# Patient Record
Sex: Female | Born: 1950 | Race: White | Hispanic: No | Marital: Married | Smoking: Never smoker
Health system: Southern US, Community
[De-identification: ages and names within clinical notes are randomized; demographics above are authoritative.]

## PROBLEM LIST (undated history)

## (undated) DIAGNOSIS — T4145XA Adverse effect of unspecified anesthetic, initial encounter: Secondary | ICD-10-CM

## (undated) DIAGNOSIS — Z5111 Encounter for antineoplastic chemotherapy: Secondary | ICD-10-CM

## (undated) DIAGNOSIS — T8859XA Other complications of anesthesia, initial encounter: Secondary | ICD-10-CM

## (undated) DIAGNOSIS — J3089 Other allergic rhinitis: Secondary | ICD-10-CM

## (undated) DIAGNOSIS — N83209 Unspecified ovarian cyst, unspecified side: Secondary | ICD-10-CM

## (undated) DIAGNOSIS — B370 Candidal stomatitis: Secondary | ICD-10-CM

## (undated) DIAGNOSIS — F419 Anxiety disorder, unspecified: Secondary | ICD-10-CM

## (undated) DIAGNOSIS — Z9889 Other specified postprocedural states: Secondary | ICD-10-CM

## (undated) DIAGNOSIS — I7300 Raynaud's syndrome without gangrene: Secondary | ICD-10-CM

## (undated) DIAGNOSIS — M1990 Unspecified osteoarthritis, unspecified site: Secondary | ICD-10-CM

## (undated) DIAGNOSIS — R05 Cough: Principal | ICD-10-CM

## (undated) DIAGNOSIS — Z8489 Family history of other specified conditions: Secondary | ICD-10-CM

## (undated) DIAGNOSIS — R112 Nausea with vomiting, unspecified: Secondary | ICD-10-CM

## (undated) DIAGNOSIS — C801 Malignant (primary) neoplasm, unspecified: Secondary | ICD-10-CM

## (undated) HISTORY — DX: Candidal stomatitis: B37.0

## (undated) HISTORY — DX: Cough: R05

## (undated) HISTORY — DX: Encounter for antineoplastic chemotherapy: Z51.11

## (undated) HISTORY — PX: BREAST EXCISIONAL BIOPSY: SUR124

## (undated) HISTORY — PX: OTHER SURGICAL HISTORY: SHX169

---

## 2007-10-05 ENCOUNTER — Ambulatory Visit (HOSPITAL_COMMUNITY): Admission: RE | Admit: 2007-10-05 | Discharge: 2007-10-05 | Payer: Self-pay | Admitting: Family Medicine

## 2008-03-17 ENCOUNTER — Other Ambulatory Visit: Admission: RE | Admit: 2008-03-17 | Discharge: 2008-03-17 | Payer: Self-pay | Admitting: Family Medicine

## 2008-10-06 ENCOUNTER — Ambulatory Visit (HOSPITAL_COMMUNITY): Admission: RE | Admit: 2008-10-06 | Discharge: 2008-10-06 | Payer: Self-pay | Admitting: Family Medicine

## 2009-07-25 ENCOUNTER — Encounter: Admission: RE | Admit: 2009-07-25 | Discharge: 2009-07-25 | Payer: Self-pay | Admitting: Family Medicine

## 2009-10-21 ENCOUNTER — Ambulatory Visit (HOSPITAL_COMMUNITY): Admission: RE | Admit: 2009-10-21 | Discharge: 2009-10-21 | Payer: Self-pay | Admitting: Family Medicine

## 2009-12-29 ENCOUNTER — Encounter: Admission: RE | Admit: 2009-12-29 | Discharge: 2010-03-11 | Payer: Self-pay | Admitting: Family Medicine

## 2010-03-29 ENCOUNTER — Other Ambulatory Visit: Admission: RE | Admit: 2010-03-29 | Discharge: 2010-03-29 | Payer: Self-pay | Admitting: Family Medicine

## 2010-07-04 ENCOUNTER — Encounter: Payer: Self-pay | Admitting: Geriatric Medicine

## 2010-09-23 ENCOUNTER — Other Ambulatory Visit (HOSPITAL_COMMUNITY): Payer: Self-pay | Admitting: Family Medicine

## 2010-09-23 DIAGNOSIS — Z1231 Encounter for screening mammogram for malignant neoplasm of breast: Secondary | ICD-10-CM

## 2010-11-03 ENCOUNTER — Ambulatory Visit (HOSPITAL_COMMUNITY): Payer: BC Managed Care – PPO

## 2010-11-11 ENCOUNTER — Ambulatory Visit (HOSPITAL_COMMUNITY)
Admission: RE | Admit: 2010-11-11 | Discharge: 2010-11-11 | Disposition: A | Discharge status: Home / Self Care | Payer: BC Managed Care – PPO | Source: Ambulatory Visit | Attending: Family Medicine | Admitting: Family Medicine

## 2010-11-11 DIAGNOSIS — Z1231 Encounter for screening mammogram for malignant neoplasm of breast: Secondary | ICD-10-CM

## 2010-11-17 ENCOUNTER — Ambulatory Visit: Payer: BC Managed Care – PPO | Attending: Family Medicine | Admitting: Physical Therapy

## 2010-11-17 DIAGNOSIS — M25519 Pain in unspecified shoulder: Secondary | ICD-10-CM | POA: Insufficient documentation

## 2010-11-17 DIAGNOSIS — M6281 Muscle weakness (generalized): Secondary | ICD-10-CM | POA: Insufficient documentation

## 2010-11-17 DIAGNOSIS — IMO0001 Reserved for inherently not codable concepts without codable children: Secondary | ICD-10-CM | POA: Insufficient documentation

## 2010-11-17 DIAGNOSIS — M25619 Stiffness of unspecified shoulder, not elsewhere classified: Secondary | ICD-10-CM | POA: Insufficient documentation

## 2010-11-22 ENCOUNTER — Ambulatory Visit: Payer: BC Managed Care – PPO | Admitting: Physical Therapy

## 2010-11-24 ENCOUNTER — Ambulatory Visit: Payer: BC Managed Care – PPO | Admitting: Physical Therapy

## 2010-12-03 ENCOUNTER — Ambulatory Visit: Payer: BC Managed Care – PPO | Admitting: Physical Therapy

## 2010-12-07 ENCOUNTER — Ambulatory Visit: Payer: BC Managed Care – PPO | Admitting: Physical Therapy

## 2010-12-10 ENCOUNTER — Encounter: Payer: BC Managed Care – PPO | Admitting: Physical Therapy

## 2010-12-30 ENCOUNTER — Other Ambulatory Visit: Payer: Self-pay | Admitting: Orthopedic Surgery

## 2010-12-30 DIAGNOSIS — M25511 Pain in right shoulder: Secondary | ICD-10-CM

## 2011-01-01 ENCOUNTER — Ambulatory Visit
Admission: RE | Admit: 2011-01-01 | Discharge: 2011-01-01 | Disposition: A | Discharge status: Home / Self Care | Payer: BC Managed Care – PPO | Source: Ambulatory Visit | Attending: Orthopedic Surgery | Admitting: Orthopedic Surgery

## 2011-01-01 DIAGNOSIS — M25511 Pain in right shoulder: Secondary | ICD-10-CM

## 2011-10-10 ENCOUNTER — Other Ambulatory Visit (HOSPITAL_COMMUNITY): Payer: Self-pay | Admitting: Family Medicine

## 2011-10-10 DIAGNOSIS — Z1231 Encounter for screening mammogram for malignant neoplasm of breast: Secondary | ICD-10-CM

## 2011-11-14 ENCOUNTER — Ambulatory Visit (HOSPITAL_COMMUNITY)
Admission: RE | Admit: 2011-11-14 | Discharge: 2011-11-14 | Disposition: A | Discharge status: Home / Self Care | Payer: BC Managed Care – PPO | Source: Ambulatory Visit | Attending: Family Medicine | Admitting: Family Medicine

## 2011-11-14 DIAGNOSIS — Z1231 Encounter for screening mammogram for malignant neoplasm of breast: Secondary | ICD-10-CM | POA: Insufficient documentation

## 2011-12-27 ENCOUNTER — Other Ambulatory Visit: Payer: Self-pay | Admitting: Dermatology

## 2012-03-21 ENCOUNTER — Other Ambulatory Visit: Payer: Self-pay | Admitting: Dermatology

## 2012-08-24 ENCOUNTER — Other Ambulatory Visit: Payer: Self-pay | Admitting: Gastroenterology

## 2012-08-27 ENCOUNTER — Encounter (HOSPITAL_COMMUNITY): Payer: Self-pay | Admitting: *Deleted

## 2012-08-28 ENCOUNTER — Encounter (HOSPITAL_COMMUNITY): Payer: Self-pay | Admitting: Pharmacy Technician

## 2012-09-03 ENCOUNTER — Ambulatory Visit (HOSPITAL_COMMUNITY)
Admission: RE | Admit: 2012-09-03 | Payer: BC Managed Care – PPO | Source: Ambulatory Visit | Admitting: Gastroenterology

## 2012-09-03 HISTORY — DX: Unspecified osteoarthritis, unspecified site: M19.90

## 2012-09-03 SURGERY — COLONOSCOPY WITH PROPOFOL
Anesthesia: Monitor Anesthesia Care

## 2012-09-11 ENCOUNTER — Other Ambulatory Visit: Payer: Self-pay | Admitting: Gastroenterology

## 2012-09-12 ENCOUNTER — Encounter (HOSPITAL_COMMUNITY): Payer: Self-pay | Admitting: *Deleted

## 2012-09-19 ENCOUNTER — Encounter (HOSPITAL_COMMUNITY): Payer: Self-pay | Admitting: Pharmacy Technician

## 2012-09-27 ENCOUNTER — Ambulatory Visit (HOSPITAL_COMMUNITY): Payer: BC Managed Care – PPO | Admitting: Anesthesiology

## 2012-09-27 ENCOUNTER — Encounter (HOSPITAL_COMMUNITY)
Admission: RE | Disposition: A | Discharge status: Home / Self Care | Payer: Self-pay | Source: Ambulatory Visit | Attending: Gastroenterology

## 2012-09-27 ENCOUNTER — Ambulatory Visit (HOSPITAL_COMMUNITY)
Admission: RE | Admit: 2012-09-27 | Discharge: 2012-09-27 | Disposition: A | Discharge status: Home / Self Care | Payer: BC Managed Care – PPO | Source: Ambulatory Visit | Attending: Gastroenterology | Admitting: Gastroenterology

## 2012-09-27 ENCOUNTER — Encounter (HOSPITAL_COMMUNITY): Payer: Self-pay

## 2012-09-27 ENCOUNTER — Encounter (HOSPITAL_COMMUNITY): Payer: Self-pay | Admitting: Anesthesiology

## 2012-09-27 DIAGNOSIS — Z881 Allergy status to other antibiotic agents status: Secondary | ICD-10-CM | POA: Insufficient documentation

## 2012-09-27 DIAGNOSIS — L719 Rosacea, unspecified: Secondary | ICD-10-CM | POA: Insufficient documentation

## 2012-09-27 DIAGNOSIS — Z1211 Encounter for screening for malignant neoplasm of colon: Secondary | ICD-10-CM | POA: Insufficient documentation

## 2012-09-27 DIAGNOSIS — Z880 Allergy status to penicillin: Secondary | ICD-10-CM | POA: Insufficient documentation

## 2012-09-27 DIAGNOSIS — Z884 Allergy status to anesthetic agent status: Secondary | ICD-10-CM | POA: Insufficient documentation

## 2012-09-27 DIAGNOSIS — M899 Disorder of bone, unspecified: Secondary | ICD-10-CM | POA: Insufficient documentation

## 2012-09-27 DIAGNOSIS — J309 Allergic rhinitis, unspecified: Secondary | ICD-10-CM | POA: Insufficient documentation

## 2012-09-27 DIAGNOSIS — M949 Disorder of cartilage, unspecified: Secondary | ICD-10-CM | POA: Insufficient documentation

## 2012-09-27 DIAGNOSIS — Z888 Allergy status to other drugs, medicaments and biological substances status: Secondary | ICD-10-CM | POA: Insufficient documentation

## 2012-09-27 HISTORY — DX: Other complications of anesthesia, initial encounter: T88.59XA

## 2012-09-27 HISTORY — DX: Adverse effect of unspecified anesthetic, initial encounter: T41.45XA

## 2012-09-27 HISTORY — PX: COLONOSCOPY WITH PROPOFOL: SHX5780

## 2012-09-27 SURGERY — COLONOSCOPY WITH PROPOFOL
Anesthesia: Monitor Anesthesia Care

## 2012-09-27 MED ORDER — ONDANSETRON HCL 4 MG/2ML IJ SOLN
INTRAMUSCULAR | Status: DC | PRN
  Administered 2012-09-27: 4 mg via INTRAVENOUS

## 2012-09-27 MED ORDER — SODIUM CHLORIDE 0.9 % IV SOLN: INTRAVENOUS | Status: DC

## 2012-09-27 MED ORDER — LACTATED RINGERS IV SOLN
INTRAVENOUS | Status: DC
  Administered 2012-09-27: 1000 mL via INTRAVENOUS

## 2012-09-27 MED ORDER — FENTANYL CITRATE 0.05 MG/ML IJ SOLN
INTRAMUSCULAR | Status: DC | PRN
  Administered 2012-09-27 (×2): 50 ug via INTRAVENOUS

## 2012-09-27 MED ORDER — PROMETHAZINE HCL 25 MG/ML IJ SOLN: 6.2500 mg | INTRAMUSCULAR | Status: DC | PRN

## 2012-09-27 MED ORDER — MIDAZOLAM HCL 5 MG/5ML IJ SOLN
INTRAMUSCULAR | Status: DC | PRN
  Administered 2012-09-27 (×2): 1 mg via INTRAVENOUS

## 2012-09-27 MED ORDER — METOCLOPRAMIDE HCL 5 MG/ML IJ SOLN
INTRAMUSCULAR | Status: DC | PRN
  Administered 2012-09-27: 5 mg via INTRAVENOUS

## 2012-09-27 MED ORDER — PROPOFOL 10 MG/ML IV EMUL
INTRAVENOUS | Status: DC | PRN
  Administered 2012-09-27: 100 ug/kg/min via INTRAVENOUS

## 2012-09-27 SURGICAL SUPPLY — 21 items

## 2012-09-27 NOTE — Anesthesia Postprocedure Evaluation (Signed)
Anesthesia Post Note  Patient: [name] West  Procedure(s) Performed: Procedure(s) (LRB): COLONOSCOPY WITH PROPOFOL (N/A)  Anesthesia type: MAC  Patient location: PACU  Post pain: Pain level controlled  Post assessment: Post-op Vital signs reviewed  Last Vitals:  Filed Vitals:   09/27/12 1106  BP: 94/55  Temp: 36.4 C  Resp: 12    Post vital signs: Reviewed  Level of consciousness: sedated  Complications: No apparent anesthesia complications

## 2012-09-27 NOTE — Transfer of Care (Signed)
Immediate Anesthesia Transfer of Care Note  Patient: [name] West  Procedure(s) Performed: Procedure(s) (LRB): COLONOSCOPY WITH PROPOFOL (N/A)  Patient Location: PACU  Anesthesia Type: MAC  Level of Consciousness: sedated, patient cooperative and responds to stimulaton  Airway & Oxygen Therapy: Patient Spontanous Breathing and Patient connected to face mask oxgen  Post-op Assessment: Report given to PACU RN and Post -op Vital signs reviewed and stable  Post vital signs: Reviewed and stable  Complications: No apparent anesthesia complications

## 2012-09-27 NOTE — Anesthesia Preprocedure Evaluation (Addendum)
Anesthesia Evaluation  Patient identified by MRN, date of birth, ID band Patient awake    Reviewed: Allergy & Precautions, H&P , NPO status , Patient's Chart, lab work & pertinent test results  Airway Mallampati: II TM Distance: >3 FB Neck ROM: Full    Dental  (+) Teeth Intact and Dental Advisory Given   Pulmonary neg pulmonary ROS,  breath sounds clear to auscultation  Pulmonary exam normal       Cardiovascular negative cardio ROS  Rhythm:Regular Rate:Normal     Neuro/Psych negative neurological ROS  negative psych ROS   GI/Hepatic negative GI ROS, Neg liver ROS,   Endo/Other  negative endocrine ROS  Renal/GU negative Renal ROS  negative genitourinary   Musculoskeletal  (+) Arthritis -,   Abdominal   Peds  Hematology negative hematology ROS (+)   Anesthesia Other Findings   Reproductive/Obstetrics                           Anesthesia Physical Anesthesia Plan  ASA: I  Anesthesia Plan: MAC   Post-op Pain Management:    Induction: Intravenous  Airway Management Planned: Simple Face Mask  Additional Equipment:   Intra-op Plan:   Post-operative Plan:   Informed Consent: I have reviewed the patients History and Physical, chart, labs and discussed the procedure including the risks, benefits and alternatives for the proposed anesthesia with the patient or authorized representative who has indicated his/her understanding and acceptance.   Dental advisory given  Plan Discussed with: CRNA  Anesthesia Plan Comments:         Anesthesia Quick Evaluation

## 2012-09-27 NOTE — H&P (Signed)
Procedure: Screening colonoscopy.  History: The patient is a 62 year old female born January 15, 1951. The patient is scheduled to undergo a screening colonoscopy with polypectomy to colon cancer using propofol sedation.   Medication allergies: Penicillin causes anaphylaxis. Sulfa drugs cause a rash. Cephalexin causes anaphylaxis. Lidocaine causes rash. Sudafed causes palpitations.  Chronic medications: Boniva. Nasonex. Vitamin D. Allegra. Citalopram.  Past medical and surgical history: Allergic rhinitis. Osteopenia. Rosacea. Endometrial ablation. Right shoulder surgery.  Habits: The patient has never smoked cigarettes. He consumes alcohol in moderation.  Exam: The patient is alert and lying comfortably on the endoscopy stretcher. Sclera are nonicteric. Mouth and throat appear normal. Lungs are clear to auscultation. Cardiac exam reveals a regular rhythm.  Plan: Proceed with screening colonoscopy using propofol sedation.

## 2012-09-27 NOTE — Op Note (Signed)
Procedure: Screening colonoscopy.  Endoscopist: Johnson  Premedication: Propofol administered by anesthesia  Procedure: The patient was placed in the left lateral decubitus position. Anal inspection and digital rectal exam were normal. The Pentax pediatric colonoscope was introduced into the rectum and advanced to the cecum. A normal-appearing ileocecal valve and appendiceal orifice were identified. Colonic preparation for the exam today was fair. Advancement of the colonoscope around the length of of the colon was extremely difficult due to significant loop formation.  Rectum. Normal. I was unable to retroflex the scope in the rectum to examine the distal rectum.  Sigmoid colon and descending colon. Normal.  Splenic flexure. Normal.  Transverse colon. Normal.  Hepatic flexure. Normal.  Ascending colon. Normal.  Cecum and ileocecal valve. Normal.  Assessment: Normal screening proctocolonoscopy to the cecum in a suboptimally prepped colon. The patient was unable to consume all of the Suprep.  Recommendations: Schedule repeat screening colonoscopy in 5 years.

## 2012-09-28 ENCOUNTER — Encounter (HOSPITAL_COMMUNITY): Payer: Self-pay | Admitting: Gastroenterology

## 2012-10-08 ENCOUNTER — Other Ambulatory Visit: Payer: Self-pay

## 2012-10-08 DIAGNOSIS — Z1231 Encounter for screening mammogram for malignant neoplasm of breast: Secondary | ICD-10-CM

## 2012-11-14 ENCOUNTER — Ambulatory Visit
Admission: RE | Admit: 2012-11-14 | Discharge: 2012-11-14 | Disposition: A | Discharge status: Home / Self Care | Payer: BC Managed Care – PPO | Source: Ambulatory Visit

## 2012-11-14 DIAGNOSIS — Z1231 Encounter for screening mammogram for malignant neoplasm of breast: Secondary | ICD-10-CM

## 2013-01-01 ENCOUNTER — Other Ambulatory Visit: Payer: Self-pay | Admitting: Dermatology

## 2013-09-11 ENCOUNTER — Ambulatory Visit: Payer: BC Managed Care – PPO | Attending: Family Medicine | Admitting: Physical Therapy

## 2013-09-11 DIAGNOSIS — M719 Bursopathy, unspecified: Secondary | ICD-10-CM | POA: Insufficient documentation

## 2013-09-11 DIAGNOSIS — IMO0001 Reserved for inherently not codable concepts without codable children: Secondary | ICD-10-CM | POA: Insufficient documentation

## 2013-09-11 DIAGNOSIS — R5381 Other malaise: Secondary | ICD-10-CM | POA: Insufficient documentation

## 2013-09-11 DIAGNOSIS — M25519 Pain in unspecified shoulder: Secondary | ICD-10-CM | POA: Insufficient documentation

## 2013-09-11 DIAGNOSIS — M67919 Unspecified disorder of synovium and tendon, unspecified shoulder: Secondary | ICD-10-CM | POA: Insufficient documentation

## 2013-09-20 ENCOUNTER — Encounter: Payer: BC Managed Care – PPO | Admitting: Physical Therapy

## 2013-09-20 ENCOUNTER — Ambulatory Visit: Payer: BC Managed Care – PPO | Admitting: Physical Therapy

## 2013-09-24 ENCOUNTER — Ambulatory Visit: Payer: BC Managed Care – PPO | Admitting: Physical Therapy

## 2013-09-25 ENCOUNTER — Encounter: Payer: BC Managed Care – PPO | Admitting: Physical Therapy

## 2013-09-27 ENCOUNTER — Encounter: Payer: BC Managed Care – PPO | Admitting: Physical Therapy

## 2013-10-02 ENCOUNTER — Ambulatory Visit: Payer: BC Managed Care – PPO | Admitting: Physical Therapy

## 2013-10-02 ENCOUNTER — Encounter: Payer: BC Managed Care – PPO | Admitting: Physical Therapy

## 2013-10-04 ENCOUNTER — Ambulatory Visit: Payer: BC Managed Care – PPO | Admitting: Physical Therapy

## 2013-10-04 ENCOUNTER — Encounter: Payer: BC Managed Care – PPO | Admitting: Physical Therapy

## 2013-10-08 ENCOUNTER — Ambulatory Visit: Payer: BC Managed Care – PPO | Admitting: Physical Therapy

## 2013-10-09 ENCOUNTER — Encounter: Payer: BC Managed Care – PPO | Admitting: Physical Therapy

## 2013-10-11 ENCOUNTER — Encounter: Payer: BC Managed Care – PPO | Admitting: Physical Therapy

## 2013-10-11 ENCOUNTER — Ambulatory Visit: Payer: BC Managed Care – PPO | Admitting: Physical Therapy

## 2013-10-14 ENCOUNTER — Ambulatory Visit: Payer: BC Managed Care – PPO | Admitting: Physical Therapy

## 2013-10-16 ENCOUNTER — Encounter: Payer: BC Managed Care – PPO | Admitting: Physical Therapy

## 2013-10-18 ENCOUNTER — Encounter: Payer: BC Managed Care – PPO | Admitting: Physical Therapy

## 2013-10-23 ENCOUNTER — Ambulatory Visit: Payer: BC Managed Care – PPO | Admitting: Physical Therapy

## 2013-10-24 ENCOUNTER — Other Ambulatory Visit: Payer: Self-pay

## 2013-10-24 DIAGNOSIS — Z1231 Encounter for screening mammogram for malignant neoplasm of breast: Secondary | ICD-10-CM

## 2013-11-19 ENCOUNTER — Ambulatory Visit: Payer: BC Managed Care – PPO

## 2013-11-27 ENCOUNTER — Ambulatory Visit: Payer: BC Managed Care – PPO

## 2013-12-04 ENCOUNTER — Ambulatory Visit
Admission: RE | Admit: 2013-12-04 | Discharge: 2013-12-04 | Disposition: A | Discharge status: Home / Self Care | Payer: BC Managed Care – PPO | Source: Ambulatory Visit

## 2013-12-04 DIAGNOSIS — Z1231 Encounter for screening mammogram for malignant neoplasm of breast: Secondary | ICD-10-CM

## 2014-06-13 DIAGNOSIS — C801 Malignant (primary) neoplasm, unspecified: Secondary | ICD-10-CM

## 2014-06-13 HISTORY — DX: Malignant (primary) neoplasm, unspecified: C80.1

## 2014-11-07 ENCOUNTER — Other Ambulatory Visit: Payer: Self-pay

## 2014-11-07 DIAGNOSIS — Z1231 Encounter for screening mammogram for malignant neoplasm of breast: Secondary | ICD-10-CM

## 2014-12-08 ENCOUNTER — Ambulatory Visit
Admission: RE | Admit: 2014-12-08 | Discharge: 2014-12-08 | Disposition: A | Discharge status: Home / Self Care | Payer: BC Managed Care – PPO | Source: Ambulatory Visit

## 2014-12-08 DIAGNOSIS — Z1231 Encounter for screening mammogram for malignant neoplasm of breast: Secondary | ICD-10-CM

## 2015-04-28 ENCOUNTER — Other Ambulatory Visit: Payer: Self-pay | Admitting: Family Medicine

## 2015-04-28 ENCOUNTER — Other Ambulatory Visit (HOSPITAL_COMMUNITY)
Admission: RE | Admit: 2015-04-28 | Discharge: 2015-04-28 | Disposition: A | Discharge status: Home / Self Care | Payer: BC Managed Care – PPO | Source: Ambulatory Visit | Attending: Family Medicine | Admitting: Family Medicine

## 2015-04-28 DIAGNOSIS — Z124 Encounter for screening for malignant neoplasm of cervix: Secondary | ICD-10-CM | POA: Diagnosis not present

## 2015-04-28 DIAGNOSIS — R590 Localized enlarged lymph nodes: Secondary | ICD-10-CM

## 2015-04-29 LAB — CYTOLOGY - PAP

## 2015-05-05 ENCOUNTER — Ambulatory Visit
Admission: RE | Admit: 2015-05-05 | Discharge: 2015-05-05 | Disposition: A | Discharge status: Home / Self Care | Payer: BC Managed Care – PPO | Source: Ambulatory Visit | Attending: Family Medicine | Admitting: Family Medicine

## 2015-05-05 ENCOUNTER — Other Ambulatory Visit: Payer: Self-pay | Admitting: Family Medicine

## 2015-05-05 DIAGNOSIS — R590 Localized enlarged lymph nodes: Secondary | ICD-10-CM

## 2015-06-21 ENCOUNTER — Emergency Department (HOSPITAL_BASED_OUTPATIENT_CLINIC_OR_DEPARTMENT_OTHER)
Admission: EM | Admit: 2015-06-21 | Discharge: 2015-06-21 | Disposition: A | Discharge status: Home / Self Care | Payer: BC Managed Care – PPO | Attending: Emergency Medicine | Admitting: Emergency Medicine

## 2015-06-21 ENCOUNTER — Emergency Department (HOSPITAL_BASED_OUTPATIENT_CLINIC_OR_DEPARTMENT_OTHER): Payer: BC Managed Care – PPO

## 2015-06-21 ENCOUNTER — Encounter (HOSPITAL_BASED_OUTPATIENT_CLINIC_OR_DEPARTMENT_OTHER): Payer: Self-pay | Admitting: *Deleted

## 2015-06-21 DIAGNOSIS — R0789 Other chest pain: Secondary | ICD-10-CM | POA: Insufficient documentation

## 2015-06-21 DIAGNOSIS — R079 Chest pain, unspecified: Secondary | ICD-10-CM | POA: Diagnosis present

## 2015-06-21 DIAGNOSIS — R52 Pain, unspecified: Secondary | ICD-10-CM

## 2015-06-21 DIAGNOSIS — Z880 Allergy status to penicillin: Secondary | ICD-10-CM | POA: Insufficient documentation

## 2015-06-21 DIAGNOSIS — Z8742 Personal history of other diseases of the female genital tract: Secondary | ICD-10-CM | POA: Diagnosis not present

## 2015-06-21 DIAGNOSIS — R918 Other nonspecific abnormal finding of lung field: Secondary | ICD-10-CM

## 2015-06-21 DIAGNOSIS — Z8739 Personal history of other diseases of the musculoskeletal system and connective tissue: Secondary | ICD-10-CM | POA: Insufficient documentation

## 2015-06-21 DIAGNOSIS — Z79899 Other long term (current) drug therapy: Secondary | ICD-10-CM | POA: Diagnosis not present

## 2015-06-21 HISTORY — DX: Unspecified ovarian cyst, unspecified side: N83.209

## 2015-06-21 LAB — CBC WITH DIFFERENTIAL/PLATELET
Basophils Absolute: 0 10*3/uL (ref 0.0–0.1)
Basophils Relative: 0 %
Eosinophils Absolute: 0 10*3/uL (ref 0.0–0.7)
Eosinophils Relative: 0 %
HCT: 42.1 % (ref 36.0–46.0)
Hemoglobin: 14 g/dL (ref 12.0–15.0)
Lymphocytes Relative: 23 %
Lymphs Abs: 2.2 10*3/uL (ref 0.7–4.0)
MCH: 30.4 pg (ref 26.0–34.0)
MCHC: 33.3 g/dL (ref 30.0–36.0)
MCV: 91.3 fL (ref 78.0–100.0)
Monocytes Absolute: 0.7 10*3/uL (ref 0.1–1.0)
Monocytes Relative: 7 %
Neutro Abs: 6.6 10*3/uL (ref 1.7–7.7)
Neutrophils Relative %: 70 %
Platelets: 248 10*3/uL (ref 150–400)
RBC: 4.61 MIL/uL (ref 3.87–5.11)
RDW: 12.8 % (ref 11.5–15.5)
WBC: 9.6 10*3/uL (ref 4.0–10.5)

## 2015-06-21 LAB — BASIC METABOLIC PANEL
Anion gap: 7 (ref 5–15)
BUN: 18 mg/dL (ref 6–20)
CO2: 29 mmol/L (ref 22–32)
Calcium: 9 mg/dL (ref 8.9–10.3)
Chloride: 103 mmol/L (ref 101–111)
Creatinine, Ser: 0.74 mg/dL (ref 0.44–1.00)
GFR calc Af Amer: >60 mL/min (ref >60)
GFR calc non Af Amer: >60 mL/min (ref >60)
Glucose, Bld: 101 mg/dL — ABNORMAL HIGH (ref 65–99)
Potassium: 4.2 mmol/L (ref 3.5–5.1)
Sodium: 139 mmol/L (ref 135–145)

## 2015-06-21 LAB — TROPONIN I
Troponin I: <0.03 ng/mL (ref <0.031)
Troponin I: <0.03 ng/mL (ref <0.031)

## 2015-06-21 MED ORDER — DIPHENHYDRAMINE HCL 50 MG/ML IJ SOLN
50.0000 mg | Freq: Once | INTRAMUSCULAR | Status: AC
  Administered 2015-06-21: 50 mg via INTRAVENOUS
  Filled 2015-06-21: qty 1

## 2015-06-21 MED ORDER — METHYLPREDNISOLONE SODIUM SUCC 125 MG IJ SOLR
125.0000 mg | Freq: Once | INTRAMUSCULAR | Status: AC
  Administered 2015-06-21: 125 mg via INTRAVENOUS
  Filled 2015-06-21: qty 2

## 2015-06-21 MED ORDER — IOHEXOL 300 MG/ML  SOLN
75.0000 mL | Freq: Once | INTRAMUSCULAR | Status: AC | PRN
  Administered 2015-06-21: 75 mL via INTRAVENOUS

## 2015-06-21 MED ORDER — AZITHROMYCIN 250 MG PO TABS: ORAL_TABLET | ORAL | Status: DC

## 2015-06-21 NOTE — Discharge Instructions (Signed)
Please follow with your primary care doctor in the next 2 days for a check-up. They must obtain records for further management.  ° °Do not hesitate to return to the Emergency Department for any new, worsening or concerning symptoms.  ° °

## 2015-06-21 NOTE — ED Notes (Signed)
Pt states 2/10 pain scale, and says blood pressure is normally 100's/60's at PCP

## 2015-06-21 NOTE — ED Notes (Signed)
Pt ambulated to restroom with no assistance. ?

## 2015-06-21 NOTE — ED Notes (Signed)
Pt has hx of bursitis in left shoulder and has pain with that. Reports pain in left side of chest x 1 week that is different- [place]

## 2015-06-21 NOTE — ED Provider Notes (Signed)
CSN: [id number]     Arrival date & time 06/21/15  1247 History   First MD Initiated Contact with Patient 06/21/15 1306     Chief Complaint  Patient presents with  . Chest Pain     (Consider location/radiation/quality/duration/timing/severity/associated sxs/prior Treatment) HPI   Blood pressure 129/81, pulse 78, temperature 98.4 F (36.9 C), temperature source Oral, resp. rate 18, height '4\' 11"'$  (1.499 m), weight 40.824 kg, SpO2 100 %.  [name] West is a 65 y.o. female complaining of aching 2 out of 10 diffuse anterior and midaxillary left-sided chest pain onset 1 week ago exacerbated by movement and occasionally by palpation, she took a baby aspirin 2 days ago with some relief. Patient denies fever, chills, cough, shortness of breath, dyspnea on exertion, increasing peripheral edema, exogenous estrogen, calf pain or leg swelling, treatment for cancer/chemotherapy, calf pain, leg swelling. She had a extended flight from [place] back to the [place] but this was 2 months ago. Denies history of DVT/PE, tobacco use, diabetes, hypertension, hyperlipidemia, palpitations, syncope. Pain has been constant since onset with occasional increases in severity.   Past Medical History  Diagnosis Date  . Arthritis     shoulders  . Complication of anesthesia     nausea  . Ovarian cyst    Past Surgical History  Procedure Laterality Date  . Right rotator cuff    . Cesarean section  1984  . Fibroid removed    . Colonoscopy with propofol N/A 09/27/2012    Procedure: COLONOSCOPY WITH PROPOFOL;  Surgeon: Garlan Fair, MD;  Location: [facility];  Service: Endoscopy;  Laterality: N/A;   No family history on file. Social History  Substance Use Topics  . Smoking status: Never Smoker   . Smokeless tobacco: Never Used  . Alcohol Use: Yes     Comment: wine 1 glass per night   OB History    No data available     Review of Systems  10 systems reviewed and found to be negative, except as noted in the  HPI.   Allergies  Avelox; Cephalosporins; Penicillins; Omnipaque; Doxycycline; Adhesive; Lidoderm; Pseudoephedrine; and Sulfa antibiotics  Home Medications   Prior to Admission medications   Medication Sig Start Date End Date Taking? Authorizing Provider  Azelaic Acid (FINACEA) 15 % cream Apply 1 application topically 2 (two) times daily. After skin is thoroughly washed and patted dry, gently but thoroughly massage a thin film of azelaic acid cream into the affected area twice daily, in the morning and evening.   Yes Historical Provider, MD  Brimonidine Tartrate (MIRVASO) 0.33 % GEL Apply 1 application topically daily.   Yes Historical Provider, MD  cholecalciferol (VITAMIN D) 1000 UNITS tablet Take 1,000 Units by mouth daily.   Yes Historical Provider, MD  citalopram (CELEXA) 10 MG tablet Take 10 mg by mouth every morning.   Yes Historical Provider, MD  fexofenadine (ALLEGRA) 180 MG tablet Take 180 mg by mouth daily.   Yes Historical Provider, MD  LORazepam (ATIVAN) 0.5 MG tablet Take 0.5 mg by mouth 2 (two) times daily.   Yes Historical Provider, MD  meloxicam (MOBIC) 15 MG tablet Take 15 mg by mouth daily.   Yes Historical Provider, MD  Misc Natural Products (TART CHERRY ADVANCED PO) Take by mouth.   Yes Historical Provider, MD  Multiple Vitamin (MULTIVITAMIN WITH MINERALS) TABS Take 1 tablet by mouth daily.   Yes Historical Provider, MD  zolpidem (AMBIEN) 5 MG tablet Take 5 mg by mouth at bedtime as needed  for sleep.   Yes Historical Provider, MD  azithromycin (ZITHROMAX Z-PAK) 250 MG tablet 2 po day one, then 1 daily x 4 days 06/21/15   Elmyra Ricks Pisciotta, PA-C  ibandronate (BONIVA) 150 MG tablet Take 150 mg by mouth every 30 (thirty) days. Take in the morning with a full glass of water, on an empty stomach, and do not take anything else by mouth or lie down for the next 30 min.    Historical Provider, MD  phenylephrine (SUDAFED PE) 10 MG TABS Take 10 mg by mouth every 4 (four) hours as needed  (for congestion).    Historical Provider, MD   BP 128/84 mmHg  Pulse 88  Temp(Src) 98.4 F (36.9 C) (Oral)  Resp 16  Ht '4\' 11"'$  (1.499 m)  Wt 40.824 kg  BMI 18.17 kg/m2  SpO2 98% Physical Exam  Constitutional: She is oriented to person, place, and time. She appears well-developed and well-nourished. No distress.  HENT:  Head: Normocephalic.  Mouth/Throat: Oropharynx is clear and moist.  Eyes: Conjunctivae and EOM are normal.  Neck: Normal range of motion. No JVD present. No tracheal deviation present.  Cardiovascular: Normal rate, regular rhythm and intact distal pulses.   Radial pulse equal bilaterally  Pulmonary/Chest: Effort normal and breath sounds normal. No stridor. No respiratory distress. She has no wheezes. She has no rales. She exhibits no tenderness.  Abdominal: Soft. She exhibits no distension and no mass. There is no tenderness. There is no rebound and no guarding.  Musculoskeletal: Normal range of motion. She exhibits no edema or tenderness.  No calf asymmetry, superficial collaterals, palpable cords, edema, Homans sign negative bilaterally.    Neurological: She is alert and oriented to person, place, and time.  Skin: Skin is warm. She is not diaphoretic.  Psychiatric: She has a normal mood and affect.  Nursing note and vitals reviewed.   ED Course  Procedures (including critical care time) Labs Review Labs Reviewed  BASIC METABOLIC PANEL - Abnormal; Notable for the following:    Glucose, Bld 101 (*)    All other components within normal limits  CBC WITH DIFFERENTIAL/PLATELET  TROPONIN I  TROPONIN I    Imaging Review Dg Chest 2 View  06/21/2015  CLINICAL DATA:  Left-sided chest pain for 1 week EXAM: CHEST  2 VIEW COMPARISON:  None. FINDINGS: The heart size and vascular pattern are normal. There is no consolidation or effusion. Rounded 19 mm opacity inferior to the articulation of the right first rib and sternum, possibly related to bone hypertrophy. Nodule or  mass not excluded. Mild right and moderate left shoulder arthropathy. IMPRESSION: Consider CT thorax to evaluate possible right upper lobe mass. Electronically Signed   By: Skipper Cliche M.D.   On: 06/21/2015 14:45   Ct Chest W Contrast  06/21/2015  CLINICAL DATA:  Left-sided chest pain.  Abnormal chest radiography. EXAM: CT CHEST WITH CONTRAST TECHNIQUE: Multidetector CT imaging of the chest was performed during intravenous contrast administration. CONTRAST:  24m OMNIPAQUE IOHEXOL 300 MG/ML  SOLN COMPARISON:  Chest radiograph 06/21/2015 FINDINGS: Mediastinum/Lymph Nodes: No masses, pathologically enlarged lymph nodes, or other significant abnormality. Lungs/Pleura: There is a 3.7 x 4.5 x 4.0 cm sub solid pulmonary mass versus area of airspace consolidation in the right upper lobe. Spiculations are seen extending to the anterior and lateral pleural surfaces. Prominent air bronchograms are seen within this finding. Retraction of the interlobar fissure is also seen. The lobar and segmental bronchi are patent. The major vascular structures are normal  in caliber. There is a 6 mm circumscribed soft tissue nodule in the right apex, which may represent an intraparenchymal lymph node. The left lung is clear. Upper abdomen: No acute findings. Musculoskeletal: No chest wall mass or suspicious bone lesions identified. Osteoarthritic changes of bilateral glenohumeral joints with subchondral sclerosis and cyst formation are seen. IMPRESSION: Right upper lobe mixed solid and sub solid mass, causing interlobar fissure retraction and spiculations extending to the pleural surfaces. Differential diagnosis includes primary pulmonary malignancy, with adjacent airspace consolidation versus less likely focal pneumonia. If patient does not demonstrate clinical signs of pneumonia, tissue diagnosis may be considered. 6 mm smoothly marginated soft tissue nodule in the right apex, which may represent an intraparenchymal lymph node.  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 Electronically Signed   By: Fidela Salisbury M.D.   On: 06/21/2015 16:57   I have personally reviewed and evaluated these images and lab results as part of my medical decision-making.   EKG Interpretation None      MDM   Final diagnoses:  Pain  Lung mass  Atypical chest pain    Filed Vitals:   06/21/15 1250 06/21/15 1526 06/21/15 1728 06/21/15 1729  BP: 129/81 135/76 128/84 128/84  Pulse: 78 84 90 88  Temp: 98.4 F (36.9 C)     TempSrc: Oral     Resp: '18 18  16  '$ Height: '4\' 11"'$  (1.499 m)     Weight: 40.824 kg     SpO2: 100% 100% 98% 98%    Medications  methylPREDNISolone sodium succinate (SOLU-MEDROL) 125 mg/2 mL injection 125 mg (125 mg Intravenous Given 06/21/15 1515)  diphenhydrAMINE (BENADRYL) injection 50 mg (50 mg Intravenous Given 06/21/15 1515)  iohexol (OMNIPAQUE) 300 MG/ML solution 75 mL (75 mLs Intravenous Contrast Given 06/21/15 1619)    Denajah Farias is 65 y.o. female presenting with left sided chest pain constant over the course of a week. States is exacerbated with left shoulder movement. There was no trauma. Lung sounds clear to auscultation, patient saturating well on room air, there is no tachypnea or tachycardia. Chest is nontender to palpation. EKG with no ischemic changes. Very low risk factors for PE, history of present illness atypical for PE, I doubt PE.   Chest x-ray with a 19 mm opacity on the right first rib and sternum. This is not in the area of this patient's pain however, would like to further evaluated with CT. Patient had hive-like reaction to contrast in the past. Patient premedicated with Solu-Medrol and Benadryl.  Delta troponin is negative.  Chest CT shows a 4 cm sub-solid pulmonary mass in the right upper lobe. Suspicion for pulmonary malignancy versus, less likely, pneumonia. Discussed findings with patient. Hematology oncology consult from Dr. Lindi Adie appreciated: States he  will call the patient tomorrow to expedite PET, and biopsy this week.   Evaluation does not show pathology that would require ongoing emergent intervention or inpatient treatment. Pt is hemodynamically stable and mentating appropriately. Discussed findings and plan with patient/guardian, who agrees with care plan. All questions answered. Return precautions discussed and outpatient follow up given.   New Prescriptions   AZITHROMYCIN (ZITHROMAX Z-PAK) 250 MG TABLET    2 po day one, then 1 daily x 4 days         Monico Blitz, PA-C 06/21/15 [place], DO 06/21/15 2331

## 2015-06-22 ENCOUNTER — Telehealth: Payer: Self-pay | Admitting: Emergency Medicine

## 2015-06-22 ENCOUNTER — Other Ambulatory Visit: Payer: Self-pay

## 2015-06-22 DIAGNOSIS — R918 Other nonspecific abnormal finding of lung field: Secondary | ICD-10-CM

## 2015-06-22 NOTE — Telephone Encounter (Signed)
I was contacted by Dr Lindi Adie with Oncology regarding patient's abnormal CT chest done in the ED on 06/21/15. There is a semi-solid RUL opacity, etiology unclear, but concerning for malignancy. Also with a hypodense area (? of lymphadenopathy) in subcarinal region. Review of the ED notes indicates that she has not had any infectious sx or findings to support CAP. A PET has been ordered for 06/25/15. I would like to see her ASAP after the PET to determine best modality for biopsy. Will call radiology and get the Ct scan converted to SuperD cuts.

## 2015-06-22 NOTE — Telephone Encounter (Signed)
Pt now calling her doc said she could call us home # [phone]   [phone]

## 2015-06-22 NOTE — Telephone Encounter (Signed)
lmtcb x1 for [facility].

## 2015-06-23 NOTE — Telephone Encounter (Signed)
Spoke with [facility]'s CT department. They are going to make the Super D disc. RB will have to go and pick it up, he is aware of this. Pt has been scheduled for her consult on 06/30/15 at 9:45am. Nothing further was needed at this time.

## 2015-06-24 ENCOUNTER — Telehealth: Payer: Self-pay | Admitting: *Deleted

## 2015-06-24 ENCOUNTER — Encounter: Payer: Self-pay | Admitting: *Deleted

## 2015-06-24 DIAGNOSIS — C3491 Malignant neoplasm of unspecified part of right bronchus or lung: Secondary | ICD-10-CM | POA: Insufficient documentation

## 2015-06-24 DIAGNOSIS — R918 Other nonspecific abnormal finding of lung field: Secondary | ICD-10-CM

## 2015-06-24 NOTE — Telephone Encounter (Signed)
Call received from "Sarah with [facility] asking Dr. Lindi Adie to sign order for tomorrow's 0830 scan.  We've sent this to his in-basket and it still doesn't have the pin or check that it's been reviewed."      Voicemail with this request left on 07-724 however at this time patient is scheduled as new patient for Dr. Julien Nordmann.  Lung Navigator notified via voicemail.

## 2015-06-24 NOTE — Telephone Encounter (Signed)
Oncology Nurse Navigator Documentation  Oncology Nurse Navigator Flowsheets 06/24/2015  Navigator Location [facility]  Navigator Encounter Type Introductory phone call/I received referral.  I spoke with Dr. Julien Nordmann and he said can see patient Friday after her PET scan.  I called and left her a vm message regarding appt time and place, 07/03/15 arrive at 8:30.  I also left my name and phone number to call if needed.   Treatment Phase Pre-Tx/Tx Discussion  Barriers/Navigation Needs Coordination of Care  Interventions Coordination of Care  Coordination of Care Appts  Acuity Level 1  Time Spent with Patient 15

## 2015-06-24 NOTE — Telephone Encounter (Signed)
I called [facility] to get clarification on needs.  I spoke with out patient scan facility as well as in patient and they were unaware of issues.  I called [facility] scheduling to follow up with them.  I instructed them to call Dr. Agustina Caroli office.  I also notified his office.

## 2015-06-25 ENCOUNTER — Ambulatory Visit
Admission: RE | Admit: 2015-06-25 | Discharge: 2015-06-25 | Disposition: A | Discharge status: Home / Self Care | Payer: BC Managed Care – PPO | Source: Ambulatory Visit | Attending: Hematology and Oncology | Admitting: Hematology and Oncology

## 2015-06-25 DIAGNOSIS — N83202 Unspecified ovarian cyst, left side: Secondary | ICD-10-CM | POA: Diagnosis not present

## 2015-06-25 DIAGNOSIS — R918 Other nonspecific abnormal finding of lung field: Secondary | ICD-10-CM | POA: Diagnosis not present

## 2015-06-25 LAB — GLUCOSE, CAPILLARY: Glucose-Capillary: 87 mg/dL (ref 65–99)

## 2015-06-25 MED ORDER — FLUDEOXYGLUCOSE F - 18 (FDG) INJECTION
12.4600 | Freq: Once | INTRAVENOUS | Status: AC | PRN
  Administered 2015-06-25: 12.46 via INTRAVENOUS

## 2015-06-25 NOTE — Assessment & Plan Note (Signed)
PET-CT 06/24/14: RUL mass without hilar and mediastinal lymphadenopathy CT chest 06/21/15: RUL mass 3.7 x 4.5 x 4 cm with spiculations, 6 mm intraparenchymal LN at Rt apex.  Radiology Review: I reviewed the scans and provided copies of the reports.  Recommendation: 1. Bronchoscopy and biopsy 2. Follow up after biopsy

## 2015-06-26 ENCOUNTER — Encounter: Payer: Self-pay | Admitting: Hematology and Oncology

## 2015-06-26 ENCOUNTER — Ambulatory Visit (HOSPITAL_BASED_OUTPATIENT_CLINIC_OR_DEPARTMENT_OTHER): Payer: BC Managed Care – PPO | Admitting: Hematology and Oncology

## 2015-06-26 VITALS — BP 120/79 | HR 77 | Temp 97.8°F | Resp 17 | Ht 59.0 in | Wt 91.8 lb

## 2015-06-26 DIAGNOSIS — R918 Other nonspecific abnormal finding of lung field: Secondary | ICD-10-CM | POA: Diagnosis not present

## 2015-06-26 NOTE — Progress Notes (Signed)
Unable to get in to room prior to MD visit - no assessment performed.  Medications and allergies reconciled form pt med list provided by MD.

## 2015-06-26 NOTE — Progress Notes (Signed)
Central CONSULT NOTE  Patient Care Team: [name], MD as PCP - General (Family Medicine)  CHIEF COMPLAINTS/PURPOSE OF CONSULTATION:  Newly diagnosed lung mass  HISTORY OF PRESENTING ILLNESS:  [name] West 65 y.o. female is here because of recent diagnosis of right upper lobe lung mass. Patient went to the emergency room complaining of left-sided chest discomfort and chest tightness and underwent a CT of the chest. Incidentally there was a right upper lobe lung lesion with honeycombed appearance that was noted. The emergency room called me regarding her and I arranged an outpatient evaluation with a PET CT scan and an outpatient pulmonary consultation. She is here today to discuss the results of the PET CT scan and to discuss the overall treatment plan. Patient is not a smoker and does not have any exposure to secondhand smoke. She works at [employer] and apparently travels significantly. In November she was in [place] and prior to that in other countries as well. She has not been feeling ill or sick. She denies any cough or expectoration or hemoptysis. She denies any fevers or chills.  I reviewed her records extensively and collaborated the history with the patient.  MEDICAL HISTORY:  Past Medical History  Diagnosis Date  . Arthritis     shoulders  . Complication of anesthesia     nausea  . Ovarian cyst     SURGICAL HISTORY: Past Surgical History  Procedure Laterality Date  . Right rotator cuff    . Cesarean section  1984  . Fibroid removed    . Colonoscopy with propofol N/A 09/27/2012    Procedure: COLONOSCOPY WITH PROPOFOL;  Surgeon: Garlan Fair, MD;  Location: [facility];  Service: Endoscopy;  Laterality: N/A;    SOCIAL HISTORY: Social History   Social History  . Marital Status: Married    Spouse Name: N/A  . Number of Children: N/A  . Years of Education: N/A   Occupational History  . Not on file.   Social History Main Topics  . Smoking status:  Never Smoker   . Smokeless tobacco: Never Used  . Alcohol Use: Yes     Comment: wine 1 glass per night  . Drug Use: No  . Sexual Activity: Not on file   Other Topics Concern  . Not on file   Social History Narrative    FAMILY HISTORY: No family history on file.  ALLERGIES:  is allergic to avelox; cephalosporins; penicillins; omnipaque; doxycycline; adhesive; lidoderm; pseudoephedrine; and sulfa antibiotics.  MEDICATIONS:  Current Outpatient Prescriptions  Medication Sig Dispense Refill  . Azelaic Acid (FINACEA) 15 % cream Apply 1 application topically 2 (two) times daily. After skin is thoroughly washed and patted dry, gently but thoroughly massage a thin film of azelaic acid cream into the affected area twice daily, in the morning and evening.    Marland Kitchen azithromycin (ZITHROMAX Z-PAK) 250 MG tablet 2 po day one, then 1 daily x 4 days 5 tablet 0  . Brimonidine Tartrate (MIRVASO) 0.33 % GEL Apply 1 application topically daily.    . cholecalciferol (VITAMIN D) 1000 UNITS tablet Take 1,000 Units by mouth daily.    . citalopram (CELEXA) 10 MG tablet Take 10 mg by mouth every morning.    . fexofenadine (ALLEGRA) 180 MG tablet Take 180 mg by mouth daily.    Marland Kitchen LORazepam (ATIVAN) 0.5 MG tablet Take 0.5 mg by mouth 2 (two) times daily.    . meloxicam (MOBIC) 15 MG tablet Take 15 mg by  mouth daily.    . Misc Natural Products (TART CHERRY ADVANCED PO) Take by mouth.    . Multiple Vitamin (MULTIVITAMIN WITH MINERALS) TABS Take 1 tablet by mouth daily.    Marland Kitchen zolpidem (AMBIEN) 5 MG tablet Take 5 mg by mouth at bedtime as needed for sleep.    Marland Kitchen ibandronate (BONIVA) 150 MG tablet Take 150 mg by mouth every 30 (thirty) days. Reported on 06/26/2015    . phenylephrine (SUDAFED PE) 10 MG TABS Take 10 mg by mouth every 4 (four) hours as needed (for congestion). Reported on 06/26/2015     No current facility-administered medications for this visit.    REVIEW OF SYSTEMS:   Constitutional: Denies fevers,  chills or abnormal night sweats Eyes: Denies blurriness of vision, double vision or watery eyes Ears, nose, mouth, throat, and face: Denies mucositis or sore throat Respiratory: Denies cough, dyspnea or wheezes Cardiovascular: Denies palpitation, chest discomfort or lower extremity swelling Gastrointestinal:  Denies nausea, heartburn or change in bowel habits Skin: Denies abnormal skin rashes Lymphatics: Denies new lymphadenopathy or easy bruising Neurological:Denies numbness, tingling or new weaknesses Behavioral/Psych: anxiety and stress related to this lung findings  All other systems were reviewed with the patient and are negative.  PHYSICAL EXAMINATION: ECOG PERFORMANCE STATUS: 1 - Symptomatic but completely ambulatory  Filed Vitals:   06/26/15 1211  BP: 120/79  Pulse: 77  Temp: 97.8 F (36.6 C)  Resp: 17   Filed Weights   06/26/15 1211  Weight: 91 lb 12.8 oz (41.64 kg)    GENERAL:alert, no distress and comfortable SKIN: skin color, texture, turgor are normal, no rashes or significant lesions EYES: normal, conjunctiva are pink and non-injected, sclera clear OROPHARYNX:no exudate, no erythema and lips, buccal mucosa, and tongue normal  NECK: supple, thyroid normal size, non-tender, without nodularity LYMPH:  no palpable lymphadenopathy in the cervical, axillary or inguinal LUNGS: clear to auscultation and percussion with normal breathing effort HEART: regular rate & rhythm and no murmurs and no lower extremity edema ABDOMEN:abdomen soft, non-tender and normal bowel sounds Musculoskeletal:no cyanosis of digits and no clubbing  PSYCH: alert & oriented x 3 with fluent speech NEURO: no focal motor/sensory deficits  LABORATORY DATA:  I have reviewed the data as listed Lab Results  Component Value Date   WBC 9.6 06/21/2015   HGB 14.0 06/21/2015   HCT 42.1 06/21/2015   MCV 91.3 06/21/2015   PLT 248 06/21/2015   Lab Results  Component Value Date   NA 139 06/21/2015    K 4.2 06/21/2015   CL 103 06/21/2015   CO2 29 06/21/2015    RADIOGRAPHIC STUDIES: I have personally reviewed the radiological reports and agreed with the findings in the report.  ASSESSMENT AND PLAN:  Lung mass PET-CT 06/24/14: RUL mass without hilar and mediastinal lymphadenopathy CT chest 06/21/15: RUL mass 3.7 x 4.5 x 4 cm with spiculations, 6 mm intraparenchymal LN at Rt apex.  Radiology Review: I reviewed the scans and provided copies of the reports.  Recommendation: 1. Bronchoscopy and biopsy 2. Follow up after biopsy  I discussed with her that the differential diagnosis is between a infection/inflammation/cancer. If it is lung malignancy, we will refer her for cardiothoracic surgery for further evaluation regarding removal of the mass. If it is infection, Dr. Lamonte Sakai will be able to figure out a game plan for treatment of the infection.  Return to clinic based upon the pathology results. All questions were answered. The patient knows to call the clinic with any  problems, questions or concerns.    Rulon Eisenmenger, MD 06/26/2015

## 2015-06-30 ENCOUNTER — Encounter: Payer: Self-pay | Admitting: Emergency Medicine

## 2015-06-30 ENCOUNTER — Ambulatory Visit (INDEPENDENT_AMBULATORY_CARE_PROVIDER_SITE_OTHER): Payer: BC Managed Care – PPO | Admitting: Emergency Medicine

## 2015-06-30 ENCOUNTER — Other Ambulatory Visit (INDEPENDENT_AMBULATORY_CARE_PROVIDER_SITE_OTHER): Payer: BC Managed Care – PPO

## 2015-06-30 VITALS — BP 104/78 | HR 72 | Ht 59.5 in | Wt 94.0 lb

## 2015-06-30 DIAGNOSIS — R918 Other nonspecific abnormal finding of lung field: Secondary | ICD-10-CM | POA: Diagnosis not present

## 2015-06-30 LAB — CBC WITH DIFFERENTIAL/PLATELET
Basophils Absolute: 0 10*3/uL (ref 0.0–0.1)
Basophils Relative: 0.5 % (ref 0.0–3.0)
Eosinophils Absolute: 0.1 10*3/uL (ref 0.0–0.7)
Eosinophils Relative: 0.9 % (ref 0.0–5.0)
HCT: 42.5 % (ref 36.0–46.0)
Hemoglobin: 14.5 g/dL (ref 12.0–15.0)
Lymphocytes Relative: 20.6 % (ref 12.0–46.0)
Lymphs Abs: 1.8 10*3/uL (ref 0.7–4.0)
MCHC: 34.1 g/dL (ref 30.0–36.0)
MCV: 89.1 fl (ref 78.0–100.0)
Monocytes Absolute: 0.8 10*3/uL (ref 0.1–1.0)
Monocytes Relative: 9 % (ref 3.0–12.0)
Neutro Abs: 6.1 10*3/uL (ref 1.4–7.7)
Neutrophils Relative %: 69 % (ref 43.0–77.0)
Platelets: 282 10*3/uL (ref 150.0–400.0)
RBC: 4.77 Mil/uL (ref 3.87–5.11)
RDW: 12.9 % (ref 11.5–15.5)
WBC: 8.9 10*3/uL (ref 4.0–10.5)

## 2015-06-30 NOTE — Assessment & Plan Note (Signed)
Right upper lobe mass noted on CT scan of the chest and with very mild uptake on subsequent PET scan. There is no distant disease and she may be a candidate for surgical resection if this is an adenocarcinoma. Etiology is unclear but I do suspect a slow growing primary lung cancer. She is certainly also at risk for possible atypical infection or for an autoimmune process. I do not believe this is a community acquired pneumonia based on the absence of symptoms. A high-resolution CT scan is available at [facility]. i will set up asap.

## 2015-06-30 NOTE — Progress Notes (Signed)
Subjective:    Patient ID: [name] West, female    DOB: 1950-10-26, 65 y.o.   MRN: [id number]  HPI 65 year old woman, never smoker, with a history of arthritis, ovarian cyst. She was seen in the emergency department for left-sided chest discomfort and underwent a CT scan of the chest 06/21/15 that unfortunately showed a right upper lobe mass with the largest dimension 4.5 cm and associated spiculations. I have reviewed the images personally. This prompted a PET scan performed on 06/25/15. This showed very low level uptake in the right upper lobe mass without any evidence of distant disease. In particular there was no hypermetabolism in the mediastinal lymph nodes. She does have a large ovarian cyst noted on scan.    In retrospect she has had rhinorrhea. No fever, chills, cough or infectious prodrome. Has felt well.     Review of Systems  Constitutional: Negative for fever and unexpected weight change.  HENT: Negative for congestion, dental problem, ear pain, nosebleeds, postnasal drip, rhinorrhea, sinus pressure, sneezing, sore throat and trouble swallowing.   Eyes: Negative for redness and itching.  Respiratory: Negative for cough, chest tightness, shortness of breath and wheezing.   Cardiovascular: Negative for palpitations and leg swelling.  Gastrointestinal: Negative for nausea and vomiting.  Genitourinary: Negative for dysuria.  Musculoskeletal: Negative for joint swelling.  Skin: Negative for rash.  Neurological: Negative for headaches.  Hematological: Does not bruise/bleed easily.  Psychiatric/Behavioral: Negative for dysphoric mood. The patient is not nervous/anxious.    DD you have ordered quinolone Past Medical History  Diagnosis Date  . Arthritis     shoulders  . Complication of anesthesia     nausea  . Ovarian cyst      No family history on file.   Social History   Social History  . Marital Status: Married    Spouse Name: N/A  . Number of Children: N/A  . Years of  Education: N/A   Occupational History  . Not on file.   Social History Main Topics  . Smoking status: Never Smoker   . Smokeless tobacco: Never Used  . Alcohol Use: Yes     Comment: wine 1 glass per night  . Drug Use: No  . Sexual Activity: Not on file   Other Topics Concern  . Not on file   Social History Narrative     Allergies  Allergen Reactions  . Avelox [Moxifloxacin Hcl In Nacl] Anaphylaxis  . Cephalosporins Anaphylaxis  . Penicillins Anaphylaxis  . Omnipaque [Iohexol] Hives  . Doxycycline Other (See Comments)    Frequently urination   . Adhesive [Tape] Rash  . Lidoderm [Lidocaine] Rash  . Pseudoephedrine Palpitations  . Sulfa Antibiotics Rash     Outpatient Prescriptions Prior to Visit  Medication Sig Dispense Refill  . Azelaic Acid (FINACEA) 15 % cream Apply 1 application topically 2 (two) times daily. After skin is thoroughly washed and patted dry, gently but thoroughly massage a thin film of azelaic acid cream into the affected area twice daily, in the morning and evening.    . Brimonidine Tartrate (MIRVASO) 0.33 % GEL Apply 1 application topically daily.    . cholecalciferol (VITAMIN D) 1000 UNITS tablet Take 1,000 Units by mouth daily.    . citalopram (CELEXA) 10 MG tablet Take 10 mg by mouth every morning.    . fexofenadine (ALLEGRA) 180 MG tablet Take 180 mg by mouth daily.    . Misc Natural Products (TART CHERRY ADVANCED PO) Take by mouth.    Marland Kitchen  Multiple Vitamin (MULTIVITAMIN WITH MINERALS) TABS Take 1 tablet by mouth daily.    Marland Kitchen zolpidem (AMBIEN) 5 MG tablet Take 5 mg by mouth at bedtime as needed for sleep.    Marland Kitchen azithromycin (ZITHROMAX Z-PAK) 250 MG tablet 2 po day one, then 1 daily x 4 days 5 tablet 0  . ibandronate (BONIVA) 150 MG tablet Take 150 mg by mouth every 30 (thirty) days. Reported on 06/26/2015    . LORazepam (ATIVAN) 0.5 MG tablet Take 0.5 mg by mouth 2 (two) times daily.    . meloxicam (MOBIC) 15 MG tablet Take 15 mg by mouth daily.    .  phenylephrine (SUDAFED PE) 10 MG TABS Take 10 mg by mouth every 4 (four) hours as needed (for congestion). Reported on 06/26/2015     No facility-administered medications prior to visit.    Travels frequently, no known TB exposure.  She is a Primary school teacher Fungal exposures based [place]  Gardens frequently Lived in [place], [place], [place], [place], [place], [place], [place].        Objective:   Physical Exam Filed Vitals:   06/30/15 0953  BP: 104/78  Pulse: 72  Height: 4' 11.5" (1.511 m)  Weight: 94 lb (42.638 kg)  SpO2: 98%   Gen: Pleasant, well-nourished, in no distress,  normal affect  ENT: No lesions,  mouth clear,  oropharynx clear, no postnasal drip  Neck: No JVD, no TMG, no carotid bruits  Lungs: No use of accessory muscles, clear without rales or rhonchi   Cardiovascular: RRR, heart sounds normal, no murmur or gallops, no peripheral edema  Abdomen: soft and NT, no HSM,  BS normal  Musculoskeletal: No deformities, no cyanosis or clubbing  Neuro: alert, non focal  Skin: Warm, no lesions or rashes      Assessment & Plan:  Lung mass Right upper lobe mass noted on CT scan of the chest and with very mild uptake on subsequent PET scan. There is no distant disease and she may be a candidate for surgical resection if this is an adenocarcinoma. Etiology is unclear but I do suspect a slow growing primary lung cancer. She is certainly also at risk for possible atypical infection or for an autoimmune process. I do not believe this is a community acquired pneumonia based on the absence of symptoms. A high-resolution CT scan is available at [facility]. i will set up asap.

## 2015-06-30 NOTE — Patient Instructions (Signed)
We will arrange for navigational bronchoscopy to sample your right upper lobe lesion. This will be done under general anesthesia at [facility] We will perform TB blood work today for screening Follow with Dr Lamonte Sakai next available after your procedure to review the results.

## 2015-07-02 ENCOUNTER — Telehealth: Payer: Self-pay | Admitting: Emergency Medicine

## 2015-07-02 ENCOUNTER — Other Ambulatory Visit: Payer: Self-pay | Admitting: *Deleted

## 2015-07-02 ENCOUNTER — Encounter: Payer: Self-pay | Admitting: Emergency Medicine

## 2015-07-02 LAB — QUANTIFERON TB GOLD ASSAY (BLOOD)
Interferon Gamma Release Assay: NEGATIVE
Mitogen value: >10 IU/mL
Quantiferon Nil Value: 0.02 IU/mL
Quantiferon Tb Ag Minus Nil Value: 0.01 IU/mL
TB Ag value: 0.03 IU/mL

## 2015-07-02 NOTE — Telephone Encounter (Signed)
These appropriate questions need to be directed to the physician who is doing the proposed procedure- Dr Lamonte Sakai??

## 2015-07-02 NOTE — Telephone Encounter (Signed)
Spoke with pt. She has sent a patient email through [place]. Has several questions about her ENB that is coming up. These questions have been sent to CY since RB is unavailable. She would like for [place] to contact her through [place]. Nothing further was needed. Will close message.

## 2015-07-02 NOTE — Telephone Encounter (Signed)
[phone]  pt calling back

## 2015-07-02 NOTE — Telephone Encounter (Signed)
CY can you please answer these questions? Thanks.

## 2015-07-03 ENCOUNTER — Ambulatory Visit: Payer: BC Managed Care – PPO | Admitting: Internal Medicine

## 2015-07-03 ENCOUNTER — Other Ambulatory Visit: Payer: BC Managed Care – PPO

## 2015-07-06 ENCOUNTER — Encounter: Payer: Self-pay | Admitting: Internal Medicine

## 2015-07-06 ENCOUNTER — Encounter: Payer: Self-pay | Admitting: Hematology and Oncology

## 2015-07-06 NOTE — Progress Notes (Signed)
Insurance form left in box for dr. Lindi Adie. Noted sharepoint.

## 2015-07-06 NOTE — Progress Notes (Signed)
form placed in my box and patient wants call when ready

## 2015-07-09 ENCOUNTER — Encounter: Payer: Self-pay | Admitting: Hematology and Oncology

## 2015-07-09 ENCOUNTER — Encounter (HOSPITAL_COMMUNITY): Payer: Self-pay

## 2015-07-09 ENCOUNTER — Telehealth: Payer: Self-pay | Admitting: *Deleted

## 2015-07-09 ENCOUNTER — Encounter (HOSPITAL_COMMUNITY)
Admission: RE | Admit: 2015-07-09 | Discharge: 2015-07-09 | Disposition: A | Discharge status: Home / Self Care | Payer: BC Managed Care – PPO | Source: Ambulatory Visit | Attending: Emergency Medicine | Admitting: Emergency Medicine

## 2015-07-09 DIAGNOSIS — Z01812 Encounter for preprocedural laboratory examination: Secondary | ICD-10-CM | POA: Insufficient documentation

## 2015-07-09 DIAGNOSIS — N83202 Unspecified ovarian cyst, left side: Secondary | ICD-10-CM | POA: Diagnosis not present

## 2015-07-09 HISTORY — DX: Other specified postprocedural states: Z98.890

## 2015-07-09 HISTORY — DX: Nausea with vomiting, unspecified: R11.2

## 2015-07-09 HISTORY — DX: Other allergic rhinitis: J30.89

## 2015-07-09 HISTORY — DX: Family history of other specified conditions: Z84.89

## 2015-07-09 LAB — CBC
HCT: 42.3 % (ref 36.0–46.0)
Hemoglobin: 14.5 g/dL (ref 12.0–15.0)
MCH: 31.3 pg (ref 26.0–34.0)
MCHC: 34.3 g/dL (ref 30.0–36.0)
MCV: 91.4 fL (ref 78.0–100.0)
Platelets: 236 10*3/uL (ref 150–400)
RBC: 4.63 MIL/uL (ref 3.87–5.11)
RDW: 13.1 % (ref 11.5–15.5)
WBC: 8.2 10*3/uL (ref 4.0–10.5)

## 2015-07-09 LAB — PROTIME-INR
INR: 0.97 (ref 0.00–1.49)
Prothrombin Time: 13.1 seconds (ref 11.6–15.2)

## 2015-07-09 LAB — APTT: aPTT: 33 seconds (ref 24–37)

## 2015-07-09 LAB — COMPREHENSIVE METABOLIC PANEL
ALT: 18 U/L (ref 14–54)
AST: 24 U/L (ref 15–41)
Albumin: 4.2 g/dL (ref 3.5–5.0)
Alkaline Phosphatase: 54 U/L (ref 38–126)
Anion gap: 11 (ref 5–15)
BUN: 18 mg/dL (ref 6–20)
CO2: 28 mmol/L (ref 22–32)
Calcium: 9.5 mg/dL (ref 8.9–10.3)
Chloride: 101 mmol/L (ref 101–111)
Creatinine, Ser: 0.89 mg/dL (ref 0.44–1.00)
GFR calc Af Amer: >60 mL/min (ref >60)
GFR calc non Af Amer: >60 mL/min (ref >60)
Glucose, Bld: 91 mg/dL (ref 65–99)
Potassium: 4.1 mmol/L (ref 3.5–5.1)
Sodium: 140 mmol/L (ref 135–145)
Total Bilirubin: 0.6 mg/dL (ref 0.3–1.2)
Total Protein: 6.5 g/dL (ref 6.5–8.1)

## 2015-07-09 NOTE — Pre-Procedure Instructions (Signed)
Hevin Jeffcoat  07/09/2015     Your procedure is scheduled on : Wednesday July 15, 2015 at 8:30 AM.  Report to [facility] Admitting at 6:30 AM.  Call this number if you have problems the morning of surgery: [phone]    Remember:  Do not eat food or drink liquids after midnight.  Take these medicines the morning of surgery with A SIP OF WATER : Citalopram (Celexa), Fexofenadine (Allegra)   Stop taking any vitamins, herbal medications, Ibuprofen, Advil, Motrin, Aleve, etc   Do not wear jewelry, make-up or nail polish.  Do not wear lotions, powders, or perfumes.    Do not shave 48 hours prior to surgery.   Do not bring valuables to the hospital.  [facility] is not responsible for any belongings or valuables.  Contacts, dentures or bridgework may not be worn into surgery.  Leave your suitcase in the car.  After surgery it may be brought to your room.  For patients admitted to the hospital, discharge time will be determined by your treatment team.  Patients discharged the day of surgery will not be allowed to drive home.   Name and phone number of your driver:    Special instructions:  Shower using CHG soap the night before and the morning of your surgery  Please read over the following fact sheets that you were given. Pain Booklet, Coughing and Deep Breathing and Surgical Site Infection Prevention

## 2015-07-09 NOTE — Progress Notes (Signed)
I called to let patient know form ready and she said she came and got it already. No note---she did say I can go ahead and fax it  [phone]-[employer]

## 2015-07-09 NOTE — Telephone Encounter (Signed)
Received this message through MyChart:   In thinking about the navigational bronchoscopy, I have a few questions:    ~~ what are the risks?    ~~ what should I expect after the procedure? Will there be any pain from the biopsies? I expect a sore throat and to feel groggy.    ~~how soon will I have results?        I currently have a follow up on 2/3 at 4:15 is that enough time to have the results as the procedure is 2/1?    Thanks

## 2015-07-10 NOTE — Telephone Encounter (Signed)
Call the patient and discussed the risks benefits, pros cons of navigational bronchoscopy. These include but are not limited to pneumothorax, bleeding, bronchitis, pneumonia. She understands the risks and we also discussed the potential side effects and the aftermath of the procedure and she agrees to proceed and we will plan for this on 07/15/15

## 2015-07-13 ENCOUNTER — Telehealth: Payer: Self-pay | Admitting: Emergency Medicine

## 2015-07-13 ENCOUNTER — Ambulatory Visit (INDEPENDENT_AMBULATORY_CARE_PROVIDER_SITE_OTHER)
Admission: RE | Admit: 2015-07-13 | Discharge: 2015-07-13 | Disposition: A | Discharge status: Home / Self Care | Payer: BC Managed Care – PPO | Source: Ambulatory Visit | Attending: Emergency Medicine | Admitting: Emergency Medicine

## 2015-07-13 DIAGNOSIS — R918 Other nonspecific abnormal finding of lung field: Secondary | ICD-10-CM | POA: Diagnosis not present

## 2015-07-13 NOTE — Telephone Encounter (Signed)
Order had to be placed for Super D Chest CT. Pt has bronch scheduled for tomorrow, RB will need this scan prior to doing bronch.

## 2015-07-13 NOTE — Telephone Encounter (Signed)
Spoke with pt. I have clarified with her when her bronch is and why she is having to repeat the CT. All of her questions have been answered. Nothing further was needed.

## 2015-07-14 NOTE — Anesthesia Preprocedure Evaluation (Addendum)
Anesthesia Evaluation  Patient identified by MRN, date of birth, ID band Patient awake    Reviewed: Allergy & Precautions, NPO status , Patient's Chart, lab work & pertinent test results  History of Anesthesia Complications (+) PONV and history of anesthetic complications  Airway Mallampati: II  TM Distance: >3 FB Neck ROM: Full    Dental  (+) Teeth Intact, Dental Advisory Given   Pulmonary    Pulmonary exam normal        Cardiovascular negative cardio ROS Normal cardiovascular exam     Neuro/Psych negative neurological ROS  negative psych ROS   GI/Hepatic negative GI ROS, Neg liver ROS,   Endo/Other  negative endocrine ROS  Renal/GU negative Renal ROS     Musculoskeletal   Abdominal   Peds  Hematology   Anesthesia Other Findings   Reproductive/Obstetrics                            Anesthesia Physical Anesthesia Plan  ASA: II  Anesthesia Plan: General   Post-op Pain Management:    Induction: Intravenous  Airway Management Planned: Oral ETT  Additional Equipment:   Intra-op Plan:   Post-operative Plan: Extubation in OR  Informed Consent: I have reviewed the patients History and Physical, chart, labs and discussed the procedure including the risks, benefits and alternatives for the proposed anesthesia with the patient or authorized representative who has indicated his/her understanding and acceptance.   Dental advisory given  Plan Discussed with: CRNA and Anesthesiologist  Anesthesia Plan Comments:        Anesthesia Quick Evaluation

## 2015-07-15 ENCOUNTER — Encounter (HOSPITAL_COMMUNITY)
Admission: RE | Disposition: A | Discharge status: Home / Self Care | Payer: Self-pay | Source: Ambulatory Visit | Attending: Emergency Medicine

## 2015-07-15 ENCOUNTER — Ambulatory Visit (HOSPITAL_COMMUNITY): Payer: BC Managed Care – PPO

## 2015-07-15 ENCOUNTER — Encounter (HOSPITAL_COMMUNITY): Payer: Self-pay | Admitting: *Deleted

## 2015-07-15 ENCOUNTER — Ambulatory Visit (HOSPITAL_COMMUNITY): Payer: BC Managed Care – PPO | Admitting: Anesthesiology

## 2015-07-15 ENCOUNTER — Ambulatory Visit (HOSPITAL_COMMUNITY)
Admission: RE | Admit: 2015-07-15 | Discharge: 2015-07-15 | Disposition: A | Discharge status: Home / Self Care | Payer: BC Managed Care – PPO | Source: Ambulatory Visit | Attending: Emergency Medicine | Admitting: Emergency Medicine

## 2015-07-15 DIAGNOSIS — C3491 Malignant neoplasm of unspecified part of right bronchus or lung: Secondary | ICD-10-CM | POA: Diagnosis present

## 2015-07-15 DIAGNOSIS — M1990 Unspecified osteoarthritis, unspecified site: Secondary | ICD-10-CM | POA: Insufficient documentation

## 2015-07-15 DIAGNOSIS — Z880 Allergy status to penicillin: Secondary | ICD-10-CM | POA: Diagnosis not present

## 2015-07-15 DIAGNOSIS — C3411 Malignant neoplasm of upper lobe, right bronchus or lung: Secondary | ICD-10-CM | POA: Insufficient documentation

## 2015-07-15 DIAGNOSIS — Z9889 Other specified postprocedural states: Secondary | ICD-10-CM

## 2015-07-15 DIAGNOSIS — Z419 Encounter for procedure for purposes other than remedying health state, unspecified: Secondary | ICD-10-CM

## 2015-07-15 DIAGNOSIS — R918 Other nonspecific abnormal finding of lung field: Secondary | ICD-10-CM | POA: Diagnosis not present

## 2015-07-15 HISTORY — PX: VIDEO BRONCHOSCOPY WITH ENDOBRONCHIAL NAVIGATION: SHX6175

## 2015-07-15 SURGERY — VIDEO BRONCHOSCOPY WITH ENDOBRONCHIAL NAVIGATION
Anesthesia: General | Site: Chest

## 2015-07-15 MED ORDER — ROCURONIUM BROMIDE 50 MG/5ML IV SOLN
INTRAVENOUS | Status: AC
  Filled 2015-07-15: qty 1

## 2015-07-15 MED ORDER — EPHEDRINE SULFATE 50 MG/ML IJ SOLN
INTRAMUSCULAR | Status: AC
  Filled 2015-07-15: qty 1

## 2015-07-15 MED ORDER — PROPOFOL 10 MG/ML IV BOLUS
INTRAVENOUS | Status: AC
  Filled 2015-07-15: qty 20

## 2015-07-15 MED ORDER — SUCCINYLCHOLINE CHLORIDE 20 MG/ML IJ SOLN
INTRAMUSCULAR | Status: AC
  Filled 2015-07-15: qty 1

## 2015-07-15 MED ORDER — EPHEDRINE SULFATE 50 MG/ML IJ SOLN
INTRAMUSCULAR | Status: DC | PRN
  Administered 2015-07-15 (×2): 5 mg via INTRAVENOUS

## 2015-07-15 MED ORDER — LIDOCAINE HCL (CARDIAC) 20 MG/ML IV SOLN
INTRAVENOUS | Status: AC
  Filled 2015-07-15: qty 5

## 2015-07-15 MED ORDER — 0.9 % SODIUM CHLORIDE (POUR BTL) OPTIME
TOPICAL | Status: DC | PRN
  Administered 2015-07-15: 1000 mL

## 2015-07-15 MED ORDER — GLYCOPYRROLATE 0.2 MG/ML IJ SOLN
INTRAMUSCULAR | Status: AC
  Filled 2015-07-15: qty 1

## 2015-07-15 MED ORDER — ARTIFICIAL TEARS OP OINT
TOPICAL_OINTMENT | OPHTHALMIC | Status: AC
  Filled 2015-07-15: qty 3.5

## 2015-07-15 MED ORDER — ARTIFICIAL TEARS OP OINT
TOPICAL_OINTMENT | OPHTHALMIC | Status: DC | PRN
  Administered 2015-07-15: 1 via OPHTHALMIC

## 2015-07-15 MED ORDER — MIDAZOLAM HCL 2 MG/2ML IJ SOLN
INTRAMUSCULAR | Status: AC
  Filled 2015-07-15: qty 2

## 2015-07-15 MED ORDER — HYDROMORPHONE HCL 1 MG/ML IJ SOLN: 0.2500 mg | INTRAMUSCULAR | Status: DC | PRN

## 2015-07-15 MED ORDER — LACTATED RINGERS IV SOLN
INTRAVENOUS | Status: DC | PRN
  Administered 2015-07-15 (×2): via INTRAVENOUS

## 2015-07-15 MED ORDER — FENTANYL CITRATE (PF) 100 MCG/2ML IJ SOLN
INTRAMUSCULAR | Status: DC | PRN
  Administered 2015-07-15: 100 ug via INTRAVENOUS
  Administered 2015-07-15: 25 ug via INTRAVENOUS

## 2015-07-15 MED ORDER — SCOPOLAMINE 1 MG/3DAYS TD PT72
1.0000 | MEDICATED_PATCH | TRANSDERMAL | Status: DC
  Administered 2015-07-15: 1 via TRANSDERMAL
  Administered 2015-07-15: 1.5 mg via TRANSDERMAL
  Filled 2015-07-15: qty 1

## 2015-07-15 MED ORDER — FENTANYL CITRATE (PF) 250 MCG/5ML IJ SOLN
INTRAMUSCULAR | Status: AC
  Filled 2015-07-15: qty 5

## 2015-07-15 MED ORDER — MIDAZOLAM HCL 5 MG/5ML IJ SOLN
INTRAMUSCULAR | Status: DC | PRN
  Administered 2015-07-15 (×2): 1 mg via INTRAVENOUS

## 2015-07-15 MED ORDER — DEXAMETHASONE SODIUM PHOSPHATE 4 MG/ML IJ SOLN
INTRAMUSCULAR | Status: AC
  Filled 2015-07-15: qty 1

## 2015-07-15 MED ORDER — SUGAMMADEX SODIUM 200 MG/2ML IV SOLN
INTRAVENOUS | Status: DC | PRN
  Administered 2015-07-15: 86.2 mg via INTRAVENOUS

## 2015-07-15 MED ORDER — ROCURONIUM BROMIDE 100 MG/10ML IV SOLN
INTRAVENOUS | Status: DC | PRN
  Administered 2015-07-15: 50 mg via INTRAVENOUS

## 2015-07-15 MED ORDER — DIPHENHYDRAMINE HCL 50 MG/ML IJ SOLN
INTRAMUSCULAR | Status: AC
  Filled 2015-07-15: qty 1

## 2015-07-15 MED ORDER — DIPHENHYDRAMINE HCL 50 MG/ML IJ SOLN
INTRAMUSCULAR | Status: DC | PRN
  Administered 2015-07-15: 12.5 mg via INTRAVENOUS

## 2015-07-15 MED ORDER — PROPOFOL 10 MG/ML IV BOLUS
INTRAVENOUS | Status: DC | PRN
  Administered 2015-07-15: 180 mg via INTRAVENOUS
  Administered 2015-07-15: 30 mg via INTRAVENOUS

## 2015-07-15 MED ORDER — ONDANSETRON HCL 4 MG/2ML IJ SOLN
INTRAMUSCULAR | Status: DC | PRN
  Administered 2015-07-15: 4 mg via INTRAVENOUS

## 2015-07-15 MED ORDER — PHENYLEPHRINE 40 MCG/ML (10ML) SYRINGE FOR IV PUSH (FOR BLOOD PRESSURE SUPPORT)
PREFILLED_SYRINGE | INTRAVENOUS | Status: AC
  Filled 2015-07-15: qty 10

## 2015-07-15 MED ORDER — SUGAMMADEX SODIUM 200 MG/2ML IV SOLN
INTRAVENOUS | Status: AC
  Filled 2015-07-15: qty 2

## 2015-07-15 MED ORDER — PROMETHAZINE HCL 25 MG/ML IJ SOLN: 6.2500 mg | INTRAMUSCULAR | Status: DC | PRN

## 2015-07-15 MED ORDER — ONDANSETRON HCL 4 MG/2ML IJ SOLN
INTRAMUSCULAR | Status: AC
  Filled 2015-07-15: qty 2

## 2015-07-15 MED ORDER — SODIUM CHLORIDE 0.9 % IJ SOLN
INTRAMUSCULAR | Status: AC
  Filled 2015-07-15: qty 10

## 2015-07-15 MED ORDER — DEXAMETHASONE SODIUM PHOSPHATE 4 MG/ML IJ SOLN
INTRAMUSCULAR | Status: DC | PRN
  Administered 2015-07-15: 4 mg via INTRAVENOUS

## 2015-07-15 SURGICAL SUPPLY — 39 items
ADAPTER BRONCH F/PENTAX (ADAPTER) ×2 IMPLANT
BRUSH CYTOL CELLEBRITY 1.5X140 (MISCELLANEOUS) IMPLANT
BRUSH SUPERTRAX BIOPSY (INSTRUMENTS) IMPLANT
BRUSH SUPERTRAX NDL-TIP CYTO (INSTRUMENTS) ×2 IMPLANT
CANISTER SUCTION 2500CC (MISCELLANEOUS) ×2 IMPLANT
CHANNEL WORK EXTEND EDGE 180 (KITS) IMPLANT
CHANNEL WORK EXTEND EDGE 45 (KITS) IMPLANT
CHANNEL WORK EXTEND EDGE 90 (KITS) IMPLANT
CONT SPEC 4OZ CLIKSEAL STRL BL (MISCELLANEOUS) ×4 IMPLANT
COVER TABLE BACK 60X90 (DRAPES) ×2 IMPLANT
FILTER STRAW FLUID ASPIR (MISCELLANEOUS) IMPLANT
FORCEPS BIOP SUPERTRX PREMAR (INSTRUMENTS) ×2 IMPLANT
GAUZE SPONGE 4X4 12PLY STRL (GAUZE/BANDAGES/DRESSINGS) ×2 IMPLANT
GLOVE BIO SURGEON STRL SZ7.5 (GLOVE) ×4 IMPLANT
GLOVE BIOGEL PI IND STRL 6.5 (GLOVE) ×1 IMPLANT
GLOVE BIOGEL PI INDICATOR 6.5 (GLOVE) ×1
GLOVE SS BIOGEL STRL SZ 6.5 (GLOVE) ×1 IMPLANT
GLOVE SUPERSENSE BIOGEL SZ 6.5 (GLOVE) ×1
KIT CLEAN ENDO COMPLIANCE (KITS) ×2 IMPLANT
KIT LOCATABLE GUIDE (CANNULA) IMPLANT
KIT MARKER FIDUCIAL DELIVERY (KITS) IMPLANT
KIT PROCEDURE EDGE 180 (KITS) IMPLANT
KIT PROCEDURE EDGE 45 (KITS) IMPLANT
KIT PROCEDURE EDGE 90 (KITS) IMPLANT
KIT PROCEDURE FIRM TIP 45 (KITS) ×2 IMPLANT
KIT ROOM TURNOVER OR (KITS) ×2 IMPLANT
MARKER SKIN DUAL TIP RULER LAB (MISCELLANEOUS) ×2 IMPLANT
NEEDLE SUPERTRX PREMARK BIOPSY (NEEDLE) ×2 IMPLANT
NS IRRIG 1000ML POUR BTL (IV SOLUTION) ×2 IMPLANT
OIL SILICONE PENTAX (PARTS (SERVICE/REPAIRS)) ×2 IMPLANT
PAD ARMBOARD 7.5X6 YLW CONV (MISCELLANEOUS) ×4 IMPLANT
PATCHES PATIENT (LABEL) ×6 IMPLANT
SYR 20CC LL (SYRINGE) ×2 IMPLANT
SYR 20ML ECCENTRIC (SYRINGE) ×2 IMPLANT
SYR 50ML SLIP (SYRINGE) ×2 IMPLANT
SYSTEM GENCUT CORE BIOPSY (NEEDLE) ×2 IMPLANT
TOWEL OR 17X24 6PK STRL BLUE (TOWEL DISPOSABLE) ×2 IMPLANT
TRAP SPECIMEN MUCOUS 40CC (MISCELLANEOUS) ×2 IMPLANT
TUBE CONNECTING 20X1/4 (TUBING) ×2 IMPLANT

## 2015-07-15 NOTE — Transfer of Care (Signed)
Immediate Anesthesia Transfer of Care Note  Patient: [name] West  Procedure(s) Performed: Procedure(s): VIDEO BRONCHOSCOPY WITH ENDOBRONCHIAL NAVIGATION with Biopsy (N/A)  Patient Location: PACU  Anesthesia Type:General  Level of Consciousness: awake, alert  and oriented  Airway & Oxygen Therapy: Patient Spontanous Breathing  Post-op Assessment: Report given to RN and Post -op Vital signs reviewed and stable  Post vital signs: Reviewed and stable  Last Vitals:  Filed Vitals:   07/15/15 0652 07/15/15 1005  BP: 113/60 121/71  Pulse: 80 98  Temp: 36.4 C   Resp: 16 32    Complications: No apparent anesthesia complications

## 2015-07-15 NOTE — Interval H&P Note (Signed)
PCCM Interval H&P  Patient is 65 year old woman who presents today for further evaluation of an isolated right upper lobe somewhat spiculated mass. She underwent repeat CT scan of the chest on 1/30 that showed no interval change in her right upper lobe lesion. She presents today for navigational bronchoscopy, biopsies, cultures. She reports no new issues. She may be having an increase in her chronic rhinorrhea and associated cough. No fevers chills chest pain etc.   Filed Vitals:   07/15/15 0652  BP: 113/60  Pulse: 80  Temp: 97.5 F (36.4 C)  TempSrc: Oral  Resp: 16  Height: 4' 11.5" (1.511 m)  Weight: 43.092 kg (95 lb)  SpO2: 100%   Gen: Pleasant, well-nourished, in no distress,  normal affect  ENT: No lesions,  mouth clear,  oropharynx clear, no postnasal drip  Neck: No JVD, no TMG, no carotid bruits  Lungs: No use of accessory muscles, no dullness to percussion, clear without rales or rhonchi  Cardiovascular: RRR, heart sounds normal, no murmur or gallops, no peripheral edema  Abdomen: soft and NT, no HSM,  BS normal  Musculoskeletal: No deformities, no cyanosis or clubbing  Neuro: alert, non focal  Skin: Warm, no lesions or rashes   Recent Labs Lab 07/09/15 1415  HGB 14.5  HCT 42.3  WBC 8.2  PLT 236    Recent Labs Lab 07/09/15 1415  NA 140  K 4.1  CL 101  CO2 28  GLUCOSE 91  BUN 18  CREATININE 0.89  CALCIUM 9.5    Recent Labs Lab 07/09/15 1415  INR 0.97      CT chest 07/13/15 --  COMPARISON: PET 06/25/2015 and CT chest 06/21/2015.  FINDINGS: Mediastinum/Lymph Nodes: No pathologically enlarged mediastinal or axillary lymph nodes. Hilar regions are difficult to definitively evaluate without IV contrast. Heart size normal. No pericardial effusion.  Lungs/Pleura: Collapse/ consolidation involves nearly the entire right upper lobe, has a masslike configuration and is stable in size, measuring 3.8 x 4.5 cm. Additional scattered pulmonary  nodules measure up to 5 mm, as before. No pleural fluid. Airway is otherwise unremarkable.  Upper abdomen: Visualized portions of the liver and right adrenal gland are unremarkable. Possible left adrenal thickening. Visualized portions of the kidneys, spleen, pancreas and stomach are grossly unremarkable.  Musculoskeletal: No worrisome lytic or sclerotic lesions.  IMPRESSION: 1. Collapse/consolidation involves nearly the entire right upper lobe and is unchanged in the short interval from 06/21/2015. Given borderline hypermetabolism on 06/25/2015, a post infectious/post inflammatory etiology is considered. However, malignancy cannot be excluded. 2. Additional scattered pulmonary nodules measure 5 mm or less in size. Continued attention on followup exams is warranted   Assessment/plan Right upper lobe poorly formed lesion worrisome for possible adenocarcinoma versus other inflammatory or infectious process. She presents today for no additional bronchoscopy and sampling. Case explained to both her and to her husband. All questions answered. No barriers identified. We will proceed this a.m. Under general anesthesia.  Baltazar Apo, MD, PhD 07/15/2015, 8:12 AM [facility] [phone] or if no answer [phone]

## 2015-07-15 NOTE — Discharge Instructions (Signed)
Flexible Bronchoscopy, Care After These instructions give you information on caring for yourself after your procedure. Your doctor may also give you more specific instructions. Call your doctor if you have any problems or questions after your procedure. HOME CARE  Do not eat or drink anything for 2 hours after your procedure. If you try to eat or drink before the medicine wears off, food or drink could go into your lungs. You could also burn yourself.  After 2 hours have passed and when you can cough and gag normally, you may eat soft food and drink liquids slowly.  The day after the test, you may eat your normal diet.  You may do your normal activities.  Keep all doctor visits. GET HELP RIGHT AWAY IF:  You get more and more short of breath.  You get light-headed.  You feel like you are going to pass out (faint).  You have chest pain.  You have new problems that worry you.  You cough up more than a little blood.  You cough up more blood than before. MAKE SURE YOU:  Understand these instructions.  Will watch your condition.  Will get help right away if you are not doing well or get worse.   This information is not intended to replace advice given to you by your health care provider. Make sure you discuss any questions you have with your health care provider.   Document Released: 03/27/2009 Document Revised: 06/04/2013 Document Reviewed: 02/01/2013 Elsevier Interactive Patient Education [employer].  Please call our office for any questions or concerns. [phone].

## 2015-07-15 NOTE — H&P (View-Only) (Signed)
Subjective:    Patient ID: [name] West, female    DOB: 21-Apr-1951, 65 y.o.   MRN: [id number]  HPI 65 year old woman, never smoker, with a history of arthritis, ovarian cyst. She was seen in the emergency department for left-sided chest discomfort and underwent a CT scan of the chest 06/21/15 that unfortunately showed a right upper lobe mass with the largest dimension 4.5 cm and associated spiculations. I have reviewed the images personally. This prompted a PET scan performed on 06/25/15. This showed very low level uptake in the right upper lobe mass without any evidence of distant disease. In particular there was no hypermetabolism in the mediastinal lymph nodes. She does have a large ovarian cyst noted on scan.    In retrospect she has had rhinorrhea. No fever, chills, cough or infectious prodrome. Has felt well.     Review of Systems  Constitutional: Negative for fever and unexpected weight change.  HENT: Negative for congestion, dental problem, ear pain, nosebleeds, postnasal drip, rhinorrhea, sinus pressure, sneezing, sore throat and trouble swallowing.   Eyes: Negative for redness and itching.  Respiratory: Negative for cough, chest tightness, shortness of breath and wheezing.   Cardiovascular: Negative for palpitations and leg swelling.  Gastrointestinal: Negative for nausea and vomiting.  Genitourinary: Negative for dysuria.  Musculoskeletal: Negative for joint swelling.  Skin: Negative for rash.  Neurological: Negative for headaches.  Hematological: Does not bruise/bleed easily.  Psychiatric/Behavioral: Negative for dysphoric mood. The patient is not nervous/anxious.    DD you have ordered quinolone Past Medical History  Diagnosis Date  . Arthritis     shoulders  . Complication of anesthesia     nausea  . Ovarian cyst      No family history on file.   Social History   Social History  . Marital Status: Married    Spouse Name: N/A  . Number of Children: N/A  . Years of  Education: N/A   Occupational History  . Not on file.   Social History Main Topics  . Smoking status: Never Smoker   . Smokeless tobacco: Never Used  . Alcohol Use: Yes     Comment: wine 1 glass per night  . Drug Use: No  . Sexual Activity: Not on file   Other Topics Concern  . Not on file   Social History Narrative     Allergies  Allergen Reactions  . Avelox [Moxifloxacin Hcl In Nacl] Anaphylaxis  . Cephalosporins Anaphylaxis  . Penicillins Anaphylaxis  . Omnipaque [Iohexol] Hives  . Doxycycline Other (See Comments)    Frequently urination   . Adhesive [Tape] Rash  . Lidoderm [Lidocaine] Rash  . Pseudoephedrine Palpitations  . Sulfa Antibiotics Rash     Outpatient Prescriptions Prior to Visit  Medication Sig Dispense Refill  . Azelaic Acid (FINACEA) 15 % cream Apply 1 application topically 2 (two) times daily. After skin is thoroughly washed and patted dry, gently but thoroughly massage a thin film of azelaic acid cream into the affected area twice daily, in the morning and evening.    . Brimonidine Tartrate (MIRVASO) 0.33 % GEL Apply 1 application topically daily.    . cholecalciferol (VITAMIN D) 1000 UNITS tablet Take 1,000 Units by mouth daily.    . citalopram (CELEXA) 10 MG tablet Take 10 mg by mouth every morning.    . fexofenadine (ALLEGRA) 180 MG tablet Take 180 mg by mouth daily.    . Misc Natural Products (TART CHERRY ADVANCED PO) Take by mouth.    Marland Kitchen  Multiple Vitamin (MULTIVITAMIN WITH MINERALS) TABS Take 1 tablet by mouth daily.    Marland Kitchen zolpidem (AMBIEN) 5 MG tablet Take 5 mg by mouth at bedtime as needed for sleep.    Marland Kitchen azithromycin (ZITHROMAX Z-PAK) 250 MG tablet 2 po day one, then 1 daily x 4 days 5 tablet 0  . ibandronate (BONIVA) 150 MG tablet Take 150 mg by mouth every 30 (thirty) days. Reported on 06/26/2015    . LORazepam (ATIVAN) 0.5 MG tablet Take 0.5 mg by mouth 2 (two) times daily.    . meloxicam (MOBIC) 15 MG tablet Take 15 mg by mouth daily.    .  phenylephrine (SUDAFED PE) 10 MG TABS Take 10 mg by mouth every 4 (four) hours as needed (for congestion). Reported on 06/26/2015     No facility-administered medications prior to visit.    Travels frequently, no known TB exposure.  She is a Primary school teacher Fungal exposures based [place]  Gardens frequently Lived in [place], [place], [place], [place], [place], [place], [place].        Objective:   Physical Exam Filed Vitals:   06/30/15 0953  BP: 104/78  Pulse: 72  Height: 4' 11.5" (1.511 m)  Weight: 94 lb (42.638 kg)  SpO2: 98%   Gen: Pleasant, well-nourished, in no distress,  normal affect  ENT: No lesions,  mouth clear,  oropharynx clear, no postnasal drip  Neck: No JVD, no TMG, no carotid bruits  Lungs: No use of accessory muscles, clear without rales or rhonchi   Cardiovascular: RRR, heart sounds normal, no murmur or gallops, no peripheral edema  Abdomen: soft and NT, no HSM,  BS normal  Musculoskeletal: No deformities, no cyanosis or clubbing  Neuro: alert, non focal  Skin: Warm, no lesions or rashes      Assessment & Plan:  Lung mass Right upper lobe mass noted on CT scan of the chest and with very mild uptake on subsequent PET scan. There is no distant disease and she may be a candidate for surgical resection if this is an adenocarcinoma. Etiology is unclear but I do suspect a slow growing primary lung cancer. She is certainly also at risk for possible atypical infection or for an autoimmune process. I do not believe this is a community acquired pneumonia based on the absence of symptoms. A high-resolution CT scan is available at [facility] i will set up asap.

## 2015-07-15 NOTE — Progress Notes (Signed)
Dr  byrum at bedside states cxr ok pt can go home

## 2015-07-15 NOTE — Op Note (Signed)
Video Bronchoscopy with Electromagnetic Navigation Procedure Note  Date of Operation: 07/15/2015  Pre-op Diagnosis: right upper lobe mass  Post-op Diagnosis: same  Surgeon: Baltazar Apo  Assistants: none  Anesthesia: General endotracheal anesthesia  Operation: Flexible video fiberoptic bronchoscopy with electromagnetic navigation and biopsies.  Estimated Blood Loss: Minimal  Complications: none apparent  Indications and History: [name] West is a 65 y.o. female with no history of tobacco use who was found to have an irregular 3.8 x 4.5 cm right upper lobe masslike opacity of unclear etiology. Recommendation was made to achieve tissue diagnosis via navigational bronchoscopy.  The risks, benefits, complications, treatment options and expected outcomes were discussed with the patient.  The possibilities of pneumothorax, pneumonia, reaction to medication, pulmonary aspiration, perforation of a viscus, bleeding, failure to diagnose a condition and creating a complication requiring transfusion or operation were discussed with the patient who freely signed the consent.    Description of Procedure: The patient was seen in the Preoperative Area, was examined and was deemed appropriate to proceed.  The patient was taken to [facility], identified as [name] West and the procedure verified as Flexible Video Fiberoptic Bronchoscopy.  A Time Out was held and the above information confirmed.   Prior to the date of the procedure a high-resolution CT scan of the chest was performed. Utilizing [place] a virtual tracheobronchial tree was generated to allow the creation of distinct navigation pathways to the patient's parenchymal abnormality. After being taken to the operating room general anesthesia was initiated and the patient  was orally intubated. The video fiberoptic bronchoscope was introduced via the endotracheal tube and a general inspection was performed which showed normal left-sided  airways. 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 The proximal intermedius and right middle lobe and right lower lobe airways were all normal. There were no endobronchial lesions or abnormal secretions noted throughout the entire exam. The extendable working channel and locator guide were introduced into the bronchoscope. The distinct navigation pathways prepared prior to this procedure were then utilized to navigate to within 0.8-1.5cm of patient's right upper lobe mass identified on CT scan. The extendable working channel was secured into place and the locator guide was withdrawn. Under fluoroscopic guidance transbronchial needle brushings, transbronchial Wang needle biopsies, and transbronchial forceps biopsies were performed to be sent for cytology and pathology. Gen-cut biopsies were performed in the central area of the right upper lobe opacity. A bronchioalveolar lavage was performed in the right upper lobe and sent for cytology and microbiology (bacterial, fungal, AFB smears and cultures). At the end of the procedure a general airway inspection was performed and there was no evidence of active bleeding. The bronchoscope was removed.  The patient tolerated the procedure well. There was no significant blood loss and there were no obvious complications. A post-procedural chest x-ray is pending.  Samples: 1. Transbronchial needle brushings from right upper lobe mass 2. Transbronchial Wang needle biopsies from right upper lobe mass 3. Transbronchial forceps biopsies from right upper lobe mass 4. Bronchoalveolar lavage from right upper lobe 5. Gen-cut biopsies from right upper lobe mass  Plans:  The patient will be discharged from the PACU to home when recovered from anesthesia and after chest x-ray is reviewed. We will review the cytology, pathology and microbiology results with the patient when they become available. Outpatient followup will  be with Dr Lamonte Sakai.   Baltazar Apo, MD, PhD 07/15/2015, 9:48 AM [facility]  [phone] or if no answer [phone]

## 2015-07-15 NOTE — Anesthesia Procedure Notes (Signed)
Procedure Name: Intubation Date/Time: 07/15/2015 8:31 AM Performed by: Suzy Bouchard Pre-anesthesia Checklist: Patient identified, Emergency Drugs available, Suction available, Timeout performed and Patient being monitored Patient Re-evaluated:Patient Re-evaluated prior to inductionOxygen Delivery Method: Circle system utilized Preoxygenation: Pre-oxygenation with 100% oxygen Intubation Type: IV induction Ventilation: Mask ventilation without difficulty Laryngoscope Size: Miller and 2 Grade View: Grade I Tube type: Oral Laser Tube: Cuffed inflated with minimal occlusive pressure - saline Tube size: 8.0 mm Airway Equipment and Method: Stylet Placement Confirmation: ETT inserted through vocal cords under direct vision,  positive ETCO2 and breath sounds checked- equal and bilateral Secured at: 21 cm Tube secured with: Tape Dental Injury: Teeth and Oropharynx as per pre-operative assessment

## 2015-07-16 ENCOUNTER — Encounter (HOSPITAL_COMMUNITY): Payer: Self-pay | Admitting: Emergency Medicine

## 2015-07-16 LAB — AFB STAIN: Acid Fast Smear: NONE SEEN

## 2015-07-16 LAB — FUNGAL STAIN: Fungal Smear: NONE SEEN

## 2015-07-16 NOTE — Anesthesia Postprocedure Evaluation (Signed)
Anesthesia Post Note  Patient: [name] West  Procedure(s) Performed: Procedure(s) (LRB): VIDEO BRONCHOSCOPY WITH ENDOBRONCHIAL NAVIGATION with Biopsy (N/A)  Patient location during evaluation: PACU Anesthesia Type: General Level of consciousness: sedated Pain management: pain level controlled Vital Signs Assessment: post-procedure vital signs reviewed and stable Respiratory status: spontaneous breathing and respiratory function stable Cardiovascular status: stable Anesthetic complications: no    Last Vitals:  Filed Vitals:   07/15/15 1100 07/15/15 1106  BP: 102/64 105/59  Pulse: 90 90  Temp:  36.9 C  Resp: 23 20    Last Pain:  Filed Vitals:   07/16/15 1410  PainSc: 0-No pain                 SINGER,JAMES DANIEL

## 2015-07-17 ENCOUNTER — Ambulatory Visit: Payer: BC Managed Care – PPO | Admitting: Emergency Medicine

## 2015-07-17 ENCOUNTER — Telehealth: Payer: Self-pay | Admitting: Emergency Medicine

## 2015-07-17 DIAGNOSIS — C3491 Malignant neoplasm of unspecified part of right bronchus or lung: Secondary | ICD-10-CM

## 2015-07-17 NOTE — Telephone Encounter (Signed)
Spoke with pt, requesting results from [place] on 07/15/15.    RB please advise on results.  Thanks!

## 2015-07-17 NOTE — Telephone Encounter (Signed)
Discussed biopsy results with the patient. These showed non-small cell lung cancer. I believe she is a potential surgical candidate based on absence of distant disease on PET scan. We will refer her to see thoracic surgery and will obtain pulmonary function testing in preparation for that visit. she understands and agrees with the plan.   I have made the referral to thoracic surgery. Please order full pulmonary function testing to be done as soon as possible. Thank you

## 2015-07-20 NOTE — Telephone Encounter (Signed)
Pt scheduled for PFT 07/21/15 @ 11a Aware to arrive about 15 mins early.  Nothing further needed.

## 2015-07-21 ENCOUNTER — Ambulatory Visit (INDEPENDENT_AMBULATORY_CARE_PROVIDER_SITE_OTHER): Payer: BC Managed Care – PPO | Admitting: Emergency Medicine

## 2015-07-21 ENCOUNTER — Encounter: Payer: Self-pay | Admitting: Thoracic Surgery (Cardiothoracic Vascular Surgery)

## 2015-07-21 ENCOUNTER — Institutional Professional Consult (permissible substitution) (INDEPENDENT_AMBULATORY_CARE_PROVIDER_SITE_OTHER): Payer: BC Managed Care – PPO | Admitting: Thoracic Surgery (Cardiothoracic Vascular Surgery)

## 2015-07-21 ENCOUNTER — Encounter: Payer: Self-pay | Admitting: *Deleted

## 2015-07-21 VITALS — BP 116/79 | HR 85 | Resp 16 | Ht 59.5 in | Wt 93.0 lb

## 2015-07-21 DIAGNOSIS — C3411 Malignant neoplasm of upper lobe, right bronchus or lung: Secondary | ICD-10-CM | POA: Diagnosis not present

## 2015-07-21 DIAGNOSIS — C3491 Malignant neoplasm of unspecified part of right bronchus or lung: Secondary | ICD-10-CM

## 2015-07-21 LAB — PULMONARY FUNCTION TEST
DL/VA % pred: 104 %
DL/VA: 4.35 ml/min/mmHg/L
DLCO unc % pred: 87 %
DLCO unc: 15.85 ml/min/mmHg
FEF 25-75 Post: 1.97 L/sec
FEF 25-75 Pre: 2.02 L/sec
FEF2575-%Change-Post: -2 %
FEF2575-%Pred-Post: 107 %
FEF2575-%Pred-Pre: 109 %
FEV1-%Change-Post: 0 %
FEV1-%Pred-Post: 107 %
FEV1-%Pred-Pre: 106 %
FEV1-Post: 2.12 L
FEV1-Pre: 2.11 L
FEV1FVC-%Change-Post: 2 %
FEV1FVC-%Pred-Pre: 102 %
FEV6-%Change-Post: -1 %
FEV6-%Pred-Post: 104 %
FEV6-%Pred-Pre: 106 %
FEV6-Post: 2.6 L
FEV6-Pre: 2.65 L
FEV6FVC-%Change-Post: 0 %
FEV6FVC-%Pred-Post: 104 %
FEV6FVC-%Pred-Pre: 104 %
FVC-%Change-Post: -2 %
FVC-%Pred-Post: 100 %
FVC-%Pred-Pre: 103 %
FVC-Post: 2.61 L
FVC-Pre: 2.68 L
Post FEV1/FVC ratio: 81 %
Post FEV6/FVC ratio: 100 %
Pre FEV1/FVC ratio: 79 %
Pre FEV6/FVC Ratio: 100 %
RV % pred: 107 %
RV: 2.02 L
TLC % pred: 107 %
TLC: 4.7 L

## 2015-07-21 NOTE — Progress Notes (Signed)
PFT done today. 

## 2015-07-21 NOTE — Progress Notes (Signed)
PCP is MCNEILL,WENDY, MD Referring Provider is Collene Gobble, MD  Chief Complaint  Patient presents with  . Lung Cancer    PATH 07/15/15.Marland KitchenPET 06/25/15, SUPER-D CHEST CT 07/13/15, PFT 07/21/15    HPI: 65 year old West sent for consultation regarding a right upper lobe adenocarcinoma.  [name] West who is a lifelong nonsmoker. In January she had an episode of chest pain. This was left-sided chest pain. Her EKG was normal and troponins were negative. A chest x-ray showed a shadow in the right upper lobe. A CT of the chest was done and showed a 4.5 cm area of consolidation in the right upper lobe.  She saw Dr. Lamonte Sakai. A PET CT showed the right upper lobe mass was hypermetabolic with an SUV of 8.29. He did a navigational bronchoscopy on 07/15/2015. Biopsies were positive for non-small cell carcinoma, favor adenocarcinoma.  As noted she is a lifelong nonsmoker. She denies shortness of breath, wheezing and weight loss. She did cough up small amounts of blood after her bronchoscopy but otherwise has not had a cough or hemoptysis. She had the episode of left-sided chest pain in January but has not had any further episodes of that since then. Her exercise tolerance excellent. She walks 30-40 minutes a day. She has not had any unusual headaches or visual changes. She does have arthritis but does not have any new or unusual bone or joint pain. Zubrod Score: At the time of surgery this patient's most appropriate activity status/level should be described as: '[x]'$     0    Normal activity, no symptoms '[]'$     1    Restricted in physical strenuous activity but ambulatory, able to do out light work '[]'$     2    Ambulatory and capable of self care, unable to do work activities, up and about >50 % of waking hours                              '[]'$     3    Only limited self care, in bed greater than 50% of waking hours '[]'$     4    Completely disabled, no self care, confined to bed or chair '[]'$     5     Moribund    Past Medical History  Diagnosis Date  . Arthritis     shoulders  . Ovarian cyst   . Complication of anesthesia     nausea  . PONV (postoperative nausea and vomiting)   . Family history of adverse reaction to anesthesia     Patients grandmother died while having hand surgery; pt unsure of cause  . Environmental and seasonal allergies     Past Surgical History  Procedure Laterality Date  . Right rotator cuff Right   . Cesarean section  1984  . Fibroid removed    . Colonoscopy with propofol N/A 09/27/2012    Procedure: COLONOSCOPY WITH PROPOFOL;  Surgeon: Garlan Fair, MD;  Location: [facility];  Service: Endoscopy;  Laterality: N/A;  . Video bronchoscopy with endobronchial navigation N/A 07/15/2015    Procedure: VIDEO BRONCHOSCOPY WITH ENDOBRONCHIAL NAVIGATION with Biopsy;  Surgeon: Collene Gobble, MD;  Location: [facility] OR;  Service: Thoracic;  Laterality: N/A;    Family History  Father heart disease late in life Mother alive with hypertension No family history of cancer  Social History Social History  Substance Use Topics  . Smoking status: Never  Smoker   . Smokeless tobacco: Never Used  . Alcohol Use: Yes     Comment: wine 1 glass per night  She works as a Community education officer for the [employer]  . Azelaic Acid (FINACEA) 15 % cream Apply 1 application topically 2 (two) times daily. After skin is thoroughly washed and patted dry, gently but thoroughly massage a thin film of azelaic acid cream into the affected area twice daily, in the morning and evening.    . Brimonidine Tartrate (MIRVASO) 0.33 % GEL Apply 1 application topically daily.    . cholecalciferol (VITAMIN D) 1000 UNITS tablet Take 1,000 Units by mouth daily.    . citalopram (CELEXA) 10 MG tablet Take 10 mg by mouth every morning.    . fexofenadine (ALLEGRA) 180 MG tablet Take 180 mg by mouth daily.    . Misc Natural Products (TART CHERRY ADVANCED  PO) Take 1 tablet by mouth daily.     . Multiple Vitamin (MULTIVITAMIN WITH MINERALS) TABS Take 1 tablet by mouth daily.    Marland Kitchen zolpidem (AMBIEN) 5 MG tablet Take 5 mg by mouth at bedtime as needed for sleep.     No current facility-administered medications for this visit.    Allergies  Allergen Reactions  . Avelox [Moxifloxacin Hcl In Nacl] Anaphylaxis  . Cephalosporins Anaphylaxis  . Penicillins Anaphylaxis  . Omnipaque [Iohexol] Hives  . Doxycycline Other (See Comments)    Frequently urination   . Adhesive [Tape] Rash  . Lidoderm [Lidocaine] Rash  . Pseudoephedrine Palpitations  . Sulfa Antibiotics Rash    Review of Systems  Constitutional: Negative for fever, chills, activity change, fatigue and unexpected weight change.  HENT: Negative for congestion, trouble swallowing and voice change.   Eyes: Positive for visual disturbance (floaters. No blurred or double vision).  Respiratory: Negative for cough, shortness of breath and wheezing.   Cardiovascular: Positive for chest pain (see HPI).  Gastrointestinal: Negative for abdominal pain, blood in stool and abdominal distention.  Endocrine: Negative for polydipsia, polyphagia and polyuria.  Genitourinary: Negative for dysuria, hematuria and difficulty urinating.  Musculoskeletal: Positive for joint swelling and arthralgias.  Allergic/Immunologic:       Multiple drug allergies  Neurological: Negative for dizziness, syncope and headaches.  Hematological: Negative for adenopathy. Does not bruise/bleed easily.  All other systems reviewed and are negative.   BP 116/79 mmHg  Pulse 85  Resp 16  Ht 4' 11.5" (1.511 m)  Wt 93 lb (42.185 kg)  BMI 18.48 kg/m2  SpO2 98% Physical Exam  Constitutional: She is oriented to person, place, and time. She appears well-developed and well-nourished. No distress.  HENT:  Head: Normocephalic and atraumatic.  Eyes: Conjunctivae and EOM are normal. No scleral icterus.  Neck: Normal range of  motion. Neck supple. No thyromegaly present.  Cardiovascular: Normal rate, regular rhythm, normal heart sounds and intact distal pulses.  Exam reveals no gallop and no friction rub.   No murmur heard. Pulmonary/Chest: Effort normal and breath sounds normal. No respiratory distress. She has no wheezes. She has no rales.  Abdominal: Soft. Bowel sounds are normal. She exhibits no distension. There is no tenderness.  Musculoskeletal: Normal range of motion. She exhibits no edema or tenderness.  Lymphadenopathy:    She has no cervical adenopathy.  Neurological: She is alert and oriented to person, place, and time. No cranial nerve deficit. She exhibits normal muscle tone. Coordination normal.  Skin: Skin is warm and dry.  Psychiatric:  She has a normal mood and affect.  Vitals reviewed.    Diagnostic Tests: CT CHEST WITHOUT CONTRAST  TECHNIQUE: Multidetector CT imaging of the chest was performed using thin slice collimation for electromagnetic bronchoscopy planning purposes, without intravenous contrast.  COMPARISON: PET 06/25/2015 and CT chest 06/21/2015.  FINDINGS: Mediastinum/Lymph Nodes: No pathologically enlarged mediastinal or axillary lymph nodes. Hilar regions are difficult to definitively evaluate without IV contrast. Heart size normal. No pericardial effusion.  Lungs/Pleura: Collapse/ consolidation involves nearly the entire right upper lobe, has a masslike configuration and is stable in size, measuring 3.8 x 4.5 cm. Additional scattered pulmonary nodules measure up to 5 mm, as before. No pleural fluid. Airway is otherwise unremarkable.  Upper abdomen: Visualized portions of the liver and right adrenal gland are unremarkable. Possible left adrenal thickening. Visualized portions of the kidneys, spleen, pancreas and stomach are grossly unremarkable.  Musculoskeletal: No worrisome lytic or sclerotic lesions.  IMPRESSION: 1. Collapse/consolidation involves nearly  the entire right upper lobe and is unchanged in the short interval from 06/21/2015. Given borderline hypermetabolism on 06/25/2015, a post infectious/post inflammatory etiology is considered. However, malignancy cannot be excluded. 2. Additional scattered pulmonary nodules measure 5 mm or less in size. Continued attention on followup exams is warranted.   Electronically Signed  By: Lorin Picket M.D.  On: 07/13/2015 16:19 NUCLEAR MEDICINE PET SKULL BASE TO THIGH  TECHNIQUE: 12.46 mCi F-18 FDG was injected intravenously. Full-ring PET imaging was performed from the skull base to thigh after the radiotracer. CT data was obtained and used for attenuation correction and anatomic localization.  FASTING BLOOD GLUCOSE: Value: 87 mg/dl  COMPARISON: None.  FINDINGS: NECK  No hypermetabolic lymph nodes in the neck.  CHEST  There is a mass within the right upper lobe measuring 4.5 cm. This has an SUV max equal to 2.99. No additional hypermetabolic pulmonary nodules or masses identified. No hypermetabolic mediastinal or hilar lymph nodes identified. There is no supraclavicular or axillary adenopathy.  ABDOMEN/PELVIS  No abnormal hypermetabolic activity within the liver, pancreas, adrenal glands, or spleen. Fluid attenuating structure within the posterior pelvis measures 4.7 cm, image 207 of series 4. Likely ovarian cyst. No hypermetabolic lymph nodes in the abdomen or pelvis.  SKELETON  No focal hypermetabolic activity to suggest skeletal metastasis.  IMPRESSION: 1. There is malignant range FDG uptake associated with the masslike density within the right upper lobe. Cannot rule out pulmonary malignancy such as pulmonary adenocarcinoma. Correlation with tissue sampling recommended. 2. No evidence for mediastinal or hilar lymph node metastasis and no distant metastatic disease. 3. 4.7 cm left ovarian cyst. This is almost certainly benign, but follow up  ultrasound is recommended in 1 year according to the Society of Radiologists in [place] Statement (D Clovis Riley et al. Management of Asymptomatic Ovarian and Other Adnexal Cysts Imaged at [place]: Society of Radiologists in [place] Statement 2010. Radiology 256 (Sept 2010): 734-193.).   Electronically Signed  By: Kerby Moors M.D.  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 This most likely is adenocarcinoma with lepidic spread based on the pathology and radiology findings. This would also be consistent with her being a lifelong nonsmoker.  She has no known exposure to radon or other carcinogens.  Clinically she has stage IB disease with the mass being larger than 3 cm. There is no mediastinal or hilar adenopathy by CT or PET CT. Surgery is the primary mode of treatment, but she may also need adjuvant chemotherapy based on the final pathology findings.  Her pulmonary function is normal. She should be able to tolerate a lobectomy without significant impairment.  I recommended her that we proceed with right video-assisted thoracoscopy and right upper lobectomy. I described the operation to her in detail, including the need for general anesthesia, the incisions to be used, the expected hospital stay, chest tubes postoperatively, and the overall recovery. She understands that complete surgical resection gives her a good chance of a cure but does not guarantee a cure. I reviewed the indications, risks, benefits, and alternatives. She understands the risks include, but are not limited to death, MI, DVT, PE, bleeding, possible need for  transfusion, infection, prolonged air leak, cardiac arrhythmias, chronic pain, as well as the possibility of other unforeseeable complications.  She accepts these risks and agrees to proceed.  Plan: Right VATS, right upper lobectomy on Thursday 07/30/2015  Melrose Nakayama, MD [facility] [phone]

## 2015-07-22 ENCOUNTER — Other Ambulatory Visit: Payer: Self-pay | Admitting: *Deleted

## 2015-07-22 DIAGNOSIS — C801 Malignant (primary) neoplasm, unspecified: Secondary | ICD-10-CM

## 2015-07-23 ENCOUNTER — Ambulatory Visit (INDEPENDENT_AMBULATORY_CARE_PROVIDER_SITE_OTHER): Payer: BC Managed Care – PPO | Admitting: Emergency Medicine

## 2015-07-23 ENCOUNTER — Encounter: Payer: Self-pay | Admitting: Emergency Medicine

## 2015-07-23 VITALS — BP 106/72 | HR 74 | Wt 94.0 lb

## 2015-07-23 DIAGNOSIS — C3491 Malignant neoplasm of unspecified part of right bronchus or lung: Secondary | ICD-10-CM

## 2015-07-23 NOTE — Assessment & Plan Note (Signed)
Also also lung cancer most consistent with adenocarcinoma. Without any evidence of metastatic disease. Appears to be stage IB. She will undergo right upper lobectomy with Dr. Roxan Hockey next week. Pulmonary function testing was normal. No barriers to proceeding from my standpoint.

## 2015-07-23 NOTE — Progress Notes (Signed)
Subjective:    Patient ID: [name] West, female    DOB: 03-23-1951, 65 y.o.   MRN: [id number]  HPI 65 year old woman, never smoker, with a history of arthritis, ovarian cyst. She was seen in the emergency department for left-sided chest discomfort and underwent a CT scan of the chest 06/21/15 that unfortunately showed a right upper lobe mass with the largest dimension 4.5 cm and associated spiculations. I have reviewed the images personally. This prompted a PET scan performed on 06/25/15. This showed very low level uptake in the right upper lobe mass without any evidence of distant disease. In particular there was no hypermetabolism in the mediastinal lymph nodes. She does have a large ovarian cyst noted on scan.    In retrospect she has had rhinorrhea. No fever, chills, cough or infectious prodrome. Has felt well.    ROV 07/23/15 -- follow-up visit for right upper lobe mass on CT scan of the chest. We have performed bronchoscopy with biopsies that reveal non-small cell lung cancer most consistent with an adenocarcinoma. The lesion does have a lipidic pattern on CT. She has seen thoracic surgery to discuss primary resection and absence of any evidence for distant disease. She underwent pulmonary function testing on 07/21/15 that I have personally reviewed. These show normal spirometry, normal lung volumes, normal DLCO. I do not see any barriers for her to proceed with thoracic surgery, right upper lobe lobectomy.    Review of Systems  Constitutional: Negative for fever and unexpected weight change.  HENT: Negative for congestion, dental problem, ear pain, nosebleeds, postnasal drip, rhinorrhea, sinus pressure, sneezing, sore throat and trouble swallowing.   Eyes: Negative for redness and itching.  Respiratory: Negative for cough, chest tightness, shortness of breath and wheezing.   Cardiovascular: Negative for palpitations and leg swelling.  Gastrointestinal: Negative for nausea and vomiting.    Genitourinary: Negative for dysuria.  Musculoskeletal: Negative for joint swelling.  Skin: Negative for rash.  Neurological: Negative for headaches.  Hematological: Does not bruise/bleed easily.  Psychiatric/Behavioral: Negative for dysphoric mood. The patient is not nervous/anxious.    DD you have ordered quinolone Past Medical History  Diagnosis Date  . Arthritis     shoulders  . Ovarian cyst   . Complication of anesthesia     nausea  . PONV (postoperative nausea and vomiting)   . Family history of adverse reaction to anesthesia     Patients grandmother died while having hand surgery; pt unsure of cause  . Environmental and seasonal allergies      No family history on file.   Social History   Social History  . Marital Status: Married    Spouse Name: N/A  . Number of Children: N/A  . Years of Education: N/A   Occupational History  . Not on file.   Social History Main Topics  . Smoking status: Never Smoker   . Smokeless tobacco: Never Used  . Alcohol Use: Yes     Comment: wine 1 glass per night  . Drug Use: No  . Sexual Activity: Not on file   Other Topics Concern  . Not on file   Social History Narrative     Allergies  Allergen Reactions  . Avelox [Moxifloxacin Hcl In Nacl] Anaphylaxis  . Cephalosporins Anaphylaxis  . Penicillins Anaphylaxis  . Omnipaque [Iohexol] Hives  . Doxycycline Other (See Comments)    Frequently urination   . Adhesive [Tape] Rash  . Lidoderm [Lidocaine] Rash  . Pseudoephedrine Palpitations  . Sulfa Antibiotics  Rash     Outpatient Prescriptions Prior to Visit  Medication Sig Dispense Refill  . Azelaic Acid (FINACEA) 15 % cream Apply 1 application topically 2 (two) times daily. After skin is thoroughly washed and patted dry, gently but thoroughly massage a thin film of azelaic acid cream into the affected area twice daily, in the morning and evening.    . Brimonidine Tartrate (MIRVASO) 0.33 % GEL Apply 1 application topically  daily.    . cholecalciferol (VITAMIN D) 1000 UNITS tablet Take 1,000 Units by mouth daily.    . citalopram (CELEXA) 10 MG tablet Take 10 mg by mouth every morning.    . fexofenadine (ALLEGRA) 180 MG tablet Take 180 mg by mouth daily.    . Misc Natural Products (TART CHERRY ADVANCED PO) Take 1 tablet by mouth daily.     . Multiple Vitamin (MULTIVITAMIN WITH MINERALS) TABS Take 1 tablet by mouth daily.    Marland Kitchen zolpidem (AMBIEN) 5 MG tablet Take 5 mg by mouth at bedtime as needed for sleep.     No facility-administered medications prior to visit.    Travels frequently, no known TB exposure.  She is a Primary school teacher Fungal exposures based [place]  [place] frequently Lived in [place], [place], [place], [place], [place], [place], [place].        Objective:   Physical Exam Filed Vitals:   07/23/15 1332  BP: 106/72  Pulse: 74  Weight: 94 lb (42.638 kg)  SpO2: 99%   Gen: Pleasant, well-nourished, in no distress,  normal affect  ENT: No lesions,  mouth clear,  oropharynx clear, no postnasal drip  Neck: No JVD, no TMG, no carotid bruits  Lungs: No use of accessory muscles, clear without rales or rhonchi   Cardiovascular: RRR, heart sounds normal, no murmur or gallops, no peripheral edema  Abdomen: soft and NT, no HSM,  BS normal  Musculoskeletal: No deformities, no cyanosis or clubbing  Neuro: alert, non focal  Skin: Warm, no lesions or rashes      Assessment & Plan:  Non-small cell lung cancer (HCC) Also also lung cancer most consistent with adenocarcinoma. Without any evidence of metastatic disease. Appears to be stage IB. She will undergo right upper lobectomy with Dr. Roxan Hockey next week. Pulmonary function testing was normal. No barriers to proceeding from my standpoint.

## 2015-07-23 NOTE — Patient Instructions (Addendum)
Please continue to follow with Dr Roxan Hockey.  Planning for lung surgery next Thursday.  Your pulmonary function testing is normal.  Follow with Dr Lamonte Sakai as needed

## 2015-07-28 ENCOUNTER — Telehealth: Payer: Self-pay | Admitting: Emergency Medicine

## 2015-07-28 ENCOUNTER — Encounter (HOSPITAL_COMMUNITY)
Admission: RE | Admit: 2015-07-28 | Discharge: 2015-07-28 | Disposition: A | Discharge status: Home / Self Care | Payer: BC Managed Care – PPO | Source: Ambulatory Visit | Attending: Thoracic Surgery (Cardiothoracic Vascular Surgery) | Admitting: Thoracic Surgery (Cardiothoracic Vascular Surgery)

## 2015-07-28 ENCOUNTER — Ambulatory Visit (HOSPITAL_COMMUNITY)
Admission: RE | Admit: 2015-07-28 | Discharge: 2015-07-28 | Disposition: A | Discharge status: Home / Self Care | Payer: BC Managed Care – PPO | Source: Ambulatory Visit | Attending: Thoracic Surgery (Cardiothoracic Vascular Surgery) | Admitting: Thoracic Surgery (Cardiothoracic Vascular Surgery)

## 2015-07-28 ENCOUNTER — Encounter: Payer: Self-pay | Admitting: Emergency Medicine

## 2015-07-28 ENCOUNTER — Encounter (HOSPITAL_COMMUNITY): Payer: Self-pay

## 2015-07-28 VITALS — BP 130/75 | HR 76 | Temp 97.3°F | Resp 16 | Ht 59.5 in | Wt 95.1 lb

## 2015-07-28 DIAGNOSIS — E8770 Fluid overload, unspecified: Secondary | ICD-10-CM | POA: Diagnosis not present

## 2015-07-28 DIAGNOSIS — Z01810 Encounter for preprocedural cardiovascular examination: Secondary | ICD-10-CM

## 2015-07-28 DIAGNOSIS — J439 Emphysema, unspecified: Secondary | ICD-10-CM

## 2015-07-28 DIAGNOSIS — Z01818 Encounter for other preprocedural examination: Secondary | ICD-10-CM | POA: Insufficient documentation

## 2015-07-28 DIAGNOSIS — M1990 Unspecified osteoarthritis, unspecified site: Secondary | ICD-10-CM | POA: Diagnosis present

## 2015-07-28 DIAGNOSIS — R918 Other nonspecific abnormal finding of lung field: Secondary | ICD-10-CM | POA: Insufficient documentation

## 2015-07-28 DIAGNOSIS — Z01812 Encounter for preprocedural laboratory examination: Secondary | ICD-10-CM | POA: Insufficient documentation

## 2015-07-28 DIAGNOSIS — T40605A Adverse effect of unspecified narcotics, initial encounter: Secondary | ICD-10-CM | POA: Diagnosis not present

## 2015-07-28 DIAGNOSIS — C771 Secondary and unspecified malignant neoplasm of intrathoracic lymph nodes: Secondary | ICD-10-CM | POA: Diagnosis present

## 2015-07-28 DIAGNOSIS — C801 Malignant (primary) neoplasm, unspecified: Secondary | ICD-10-CM

## 2015-07-28 DIAGNOSIS — K5900 Constipation, unspecified: Secondary | ICD-10-CM | POA: Diagnosis not present

## 2015-07-28 DIAGNOSIS — K5903 Drug induced constipation: Secondary | ICD-10-CM | POA: Diagnosis not present

## 2015-07-28 DIAGNOSIS — R05 Cough: Secondary | ICD-10-CM | POA: Diagnosis not present

## 2015-07-28 DIAGNOSIS — C3411 Malignant neoplasm of upper lobe, right bronchus or lung: Principal | ICD-10-CM | POA: Diagnosis present

## 2015-07-28 DIAGNOSIS — Z8249 Family history of ischemic heart disease and other diseases of the circulatory system: Secondary | ICD-10-CM

## 2015-07-28 DIAGNOSIS — J9382 Other air leak: Secondary | ICD-10-CM | POA: Diagnosis not present

## 2015-07-28 DIAGNOSIS — Z681 Body mass index (BMI) 19 or less, adult: Secondary | ICD-10-CM

## 2015-07-28 HISTORY — DX: Anxiety disorder, unspecified: F41.9

## 2015-07-28 LAB — COMPREHENSIVE METABOLIC PANEL
ALT: 19 U/L (ref 14–54)
AST: 23 U/L (ref 15–41)
Albumin: 3.7 g/dL (ref 3.5–5.0)
Alkaline Phosphatase: 55 U/L (ref 38–126)
Anion gap: 11 (ref 5–15)
BUN: 16 mg/dL (ref 6–20)
CO2: 22 mmol/L (ref 22–32)
Calcium: 9.4 mg/dL (ref 8.9–10.3)
Chloride: 107 mmol/L (ref 101–111)
Creatinine, Ser: 0.77 mg/dL (ref 0.44–1.00)
GFR calc Af Amer: >60 mL/min (ref >60)
GFR calc non Af Amer: >60 mL/min (ref >60)
Glucose, Bld: 90 mg/dL (ref 65–99)
Potassium: 4.2 mmol/L (ref 3.5–5.1)
Sodium: 140 mmol/L (ref 135–145)
Total Bilirubin: 0.5 mg/dL (ref 0.3–1.2)
Total Protein: 6.2 g/dL — ABNORMAL LOW (ref 6.5–8.1)

## 2015-07-28 LAB — URINALYSIS, ROUTINE W REFLEX MICROSCOPIC
Bilirubin Urine: NEGATIVE
Glucose, UA: NEGATIVE mg/dL
Hgb urine dipstick: NEGATIVE
Ketones, ur: NEGATIVE mg/dL
Leukocytes, UA: NEGATIVE
Nitrite: NEGATIVE
Protein, ur: NEGATIVE mg/dL
Specific Gravity, Urine: 1.005 (ref 1.005–1.030)
pH: 7 (ref 5.0–8.0)

## 2015-07-28 LAB — SURGICAL PCR SCREEN
MRSA, PCR: NEGATIVE
Staphylococcus aureus: NEGATIVE

## 2015-07-28 LAB — BLOOD GAS, ARTERIAL
Acid-Base Excess: 0.3 mmol/L (ref 0.0–2.0)
Bicarbonate: 24.1 mEq/L — ABNORMAL HIGH (ref 20.0–24.0)
Drawn by: 206361
FIO2: 0.21
O2 Saturation: 98.2 %
Patient temperature: 98.6
TCO2: 25.2 mmol/L (ref 0–100)
pCO2 arterial: 36.2 mmHg (ref 35.0–45.0)
pH, Arterial: 7.437 (ref 7.350–7.450)
pO2, Arterial: 110 mmHg — ABNORMAL HIGH (ref 80.0–100.0)

## 2015-07-28 LAB — CBC
HCT: 42.7 % (ref 36.0–46.0)
Hemoglobin: 14.2 g/dL (ref 12.0–15.0)
MCH: 30.1 pg (ref 26.0–34.0)
MCHC: 33.3 g/dL (ref 30.0–36.0)
MCV: 90.5 fL (ref 78.0–100.0)
Platelets: 226 10*3/uL (ref 150–400)
RBC: 4.72 MIL/uL (ref 3.87–5.11)
RDW: 12.9 % (ref 11.5–15.5)
WBC: 10.8 10*3/uL — ABNORMAL HIGH (ref 4.0–10.5)

## 2015-07-28 LAB — ABO/RH: ABO/RH(D): O NEG

## 2015-07-28 LAB — PROTIME-INR
INR: 1.01 (ref 0.00–1.49)
Prothrombin Time: 13.5 seconds (ref 11.6–15.2)

## 2015-07-28 LAB — TYPE AND SCREEN
ABO/RH(D): O NEG
Antibody Screen: NEGATIVE

## 2015-07-28 LAB — APTT: aPTT: 35 seconds (ref 24–37)

## 2015-07-28 NOTE — Telephone Encounter (Signed)
Pt contacted our office stating that her Bronchoscopy was denied by insurance.  Will send to [facility] to contact [place] in AM 07/29/15 to figure out why coverage was denied and next steps.  Please advise. Thanks.

## 2015-07-28 NOTE — Telephone Encounter (Signed)
Will convert to triage message to send to [facility] to contact [place] in AM 07/29/15 to figure out why coverage was denied and next steps.  See Telephone note 07/28/15

## 2015-07-28 NOTE — Pre-Procedure Instructions (Signed)
[name] West  07/28/2015      [facility] [address] [name], [name] [address] Phone: [phone] Fax: [phone]    Your procedure is scheduled on  Thursday  07/30/15  Report to [facility] Admitting at 530 A.M.  Call this number if you have problems the morning of surgery:  [phone]   Remember:  Do not eat food or drink liquids after midnight.  Take these medicines the morning of surgery with A SIP OF WATER  CITALOPRAM (CELEXA), ALLEGRA      (STOP ASPIRIN, IBUPROFEN/ ADVIL/ MOTRIN, GOODY POWDERS/ BC'S, VITAMINS, HERBAL MEDICINES, TART CHERRY, MULTIVITAMIN)   Do not wear jewelry, make-up or nail polish.  Do not wear lotions, powders, or perfumes.  You may wear deodorant.  Do not shave 48 hours prior to surgery.  Men may shave face and neck.  Do not bring valuables to the hospital.  [facility] is not responsible for any belongings or valuables.  Contacts, dentures or bridgework may not be worn into surgery.  Leave your suitcase in the car.  After surgery it may be brought to your room.  For patients admitted to the hospital, discharge time will be determined by your treatment team.  Patients discharged the day of surgery will not be allowed to drive home.   Name and phone number of your driver:   Special instructions:  [name] - Preparing for Surgery  Before surgery, you can play an important role.  Because skin is not sterile, your skin needs to be as free of germs as possible.  You can reduce the number of germs on you skin by washing with CHG (chlorahexidine gluconate) soap before surgery.  CHG is an antiseptic cleaner which kills germs and bonds with the skin to continue killing germs even after washing.  Please DO NOT use if you have an allergy to CHG or antibacterial soaps.  If your skin becomes reddened/irritated stop using the CHG and inform your nurse when you arrive at Short  Stay.  Do not shave (including legs and underarms) for at least 48 hours prior to the first CHG shower.  You may shave your face.  Please follow these instructions carefully:   1.  Shower with CHG Soap the night before surgery and the                                morning of Surgery.  2.  If you choose to wash your hair, wash your hair first as usual with your       normal shampoo.  3.  After you shampoo, rinse your hair and body thoroughly to remove the                      Shampoo.  4.  Use CHG as you would any other liquid soap.  You can apply chg directly       to the skin and wash gently with scrungie or a clean washcloth.  5.  Apply the CHG Soap to your body ONLY FROM THE NECK DOWN.        Do not use on open wounds or open sores.  Avoid contact with your eyes,       ears, mouth and genitals (private parts).  Wash genitals (private parts)       with your normal soap.  6.  Wash thoroughly, paying special attention to the area where your surgery        will be performed.  7.  Thoroughly rinse your body with warm water from the neck down.  8.  DO NOT shower/wash with your normal soap after using and rinsing off       the CHG Soap.  9.  Pat yourself dry with a clean towel.            10.  Wear clean pajamas.            11.  Place clean sheets on your bed the night of your first shower and do not        sleep with pets.  Day of Surgery  Do not apply any lotions/deoderants the morning of surgery.  Please wear clean clothes to the hospital/surgery center.    Please read over the following fact sheets that you were given. Pain Booklet, Coughing and Deep Breathing, Blood Transfusion Information, MRSA Information and Surgical Site Infection Prevention

## 2015-07-29 MED ORDER — VANCOMYCIN HCL IN DEXTROSE 1-5 GM/200ML-% IV SOLN
1000.0000 mg | INTRAVENOUS | Status: AC
  Administered 2015-07-30: 1000 mg via INTRAVENOUS
  Filled 2015-07-29: qty 200

## 2015-07-30 ENCOUNTER — Inpatient Hospital Stay (HOSPITAL_COMMUNITY): Payer: BC Managed Care – PPO

## 2015-07-30 ENCOUNTER — Encounter (HOSPITAL_COMMUNITY)
Admission: RE | Disposition: A | Discharge status: Home / Self Care | Payer: Self-pay | Source: Ambulatory Visit | Attending: Thoracic Surgery (Cardiothoracic Vascular Surgery)

## 2015-07-30 ENCOUNTER — Inpatient Hospital Stay (HOSPITAL_COMMUNITY): Payer: BC Managed Care – PPO | Admitting: Certified Registered Nurse Anesthetist

## 2015-07-30 ENCOUNTER — Encounter (HOSPITAL_COMMUNITY): Payer: Self-pay | Admitting: Surgery

## 2015-07-30 ENCOUNTER — Inpatient Hospital Stay (HOSPITAL_COMMUNITY)
Admission: RE | Admit: 2015-07-30 | Discharge: 2015-08-04 | DRG: 164 | Disposition: A | Discharge status: Home / Self Care | Payer: BC Managed Care – PPO | Source: Ambulatory Visit | Attending: Thoracic Surgery (Cardiothoracic Vascular Surgery) | Admitting: Thoracic Surgery (Cardiothoracic Vascular Surgery)

## 2015-07-30 DIAGNOSIS — Z681 Body mass index (BMI) 19 or less, adult: Secondary | ICD-10-CM | POA: Diagnosis not present

## 2015-07-30 DIAGNOSIS — Z8249 Family history of ischemic heart disease and other diseases of the circulatory system: Secondary | ICD-10-CM | POA: Diagnosis not present

## 2015-07-30 DIAGNOSIS — K5900 Constipation, unspecified: Secondary | ICD-10-CM | POA: Diagnosis not present

## 2015-07-30 DIAGNOSIS — C3411 Malignant neoplasm of upper lobe, right bronchus or lung: Secondary | ICD-10-CM | POA: Diagnosis present

## 2015-07-30 DIAGNOSIS — M1990 Unspecified osteoarthritis, unspecified site: Secondary | ICD-10-CM | POA: Diagnosis present

## 2015-07-30 DIAGNOSIS — C801 Malignant (primary) neoplasm, unspecified: Secondary | ICD-10-CM

## 2015-07-30 DIAGNOSIS — R05 Cough: Secondary | ICD-10-CM | POA: Diagnosis not present

## 2015-07-30 DIAGNOSIS — Z01818 Encounter for other preprocedural examination: Secondary | ICD-10-CM

## 2015-07-30 DIAGNOSIS — T40605A Adverse effect of unspecified narcotics, initial encounter: Secondary | ICD-10-CM | POA: Diagnosis not present

## 2015-07-30 DIAGNOSIS — K5903 Drug induced constipation: Secondary | ICD-10-CM | POA: Diagnosis not present

## 2015-07-30 DIAGNOSIS — E8770 Fluid overload, unspecified: Secondary | ICD-10-CM | POA: Diagnosis not present

## 2015-07-30 DIAGNOSIS — Z902 Acquired absence of lung [part of]: Secondary | ICD-10-CM

## 2015-07-30 DIAGNOSIS — C771 Secondary and unspecified malignant neoplasm of intrathoracic lymph nodes: Secondary | ICD-10-CM | POA: Diagnosis present

## 2015-07-30 DIAGNOSIS — Z09 Encounter for follow-up examination after completed treatment for conditions other than malignant neoplasm: Secondary | ICD-10-CM

## 2015-07-30 DIAGNOSIS — Z4682 Encounter for fitting and adjustment of non-vascular catheter: Secondary | ICD-10-CM

## 2015-07-30 DIAGNOSIS — J9382 Other air leak: Secondary | ICD-10-CM | POA: Diagnosis not present

## 2015-07-30 HISTORY — PX: VIDEO ASSISTED THORACOSCOPY (VATS)/ LOBECTOMY: SHX6169

## 2015-07-30 SURGERY — VIDEO ASSISTED THORACOSCOPY (VATS)/ LOBECTOMY
Anesthesia: General | Site: Chest | Laterality: Right

## 2015-07-30 MED ORDER — FENTANYL 40 MCG/ML IV SOLN
INTRAVENOUS | Status: DC
  Administered 2015-07-30: 150 ug via INTRAVENOUS
  Administered 2015-07-30 (×2): 30 ug via INTRAVENOUS
  Administered 2015-07-31: 40 ug via INTRAVENOUS
  Administered 2015-07-31: 45 ug via INTRAVENOUS
  Administered 2015-07-31: 180 ug via INTRAVENOUS
  Administered 2015-07-31: 50 ug via INTRAVENOUS
  Administered 2015-07-31: 40 ug via INTRAVENOUS
  Administered 2015-07-31: 60 ug via INTRAVENOUS
  Administered 2015-08-01: 25 ug via INTRAVENOUS
  Administered 2015-08-01: 50 ug via INTRAVENOUS
  Administered 2015-08-01: 105 ug via INTRAVENOUS
  Administered 2015-08-02: 60 ug via INTRAVENOUS
  Administered 2015-08-02: 30 ug via INTRAVENOUS
  Administered 2015-08-02: 10 ug via INTRAVENOUS
  Filled 2015-07-30: qty 25

## 2015-07-30 MED ORDER — GLYCOPYRROLATE 0.2 MG/ML IJ SOLN
INTRAMUSCULAR | Status: AC
  Filled 2015-07-30: qty 1

## 2015-07-30 MED ORDER — METOCLOPRAMIDE HCL 5 MG/ML IJ SOLN
10.0000 mg | Freq: Four times a day (QID) | INTRAMUSCULAR | Status: AC
  Administered 2015-07-30 – 2015-07-31 (×3): 10 mg via INTRAVENOUS
  Filled 2015-07-30 (×3): qty 2

## 2015-07-30 MED ORDER — GLYCOPYRROLATE 0.2 MG/ML IJ SOLN
INTRAMUSCULAR | Status: DC | PRN
  Administered 2015-07-30: 0.2 mg via INTRAVENOUS

## 2015-07-30 MED ORDER — DEXAMETHASONE SODIUM PHOSPHATE 10 MG/ML IJ SOLN
INTRAMUSCULAR | Status: DC | PRN
  Administered 2015-07-30: 4 mg via INTRAVENOUS

## 2015-07-30 MED ORDER — FENTANYL CITRATE (PF) 250 MCG/5ML IJ SOLN
INTRAMUSCULAR | Status: AC
  Filled 2015-07-30: qty 5

## 2015-07-30 MED ORDER — MIDAZOLAM HCL 2 MG/2ML IJ SOLN
INTRAMUSCULAR | Status: AC
  Filled 2015-07-30: qty 2

## 2015-07-30 MED ORDER — SUGAMMADEX SODIUM 200 MG/2ML IV SOLN
INTRAVENOUS | Status: DC | PRN
  Administered 2015-07-30: 100 mg via INTRAVENOUS

## 2015-07-30 MED ORDER — ONDANSETRON HCL 4 MG/2ML IJ SOLN: 4.0000 mg | Freq: Four times a day (QID) | INTRAMUSCULAR | Status: DC | PRN

## 2015-07-30 MED ORDER — FENTANYL 40 MCG/ML IV SOLN
INTRAVENOUS | Status: AC
  Administered 2015-07-30: 30 ug via INTRAVENOUS
  Filled 2015-07-30: qty 25

## 2015-07-30 MED ORDER — ONDANSETRON HCL 4 MG/2ML IJ SOLN
INTRAMUSCULAR | Status: DC | PRN
  Administered 2015-07-30: 4 mg via INTRAVENOUS

## 2015-07-30 MED ORDER — OXYCODONE HCL 5 MG/5ML PO SOLN: 5.0000 mg | Freq: Once | ORAL | Status: AC | PRN

## 2015-07-30 MED ORDER — HYDROMORPHONE HCL 1 MG/ML IJ SOLN
INTRAMUSCULAR | Status: AC
  Filled 2015-07-30: qty 1

## 2015-07-30 MED ORDER — ACETAMINOPHEN 160 MG/5ML PO SOLN
325.0000 mg | ORAL | Status: DC | PRN
  Filled 2015-07-30: qty 20.3

## 2015-07-30 MED ORDER — TRAMADOL HCL 50 MG PO TABS
50.0000 mg | ORAL_TABLET | Freq: Four times a day (QID) | ORAL | Status: DC | PRN
  Administered 2015-07-31: 50 mg via ORAL
  Administered 2015-08-02 – 2015-08-03 (×3): 100 mg via ORAL
  Administered 2015-08-03: 50 mg via ORAL
  Administered 2015-08-03 – 2015-08-04 (×2): 100 mg via ORAL
  Filled 2015-07-30: qty 2
  Filled 2015-07-30: qty 1
  Filled 2015-07-30 (×5): qty 2

## 2015-07-30 MED ORDER — POTASSIUM CHLORIDE 10 MEQ/50ML IV SOLN: 10.0000 meq | Freq: Every day | INTRAVENOUS | Status: DC | PRN

## 2015-07-30 MED ORDER — NALOXONE HCL 0.4 MG/ML IJ SOLN: 0.4000 mg | INTRAMUSCULAR | Status: DC | PRN

## 2015-07-30 MED ORDER — CITALOPRAM HYDROBROMIDE 10 MG PO TABS
10.0000 mg | ORAL_TABLET | Freq: Every morning | ORAL | Status: DC
  Administered 2015-07-31 – 2015-08-03 (×4): 10 mg via ORAL
  Filled 2015-07-30 (×4): qty 1

## 2015-07-30 MED ORDER — OXYCODONE HCL 5 MG PO TABS
ORAL_TABLET | ORAL | Status: AC
  Administered 2015-07-30: 5 mg via ORAL
  Filled 2015-07-30: qty 1

## 2015-07-30 MED ORDER — HYDROMORPHONE HCL 1 MG/ML IJ SOLN
INTRAMUSCULAR | Status: AC
  Administered 2015-07-30: 0.5 mg via INTRAVENOUS
  Filled 2015-07-30: qty 1

## 2015-07-30 MED ORDER — SODIUM CHLORIDE 0.9% FLUSH: 9.0000 mL | INTRAVENOUS | Status: DC | PRN

## 2015-07-30 MED ORDER — ONDANSETRON HCL 4 MG/2ML IJ SOLN
INTRAMUSCULAR | Status: AC
  Filled 2015-07-30: qty 2

## 2015-07-30 MED ORDER — PROPOFOL 10 MG/ML IV BOLUS
INTRAVENOUS | Status: AC
  Filled 2015-07-30: qty 20

## 2015-07-30 MED ORDER — ACETAMINOPHEN 500 MG PO TABS
1000.0000 mg | ORAL_TABLET | Freq: Four times a day (QID) | ORAL | Status: DC
  Administered 2015-07-30 – 2015-08-04 (×14): 1000 mg via ORAL
  Filled 2015-07-30 (×14): qty 2

## 2015-07-30 MED ORDER — SCOPOLAMINE 1 MG/3DAYS TD PT72
MEDICATED_PATCH | TRANSDERMAL | Status: AC
  Filled 2015-07-30: qty 1

## 2015-07-30 MED ORDER — SENNOSIDES-DOCUSATE SODIUM 8.6-50 MG PO TABS
1.0000 | ORAL_TABLET | Freq: Every day | ORAL | Status: DC
  Administered 2015-07-30 – 2015-08-03 (×5): 1 via ORAL
  Filled 2015-07-30 (×5): qty 1

## 2015-07-30 MED ORDER — SUCCINYLCHOLINE CHLORIDE 20 MG/ML IJ SOLN
INTRAMUSCULAR | Status: AC
  Filled 2015-07-30: qty 1

## 2015-07-30 MED ORDER — ACETAMINOPHEN 325 MG PO TABS: 325.0000 mg | ORAL_TABLET | ORAL | Status: DC | PRN

## 2015-07-30 MED ORDER — ROCURONIUM BROMIDE 50 MG/5ML IV SOLN
INTRAVENOUS | Status: AC
  Filled 2015-07-30: qty 1

## 2015-07-30 MED ORDER — POTASSIUM CHLORIDE IN NACL 20-0.9 MEQ/L-% IV SOLN
INTRAVENOUS | Status: DC
  Administered 2015-07-30 – 2015-08-01 (×3): via INTRAVENOUS
  Filled 2015-07-30 (×3): qty 1000

## 2015-07-30 MED ORDER — HYDROMORPHONE HCL 1 MG/ML IJ SOLN
INTRAMUSCULAR | Status: DC | PRN
  Administered 2015-07-30 (×2): 0.5 mg via INTRAVENOUS

## 2015-07-30 MED ORDER — DIPHENHYDRAMINE HCL 50 MG/ML IJ SOLN: 12.5000 mg | Freq: Four times a day (QID) | INTRAMUSCULAR | Status: DC | PRN

## 2015-07-30 MED ORDER — DIPHENHYDRAMINE HCL 12.5 MG/5ML PO ELIX: 12.5000 mg | ORAL_SOLUTION | Freq: Four times a day (QID) | ORAL | Status: DC | PRN

## 2015-07-30 MED ORDER — FENTANYL CITRATE (PF) 250 MCG/5ML IJ SOLN
INTRAMUSCULAR | Status: AC
Start: 2015-07-30 — End: 2015-07-30
  Filled 2015-07-30: qty 5

## 2015-07-30 MED ORDER — VITAMIN D 1000 UNITS PO TABS
1000.0000 [IU] | ORAL_TABLET | Freq: Every day | ORAL | Status: DC
  Administered 2015-07-31 – 2015-08-03 (×4): 1000 [IU] via ORAL
  Filled 2015-07-30 (×4): qty 1

## 2015-07-30 MED ORDER — OXYCODONE HCL 5 MG PO TABS
5.0000 mg | ORAL_TABLET | ORAL | Status: DC | PRN
  Administered 2015-07-30 – 2015-08-02 (×8): 10 mg via ORAL
  Administered 2015-08-03: 5 mg via ORAL
  Filled 2015-07-30 (×8): qty 2
  Filled 2015-07-30: qty 1

## 2015-07-30 MED ORDER — SUGAMMADEX SODIUM 200 MG/2ML IV SOLN
INTRAVENOUS | Status: AC
  Filled 2015-07-30: qty 2

## 2015-07-30 MED ORDER — LIDOCAINE HCL (CARDIAC) 20 MG/ML IV SOLN
INTRAVENOUS | Status: AC
  Filled 2015-07-30: qty 5

## 2015-07-30 MED ORDER — ZOLPIDEM TARTRATE 5 MG PO TABS
5.0000 mg | ORAL_TABLET | Freq: Every evening | ORAL | Status: DC | PRN
  Administered 2015-07-31 – 2015-08-03 (×4): 5 mg via ORAL
  Filled 2015-07-30 (×4): qty 1

## 2015-07-30 MED ORDER — 0.9 % SODIUM CHLORIDE (POUR BTL) OPTIME
TOPICAL | Status: DC | PRN
  Administered 2015-07-30 (×2): 1000 mL

## 2015-07-30 MED ORDER — LACTATED RINGERS IV SOLN
INTRAVENOUS | Status: DC | PRN
  Administered 2015-07-30 (×3): via INTRAVENOUS

## 2015-07-30 MED ORDER — PHENYLEPHRINE HCL 10 MG/ML IJ SOLN
10.0000 mg | INTRAVENOUS | Status: DC | PRN
  Administered 2015-07-30: 25 ug/min via INTRAVENOUS

## 2015-07-30 MED ORDER — BISACODYL 5 MG PO TBEC
10.0000 mg | DELAYED_RELEASE_TABLET | Freq: Every day | ORAL | Status: DC
  Administered 2015-07-31 – 2015-08-03 (×4): 10 mg via ORAL
  Filled 2015-07-30 (×4): qty 2

## 2015-07-30 MED ORDER — BRIMONIDINE TARTRATE 0.33 % EX GEL
1.0000 "application " | Freq: Every day | CUTANEOUS | Status: DC
  Filled 2015-07-30: qty 1

## 2015-07-30 MED ORDER — LORATADINE 10 MG PO TABS
10.0000 mg | ORAL_TABLET | Freq: Every day | ORAL | Status: DC
  Administered 2015-07-31 – 2015-08-03 (×5): 10 mg via ORAL
  Filled 2015-07-30 (×4): qty 1

## 2015-07-30 MED ORDER — FENTANYL CITRATE (PF) 100 MCG/2ML IJ SOLN
INTRAMUSCULAR | Status: DC | PRN
  Administered 2015-07-30: 50 ug via INTRAVENOUS
  Administered 2015-07-30: 150 ug via INTRAVENOUS
  Administered 2015-07-30: 25 ug via INTRAVENOUS
  Administered 2015-07-30: 50 ug via INTRAVENOUS
  Administered 2015-07-30 (×2): 25 ug via INTRAVENOUS
  Administered 2015-07-30 (×2): 50 ug via INTRAVENOUS
  Administered 2015-07-30 (×2): 25 ug via INTRAVENOUS

## 2015-07-30 MED ORDER — SCOPOLAMINE 1 MG/3DAYS TD PT72
MEDICATED_PATCH | TRANSDERMAL | Status: DC | PRN
  Administered 2015-07-30: 1 via TRANSDERMAL

## 2015-07-30 MED ORDER — DIPHENHYDRAMINE HCL 50 MG/ML IJ SOLN
INTRAMUSCULAR | Status: DC | PRN
  Administered 2015-07-30: 12.5 mg via INTRAVENOUS

## 2015-07-30 MED ORDER — PROPOFOL 10 MG/ML IV BOLUS
INTRAVENOUS | Status: DC | PRN
  Administered 2015-07-30: 170 mg via INTRAVENOUS

## 2015-07-30 MED ORDER — ONDANSETRON HCL 4 MG/2ML IJ SOLN
4.0000 mg | Freq: Four times a day (QID) | INTRAMUSCULAR | Status: DC | PRN
  Administered 2015-07-31 (×2): 4 mg via INTRAVENOUS
  Filled 2015-07-30 (×2): qty 2

## 2015-07-30 MED ORDER — VANCOMYCIN HCL IN DEXTROSE 1-5 GM/200ML-% IV SOLN
1000.0000 mg | Freq: Two times a day (BID) | INTRAVENOUS | Status: AC
  Administered 2015-07-30: 1000 mg via INTRAVENOUS
  Filled 2015-07-30: qty 200

## 2015-07-30 MED ORDER — DIPHENHYDRAMINE HCL 50 MG/ML IJ SOLN
INTRAMUSCULAR | Status: AC
  Filled 2015-07-30: qty 1

## 2015-07-30 MED ORDER — OXYCODONE HCL 5 MG PO TABS
5.0000 mg | ORAL_TABLET | Freq: Once | ORAL | Status: AC | PRN
  Administered 2015-07-30: 5 mg via ORAL

## 2015-07-30 MED ORDER — ACETAMINOPHEN 160 MG/5ML PO SOLN: 1000.0000 mg | Freq: Four times a day (QID) | ORAL | Status: DC

## 2015-07-30 MED ORDER — ROCURONIUM BROMIDE 100 MG/10ML IV SOLN
INTRAVENOUS | Status: DC | PRN
  Administered 2015-07-30: 30 mg via INTRAVENOUS
  Administered 2015-07-30: 50 mg via INTRAVENOUS
  Administered 2015-07-30: 20 mg via INTRAVENOUS
  Administered 2015-07-30: 30 mg via INTRAVENOUS

## 2015-07-30 MED ORDER — MIDAZOLAM HCL 5 MG/5ML IJ SOLN
INTRAMUSCULAR | Status: DC | PRN
Start: 2015-07-30 — End: 2015-07-30
  Administered 2015-07-30: 2 mg via INTRAVENOUS

## 2015-07-30 MED ORDER — LIDOCAINE HCL (CARDIAC) 20 MG/ML IV SOLN
INTRAVENOUS | Status: DC | PRN
  Administered 2015-07-30: 60 mg via INTRAVENOUS

## 2015-07-30 MED ORDER — ADULT MULTIVITAMIN W/MINERALS CH
1.0000 | ORAL_TABLET | Freq: Every day | ORAL | Status: DC
  Administered 2015-07-31 – 2015-08-03 (×4): 1 via ORAL
  Filled 2015-07-30 (×4): qty 1

## 2015-07-30 MED ORDER — DEXAMETHASONE SODIUM PHOSPHATE 4 MG/ML IJ SOLN
INTRAMUSCULAR | Status: AC
  Filled 2015-07-30: qty 1

## 2015-07-30 MED ORDER — HYDROMORPHONE HCL 1 MG/ML IJ SOLN
0.2500 mg | INTRAMUSCULAR | Status: DC | PRN
  Administered 2015-07-30 (×4): 0.5 mg via INTRAVENOUS

## 2015-07-30 MED ORDER — LEVALBUTEROL HCL 0.63 MG/3ML IN NEBU
0.6300 mg | INHALATION_SOLUTION | Freq: Four times a day (QID) | RESPIRATORY_TRACT | Status: DC
  Administered 2015-07-31: 0.63 mg via RESPIRATORY_TRACT
  Filled 2015-07-30 (×3): qty 3

## 2015-07-30 MED ORDER — ACETAMINOPHEN 10 MG/ML IV SOLN
INTRAVENOUS | Status: DC | PRN
  Administered 2015-07-30: 1000 mg via INTRAVENOUS

## 2015-07-30 SURGICAL SUPPLY — 92 items
APPLIER CLIP ROT 10 11.4 M/L (STAPLE) ×2
CANISTER SUCTION 2500CC (MISCELLANEOUS) ×2 IMPLANT
CATH KIT ON Q 5IN SLV (PAIN MANAGEMENT) IMPLANT
CATH THORACIC 28FR (CATHETERS) ×2 IMPLANT
CATH THORACIC 28FR RT ANG (CATHETERS) IMPLANT
CATH THORACIC 36FR (CATHETERS) IMPLANT
CATH THORACIC 36FR RT ANG (CATHETERS) IMPLANT
CLIP APPLIE ROT 10 11.4 M/L (STAPLE) ×1 IMPLANT
CLIP TI MEDIUM 6 (CLIP) ×2 IMPLANT
CLIP TI WIDE RED SMALL 6 (CLIP) ×4 IMPLANT
CONN ST 1/4X3/8  BEN (MISCELLANEOUS) ×1
CONN ST 1/4X3/8 BEN (MISCELLANEOUS) ×1 IMPLANT
CONN Y 3/8X3/8X3/8  BEN (MISCELLANEOUS)
CONN Y 3/8X3/8X3/8 BEN (MISCELLANEOUS) IMPLANT
CONT SPEC 4OZ CLIKSEAL STRL BL (MISCELLANEOUS) ×28 IMPLANT
COVER SURGICAL LIGHT HANDLE (MISCELLANEOUS) IMPLANT
CUTTER ECHEON FLEX ENDO 45 340 (ENDOMECHANICALS) ×2 IMPLANT
DERMABOND ADVANCED (GAUZE/BANDAGES/DRESSINGS) ×1
DERMABOND ADVANCED .7 DNX12 (GAUZE/BANDAGES/DRESSINGS) ×1 IMPLANT
DRAIN CHANNEL 28F RND 3/8 FF (WOUND CARE) ×4 IMPLANT
DRAPE LAPAROSCOPIC ABDOMINAL (DRAPES) ×2 IMPLANT
DRAPE WARM FLUID 44X44 (DRAPE) ×2 IMPLANT
ELECT BLADE 6.5 EXT (BLADE) ×2 IMPLANT
ELECT REM PT RETURN 9FT ADLT (ELECTROSURGICAL) ×2
ELECTRODE REM PT RTRN 9FT ADLT (ELECTROSURGICAL) ×1 IMPLANT
GAUZE SPONGE 4X4 12PLY STRL (GAUZE/BANDAGES/DRESSINGS) IMPLANT
GLOVE BIO SURGEON STRL SZ 6.5 (GLOVE) ×2 IMPLANT
GLOVE BIOGEL PI IND STRL 6.5 (GLOVE) ×4 IMPLANT
GLOVE BIOGEL PI INDICATOR 6.5 (GLOVE) ×4
GLOVE ECLIPSE 6.5 STRL STRAW (GLOVE) ×2 IMPLANT
GLOVE SURG SIGNA 7.5 PF LTX (GLOVE) ×4 IMPLANT
GOWN STRL REUS W/ TWL LRG LVL3 (GOWN DISPOSABLE) ×3 IMPLANT
GOWN STRL REUS W/ TWL XL LVL3 (GOWN DISPOSABLE) ×1 IMPLANT
GOWN STRL REUS W/TWL LRG LVL3 (GOWN DISPOSABLE) ×3
GOWN STRL REUS W/TWL XL LVL3 (GOWN DISPOSABLE) ×1
HEMOSTAT SURGICEL 2X14 (HEMOSTASIS) IMPLANT
KIT BASIN OR (CUSTOM PROCEDURE TRAY) ×2 IMPLANT
KIT ROOM TURNOVER OR (KITS) ×2 IMPLANT
KIT SUCTION CATH 14FR (SUCTIONS) IMPLANT
NS IRRIG 1000ML POUR BTL (IV SOLUTION) ×6 IMPLANT
PACK CHEST (CUSTOM PROCEDURE TRAY) ×2 IMPLANT
PAD ARMBOARD 7.5X6 YLW CONV (MISCELLANEOUS) ×4 IMPLANT
POUCH ENDO CATCH II 15MM (MISCELLANEOUS) ×2 IMPLANT
POUCH SPECIMEN RETRIEVAL 10MM (ENDOMECHANICALS) IMPLANT
RELOAD 45 THICK GREEN (ENDOMECHANICALS) ×2 IMPLANT
RELOAD GOLD ECHELON 45 (STAPLE) ×24 IMPLANT
RELOAD GREEN ECHELON 45 (STAPLE) ×6 IMPLANT
SCISSORS ENDO CVD 5DCS (MISCELLANEOUS) IMPLANT
SEALANT PROGEL (MISCELLANEOUS) IMPLANT
SEALANT SURG COSEAL 4ML (VASCULAR PRODUCTS) IMPLANT
SEALANT SURG COSEAL 8ML (VASCULAR PRODUCTS) IMPLANT
SOLUTION ANTI FOG 6CC (MISCELLANEOUS) ×2 IMPLANT
SPECIMEN JAR MEDIUM (MISCELLANEOUS) IMPLANT
SPONGE GAUZE 4X4 12PLY STER LF (GAUZE/BANDAGES/DRESSINGS) ×2 IMPLANT
SPONGE INTESTINAL PEANUT (DISPOSABLE) ×4 IMPLANT
SPONGE TONSIL 1 RF SGL (DISPOSABLE) ×2 IMPLANT
STAPLE RELOAD 2.5MM WHITE (STAPLE) ×14 IMPLANT
STAPLER ENDO NO KNIFE (STAPLE) ×2 IMPLANT
STAPLER VASCULAR ECHELON 35 (CUTTER) ×2 IMPLANT
SUT PDS AB 3-0 SH 27 (SUTURE) ×10 IMPLANT
SUT PROLENE 4 0 RB 1 (SUTURE) ×1
SUT PROLENE 4-0 RB1 .5 CRCL 36 (SUTURE) ×1 IMPLANT
SUT SILK  1 MH (SUTURE) ×4
SUT SILK 1 MH (SUTURE) ×4 IMPLANT
SUT SILK 1 TIES 10X30 (SUTURE) ×2 IMPLANT
SUT SILK 2 0 SH (SUTURE) IMPLANT
SUT SILK 2 0SH CR/8 30 (SUTURE) IMPLANT
SUT SILK 3 0 SH 30 (SUTURE) IMPLANT
SUT SILK 3 0SH CR/8 30 (SUTURE) IMPLANT
SUT VIC AB 1 CTX 27 (SUTURE) ×2 IMPLANT
SUT VIC AB 1 CTX 36 (SUTURE) ×3
SUT VIC AB 1 CTX36XBRD ANBCTR (SUTURE) ×3 IMPLANT
SUT VIC AB 2-0 CTX 36 (SUTURE) ×4 IMPLANT
SUT VIC AB 2-0 UR6 27 (SUTURE) IMPLANT
SUT VIC AB 3-0 MH 27 (SUTURE) IMPLANT
SUT VIC AB 3-0 SH 27 (SUTURE) ×3
SUT VIC AB 3-0 SH 27X BRD (SUTURE) ×3 IMPLANT
SUT VIC AB 3-0 X1 27 (SUTURE) ×2 IMPLANT
SUT VICRYL 2 TP 1 (SUTURE) IMPLANT
SWAB COLLECTION DEVICE MRSA (MISCELLANEOUS) IMPLANT
SYSTEM SAHARA CHEST DRAIN ATS (WOUND CARE) ×2 IMPLANT
TAPE CLOTH 4X10 WHT NS (GAUZE/BANDAGES/DRESSINGS) ×2 IMPLANT
TAPE PAPER 3X10 WHT MICROPORE (GAUZE/BANDAGES/DRESSINGS) ×2 IMPLANT
TIP APPLICATOR SPRAY EXTEND 16 (VASCULAR PRODUCTS) IMPLANT
TOWEL OR 17X24 6PK STRL BLUE (TOWEL DISPOSABLE) ×2 IMPLANT
TOWEL OR 17X26 10 PK STRL BLUE (TOWEL DISPOSABLE) ×2 IMPLANT
TRAP SPECIMEN MUCOUS 40CC (MISCELLANEOUS) IMPLANT
TRAY FOLEY CATH 16FRSI W/METER (SET/KITS/TRAYS/PACK) ×2 IMPLANT
TROCAR XCEL BLADELESS 5X75MML (TROCAR) ×2 IMPLANT
TUBE ANAEROBIC SPECIMEN COL (MISCELLANEOUS) IMPLANT
TUNNELER SHEATH ON-Q 11GX8 DSP (PAIN MANAGEMENT) IMPLANT
WATER STERILE IRR 1000ML POUR (IV SOLUTION) ×2 IMPLANT

## 2015-07-30 NOTE — Anesthesia Procedure Notes (Addendum)
Procedure Name: Intubation Date/Time: 07/30/2015 8:48 AM Performed by: [place] Pink Pre-anesthesia Checklist: Patient identified, Emergency Drugs available, Suction available and Patient being monitored Patient Re-evaluated:Patient Re-evaluated prior to inductionOxygen Delivery Method: Circle system utilized Preoxygenation: Pre-oxygenation with 100% oxygen Intubation Type: IV induction Ventilation: Oral airway inserted - appropriate to patient size and Mask ventilation without difficulty Laryngoscope Size: Mac Grade View: Grade II Endobronchial tube: EBT position confirmed by auscultation, Double lumen EBT, Left and EBT position confirmed by fiberoptic bronchoscope and 35 Fr Number of attempts: 1 Airway Equipment and Method: Stylet Placement Confirmation: positive ETCO2,  ETT inserted through vocal cords under direct vision and breath sounds checked- equal and bilateral Secured at: 35 (at teeth) cm Tube secured with: Tape Dental Injury: Teeth and Oropharynx as per pre-operative assessment    Central Venous Catheter Insertion Performed by: anesthesiologist Patient location: Pre-op. Preanesthetic checklist: patient identified, IV checked, site marked, risks and benefits discussed, surgical consent, monitors and equipment checked, pre-op evaluation, timeout performed and anesthesia consent Position: Trendelenburg Landmarks identified and Seldinger technique used Catheter size: 8 Fr Central line was placed.Double lumen Procedure performed without using ultrasound guided technique. Attempts: 1 Following insertion, dressing applied, line sutured and Biopatch. Post procedure assessment: blood return through all ports, no air and free fluid flow. Patient tolerated the procedure well with no immediate complications.

## 2015-07-30 NOTE — Anesthesia Preprocedure Evaluation (Addendum)
Anesthesia Evaluation  Patient identified by MRN, date of birth, ID band Patient awake    Reviewed: Allergy & Precautions, NPO status , Patient's Chart, lab work & pertinent test results  History of Anesthesia Complications (+) PONV and history of anesthetic complications  Airway Mallampati: II  TM Distance: >3 FB Neck ROM: Full    Dental  (+) Teeth Intact   Pulmonary  Right lobe cancer   breath sounds clear to auscultation       Cardiovascular negative cardio ROS   Rhythm:Regular     Neuro/Psych PSYCHIATRIC DISORDERS Anxiety negative neurological ROS     GI/Hepatic negative GI ROS, Neg liver ROS,   Endo/Other  negative endocrine ROS  Renal/GU negative Renal ROS     Musculoskeletal  (+) Arthritis ,   Abdominal   Peds  Hematology negative hematology ROS (+)   Anesthesia Other Findings   Reproductive/Obstetrics                            Anesthesia Physical Anesthesia Plan  ASA: II  Anesthesia Plan: General   Post-op Pain Management:    Induction: Intravenous  Airway Management Planned: Double Lumen EBT  Additional Equipment: Arterial line and CVP  Intra-op Plan:   Post-operative Plan: Extubation in OR  Informed Consent: I have reviewed the patients History and Physical, chart, labs and discussed the procedure including the risks, benefits and alternatives for the proposed anesthesia with the patient or authorized representative who has indicated his/her understanding and acceptance.   Dental advisory given  Plan Discussed with: CRNA and Surgeon  Anesthesia Plan Comments:        Anesthesia Quick Evaluation

## 2015-07-30 NOTE — Op Note (Signed)
[name] West, LANGHANS                 ACCOUNT NO.:  [id number]  MEDICAL RECORD NO.:  [id number]  LOCATION:  3S16C                        FACILITY:  [facility]  PHYSICIAN:  Revonda Standard. Roxan Hockey, M.D.DATE OF BIRTH:  Aug 07, 1950  DATE OF PROCEDURE:  07/30/2015 DATE OF DISCHARGE:                              OPERATIVE REPORT   PREOPERATIVE DIAGNOSIS:  Right upper lobe adenocarcinoma, clinical stage Ib.  POSTOPERATIVE DIAGNOSIS:  Right upper lobe adenocarcinoma, clinical stage IIIa.  PROCEDURE:   Right video-assisted thoracoscopy Thoracoscopic right upper lobectomy with bronchoplastic closure and en bloc resection of a wedge from the superior segment of the right lower lobe Mediastinal lymph node dissection.  SURGEON:  Revonda Standard. Roxan Hockey, MD  ASSISTANT:  Ellwood Handler, PA.  ANESTHESIA:  General.  FINDINGS:  5 cm mass posterior lateral aspect, right upper lobe. Obliteration of the fissure between the right upper lobe and superior segment necessitating en bloc wedge of the lower lobe. Multiple enlarged lymph nodes.  Initial bronchial margin showed atypical cells, as well as an adjacent lymph node with metastatic adenocarcinoma. Final bronchial margin had no tumor seen.  CLINICAL NOTE:  [name] West is a 65 year old woman who is a lifelong nonsmoker.  She recently had an episode of chest pain. As part of her workup, a chest x-ray was done which showed an abnormality in the right upper lobe.  A CT of the chest showed a 4.5 cm area of consolidation. PET-CT showed this was hypermetabolic with an SUV of 0.10.  Dr. Lamonte Sakai did navigational bronchoscopy and biopsies were positive for non-small cell carcinoma.  She was advised to undergo surgical resection.  The indications, risks, benefits, and alternatives were discussed in detail with patient.  She understood and accepted the risks and agreed to proceed.  OPERATIVE NOTE:  [name] was brought to the preoperative holding area on July 30, 2015.  Anesthesia placed a central line and an arterial blood pressure monitoring line.  She was taken to the operating room, anesthetized, and intubated with a double-lumen endotracheal tube. A Foley catheter was placed.  Sequential compressive devices were placed for DVT prophylaxis.  Intravenous antibiotics were administered.  She was placed in a left lateral decubitus position and the right chest was prepped and draped in the usual sterile fashion.  Single lung ventilation of the left lung was initiated and was tolerated well throughout the procedure.  After performing a time-out, an incision was made in the 7th interspace in the midaxillary line.  The chest was entered bluntly using a 5 mm port and thoracoscope was advanced in the chest.  There was good isolation of the right lung.  There was no effusion and no abnormality of the parietal pleura.  A small working incision was made in the 4th interspace anterolaterally.  No rib spreading was performed during the procedure.  The mass was immediately evident in the posterolateral aspect of the right upper lobe.  There was obliteration of the fissure between the superior segment and the right upper lobe mass.  The inferior ligament was divided with electrocautery.  The pleural reflection was divided at the hilum anteriorly and posteriorly.  The fissures were incomplete.  The middle  lobe branches of the right superior pulmonary vein were identified and preserved.  The upper lobe branches of the superior pulmonary vein were dissected out and encircled and divided. All lymph nodes that were encountered during the dissection were removed and sent for permanent pathology.  The anterior upper lobe arterial branch now was visible, it was dissected out, encircled and divided with the vascular stapler as well.  Dissection then was carried into the fissure at the confluence of the major and minor fissures.  The pulmonary artery to the lower  lobe was identified and one of the middle lobe branches could be identified from that side.  Multiple firings of the endoscopic stapler using gold cartridges were required to complete the minor fissure.  The posterior ascending branch to the upper lobe was relatively small.  It was encircled and divided with a stapler. The dissection was carried towards the bronchus.  The final upper lobe branch was identified, encircled, and divided with a stapler as well. It was clear that a portion of the superior segment would have to be taken with the right upper lobe to ensure clear margin, again multiple staple cartridges were required to complete the parenchymal division. There were nodes adjacent to the right upper lobe bronchus that were removed with the specimen.  A stapler was placed across the bronchus.  A test inflation showed good aeration of the lower and middle lobes.  The stapler was fired transecting the bronchus.  The right upper lobe was placed into an endoscopic retrieval bag and removed through the incision and sent for frozen section of the bronchial margin.  The subcarinal space was explored and nodes were removed.  The right paratracheal space was explored and level 4R nodes were removed as well.  The frozen section of the bronchial margin showed a lymph node at the margin that had adenocarcinoma.  There also were atypical cells in the bronchial epithelium.  The previous staple line was inspected and there was no way to perform an additional stapling.  The staple line was excised and sent for frozen section.  A bronchoplastic closure was performed with interrupted 3-0 PDS sutures.  After performing the closure, the chest was filled with saline and a test inflation to 30 cm of water showed no leakage from the bronchial closure.  The azygos vein was tacked to the posterior pleura to cover the bronchial closure.  The middle lobe was stapled to the lower lobe to prevent torsion.  The  frozen section on the 2nd bronchial margin returned with no tumor seen.  A 28-French Blake drain was placed through the original port incision and directed posteriorly and a 28-French chest tube was placed through the second port incision and directed anteriorly.  Both were secured at the skin with #1 silk sutures.  The lower and middle lobes were reinflated.  The wound was closed in standard fashion.  Chest tubes were placed to suction.  The patient was extubated in the operating room and taken to the postanesthetic care unit in good condition.     Revonda Standard Roxan Hockey, M.D.     SCH/MEDQ  D:  07/30/2015  T:  07/30/2015  Job:  [id number]

## 2015-07-30 NOTE — H&P (View-Only) (Signed)
PCP is MCNEILL,WENDY, MD Referring Provider is Collene Gobble, MD  Chief Complaint  Patient presents with  . Lung Cancer    PATH 07/15/15.Marland KitchenPET 06/25/15, SUPER-D CHEST CT 07/13/15, PFT 07/21/15    HPI: 65 year old woman sent for consultation regarding a right upper lobe adenocarcinoma.  [name] West is a 65 year old woman who is a lifelong nonsmoker. In January [name] West had an episode of chest pain. This was left-sided chest pain. Her EKG was normal and troponins were negative. A chest x-ray showed a shadow in the right upper lobe. A CT of the chest was done and showed a 4.5 cm area of consolidation in the right upper lobe.  [name] West saw Dr. Lamonte Sakai. A PET CT showed the right upper lobe mass was hypermetabolic with an SUV of 6.96. He did a navigational bronchoscopy on 07/15/2015. Biopsies were positive for non-small cell carcinoma, favor adenocarcinoma.  As noted [name] West is a lifelong nonsmoker. [name] West denies shortness of breath, wheezing and weight loss. [name] West did cough up small amounts of blood after her bronchoscopy but otherwise has not had a cough or hemoptysis. [name] West had the episode of left-sided chest pain in January but has not had any further episodes of that since then. Her exercise tolerance excellent. [name] West walks 30-40 minutes a day. [name] West has not had any unusual headaches or visual changes. [name] West does have arthritis but does not have any new or unusual bone or joint pain. Zubrod Score: At the time of surgery this patient's most appropriate activity status/level should be described as: '[x]'$     0    Normal activity, no symptoms '[]'$     1    Restricted in physical strenuous activity but ambulatory, able to do out light work '[]'$     2    Ambulatory and capable of self care, unable to do work activities, up and about >50 % of waking hours                              '[]'$     3    Only limited self care, in bed greater than 50% of waking hours '[]'$     4    Completely disabled, no self care, confined to bed or chair '[]'$     5     Moribund    Past Medical History  Diagnosis Date  . Arthritis     shoulders  . Ovarian cyst   . Complication of anesthesia     nausea  . PONV (postoperative nausea and vomiting)   . Family history of adverse reaction to anesthesia     Patients grandmother died while having hand surgery; pt unsure of cause  . Environmental and seasonal allergies     Past Surgical History  Procedure Laterality Date  . Right rotator cuff Right   . Cesarean section  1984  . Fibroid removed    . Colonoscopy with propofol N/A 09/27/2012    Procedure: COLONOSCOPY WITH PROPOFOL;  Surgeon: Garlan Fair, MD;  Location: [facility];  Service: Endoscopy;  Laterality: N/A;  . Video bronchoscopy with endobronchial navigation N/A 07/15/2015    Procedure: VIDEO BRONCHOSCOPY WITH ENDOBRONCHIAL NAVIGATION with Biopsy;  Surgeon: Collene Gobble, MD;  Location: [facility];  Service: Thoracic;  Laterality: N/A;    Family History  Father heart disease late in life Mother alive with hypertension No family history of cancer  Social History Social History  Substance Use Topics  . Smoking status: Never  Smoker   . Smokeless tobacco: Never Used  . 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 Azelaic Acid (FINACEA) 15 % cream Apply 1 application topically 2 (two) times daily. After skin is thoroughly washed and patted dry, gently but thoroughly massage a thin film of azelaic acid cream into the affected area twice daily, in the morning and evening.    . Brimonidine Tartrate (MIRVASO) 0.33 % GEL Apply 1 application topically daily.    . cholecalciferol (VITAMIN D) 1000 UNITS tablet Take 1,000 Units by mouth daily.    . citalopram (CELEXA) 10 MG tablet Take 10 mg by mouth every morning.    . fexofenadine (ALLEGRA) 180 MG tablet Take 180 mg by mouth daily.    . Misc Natural Products (TART CHERRY ADVANCED  PO) Take 1 tablet by mouth daily.     . Multiple Vitamin (MULTIVITAMIN WITH MINERALS) TABS Take 1 tablet by mouth daily.    Marland Kitchen zolpidem (AMBIEN) 5 MG tablet Take 5 mg by mouth at bedtime as needed for sleep.     No current facility-administered medications for this visit.    Allergies  Allergen Reactions  . Avelox [Moxifloxacin Hcl In Nacl] Anaphylaxis  . Cephalosporins Anaphylaxis  . Penicillins Anaphylaxis  . Omnipaque [Iohexol] Hives  . Doxycycline Other (See Comments)    Frequently urination   . Adhesive [Tape] Rash  . Lidoderm [Lidocaine] Rash  . Pseudoephedrine Palpitations  . Sulfa Antibiotics Rash    Review of Systems  Constitutional: Negative for fever, chills, activity change, fatigue and unexpected weight change.  HENT: Negative for congestion, trouble swallowing and voice change.   Eyes: Positive for visual disturbance (floaters. No blurred or double vision).  Respiratory: Negative for cough, shortness of breath and wheezing.   Cardiovascular: Positive for chest pain (see HPI).  Gastrointestinal: Negative for abdominal pain, blood in stool and abdominal distention.  Endocrine: Negative for polydipsia, polyphagia and polyuria.  Genitourinary: Negative for dysuria, hematuria and difficulty urinating.  Musculoskeletal: Positive for joint swelling and arthralgias.  Allergic/Immunologic:       Multiple drug allergies  Neurological: Negative for dizziness, syncope and headaches.  Hematological: Negative for adenopathy. Does not bruise/bleed easily.  All other systems reviewed and are negative.   BP 116/79 mmHg  Pulse 85  Resp 16  Ht 4' 11.5" (1.511 m)  Wt 93 lb (42.185 kg)  BMI 18.48 kg/m2  SpO2 98% Physical Exam  Constitutional: [name] West is oriented to person, place, and time. [name] West appears well-developed and well-nourished. No distress.  HENT:  Head: Normocephalic and atraumatic.  Eyes: Conjunctivae and EOM are normal. No scleral icterus.  Neck: Normal range of  motion. Neck supple. No thyromegaly present.  Cardiovascular: Normal rate, regular rhythm, normal heart sounds and intact distal pulses.  Exam reveals no gallop and no friction rub.   No murmur heard. Pulmonary/Chest: Effort normal and breath sounds normal. No respiratory distress. [name] West has no wheezes. [name] West has no rales.  Abdominal: Soft. Bowel sounds are normal. [name] West exhibits no distension. There is no tenderness.  Musculoskeletal: Normal range of motion. [name] West exhibits no edema or tenderness.  Lymphadenopathy:    [name] West has no cervical adenopathy.  Neurological: [name] West is alert and oriented to person, place, and time. No cranial nerve deficit. [name] West exhibits normal muscle tone. Coordination normal.  Skin: Skin is warm and dry.  Psychiatric:  [name] West has a normal mood and affect.  Vitals reviewed.    Diagnostic Tests: CT CHEST WITHOUT CONTRAST  TECHNIQUE: Multidetector CT imaging of the chest was performed using thin slice collimation for electromagnetic bronchoscopy planning purposes, without intravenous contrast.  COMPARISON: PET 06/25/2015 and CT chest 06/21/2015.  FINDINGS: Mediastinum/Lymph Nodes: No pathologically enlarged mediastinal or axillary lymph nodes. Hilar regions are difficult to definitively evaluate without IV contrast. Heart size normal. No pericardial effusion.  Lungs/Pleura: Collapse/ consolidation involves nearly the entire right upper lobe, has a masslike configuration and is stable in size, measuring 3.8 x 4.5 cm. Additional scattered pulmonary nodules measure up to 5 mm, as before. No pleural fluid. Airway is otherwise unremarkable.  Upper abdomen: Visualized portions of the liver and right adrenal gland are unremarkable. Possible left adrenal thickening. Visualized portions of the kidneys, spleen, pancreas and stomach are grossly unremarkable.  Musculoskeletal: No worrisome lytic or sclerotic lesions.  IMPRESSION: 1. Collapse/consolidation involves nearly  the entire right upper lobe and is unchanged in the short interval from 06/21/2015. Given borderline hypermetabolism on 06/25/2015, a post infectious/post inflammatory etiology is considered. However, malignancy cannot be excluded. 2. Additional scattered pulmonary nodules measure 5 mm or less in size. Continued attention on followup exams is warranted.   Electronically Signed  By: Lorin Picket M.D.  On: 07/13/2015 16:19 NUCLEAR MEDICINE PET SKULL BASE TO THIGH  TECHNIQUE: 12.46 mCi F-18 FDG was injected intravenously. Full-ring PET imaging was performed from the skull base to thigh after the radiotracer. CT data was obtained and used for attenuation correction and anatomic localization.  FASTING BLOOD GLUCOSE: Value: 87 mg/dl  COMPARISON: None.  FINDINGS: NECK  No hypermetabolic lymph nodes in the neck.  CHEST  There is a mass within the right upper lobe measuring 4.5 cm. This has an SUV max equal to 2.99. No additional hypermetabolic pulmonary nodules or masses identified. No hypermetabolic mediastinal or hilar lymph nodes identified. There is no supraclavicular or axillary adenopathy.  ABDOMEN/PELVIS  No abnormal hypermetabolic activity within the liver, pancreas, adrenal glands, or spleen. Fluid attenuating structure within the posterior pelvis measures 4.7 cm, image 207 of series 4. Likely ovarian cyst. No hypermetabolic lymph nodes in the abdomen or pelvis.  SKELETON  No focal hypermetabolic activity to suggest skeletal metastasis.  IMPRESSION: 1. There is malignant range FDG uptake associated with the masslike density within the right upper lobe. Cannot rule out pulmonary malignancy such as pulmonary adenocarcinoma. Correlation with tissue sampling recommended. 2. No evidence for mediastinal or hilar lymph node metastasis and no distant metastatic disease. 3. 4.7 cm left ovarian cyst. This is almost certainly benign, but follow up  ultrasound is recommended in 1 year according to the Society of Radiologists in [place] Statement (D Clovis Riley et al. Management of Asymptomatic Ovarian and Other Adnexal Cysts Imaged at [place]: Society of Radiologists in [place] Statement 2010. Radiology 256 (Sept 2010): 259-563.).   Electronically Signed  By: Kerby Moors M.D.  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 This most likely is adenocarcinoma with lepidic spread based on the pathology and radiology findings. This would also be consistent with her being a lifelong nonsmoker.  [name] West has no known exposure to radon or other carcinogens.  Clinically [name] West has stage IB disease with the mass being larger than 3 cm. There is no mediastinal or hilar adenopathy by CT or PET CT. Surgery is the primary mode of treatment, but [name] West may also need adjuvant chemotherapy based on the final pathology findings.  Her pulmonary function is normal. [name] West should be able to tolerate a lobectomy without significant impairment.  I recommended her that we proceed with right video-assisted thoracoscopy and right upper lobectomy. I described the operation to her in detail, including the need for general anesthesia, the incisions to be used, the expected hospital stay, chest tubes postoperatively, and the overall recovery. [name] West understands that complete surgical resection gives her a good chance of a cure but does not guarantee a cure. I reviewed the indications, risks, benefits, and alternatives. [name] West understands the risks include, but are not limited to death, MI, DVT, PE, bleeding, possible need for  transfusion, infection, prolonged air leak, cardiac arrhythmias, chronic pain, as well as the possibility of other unforeseeable complications.  [name] West accepts these risks and agrees to proceed.  Plan: Right VATS, right upper lobectomy on Thursday 07/30/2015  Melrose Nakayama, MD [facility] [phone]

## 2015-07-30 NOTE — OR Nursing (Signed)
  CXR results received from Radiology.  Results communicated to Dr. Roxan Hockey and Dr. Ermalene Postin.  Order received to discontinue central line due to malposition.

## 2015-07-30 NOTE — OR Nursing (Signed)
CVC discontinued per order and per policy.  Manual pressure held on the occlusive dressing for 10 minutes until hemastasis achieved.  Pt tolerated well.

## 2015-07-30 NOTE — Interval H&P Note (Signed)
History and Physical Interval Note:  07/30/2015 8:02 AM  [name] West  has presented today for surgery, with the diagnosis of RIGHT UPPER LOBE ADENOCARCINOMA  The various methods of treatment have been discussed with the patient and family. After consideration of risks, benefits and other options for treatment, the patient has consented to  Procedure(s): VIDEO ASSISTED THORACOSCOPY (VATS)/RIGHT UPPER LOBECTOMY (Right) as a surgical intervention .  The patient's history has been reviewed, patient examined, no change in status, stable for surgery.  I have reviewed the patient's chart and labs.  Questions were answered to the patient's satisfaction.     Melrose Nakayama

## 2015-07-30 NOTE — Brief Op Note (Addendum)
07/30/2015  1:52 PM  PATIENT:  [name] West  65 y.o. female  PRE-OPERATIVE DIAGNOSIS:  RIGHT UPPER LOBE ADENOCARCINOMA- Clinical Stage IB  POST-OPERATIVE DIAGNOSIS:  RIGHT UPPER LOBE ADENOCARCINOMA- Clinical stage IIIA  PROCEDURE:  Procedure(s):  RIGHT VIDEO ASSISTED THORACOSCOPY  -Right Upper Lobectomy with Bronchoplasty -Mediastinal Lymph Node Dissection  SURGEON:  Surgeon(s) and Role:    * Melrose Nakayama, MD - Primary  PHYSICIAN ASSISTANT: Ellwood Handler PA-C  ANESTHESIA:   general  EBL:  Total I/O In: 1000 [I.V.:1000] Out: 580 [Urine:480; Blood:100]  BLOOD ADMINISTERED:none  DRAINS: 28 Straight, 28 Blake Drain Right Chest   LOCAL MEDICATIONS USED:  NONE  SPECIMEN:  Source of Specimen:  Lymph Node, Right upper Lobe  DISPOSITION OF SPECIMEN:  PATHOLOGY  COUNTS:  YES  PLAN OF CARE: Admit to inpatient   PATIENT DISPOSITION:  PACU - hemodynamically stable.   Delay start of Pharmacological VTE agent (>24hrs) due to surgical blood loss or risk of bleeding: yes  Initial bronchial margin showed lymph node with metastatic adenocarcinoma and atypical cells at the margin itself. Second bronchial showed no tumor

## 2015-07-30 NOTE — Transfer of Care (Signed)
Immediate Anesthesia Transfer of Care Note  Patient: [name] West  Procedure(s) Performed: Procedure(s): RIGHT VIDEO ASSISTED THORACOSCOPY (VATS)/RIGHT UPPER LOBECTOMY (Right)  Patient Location: PACU  Anesthesia Type:General  Level of Consciousness: awake and alert   Airway & Oxygen Therapy: Patient Spontanous Breathing and Patient connected to nasal cannula oxygen  Post-op Assessment: Report given to RN and Post -op Vital signs reviewed and stable  Post vital signs: Reviewed and stable  Last Vitals:  Filed Vitals:   07/30/15 0611  BP: 115/57  Pulse: 75  Temp: 37 C  Resp: 16    Complications: No apparent anesthesia complications

## 2015-07-31 ENCOUNTER — Inpatient Hospital Stay (HOSPITAL_COMMUNITY): Payer: BC Managed Care – PPO

## 2015-07-31 LAB — CBC
HCT: 35 % — ABNORMAL LOW (ref 36.0–46.0)
Hemoglobin: 11.5 g/dL — ABNORMAL LOW (ref 12.0–15.0)
MCH: 29.9 pg (ref 26.0–34.0)
MCHC: 32.9 g/dL (ref 30.0–36.0)
MCV: 91.1 fL (ref 78.0–100.0)
Platelets: 195 10*3/uL (ref 150–400)
RBC: 3.84 MIL/uL — ABNORMAL LOW (ref 3.87–5.11)
RDW: 13.1 % (ref 11.5–15.5)
WBC: 13.8 10*3/uL — ABNORMAL HIGH (ref 4.0–10.5)

## 2015-07-31 LAB — BASIC METABOLIC PANEL
Anion gap: 8 (ref 5–15)
BUN: 10 mg/dL (ref 6–20)
CO2: 26 mmol/L (ref 22–32)
Calcium: 8.3 mg/dL — ABNORMAL LOW (ref 8.9–10.3)
Chloride: 104 mmol/L (ref 101–111)
Creatinine, Ser: 0.71 mg/dL (ref 0.44–1.00)
GFR calc Af Amer: >60 mL/min (ref >60)
GFR calc non Af Amer: >60 mL/min (ref >60)
Glucose, Bld: 140 mg/dL — ABNORMAL HIGH (ref 65–99)
Potassium: 3.9 mmol/L (ref 3.5–5.1)
Sodium: 138 mmol/L (ref 135–145)

## 2015-07-31 LAB — BLOOD GAS, ARTERIAL
Acid-Base Excess: 0.5 mmol/L (ref 0.0–2.0)
Bicarbonate: 24.7 mEq/L — ABNORMAL HIGH (ref 20.0–24.0)
Drawn by: 42624
FIO2: 0.21
O2 Saturation: 92.8 %
Patient temperature: 98.6
TCO2: 26 mmol/L (ref 0–100)
pCO2 arterial: 41 mmHg (ref 35.0–45.0)
pH, Arterial: 7.397 (ref 7.350–7.450)
pO2, Arterial: 63.1 mmHg — ABNORMAL LOW (ref 80.0–100.0)

## 2015-07-31 MED ORDER — ENOXAPARIN SODIUM 40 MG/0.4ML ~~LOC~~ SOLN: 40.0000 mg | Freq: Every day | SUBCUTANEOUS | Status: DC

## 2015-07-31 MED ORDER — LEVALBUTEROL HCL 0.63 MG/3ML IN NEBU: 0.6300 mg | INHALATION_SOLUTION | Freq: Three times a day (TID) | RESPIRATORY_TRACT | Status: DC | PRN

## 2015-07-31 MED ORDER — ENOXAPARIN SODIUM 30 MG/0.3ML ~~LOC~~ SOLN
30.0000 mg | Freq: Every day | SUBCUTANEOUS | Status: DC
  Administered 2015-07-31 – 2015-08-03 (×4): 30 mg via SUBCUTANEOUS
  Filled 2015-07-31 (×4): qty 0.3

## 2015-07-31 MED ORDER — LUNG SURGERY BOOK
Freq: Once | Status: AC
  Administered 2015-07-31: 19:00:00
  Filled 2015-07-31: qty 1

## 2015-07-31 NOTE — Progress Notes (Signed)
      [address]       [place] [address]             [phone]      Pathology reviewed. 8/13 nodes + for metastatic adenocarcinoma  T2,N2, stage IIIA  Discussed with Mrs. Sforza and her family.  She will need adjuvant chemo and radiation  She will also need MR brain as an outpatient  Will send tissue for molecular testing  Remo Lipps C. Roxan Hockey, MD [facility] [phone]

## 2015-07-31 NOTE — Progress Notes (Signed)
1 Day Post-Op Procedure(s) (LRB): RIGHT VIDEO ASSISTED THORACOSCOPY (VATS)/RIGHT UPPER LOBECTOMY (Right) Subjective: Some incisional pain, but better than yesterday  Objective: Vital signs in last 24 hours: Temp:  [97.7 F (36.5 C)-98.5 F (36.9 C)] 98.5 F (36.9 C) (02/17 0300) Pulse Rate:  [86-103] 97 (02/17 0300) Cardiac Rhythm:  [-] Normal sinus rhythm (02/17 0700) Resp:  [13-28] 15 (02/17 0300) BP: (89-118)/(53-77) 103/61 mmHg (02/17 0300) SpO2:  [94 %-100 %] 94 % (02/17 0300) Arterial Line BP: (84-124)/(25-96) 106/96 mmHg (02/17 0300) Weight:  [104 lb 0.9 oz (47.2 kg)] 104 lb 0.9 oz (47.2 kg) (02/16 1825)  Hemodynamic parameters for last 24 hours:    Intake/Output from previous day: 02/16 0701 - 02/17 0700 In: 2762.5 [P.O.:240; I.V.:2322.5; IV Piggyback:200] Out: 9528 [Urine:1130; Blood:250; Chest Tube:189] Intake/Output this shift:    General appearance: alert, cooperative and no distress Neurologic: intact Heart: regular rate and rhythm Lungs: diminished breath sounds left base, otherwise clear Abdomen: normal findings: soft, non-tender small air leak, serous drainage  Lab Results:  Recent Labs  07/28/15 1223 07/31/15 0555  WBC 10.8* 13.8*  HGB 14.2 11.5*  HCT 42.7 35.0*  PLT 226 195   BMET:  Recent Labs  07/28/15 1223 07/31/15 0555  NA 140 138  K 4.2 3.9  CL 107 104  CO2 22 26  GLUCOSE 90 140*  BUN 16 10  CREATININE 0.77 0.71  CALCIUM 9.4 8.3*    PT/INR:  Recent Labs  07/28/15 1223  LABPROT 13.5  INR 1.01   ABG    Component Value Date/Time   PHART 7.397 07/31/2015 0500   HCO3 24.7* 07/31/2015 0500   TCO2 26.0 07/31/2015 0500   O2SAT 92.8 07/31/2015 0500   CBG (last 3)  No results for input(s): GLUCAP in the last 72 hours.  Assessment/Plan: S/P Procedure(s) (LRB): RIGHT VIDEO ASSISTED THORACOSCOPY (VATS)/RIGHT UPPER LOBECTOMY (Right) POD # 1  Doing well  CV- stable, dc a line  RESP- s/p lobectomy. Has a small air leak,  will try CT to water seal, can probably get posterior CT out tomorrow if drainage remains low  RENAL- creatinine and lytes ok  ENDO- CBG mildly elevated this AM  DVT prophylaxis- SCD + enoxaparin   LOS: 1 day    Melrose Nakayama 07/31/2015

## 2015-07-31 NOTE — Procedures (Signed)
Called because A-line was not working.  Wave form not displayed on monitor.  RT disconnected the A-line at the (arterial) port and attempted to flush and draw back.  In doing this resistance was met.  RT repositioned arm and flushed/drew back once again and was unsuccessful.  RN notified that the A-line is no longer working.

## 2015-07-31 NOTE — Telephone Encounter (Signed)
Kathlee Nations in our billing dept is checking on this for me Joellen Jersey No precert was required Joellen Jersey

## 2015-07-31 NOTE — Care Management Note (Signed)
Case Management Note  Patient Details  Name: Chancie Lampert MRN: [id number] Date of Birth: Feb 03, 1951  Subjective/Objective:      Date: 07/31/15 Spoke with patient at the bedside along with spouse.  Introduced self as Tourist information centre manager and explained role in discharge planning and how to be reached.  Verified patient lives in town, with spouse, pta indep. Expressed potential need for no  DME.  Verified patient anticipates to go home with family, at time of discharge and will have full-time supervision by at this time to best of their knowledge.  Patient denied needing help with their medication.  Patient drives to MD appointments.  Verified patient has PCP Theadore Nan.   Plan: CM will continue to follow for discharge planning and [facility] resources.               Action/Plan:   Expected Discharge Date:                  Expected Discharge Plan:  Home/Self Care  In-House Referral:     Discharge planning Services  CM Consult  Post Acute Care Choice:    Choice offered to:     DME Arranged:    DME Agency:     HH Arranged:    HH Agency:     Status of Service:  In process, will continue to follow  Medicare Important Message Given:    Date Medicare IM Given:    Medicare IM give by:    Date Additional Medicare IM Given:    Additional Medicare Important Message give by:     If discussed at [place] of Stay Meetings, dates discussed:    Additional Comments:  Zenon Mayo, RN 07/31/2015, 4:31 PM

## 2015-07-31 NOTE — Anesthesia Postprocedure Evaluation (Signed)
Anesthesia Post Note  Patient: [name] West  Procedure(s) Performed: Procedure(s) (LRB): RIGHT VIDEO ASSISTED THORACOSCOPY (VATS)/RIGHT UPPER LOBECTOMY (Right)  Patient location during evaluation: PACU Anesthesia Type: General Level of consciousness: awake Pain management: pain level controlled Vital Signs Assessment: post-procedure vital signs reviewed and stable Respiratory status: spontaneous breathing Cardiovascular status: stable Postop Assessment: no signs of nausea or vomiting Anesthetic complications: no    Last Vitals:  Filed Vitals:   07/31/15 0300 07/31/15 0800  BP: 103/61 108/62  Pulse: 97 90  Temp: 36.9 C 36.7 C  Resp: 15 16    Last Pain:  Filed Vitals:   07/31/15 0849  PainSc: 3                  CHRISTOPHER MOSER

## 2015-08-01 ENCOUNTER — Inpatient Hospital Stay (HOSPITAL_COMMUNITY): Payer: BC Managed Care – PPO

## 2015-08-01 LAB — CBC
HCT: 31.8 % — ABNORMAL LOW (ref 36.0–46.0)
Hemoglobin: 10.1 g/dL — ABNORMAL LOW (ref 12.0–15.0)
MCH: 29.6 pg (ref 26.0–34.0)
MCHC: 31.8 g/dL (ref 30.0–36.0)
MCV: 93.3 fL (ref 78.0–100.0)
Platelets: 172 10*3/uL (ref 150–400)
RBC: 3.41 MIL/uL — ABNORMAL LOW (ref 3.87–5.11)
RDW: 13.1 % (ref 11.5–15.5)
WBC: 11 10*3/uL — ABNORMAL HIGH (ref 4.0–10.5)

## 2015-08-01 LAB — COMPREHENSIVE METABOLIC PANEL
ALT: 14 U/L (ref 14–54)
AST: 18 U/L (ref 15–41)
Albumin: 2.5 g/dL — ABNORMAL LOW (ref 3.5–5.0)
Alkaline Phosphatase: 33 U/L — ABNORMAL LOW (ref 38–126)
Anion gap: 6 (ref 5–15)
BUN: 10 mg/dL (ref 6–20)
CO2: 28 mmol/L (ref 22–32)
Calcium: 8.3 mg/dL — ABNORMAL LOW (ref 8.9–10.3)
Chloride: 105 mmol/L (ref 101–111)
Creatinine, Ser: 0.68 mg/dL (ref 0.44–1.00)
GFR calc Af Amer: >60 mL/min (ref >60)
GFR calc non Af Amer: >60 mL/min (ref >60)
Glucose, Bld: 121 mg/dL — ABNORMAL HIGH (ref 65–99)
Potassium: 4.2 mmol/L (ref 3.5–5.1)
Sodium: 139 mmol/L (ref 135–145)
Total Bilirubin: 0.4 mg/dL (ref 0.3–1.2)
Total Protein: 4.7 g/dL — ABNORMAL LOW (ref 6.5–8.1)

## 2015-08-01 NOTE — Progress Notes (Addendum)
      [address]       [place] [address]             [phone]      2 Days Post-Op Procedure(s) (LRB): RIGHT VIDEO ASSISTED THORACOSCOPY (VATS)/RIGHT UPPER LOBECTOMY (Right)   Subjective:  Doing okay.  Continues to have increased pain at night.  + ambulation  Objective: Vital signs in last 24 hours: Temp:  [98.3 F (36.8 C)-98.9 F (37.2 C)] 98.5 F (36.9 C) (02/18 0816) Pulse Rate:  [88-108] 88 (02/18 0816) Cardiac Rhythm:  [-] Normal sinus rhythm (02/18 0816) Resp:  [19-25] 20 (02/18 0816) BP: (91-118)/(56-77) 110/71 mmHg (02/18 0816) SpO2:  [87 %-95 %] 93 % (02/18 0816)  Intake/Output from previous day: 02/17 0701 - 02/18 0700 In: 2681.7 [P.O.:1200; I.V.:1481.7] Out: 2685 [Urine:2375; Chest Tube:310] Intake/Output this shift: Total I/O In: 353.3 [P.O.:240; I.V.:113.3] Out: 540 [Urine:500; Chest Tube:40]  General appearance: alert, cooperative and no distress Heart: regular rate and rhythm Lungs: diminished breath sounds bibasilar Abdomen: soft, non-tender; bowel sounds normal; no masses,  no organomegaly Wound: clean and dry  Lab Results:  Recent Labs  07/31/15 0555 08/01/15 0759  WBC 13.8* 11.0*  HGB 11.5* 10.1*  HCT 35.0* 31.8*  PLT 195 172   BMET:  Recent Labs  07/31/15 0555 08/01/15 0759  NA 138 139  K 3.9 4.2  CL 104 105  CO2 26 28  GLUCOSE 140* 121*  BUN 10 10  CREATININE 0.71 0.68  CALCIUM 8.3* 8.3*    PT/INR: No results for input(s): LABPROT, INR in the last 72 hours. ABG    Component Value Date/Time   PHART 7.397 07/31/2015 0500   HCO3 24.7* 07/31/2015 0500   TCO2 26.0 07/31/2015 0500   O2SAT 92.8 07/31/2015 0500   CBG (last 3)  No results for input(s): GLUCAP in the last 72 hours.  Assessment/Plan: S/P Procedure(s) (LRB): RIGHT VIDEO ASSISTED THORACOSCOPY (VATS)/RIGHT UPPER LOBECTOMY (Right)  1. Chest tube- 80 cc out since midnight, small air leak with cough on water seal- will d/c posterior chest  tube today 2. CV- remains hemodynamically stable 3. Renal- creatinine, lytes remain okay, some mild hypervolemia 4. Decrease IV Fluids to KVO 5. Dispo- patient stable, d/c posterior chest tube today, leave remaining chest to water seal, repeat CXR in AM   LOS: 2 days    BARRETT, ERIN 08/01/2015  patient examined and medical record reviewed,agree with above note. Tharon Aquas Trigt III 08/02/2015

## 2015-08-02 ENCOUNTER — Inpatient Hospital Stay (HOSPITAL_COMMUNITY): Payer: BC Managed Care – PPO

## 2015-08-02 MED ORDER — FENTANYL 40 MCG/ML IV SOLN
INTRAVENOUS | Status: DC
  Administered 2015-08-02: 60 ug via INTRAVENOUS
  Administered 2015-08-02 (×2): 30 ug via INTRAVENOUS
  Administered 2015-08-03: 10 ug via INTRAVENOUS
  Administered 2015-08-03: 50 ug via INTRAVENOUS

## 2015-08-02 MED ORDER — NALOXONE HCL 0.4 MG/ML IJ SOLN: 0.4000 mg | INTRAMUSCULAR | Status: DC | PRN

## 2015-08-02 MED ORDER — GUAIFENESIN ER 600 MG PO TB12
1200.0000 mg | ORAL_TABLET | Freq: Two times a day (BID) | ORAL | Status: DC
  Administered 2015-08-02 – 2015-08-03 (×4): 1200 mg via ORAL
  Filled 2015-08-02 (×4): qty 2

## 2015-08-02 MED ORDER — SODIUM CHLORIDE 0.9% FLUSH: 9.0000 mL | INTRAVENOUS | Status: DC | PRN

## 2015-08-02 MED ORDER — DIPHENHYDRAMINE HCL 12.5 MG/5ML PO ELIX: 12.5000 mg | ORAL_SOLUTION | Freq: Four times a day (QID) | ORAL | Status: DC | PRN

## 2015-08-02 MED ORDER — ONDANSETRON HCL 4 MG/2ML IJ SOLN: 4.0000 mg | Freq: Four times a day (QID) | INTRAMUSCULAR | Status: DC | PRN

## 2015-08-02 MED ORDER — DIPHENHYDRAMINE HCL 50 MG/ML IJ SOLN: 12.5000 mg | Freq: Four times a day (QID) | INTRAMUSCULAR | Status: DC | PRN

## 2015-08-02 MED ORDER — KETOROLAC TROMETHAMINE 15 MG/ML IJ SOLN
15.0000 mg | Freq: Four times a day (QID) | INTRAMUSCULAR | Status: DC | PRN
  Administered 2015-08-02: 15 mg via INTRAVENOUS
  Filled 2015-08-02 (×2): qty 1

## 2015-08-02 MED ORDER — MENTHOL 3 MG MT LOZG
1.0000 | LOZENGE | OROMUCOSAL | Status: DC | PRN
  Administered 2015-08-02: 3 mg via ORAL
  Filled 2015-08-02: qty 9

## 2015-08-02 NOTE — Discharge Instructions (Signed)
Lung Resection, Care After Refer to this sheet in the next few weeks. These instructions provide you with information on caring for yourself after your procedure. Your health care provider may also give you more specific instructions. Your treatment has been planned according to current medical practices, but problems sometimes occur. Call your health care provider if you have any problems or questions after your procedure. WHAT TO EXPECT AFTER THE PROCEDURE After your procedure, it is typical to have the following:   You may feel pain in your chest and throat.  Patients may sometimes shiver or feel nauseous during recovery. HOME CARE INSTRUCTIONS  You may resume a normal diet and activities as directed by your health care provider.  Do not use any tobacco products, including cigarettes, chewing tobacco, or electronic cigarettes. If you need help quitting, ask your health care provider.  There are many different ways to close and cover an incision, including stitches, skin glue, and adhesive strips. Follow your health care provider's instructions on:  Incision care.  Bandage (dressing) changes and removal.  Incision closure removal.  Take medicines only as directed by your health care provider.  Keep all follow-up visits as directed by your health care provider. This is important.  Try to breathe deeply and cough as directed. Holding a pillow firmly over your ribs may help with discomfort.  If you were given an incentive spirometer in the hospital, continue to use it as directed by your health care provider.  Walk as directed by your health care provider.  You may take a shower and gently wash the area of your incision with water and soap as directed by your health care provider. Do not use anything else to clean your incision except as directed by your health care provider. Do not take baths, swim, or use a hot tub until your health care provider approves. SEEK MEDICAL CARE  IF:  You notice redness, swelling, or increasing pain at the incision site.  You are bleeding at the incision site.  You see pus coming from the incision site.  You notice a bad smell coming from the incision site or bandage.  Your incision breaks open.  You cough up blood or pus, or you develop a cough that produces bad-smelling sputum.  You have pain or swelling in your legs.  You have increasing pain that is not controlled with medicine.  You have trouble managing any of the tubes that have been left in place after surgery.  You have fever or chills. SEEK IMMEDIATE MEDICAL CARE IF:   You have chest pain or an irregular or rapid heartbeat.  You have dizzy episodes or faint.  You have shortness of breath or difficulty breathing.  You have persistent nausea or vomiting.  You have a rash.   This information is not intended to replace advice given to you by your health care provider. Make sure you discuss any questions you have with your health care provider.   Document Released: 12/17/2004 Document Revised: 06/20/2014 Document Reviewed: 07/19/2013 Elsevier Interactive Patient Education [employer].

## 2015-08-02 NOTE — Discharge Summary (Signed)
Physician Discharge Summary  Patient ID: [name] West MRN: [id number] DOB/AGE: [birth year] 65 y.o.  Admit date: 07/30/2015 Discharge date: 08/04/2015  Admission Diagnoses: Adenocarcinoma- clinical stage IB Patient Active Problem List   Diagnosis Date Noted  . Non-small cell lung cancer ([place]) 06/24/2015   Discharge Diagnoses:  Adenocarcinoma- Clinical stage IIIA Patient Active Problem List   Diagnosis Date Noted  . S/P lobectomy of lung 07/30/2015  . Non-small cell lung cancer ([place]) 06/24/2015   Discharged Condition: good  History of Present Illness:  [name] West is a 65 yo lady who is a life long non-smoker.  In January she had an episode of left sided chest pain.  Workup showed a normal EKG and negative troponins.  CXR was also obtained which showed a shadow in the right upper lobe.  Further workup with CT scan was completed and showed a 4.5 cm area of consolidation in the right upper lobe.  She was referred to Dr. Lamonte Sakai who ordered a PET CT scan which showed the right upper lobe mass to be hypermetabolic.  She underwent EBUS on 07/15/2015 which confirmed the presence of non-small cell carcinoma.  Due to this she was subsequently referred to [facility] for consultation for surgical resection.  She was evaluated by Dr. Roxan Hockey on 07/21/2015 at which time the patient denied symptoms of cough, shortness of breath, hemoptysis, or weight loss.  She was physically active and had not shown any decrease in her exercise tolerance.  After review of her scan it was felt she should undergo Right Upper Lobectomy.  The risks and benefits of the procedure were explained to the patient and she was agreeable to proceed.  Hospital Course:   [name] West presented to [facility] on 07/30/2015.  She was taken to the operating room and underwent Right VATS with Right Upper Lobectomy and Lymph Node Dissection.  She tolerated the procedure without difficulty, was extubated and taken to the PACU in stable condition.   The patient progressed uneventfully after surgery.  On POD #1 her chest tubes were placed to water seal.  CXR showed a small apical space and expected post operative changes.  Her chest tube output remained low and her posterior chest tube was removed on POD #2.  Again the patient's follow up CXR showed stable appearance of an apical space.  Her remaining chest tube was free from air leak and was removed on POD #3.  Follow up CXR showed stable appearance of apical space.  She had increased pain at night during her stay.  She was treated with Toradol which provided relief.  She was able to ambulate independently.  She was weaned off oxygen without difficulty.  Her pain was controlled with oral medications once her PCA was discontinued.  She is tolerating a diet.  Final pathology is pasted below.  She is felt medically stable for discharge home today.   Significant Diagnostic Studies: nuclear medicine: PET  . There is malignant range FDG uptake associated with the masslike density within the right upper lobe. Cannot rule out pulmonary malignancy such as pulmonary adenocarcinoma. Correlation with tissue sampling recommended. 2. No evidence for mediastinal or hilar lymph node metastasis and no distant metastatic disease. 3. 4.7 cm left ovarian cyst. This is almost certainly benign, but follow up ultrasound is recommended in 1 year according to the Society of Radiologists in [place] Statement [name] West et al. Management of Asymptomatic Ovarian and Other Adnexal Cysts Imaged at [place]: Society of Radiologists in Ultrasound Consensus  Conference Statement 2010. Radiology 256 (Sept 2010): 563-875.).  PATHOLOGY:  Diagnosis 1. Lung, resection (segmental or lobe), right upper lobe WELL DIFFERENTIATED ADENOCARCINOMA OF THE LUNG (4.0 CM) THE TUMOR INVADES VISCERAL PLEURAL LYMPHOVASCULAR INVASION IS IDENTIFIED VASCULAR, BRONCHUS AND PARENCHYMA MARGINS OF RESECTION ARE NEGATIVE FOR  TUMOR METASTATIC ADENOCARCINOMA IN TWO OF TWO LYMPH NODES (2/2) 2. Lymph node, biopsy, 7 METASTATIC ADENOCARCINOMA IN ONE OF ONE LYMPH NODE (1/1) 3. Lymph node, biopsy, 10R METASTATIC ADENOCARCINOMA IN ONE OF ONE LYMPH NODE (1/1) 4. Lymph node, biopsy, 12R ONE BENIGN LYMPH NODE (0/1) 5. Lymph node, biopsy, 10R #2 ONE BENIGN LYMPH NODE (0/1) 6. Lymph node, biopsy, 12R #2 METASTATIC ADENOCARCINOMA IN ONE OF ONE LYMPH NODE (1/1) 7. Lymph node, biopsy, 12R #3 ONE BENIGN LYMPH NODE (0/1) 8. Lymph node, biopsy, 12R #4 METASTATIC ADENOCARCINOMA IN ONE OF ONE LYMPH NODE (1/1) 9. Bronchus, biopsy, right upper lobe BRONCHUS MARGIN, NEGATIVE FOR ADENOCARCINOMA 10. Lymph node, biopsy, 7 #2 ONE BENIGN LYMPH NODE (0/1) 11. Lymph node, biopsy, 7 #3 METASTATIC ADENOCARCINOMA IN ONE OF ONE LYMPH NODE (1/1) 12. Lymph node, biopsy, 4R METASTATIC ADENOCARCINOMA IN ONE OF ONE LYMPH NODE (1/1) 13. Lymph node, biopsy, 12R #5 ONE BENIGN LYMPH NODE (0/1)  Treatments: surgery:   Right video-assisted thoracoscopy, thoracoscopic right upper lobectomy, with bronchoplastic closure and en bloc resection of a wedge from the superior segment of the right lower lobe, mediastinal lymph node dissection.  Disposition: 01-Home or Self Care   Discharge medications:     Medication List    TAKE these medications        acetaminophen 500 MG tablet  Commonly known as:  TYLENOL  Take 2 tablets (1,000 mg total) by mouth every 6 (six) hours as needed.     cholecalciferol 1000 units tablet  Commonly known as:  VITAMIN D  Take 1,000 Units by mouth daily.     citalopram 10 MG tablet  Commonly known as:  CELEXA  Take 10 mg by mouth every morning.     fexofenadine 180 MG tablet  Commonly known as:  ALLEGRA  Take 180 mg by mouth daily.     FINACEA 15 % cream  Generic drug:  Azelaic Acid  Apply 1 application topically 2 (two) times daily. After skin is thoroughly washed and patted dry, gently but thoroughly  massage a thin film of azelaic acid cream into the affected area twice daily, in the morning and evening.     MIRVASO 0.33 % Gel  Generic drug:  Brimonidine Tartrate  Apply 1 application topically daily.     multivitamin with minerals Tabs tablet  Take 1 tablet by mouth daily.     polyethylene glycol powder powder  Commonly known as:  MIRALAX  Take 255 g by mouth once.     TART CHERRY ADVANCED PO  Take 1 tablet by mouth daily.     traMADol 50 MG tablet  Commonly known as:  ULTRAM  Take 1-2 tablets (50-100 mg total) by mouth every 6 (six) hours as needed (mild pain).     zolpidem 5 MG tablet  Commonly known as:  AMBIEN  Take 5 mg by mouth at bedtime as needed for sleep.       Follow-up Information    Follow up with Melrose Nakayama, MD On 08/18/2015.   Specialty:  Cardiothoracic Surgery   Why:  Appointment is at 12:30   Contact information:   [address] [place] [address] [phone]  Follow up with [facility] On 08/18/2015.   Why:  please get CXR 30 min prior to your appointment with Dr. Roxan Hockey, located on 1st floor of our office building   Contact information:   [place]       Follow up with [facility] nurse On 08/10/2015.   Why:  with nurse for suture removal at 10:30      Signed: Ellwood Handler 08/04/2015, 8:23 AM

## 2015-08-02 NOTE — Progress Notes (Addendum)
      [address]       [place] [address]             [phone]      3 Days Post-Op Procedure(s) (LRB): RIGHT VIDEO ASSISTED THORACOSCOPY (VATS)/RIGHT UPPER LOBECTOMY (Right)   Subjective:  Continues to have pain that is worse at night.  She has been unable to rest fully due to this.  She is also having difficulty expectorating sputum.    Objective: Vital signs in last 24 hours: Temp:  [97.5 F (36.4 C)-99.2 F (37.3 C)] 98.3 F (36.8 C) (02/19 0759) Pulse Rate:  [86-100] 91 (02/19 0800) Cardiac Rhythm:  [-] Normal sinus rhythm (02/19 0800) Resp:  [16-22] 16 (02/19 0800) BP: (99-131)/(62-78) 131/78 mmHg (02/19 0800) SpO2:  [91 %-100 %] 97 % (02/19 0800)  Intake/Output from previous day: 02/18 0701 - 02/19 0700 In: 1272.7 [P.O.:840; I.V.:432.7] Out: 830 [Urine:700; Chest Tube:130] Intake/Output this shift: Total I/O In: 50 [I.V.:50] Out: 140 [Chest Tube:140]  General appearance: alert, cooperative and no distress Heart: regular rate and rhythm Lungs: clear to auscultation bilaterally Abdomen: soft, non-tender; bowel sounds normal; no masses,  no organomegaly Wound: clean and dry  Lab Results:  Recent Labs  07/31/15 0555 08/01/15 0759  WBC 13.8* 11.0*  HGB 11.5* 10.1*  HCT 35.0* 31.8*  PLT 195 172   BMET:  Recent Labs  07/31/15 0555 08/01/15 0759  NA 138 139  K 3.9 4.2  CL 104 105  CO2 26 28  GLUCOSE 140* 121*  BUN 10 10  CREATININE 0.71 0.68  CALCIUM 8.3* 8.3*    PT/INR: No results for input(s): LABPROT, INR in the last 72 hours. ABG    Component Value Date/Time   PHART 7.397 07/31/2015 0500   HCO3 24.7* 07/31/2015 0500   TCO2 26.0 07/31/2015 0500   O2SAT 92.8 07/31/2015 0500   CBG (last 3)  No results for input(s): GLUCAP in the last 72 hours.  Assessment/Plan: S/P Procedure(s) (LRB): RIGHT VIDEO ASSISTED THORACOSCOPY (VATS)/RIGHT UPPER LOBECTOMY (Right)  1. Chest tube- no air leak present,  Stable apical space  present- likely d/c final chest tube today 2. Pulm- wean oxygen as tolerated, Mucinex for cough, encouraged use of IS 3. Pain control- pain at night, unable to rest despite PCA, pain tablets, will add Toradol prn 4. Dispo- patient is stable, CXR with stable apical space, chest tube without air leak... .likely d/c chest tube today   LOS: 3 days    Ellwood Handler 08/02/2015  Air leak present with cough- leave to water seal another day patient examined and medical record reviewed,agree with above note. Tharon Aquas Trigt III 08/02/2015

## 2015-08-03 ENCOUNTER — Encounter (HOSPITAL_COMMUNITY): Payer: Self-pay | Admitting: Thoracic Surgery (Cardiothoracic Vascular Surgery)

## 2015-08-03 ENCOUNTER — Inpatient Hospital Stay (HOSPITAL_COMMUNITY): Payer: BC Managed Care – PPO

## 2015-08-03 MED ORDER — LACTULOSE 10 GM/15ML PO SOLN
20.0000 g | Freq: Every day | ORAL | Status: DC | PRN
  Administered 2015-08-04: 20 g via ORAL
  Filled 2015-08-03: qty 30

## 2015-08-03 NOTE — Progress Notes (Signed)
Last remaining chest tube removed per MD order. Pt tolerated procedure well and repeat chest x-ray post tube removal completed. Will continue to monitor>

## 2015-08-03 NOTE — Progress Notes (Addendum)
      [name]       [place],[name] West             [phone]      4 Days Post-Op Procedure(s) (LRB): RIGHT VIDEO ASSISTED THORACOSCOPY (VATS)/RIGHT UPPER LOBECTOMY (Right)   Subjective:  [name] West continues to complain of difficulty expectorating sputum, despite Mucinex.  Her pain was somewhat improved last night.  She is ambulating.  Has no yet moved her bowels  Objective: Vital signs in last 24 hours: Temp:  [97.5 F (36.4 C)-98.4 F (36.9 C)] 97.5 F (36.4 C) (02/20 0727) Pulse Rate:  [91-102] 102 (02/20 0240) Cardiac Rhythm:  [-] Normal sinus rhythm (02/20 0240) Resp:  [12-23] 21 (02/20 0240) BP: (118-139)/(73-90) 139/87 mmHg (02/20 0240) SpO2:  [90 %-100 %] 96 % (02/20 0240)  Intake/Output from previous day: 02/19 0701 - 02/20 0700 In: 380 [P.O.:120; I.V.:260] Out: 1160 [Urine:900; Chest Tube:260]  General appearance: alert, cooperative and no distress Heart: regular rate and rhythm Lungs: clear to auscultation bilaterally Abdomen: soft, non-tender; bowel sounds normal; no masses,  no organomegaly Wound: clean andd ry  Lab Results:  Recent Labs  08/01/15 0759  WBC 11.0*  HGB 10.1*  HCT 31.8*  PLT 172   BMET:  Recent Labs  08/01/15 0759  NA 139  K 4.2  CL 105  CO2 28  GLUCOSE 121*  BUN 10  CREATININE 0.68  CALCIUM 8.3*    PT/INR: No results for input(s): LABPROT, INR in the last 72 hours. ABG    Component Value Date/Time   PHART 7.397 07/31/2015 0500   HCO3 24.7* 07/31/2015 0500   TCO2 26.0 07/31/2015 0500   O2SAT 92.8 07/31/2015 0500   CBG (last 3)  No results for input(s): GLUCAP in the last 72 hours.  Assessment/Plan: S/P Procedure(s) (LRB): RIGHT VIDEO ASSISTED THORACOSCOPY (VATS)/RIGHT UPPER LOBECTOMY (Right)  1. Chest tube- no air leak present, will d/c chest tube today 2. Pulm- no acute issues, CXR remains stable with apical space, some atelectasis present 3. GI- constipation, lactulose prn 4. CV-  Hemodynamically stable, some mild tachycardia 5. Dispo- patient stable, no air leak appreciated this morning, will d/c chest tube today   LOS: 4 days    West, [name] 08/03/2015  Patient seen and examined, agree with above Dc chest tube Possible home in AM  [place] C. Roxan Hockey, MD [facility] [phone]

## 2015-08-04 ENCOUNTER — Inpatient Hospital Stay (HOSPITAL_COMMUNITY): Payer: BC Managed Care – PPO

## 2015-08-04 MED ORDER — ACETAMINOPHEN 500 MG PO TABS: 1000.0000 mg | ORAL_TABLET | Freq: Four times a day (QID) | ORAL | Status: DC | PRN

## 2015-08-04 MED ORDER — TRAMADOL HCL 50 MG PO TABS: 50.0000 mg | ORAL_TABLET | Freq: Four times a day (QID) | ORAL | Status: DC | PRN

## 2015-08-04 MED ORDER — POLYETHYLENE GLYCOL 3350 17 GM/SCOOP PO POWD: 1.0000 | Freq: Once | ORAL | Status: DC

## 2015-08-04 NOTE — Progress Notes (Signed)
Pt discharged home per MD order, all discharge instructions reviewed and all questions answered.  

## 2015-08-04 NOTE — Progress Notes (Signed)
      [address]       [place] [address]             [phone]      5 Days Post-Op Procedure(s) (LRB): RIGHT VIDEO ASSISTED THORACOSCOPY (VATS)/RIGHT UPPER LOBECTOMY (Right)   Subjective:  Ms. Varin is doing better.  She continues to cough with inability to expectorate sputum.  + Ambulation, Small BM  Objective: Vital signs in last 24 hours: Temp:  [97.6 F (36.4 C)-98.7 F (37.1 C)] 98 F (36.7 C) (02/21 0736) Pulse Rate:  [89-115] 92 (02/21 0736) Cardiac Rhythm:  [-] Normal sinus rhythm (02/21 0736) Resp:  [14-23] 21 (02/21 0736) BP: (115-144)/(73-89) 126/81 mmHg (02/21 0736) SpO2:  [94 %-98 %] 97 % (02/21 0736)  Intake/Output from previous day: 02/20 0701 - 02/21 0700 In: 280 [P.O.:120; I.V.:160] Out: -   General appearance: alert, cooperative and no distress Heart: regular rate and rhythm Lungs: clear to auscultation bilaterally Abdomen: soft, non-tender; bowel sounds normal; no masses,  no organomegaly Wound: clean and dry  Lab Results: No results for input(s): WBC, HGB, HCT, PLT in the last 72 hours. BMET: No results for input(s): NA, K, CL, CO2, GLUCOSE, BUN, CREATININE, CALCIUM in the last 72 hours.  PT/INR: No results for input(s): LABPROT, INR in the last 72 hours. ABG    Component Value Date/Time   PHART 7.397 07/31/2015 0500   HCO3 24.7* 07/31/2015 0500   TCO2 26.0 07/31/2015 0500   O2SAT 92.8 07/31/2015 0500   CBG (last 3)  No results for input(s): GLUCAP in the last 72 hours.  Assessment/Plan: S/P Procedure(s) (LRB): RIGHT VIDEO ASSISTED THORACOSCOPY (VATS)/RIGHT UPPER LOBECTOMY (Right)  1. Pulm- CXR remains stable, not on oxygen, continue IS at discharge 2. GI- constipation, small BM after lactulose, encouraged patient to try Miralax daily while using narcotics  3. CV- remains hemodynamically stable 4. Dispo- patient stable, will d/c home today   LOS: 5 days    Ahmed Prima, Junie Panning 08/04/2015

## 2015-08-06 NOTE — Telephone Encounter (Signed)
Called patient, explained that [employer] investig[employer] and will not pay it.  Let her know these charges will be written off, but if by some chance she gets a bill for them to give me a call.

## 2015-08-06 NOTE — Telephone Encounter (Signed)
[name] West has this been taken care of?  Please advise.

## 2015-08-10 ENCOUNTER — Encounter (HOSPITAL_BASED_OUTPATIENT_CLINIC_OR_DEPARTMENT_OTHER): Admission: RE | Payer: Self-pay | Source: Ambulatory Visit

## 2015-08-10 ENCOUNTER — Ambulatory Visit (HOSPITAL_BASED_OUTPATIENT_CLINIC_OR_DEPARTMENT_OTHER)
Admission: RE | Admit: 2015-08-10 | Payer: BC Managed Care – PPO | Source: Ambulatory Visit | Admitting: Obstetrics and Gynecology

## 2015-08-10 ENCOUNTER — Ambulatory Visit (INDEPENDENT_AMBULATORY_CARE_PROVIDER_SITE_OTHER): Payer: Self-pay | Admitting: *Deleted

## 2015-08-10 DIAGNOSIS — Z902 Acquired absence of lung [part of]: Secondary | ICD-10-CM

## 2015-08-10 DIAGNOSIS — Z4802 Encounter for removal of sutures: Secondary | ICD-10-CM

## 2015-08-10 DIAGNOSIS — C3411 Malignant neoplasm of upper lobe, right bronchus or lung: Secondary | ICD-10-CM

## 2015-08-10 SURGERY — LAPAROTOMY
Anesthesia: Spinal | Laterality: Left

## 2015-08-11 LAB — FUNGUS CULTURE W SMEAR: Fungal Smear: NONE SEEN

## 2015-08-13 ENCOUNTER — Ambulatory Visit (HOSPITAL_COMMUNITY)
Admission: RE | Admit: 2015-08-13 | Discharge: 2015-08-13 | Disposition: A | Discharge status: Home / Self Care | Payer: BC Managed Care – PPO | Source: Ambulatory Visit | Attending: Thoracic Surgery (Cardiothoracic Vascular Surgery) | Admitting: Thoracic Surgery (Cardiothoracic Vascular Surgery)

## 2015-08-13 ENCOUNTER — Ambulatory Visit (HOSPITAL_COMMUNITY): Payer: BC Managed Care – PPO

## 2015-08-13 ENCOUNTER — Other Ambulatory Visit: Payer: Self-pay | Admitting: *Deleted

## 2015-08-13 DIAGNOSIS — C3411 Malignant neoplasm of upper lobe, right bronchus or lung: Secondary | ICD-10-CM | POA: Insufficient documentation

## 2015-08-13 DIAGNOSIS — G319 Degenerative disease of nervous system, unspecified: Secondary | ICD-10-CM | POA: Insufficient documentation

## 2015-08-13 MED ORDER — GADOBENATE DIMEGLUMINE 529 MG/ML IV SOLN
10.0000 mL | Freq: Once | INTRAVENOUS | Status: AC | PRN
  Administered 2015-08-13: 9 mL via INTRAVENOUS

## 2015-08-13 NOTE — Progress Notes (Signed)
[name] West returns s/p RULobectomy. Her small thoracotomy incision and chest tube site are very well healed.  One suture was easily removed. She will return next week with a chest xray.  Appetite and bowels are good. Minimal pain med usage.

## 2015-08-14 ENCOUNTER — Encounter (HOSPITAL_COMMUNITY): Payer: Self-pay | Admitting: Emergency Medicine

## 2015-08-14 ENCOUNTER — Emergency Department (HOSPITAL_COMMUNITY): Payer: BC Managed Care – PPO

## 2015-08-14 ENCOUNTER — Emergency Department (HOSPITAL_COMMUNITY)
Admission: EM | Admit: 2015-08-14 | Discharge: 2015-08-14 | Disposition: A | Discharge status: Home / Self Care | Payer: BC Managed Care – PPO | Attending: Emergency Medicine | Admitting: Emergency Medicine

## 2015-08-14 DIAGNOSIS — R079 Chest pain, unspecified: Secondary | ICD-10-CM | POA: Insufficient documentation

## 2015-08-14 LAB — CBC
HCT: 36.8 % (ref 36.0–46.0)
Hemoglobin: 11.9 g/dL — ABNORMAL LOW (ref 12.0–15.0)
MCH: 29.3 pg (ref 26.0–34.0)
MCHC: 32.3 g/dL (ref 30.0–36.0)
MCV: 90.6 fL (ref 78.0–100.0)
Platelets: 438 10*3/uL — ABNORMAL HIGH (ref 150–400)
RBC: 4.06 MIL/uL (ref 3.87–5.11)
RDW: 13 % (ref 11.5–15.5)
WBC: 16.8 10*3/uL — ABNORMAL HIGH (ref 4.0–10.5)

## 2015-08-14 LAB — BASIC METABOLIC PANEL
Anion gap: 14 (ref 5–15)
BUN: 34 mg/dL — ABNORMAL HIGH (ref 6–20)
CO2: 26 mmol/L (ref 22–32)
Calcium: 9.5 mg/dL (ref 8.9–10.3)
Chloride: 97 mmol/L — ABNORMAL LOW (ref 101–111)
Creatinine, Ser: 0.75 mg/dL (ref 0.44–1.00)
GFR calc Af Amer: >60 mL/min (ref >60)
GFR calc non Af Amer: >60 mL/min (ref >60)
Glucose, Bld: 126 mg/dL — ABNORMAL HIGH (ref 65–99)
Potassium: 4 mmol/L (ref 3.5–5.1)
Sodium: 137 mmol/L (ref 135–145)

## 2015-08-14 LAB — I-STAT TROPONIN, ED: Troponin i, poc: 0 ng/mL (ref 0.00–0.08)

## 2015-08-14 NOTE — ED Notes (Signed)
Updated pt on wait.  Pt wanting to leave.  Extensive conversation with patient to encourage her to stay.

## 2015-08-14 NOTE — ED Notes (Addendum)
C/o tightness in center of chest and throat since 8:30pm.  Denies nausea, vomiting, or any other associated symptoms.  Pt believes symptoms may be related to Macrobid that she took at 4:30pm.

## 2015-08-14 NOTE — ED Notes (Addendum)
Patient came up to tech in triage stating that she felt better and did not wish to wait any longer. Advised patient to stay due to complaint of chest pains, however patient states she would return if anything worsened.

## 2015-08-14 NOTE — ED Notes (Signed)
Pt wanting to leave.  Pt is in small waiting room with only her 2 family members due to recent surgery.  PT states she is not suppose to get upset and that the wait is making her anxious.  Advised of triage process and informed pt that once she gets to a room the MD will have her results.  Encouraged her to stay.

## 2015-08-17 ENCOUNTER — Other Ambulatory Visit: Payer: Self-pay | Admitting: Thoracic Surgery (Cardiothoracic Vascular Surgery)

## 2015-08-17 DIAGNOSIS — C3490 Malignant neoplasm of unspecified part of unspecified bronchus or lung: Secondary | ICD-10-CM

## 2015-08-18 ENCOUNTER — Ambulatory Visit (INDEPENDENT_AMBULATORY_CARE_PROVIDER_SITE_OTHER): Payer: Self-pay | Admitting: Thoracic Surgery (Cardiothoracic Vascular Surgery)

## 2015-08-18 ENCOUNTER — Encounter: Payer: Self-pay | Admitting: Thoracic Surgery (Cardiothoracic Vascular Surgery)

## 2015-08-18 VITALS — BP 133/64 | HR 95 | Resp 16 | Ht 59.0 in | Wt 89.0 lb

## 2015-08-18 DIAGNOSIS — C3411 Malignant neoplasm of upper lobe, right bronchus or lung: Secondary | ICD-10-CM

## 2015-08-18 DIAGNOSIS — Z902 Acquired absence of lung [part of]: Secondary | ICD-10-CM

## 2015-08-18 NOTE — Progress Notes (Signed)
[address]       [place] [address]             [phone]       HPI: Mrs. Weiskopf returns for a scheduled postoperative follow-up visit.  She is a 65 year old woman who originally presented with left-sided chest pain. As part of her workup she had a chest x-ray which showed a right upper lobe mass. 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 I did a thoracoscopic right upper lobectomy and node dissection on 07/30/2015. Surprisingly she had multiple positive lymph nodes (8 out of 13). Her final stage was IIIA (T2a, N2).  She did well postoperatively and was discharged on postoperative day #5.  In the interim since discharge she has been diagnosed with a urinary tract infection. She was started on Macrobid, but had an adverse reaction and ended up in the emergency room last Friday night with chest pain. She ruled out for myocardial infarction. She currently is on trimethoprim. She has not had any more issues since her antibiotic was changed. She also has been complaining of pain in her mouth. She saw her dentist yesterday and was told she had an ulcer likely from intubation.  She denies any incisional pain and is not taking any narcotics. She is taking ibuprofen for her oral pain. She denies shortness of breath or swelling in her legs.  Past Medical History  Diagnosis Date  . Arthritis     shoulders  . Ovarian cyst   . Family history of adverse reaction to anesthesia     Patients grandmother died while having hand surgery; pt unsure of cause  . Environmental and seasonal allergies   . Complication of anesthesia     nausea  . PONV (postoperative nausea and vomiting)   . Anxiety       Current Outpatient Prescriptions  Medication Sig Dispense Refill  . acetaminophen (TYLENOL) 500 MG tablet Take 2 tablets (1,000 mg total) by mouth every 6 (six) hours as needed. 30 tablet 0  . Azelaic Acid (FINACEA) 15 % cream Apply 1 application topically  2 (two) times daily. After skin is thoroughly washed and patted dry, gently but thoroughly massage a thin film of azelaic acid cream into the affected area twice daily, in the morning and evening.    . Brimonidine Tartrate (MIRVASO) 0.33 % GEL Apply 1 application topically daily.    . cholecalciferol (VITAMIN D) 1000 UNITS tablet Take 1,000 Units by mouth daily.    . citalopram (CELEXA) 10 MG tablet Take 10 mg by mouth every morning.    . fexofenadine (ALLEGRA) 180 MG tablet Take 180 mg by mouth daily.    . Misc Natural Products (TART CHERRY ADVANCED PO) Take 1 tablet by mouth daily.     . Multiple Vitamin (MULTIVITAMIN WITH MINERALS) TABS Take 1 tablet by mouth daily.    . polyethylene glycol powder (MIRALAX) powder Take 255 g by mouth once. 255 g 0  . traMADol (ULTRAM) 50 MG tablet Take 1-2 tablets (50-100 mg total) by mouth every 6 (six) hours as needed (mild pain). 40 tablet 0  . zolpidem (AMBIEN) 5 MG tablet Take 5 mg by mouth at bedtime as needed for sleep.     No current facility-administered medications for this visit.    Physical Exam BP 133/64 mmHg  Pulse 95  Resp 16  Ht '4\' 11"'$  (1.499 m)  Wt 89 lb (40.37 kg)  BMI 17.97 kg/m2  SpO79 67% 65 year old woman in no acute distress Alert and oriented 3 with no focal deficits Cardiac regular rate and rhythm normal S1 and S2 Diminished breath sounds right base, otherwise clear Incisions well healed  Diagnostic Tests: CHEST 2 VIEW  COMPARISON: Radiograph dated 08/04/2015  FINDINGS: Two views of the chest demonstrate annual increased density at the right lung base which appears improved compared to the prior study. Surgical suture material noted in the right upper lobe as well as surgical clips over the upper mediastinum. The left lung is clear. There is no pleural effusion or pneumothorax. The cardiac silhouette is within normal limits. No acute osseous pathology.  IMPRESSION: Postsurgical changes of partial right upper  lobe resection. Residual density in the right lung base appears improved compared to prior study. Follow-up recommended.  No pneumothorax.   Electronically Signed  By: Anner Crete M.D.  On: 08/14/2015 22:17 MRI HEAD WITHOUT AND WITH CONTRAST  TECHNIQUE: Multiplanar, multiecho pulse sequences of the brain and surrounding structures were obtained without and with intravenous contrast.  CONTRAST: 82m MULTIHANCE GADOBENATE DIMEGLUMINE 529 MG/ML IV SOLN  COMPARISON: PET scan 06/25/2015. MR brain 07/25/2009  FINDINGS: No evidence for acute infarction, hemorrhage, mass lesion, hydrocephalus, or extra-axial fluid.  Mild generalized atrophy, with focal atrophic change in the LEFT posterior frontal parasagittal cortex. This does not have the appearance of an old infarct, and can be perhaps seen to be developing in retrospect on previous MR from 07/25/2009 (which was otherwise normal). No subcortical or cortical gliosis in this region, no evidence for prior infarction, and no associated white matter disease.  Normal midline structures. Flow voids are maintained.  Post infusion, no abnormal enhancement of the brain or meninges.  Extracranial soft tissues unremarkable.  IMPRESSION: Slight asymmetric LEFT posterior frontal atrophic change without signs of acute or chronic cerebral infarction.  No abnormal enhancement to suggest metastatic disease to the brain or meninges. No osseous lesions are identified.   Electronically Signed  By: JStaci RighterM.D.  Impression: Mrs. 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   She is doing amazingly well postoperatively. She is not taking any pain medication for her incisions. She does have a mouth ulcer which is bothering her. She also developed a UTI likely  secondary to the Foley catheter.  She has stage IIIA disease and will need adjuvant chemotherapy and radiation. I am going to arrange for her to go to our multidisciplinary thoracic oncology clinic next week. Her tumor was sent for molecular testing. Hopefully those results will be back next week when she sees Dr. MJulien Nordmann [facility]  She may begin driving. Appropriate precautions were discussed. Her other activities are unrestricted. She may return to work on a part-time basis on Monday, March 20.  [place]  I will see her back in 2 months with PA and lateral chest x-ray to check on her progress.  SMelrose Nakayama MD [facility] ([phone]

## 2015-08-19 ENCOUNTER — Telehealth: Payer: Self-pay | Admitting: *Deleted

## 2015-08-19 DIAGNOSIS — C3491 Malignant neoplasm of unspecified part of right bronchus or lung: Secondary | ICD-10-CM

## 2015-08-19 NOTE — Telephone Encounter (Signed)
Oncology Nurse Navigator Documentation  Oncology Nurse Navigator Flowsheets 08/19/2015  Navigator Location [facility]  Navigator Encounter Type Telephone/I received a vm message from [name] West.  She was told to call to be set up with an appt.  I looked at [name] West note.  Patient needs to be evaluated for neoadjuvant chemo.  I called and left her a vm message with an appt on 08/27/15 arrive at 12:30.  I also left my name and phone number to call if needed.   Telephone Outgoing Call  Treatment Phase Treatment  Barriers/Navigation Needs Coordination of Care  Interventions Coordination of Care  Coordination of Care Appts  Acuity Level 1  Time Spent with Patient 15

## 2015-08-21 ENCOUNTER — Other Ambulatory Visit: Payer: BC Managed Care – PPO

## 2015-08-23 DIAGNOSIS — C781 Secondary malignant neoplasm of mediastinum: Secondary | ICD-10-CM | POA: Insufficient documentation

## 2015-08-23 DIAGNOSIS — C3411 Malignant neoplasm of upper lobe, right bronchus or lung: Secondary | ICD-10-CM | POA: Insufficient documentation

## 2015-08-24 ENCOUNTER — Encounter (HOSPITAL_COMMUNITY): Payer: Self-pay

## 2015-08-25 ENCOUNTER — Other Ambulatory Visit: Payer: BC Managed Care – PPO

## 2015-08-26 ENCOUNTER — Telehealth: Payer: Self-pay | Admitting: *Deleted

## 2015-08-26 NOTE — Telephone Encounter (Signed)
Called and confirmed 08/27/15 clinic appt w/ pt.

## 2015-08-27 ENCOUNTER — Telehealth: Payer: Self-pay | Admitting: *Deleted

## 2015-08-27 ENCOUNTER — Other Ambulatory Visit (HOSPITAL_BASED_OUTPATIENT_CLINIC_OR_DEPARTMENT_OTHER): Payer: BC Managed Care – PPO

## 2015-08-27 ENCOUNTER — Ambulatory Visit
Admission: RE | Admit: 2015-08-27 | Discharge: 2015-08-27 | Disposition: A | Discharge status: Home / Self Care | Payer: BC Managed Care – PPO | Source: Ambulatory Visit | Attending: Radiation Oncology | Admitting: Radiation Oncology

## 2015-08-27 ENCOUNTER — Ambulatory Visit (HOSPITAL_BASED_OUTPATIENT_CLINIC_OR_DEPARTMENT_OTHER): Payer: BC Managed Care – PPO | Admitting: Internal Medicine

## 2015-08-27 ENCOUNTER — Encounter: Payer: Self-pay | Admitting: *Deleted

## 2015-08-27 ENCOUNTER — Ambulatory Visit: Payer: BC Managed Care – PPO | Attending: Internal Medicine | Admitting: Physical Therapy

## 2015-08-27 ENCOUNTER — Telehealth: Payer: Self-pay | Admitting: Internal Medicine

## 2015-08-27 ENCOUNTER — Encounter: Payer: Self-pay | Admitting: Internal Medicine

## 2015-08-27 VITALS — BP 130/68 | HR 87 | Temp 97.5°F | Resp 18 | Ht 59.0 in | Wt 96.3 lb

## 2015-08-27 DIAGNOSIS — C3411 Malignant neoplasm of upper lobe, right bronchus or lung: Secondary | ICD-10-CM

## 2015-08-27 DIAGNOSIS — C3491 Malignant neoplasm of unspecified part of right bronchus or lung: Secondary | ICD-10-CM

## 2015-08-27 DIAGNOSIS — K1379 Other lesions of oral mucosa: Secondary | ICD-10-CM | POA: Diagnosis not present

## 2015-08-27 DIAGNOSIS — R05 Cough: Secondary | ICD-10-CM

## 2015-08-27 DIAGNOSIS — R293 Abnormal posture: Secondary | ICD-10-CM | POA: Insufficient documentation

## 2015-08-27 DIAGNOSIS — R5381 Other malaise: Secondary | ICD-10-CM | POA: Insufficient documentation

## 2015-08-27 DIAGNOSIS — C3490 Malignant neoplasm of unspecified part of unspecified bronchus or lung: Secondary | ICD-10-CM

## 2015-08-27 LAB — CBC WITH DIFFERENTIAL/PLATELET
BASO%: 0.8 % (ref 0.0–2.0)
Basophils Absolute: 0.1 10*3/uL (ref 0.0–0.1)
EOS%: 2.4 % (ref 0.0–7.0)
Eosinophils Absolute: 0.2 10*3/uL (ref 0.0–0.5)
HCT: 39.8 % (ref 34.8–46.6)
HGB: 13 g/dL (ref 11.6–15.9)
LYMPH%: 18.2 % (ref 14.0–49.7)
MCH: 29.2 pg (ref 25.1–34.0)
MCHC: 32.5 g/dL (ref 31.5–36.0)
MCV: 89.6 fL (ref 79.5–101.0)
MONO#: 0.8 10*3/uL (ref 0.1–0.9)
MONO%: 8.7 % (ref 0.0–14.0)
NEUT#: 6.8 10*3/uL — ABNORMAL HIGH (ref 1.5–6.5)
NEUT%: 69.9 % (ref 38.4–76.8)
Platelets: 289 10*3/uL (ref 145–400)
RBC: 4.45 10*6/uL (ref 3.70–5.45)
RDW: 13.4 % (ref 11.2–14.5)
WBC: 9.7 10*3/uL (ref 3.9–10.3)
lymph#: 1.8 10*3/uL (ref 0.9–3.3)

## 2015-08-27 LAB — COMPREHENSIVE METABOLIC PANEL
ALT: 26 U/L (ref 0–55)
AST: 19 U/L (ref 5–34)
Albumin: 3.8 g/dL (ref 3.5–5.0)
Alkaline Phosphatase: 83 U/L (ref 40–150)
Anion Gap: 8 mEq/L (ref 3–11)
BUN: 22.4 mg/dL (ref 7.0–26.0)
CO2: 30 mEq/L — ABNORMAL HIGH (ref 22–29)
Calcium: 9.1 mg/dL (ref 8.4–10.4)
Chloride: 101 mEq/L (ref 98–109)
Creatinine: 0.8 mg/dL (ref 0.6–1.1)
EGFR: 72 mL/min/{1.73_m2} — ABNORMAL LOW (ref >90)
Glucose: 128 mg/dl (ref 70–140)
Potassium: 4.7 mEq/L (ref 3.5–5.1)
Sodium: 138 mEq/L (ref 136–145)
Total Bilirubin: <0.3 mg/dL (ref 0.20–1.20)
Total Protein: 6.8 g/dL (ref 6.4–8.3)

## 2015-08-27 LAB — AFB CULTURE WITH SMEAR (NOT AT ARMC): Acid Fast Smear: NONE SEEN

## 2015-08-27 MED ORDER — DEXAMETHASONE 4 MG PO TABS: ORAL_TABLET | ORAL | Status: DC

## 2015-08-27 MED ORDER — PROCHLORPERAZINE MALEATE 10 MG PO TABS: 10.0000 mg | ORAL_TABLET | Freq: Four times a day (QID) | ORAL | Status: DC | PRN

## 2015-08-27 MED ORDER — FOLIC ACID 1 MG PO TABS: 1.0000 mg | ORAL_TABLET | Freq: Every day | ORAL | Status: DC

## 2015-08-27 NOTE — Telephone Encounter (Signed)
appt sheduled-gave pt copy of av

## 2015-08-27 NOTE — Telephone Encounter (Signed)
Per staff message and POF I have scheduled appts. Advised scheduler of appts. JMW  

## 2015-08-27 NOTE — Progress Notes (Signed)
[facility] Clinical Social Work  Clinical Social Work met with patient/family and Futures trader at [facility] appointment to offer support and assess for psychosocial needs.  Patient was accompanied by her spouse, Christy Sartorius.  The patient shared feeling somewhat anxious regarding chemotherapy and radiation treatment.  The patient enjoys walking, gardening, cooking, and playing with her grandson.  The patient scored a 1 on Distress Thermometer, feels she is managing her diagnosis adequately.  Clinical Social Work briefly discussed Clinical Social Work role and [employer] support programs/services.  Clinical Social Work encouraged patient to call with any additional questions or concerns.   Polo Riley, MSW, LCSW, OSW-C Clinical Social Worker [facility] [phone]

## 2015-08-27 NOTE — Progress Notes (Signed)
Radiation Oncology         [phone] ________________________________  Multidisciplinary Thoracic Oncology Clinic [facility]) Initial Outpatient Consultation  Name: [name] West MRN: [id number]  Date: 08/27/2015  DOB: September 12, 1950  QJ:JHERDEY,CXKGY, MD  Melrose Nakayama, *   REFERRING PHYSICIAN: Melrose Nakayama, *  DIAGNOSIS: The encounter diagnosis was Non-small cell lung cancer, right ([place]).  65 y.o. 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 Non-small cell lung cancer, right (HCC) 162.9 C34.91     HISTORY OF PRESENT ILLNESS::[name] West is a 65 y.o. female who presented to the ED on 06/21/15 for a 1 week history of left sided chest pain and discomfort. Chest X-ray showed a 1.9 cm opacity in the right upper lobe. CT of the chest revealed a 3.7 x 4.5 x 4.0 cm pulmonary mass in the right upper lobe with spiculations. There was also a 6 mm nodule in the right apex, which may represent an intraparenchymal lymph node. PET scan on 06/25/15 revealed a mass in the right upper lobe measuring 4.5 cm with SUV max of 2.99. No other masses or lymphadenopathy was noted, but a 4.7 cm left ovarian cyst was observed. The patient saw Dr. Lindi Adie on 06/26/15 who recommended a bronchoscopy and biopsy. CT Super D of the chest on 07/13/15 revealed collapse/consolidation of the right upper lobe, a 3.8 x 4.5 cm mass, and scattered pulmonary nodules measuring up to 5 mm. Bronchoscopy and biopsy of the right upper lobe on 07/15/15 revealed non-small cell carcinoma. The patient underwent a right upper lobectomy and lymph node biopsy on 07/30/15. This revealed a 4.0 cm well differentiated adenocarcinoma of the lung with visceral pleural and lymphovascular invasion. The vascular, bronchus, and parenchyma margins of the resection were negative. Thirteen lymph nodes were biopsied with 5 positive N1 nodes and 3 positive N2 nodes. Molecular pathology revealed an EGFR  exon 19 deletion. MRI of the brain on 08/13/15 showed no metastatic disease.  The patient was referred today for presentation in the multidisciplinary conference.  Radiology studies and pathology slides were presented there for review and discussion of treatment options.  A consensus was discussed regarding potential next steps.  PREVIOUS RADIATION THERAPY: No  PAST MEDICAL HISTORY:  has a past medical history of Arthritis; Ovarian cyst; Family history of adverse reaction to anesthesia; Environmental and seasonal allergies; Complication of anesthesia; PONV (postoperative nausea and vomiting); and Anxiety.    PAST SURGICAL HISTORY: Past Surgical History  Procedure Laterality Date  . Right rotator cuff Right   . Cesarean section  1984  . Fibroid removed    . Colonoscopy with propofol N/A 09/27/2012    Procedure: COLONOSCOPY WITH PROPOFOL;  Surgeon: Garlan Fair, MD;  Location: [facility];  Service: Endoscopy;  Laterality: N/A;  . Video bronchoscopy with endobronchial navigation N/A 07/15/2015    Procedure: VIDEO BRONCHOSCOPY WITH ENDOBRONCHIAL NAVIGATION with Biopsy;  Surgeon: Collene Gobble, MD;  Location: [place];  Service: Thoracic;  Laterality: N/A;  . Video assisted thoracoscopy (vats)/ lobectomy Right 07/30/2015    Procedure: RIGHT VIDEO ASSISTED THORACOSCOPY (VATS)/RIGHT UPPER LOBECTOMY;  Surgeon: Melrose Nakayama, MD;  Location: [place];  Service: Thoracic;  Laterality: Right;    FAMILY HISTORY: family history is not on file.  SOCIAL HISTORY:  reports that she has never smoked. She has never used smokeless tobacco. She reports that she drinks alcohol. She reports that she does not use illicit drugs.  ALLERGIES: Avelox; Cephalosporins; Clindamycin/lincomycin; Penicillins; Macrobid; Omnipaque; Adhesive; Doxycycline; Lidoderm; Pseudoephedrine; and Sulfa antibiotics  MEDICATIONS:  Current Outpatient Prescriptions  Medication Sig Dispense Refill  . acetaminophen (TYLENOL) 500 MG  tablet Take 2 tablets (1,000 mg total) by mouth every 6 (six) hours as needed. 30 tablet 0  . Azelaic Acid (FINACEA) 15 % cream Apply 1 application topically 2 (two) times daily. After skin is thoroughly washed and patted dry, gently but thoroughly massage a thin film of azelaic acid cream into the affected area twice daily, in the morning and evening.    . Brimonidine Tartrate (MIRVASO) 0.33 % GEL Apply 1 application topically daily.    . chlorhexidine (PERIDEX) 0.12 % solution Rinse with 1/2 ounce by mouth for 30 seconds then spit out. use t...  (REFER TO PRESCRIPTION NOTES).  0  . cholecalciferol (VITAMIN D) 1000 UNITS tablet Take 1,000 Units by mouth daily.    . citalopram (CELEXA) 10 MG tablet Take 10 mg by mouth every morning.    Marland Kitchen dexamethasone (DECADRON) 4 MG tablet 4 mg by mouth twice a day the day before, day of and day after the chemotherapy every 3 weeks 40 tablet 1  . fexofenadine (ALLEGRA) 180 MG tablet Take 180 mg by mouth daily.    . folic acid (FOLVITE) 1 MG tablet Take 1 tablet (1 mg total) by mouth daily. 30 tablet 4  . LORazepam (ATIVAN) 0.5 MG tablet Take 0.5 mg by mouth as needed. Prior to [facility]    . Multiple Vitamin (MULTIVITAMIN WITH MINERALS) TABS Take 1 tablet by mouth daily.    . polyethylene glycol powder (MIRALAX) powder Take 255 g by mouth once. (Patient not taking: Reported on 08/27/2015) 255 g 0  . prochlorperazine (COMPAZINE) 10 MG tablet Take 1 tablet (10 mg total) by mouth every 6 (six) hours as needed for nausea or vomiting. 30 tablet 0  . zolpidem (AMBIEN) 5 MG tablet Take 5 mg by mouth at bedtime as needed for sleep. Reported on 08/27/2015     No current facility-administered medications for this encounter.    REVIEW OF SYSTEMS:  A 15 point review of systems is documented in the electronic medical record. This was obtained by the nursing staff. However, I reviewed this with the patient to discuss relevant findings and make appropriate changes.  Pertinent items are  noted in HPI.   The patient reports she is nervous and has night sweats. She has no other complaints at this time.   PHYSICAL EXAM:  vitals were not taken for this visit.  Vitals with BMI 08/27/2015  Height _0   Weight 96 lbs 5 oz  BMI 35.4  Systolic 656  Diastolic 68  Pulse 87  Respirations 18  Per Med-Onc CLE:XNTZG, healthy, no distress, well nourished and well developed SKIN: skin color, texture, turgor are normal, no rashes or significant lesions HEAD: Normocephalic, No masses, lesions, tenderness or abnormalities EYES: normal, PERRLA, Conjunctiva are pink and non-injected EARS: External ears normal, Canals clear OROPHARYNX:no exudate, no erythema and lips, buccal mucosa, and tongue normal  NECK: supple, no adenopathy, no JVD LYMPH: no palpable lymphadenopathy, no hepatosplenomegaly BREAST:not examined LUNGS: clear to auscultation , and palpation HEART: regular rate & rhythm, no murmurs and no gallops ABDOMEN:abdomen soft, non-tender, normal bowel sounds and no masses or organomegaly BACK: Back symmetric, no curvature., No CVA tenderness EXTREMITIES:no joint deformities, effusion, or inflammation, no edema, no skin discoloration  NEURO: alert & oriented x 3 with fluent speech, no focal motor/sensory deficits  KPS = 90  100 - Normal; no complaints; no evidence of disease. 90   - Able to carry on normal activity; minor signs or symptoms of disease. 80   - Normal activity with effort; some signs or symptoms of disease. 42   - Cares for self; unable to carry on normal activity or to do active work. 60   - Requires occasional assistance, but is able to care for most of his personal needs. 50   - Requires considerable assistance and frequent medical care. 3   - Disabled; requires special care and assistance. 85   - Severely disabled; hospital admission is indicated although death not imminent. 57   - Very sick; hospital admission necessary; active supportive treatment  necessary. 10   - Moribund; fatal processes progressing rapidly. 0     - Dead  Karnofsky DA, Abelmann [place], Craver LS and [place] [facility] [phone]) The use of the nitrogen mustards in the palliative treatment of carcinoma: with particular reference to bronchogenic carcinoma Cancer 1 634-56  LABORATORY DATA:  Lab Results  Component Value Date   WBC 9.7 08/27/2015   HGB 13.0 08/27/2015   HCT 39.8 08/27/2015   MCV 89.6 08/27/2015   PLT 289 08/27/2015   Lab Results  Component Value Date   NA 138 08/27/2015   K 4.7 08/27/2015   CL 97* 08/14/2015   CO2 30* 08/27/2015   Lab Results  Component Value Date   ALT 26 08/27/2015   AST 19 08/27/2015   ALKPHOS 83 08/27/2015   BILITOT <0.30 08/27/2015   RADIOGRAPHY: Dg Chest 2 View  08/14/2015  CLINICAL DATA:  65 year old female with chest pain EXAM: CHEST  2 VIEW COMPARISON:  Radiograph dated 08/04/2015 FINDINGS: Two views of the chest demonstrate annual increased density at the right lung base which appears improved compared to the prior study. Surgical suture material noted in the right upper lobe as well as surgical clips over the upper mediastinum. The left lung is clear. There is no pleural effusion or pneumothorax. The cardiac silhouette is within normal limits. No acute osseous pathology. IMPRESSION: Postsurgical changes of partial right upper lobe resection. Residual density in the right lung base appears improved compared to prior study. Follow-up recommended. No pneumothorax. Electronically Signed   By: Anner Crete M.D.   On: 08/14/2015 22:17   Dg Chest 2 View  08/04/2015  CLINICAL DATA:  Chest tube removal yesterday with persistent chest soreness. EXAM: CHEST  2 VIEW COMPARISON:  Portable chest x-ray dated August 03, 2015 immediately following chest tube removal. FINDINGS: The small right-sided pneumothorax persists. It likely amounts to 10-15% of the lung volume. There is persistent right paratracheal soft tissue density. Surgical clips  remain in the right suprahilar region. The right hemidiaphragm is less well demonstrated today. On the left the lung is well-expanded. The interstitial markings remain increased. The heart is normal in size. The pulmonary vascularity is mildly prominent centrally but stable. The bony thorax exhibits no acute abnormality. There are degenerative changes of the shoulders. IMPRESSION: Stable appearance of the approximately 10-15% right sided pneumothorax. No large right pleural effusion. Persistently increased interstitial markings in the left lung which may reflect interstitial edema or less likely pneumonia. Electronically Signed   By: David  Martinique M.D.   On: 08/04/2015 07:47   Mr Jeri Cos AT Contrast  08/13/2015  CLINICAL DATA:  Lung cancer.  Staging. EXAM: MRI HEAD WITHOUT AND WITH CONTRAST TECHNIQUE: Multiplanar, multiecho pulse sequences of the brain and surrounding structures  were obtained without and with intravenous contrast. CONTRAST:  39m MULTIHANCE GADOBENATE DIMEGLUMINE 529 MG/ML IV SOLN COMPARISON:  PET scan 06/25/2015.  MR brain 07/25/2009 FINDINGS: No evidence for acute infarction, hemorrhage, mass lesion, hydrocephalus, or extra-axial fluid. Mild generalized atrophy, with focal atrophic change in the LEFT posterior frontal parasagittal cortex. This does not have the appearance of an old infarct, and can be perhaps seen to be developing in retrospect on previous MR from 07/25/2009 (which was otherwise normal). No subcortical or cortical gliosis in this region, no evidence for prior infarction, and no associated white matter disease. Normal midline structures.  Flow voids are maintained. Post infusion, no abnormal enhancement of the brain or meninges. Extracranial soft tissues unremarkable. IMPRESSION: Slight asymmetric LEFT posterior frontal atrophic change without signs of acute or chronic cerebral infarction. No abnormal enhancement to suggest metastatic disease to the brain or meninges. No osseous  lesions are identified. Electronically Signed   By: JStaci RighterM.D.   On: 08/13/2015 14:59   Dg Chest 1v Repeat Same Day  08/03/2015  CLINICAL DATA:  Status post right chest tube removal, persistent pain EXAM: CHEST - 1 VIEW SAME DAY COMPARISON:  08/03/2015 FINDINGS: Stable small pneumothorax is noted on the right following chest tube removal. Small amount of fluid in the apex is noted as well stable from the previous exam. Postsurgical changes in the right hilum are noted. The left lung remains clear. No acute bony abnormality is seen. IMPRESSION: Status post chest tube removal. No significant increase in right pneumothorax is noted. No new focal abnormality is seen. Electronically Signed   By: MInez CatalinaM.D.   On: 08/03/2015 13:47   Dg Chest Port 1 View  08/03/2015  CLINICAL DATA:  Status post right upper lobectomy for small cell lung malignancy EXAM: PORTABLE CHEST 1 VIEW COMPARISON:  Portable chest x-ray of August 02, 2015 FINDINGS: There is mild volume loss on the right since upper lobectomy. Soft tissue density material persists in the right paratracheal region since lobectomy. A tiny 5% or less apical pneumothorax persists. The right chest tube tip projects over the interspace between the posterior aspects of the third and fourth ribs and is stable. There is no significant pleural fluid at the lung base. There is mild shift of the mediastinum toward the left. The interstitial markings within the aerated portions of both lungs remain mildly increased. The heart is normal in size. There is mild central pulmonary vascular prominence. IMPRESSION: Stable 5% or less right apical pneumothorax. Stable mild shift of the mediastinum toward the right. Persistent mild interstitial density in both lungs. Electronically Signed   By: David  JMartiniqueM.D.   On: 08/03/2015 07:35   Dg Chest Port 1 View  08/02/2015  CLINICAL DATA:  Chest tube removal EXAM: PORTABLE CHEST 1 VIEW COMPARISON:  Chest radiograph from  one day prior. FINDINGS: There is a single remaining right apical chest tube, which has been slightly retracted in the interval. Stable cardiomediastinal silhouette with normal heart size. Small right apical pneumothorax appears stable, approximately 10%. No left pneumothorax. No pleural effusion. Surgical clips and sutures overlie the right hilum. Stable volume loss in the right hemithorax with slight right mediastinal shift. Stable reticular opacities throughout both lungs, not appreciably changed. IMPRESSION: 1. Stable small right apical pneumothorax. 2. Stable postsurgical changes in the right hemithorax and diffuse reticular opacities throughout both lungs probably representing atelectasis. Electronically Signed   By: JIlona SorrelM.D.   On: 08/02/2015 09:06   Dg  Chest Port 1 View  08/01/2015  CLINICAL DATA:  65 year old female status post right upper lobectomy EXAM: PORTABLE CHEST 1 VIEW COMPARISON:  Prior chest x-ray 07/31/2015 FINDINGS: Stable position of right-sided chest tubes. Surgical changes of right upper lobectomy. Persistent right apical hydro pneumothorax without significant interval change. Slightly increased patchy airspace opacity in the lung bases in the setting of decreased inspiratory volumes favored to reflect atelectasis. Cardiac and mediastinal contours are unchanged. No acute osseous abnormality. IMPRESSION: 1. Slightly lower inspiratory volumes with increasing left greater than right patchy basilar airspace opacities. Atelectasis is favored although developing infiltrate in the left base is difficult to exclude radiographically. 2. Stable right apical hydropneumothorax with 2 chest tubes in place. Electronically Signed   By: Jacqulynn Cadet M.D.   On: 08/01/2015 07:52   Dg Chest Port 1 View  07/31/2015  CLINICAL DATA:  Post lobectomy EXAM: PORTABLE CHEST 1 VIEW COMPARISON:  Portable exam 0646 hours compared to 07/30/2015 FINDINGS: Pair of RIGHT thoracostomy tubes. Small RIGHT apex  pneumothorax decreased since previous exam. Stable heart size, mediastinal contours and pulmonary vascularity. Question small amount of loculated pleural fluid at upper RIGHT chest as well. Atelectasis versus infiltrate LEFT lower lobe. Remaining LEFT lung clear. LEFT glenohumeral degenerative changes. IMPRESSION: Decrease in size of RIGHT apex pneumothorax. Question small fluid component/ hydrothorax at upper RIGHT chest as well. Atelectasis versus infiltrate LEFT lower lobe. Electronically Signed   By: Lavonia Dana M.D.   On: 07/31/2015 08:17   Dg Chest Port 1 View  07/30/2015  CLINICAL DATA:  Right upper lobectomy. EXAM: PORTABLE CHEST 1 VIEW COMPARISON:  July 28, 2015. FINDINGS: The heart size and mediastinal contours are within normal limits. Minimal left basilar subsegmental atelectasis is noted. No significant pleural effusion is noted. Right subclavian catheter line is noted with tip malpositioned into the left subclavian vein. Two right-sided chest tubes are noted. Postsurgical changes are noted in the right upper lobe consistent with prior lobectomy. Moderate right apical pneumothorax is noted. The visualized skeletal structures are unremarkable. IMPRESSION: Status post right upper lobectomy. Two right-sided chest tubes are noted with moderate right apical pneumothorax present. Right subclavian catheter line is noted, with tip malpositioned into left subclavian vein. These results will be called to the ordering clinician or representative by the Radiologist Assistant, and communication documented in the PACS or zVision Dashboard. Electronically Signed   By: Marijo Conception, M.D.   On: 07/30/2015 15:01      IMPRESSION: 65 y.o. 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 She had N2 nodal disease and remains at risk for mediastinal recurrence.  The patient is a good candidate for radiation after the completion of chemotherapy.  PLAN: Today, I talked to  the patient and family about the findings and work-up thus far.  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  We discussed the available radiation techniques, and focused on the details of logistics and delivery.  We reviewed the anticipated acute and late sequelae associated with radiation in this setting.  The patient was encouraged to ask questions that I answered to the best of my ability. The patient will undergo 4 cycles of chemotherapy under Dr. Julien Nordmann and then return to me for CT simulation and radiotherapy.  I spent time face to face with the patient and more than 50% of that  time was spent in counseling and/or coordination of care.   ------------------------------------------------  Sheral Apley. Tammi Klippel, M.D.  This document serves as a record of services personally performed by Tyler Pita, MD. It was created on his behalf by Darcus Austin, a trained medical scribe. The creation of this record is based on the scribe's personal observations and the provider's statements to them. This document has been checked and approved by the attending provider.

## 2015-08-27 NOTE — Progress Notes (Signed)
[place] [phone]   [phone] Multidisciplinary thoracic oncology clinic  CONSULT NOTE  REFERRING PHYSICIAN: Dr. Modesto Charon  REASON FOR CONSULTATION:  65 years old white female recently diagnosed with lung cancer.  HPI [name] West is a 65 y.o. female a never smoker with past medical significant only for arthritis, left ovarian cyst and fibroadenoma of the left breast is status post resection. In early January 2017 the patient was complaining of left-sided chest pain of one week duration. She was seen at the emergency department at [facility] and chest x-ray on 06/21/2015 showed a rounded 1.9 cm opacity inferior to the articulation of the right first rib and sternum. A nodule or mass was not excluded. CT scan of the chest with contrast was performed on the same day and it showed 3.7 x 4.5 x 4.0 cm sub-solid pulmonary mass versus areas of airspace consolidation in the right upper lobe with spiculation extending to the anterior and lateral pleural surfaces. There was also 6 mm smoothly marginated soft tissue nodule in the right apex questionable for intraparenchymal lymph node. A PET scan was performed on 06/25/2015 and it showed malignant change FDG uptake associated with the masslike density within the right upper lobe. Pulmonary malignancy such as pulmonary adenocarcinoma could not be excluded. There was no evidence of mediastinal or hilar lymph node metastasis and no distant metastatic disease. The PET scan also showed a known 4.7 cm left ovarian cyst, consistent with benign etiology. The patient was seen by Dr. Lamonte Sakai and on 07/15/2015 she underwent a video bronchoscopy with electromagnetic navigation bronchoscopy. The final pathology (Accession: [id number]) showed a scant neoplastic cells consistent with non-small cell carcinoma showing adenocarcinoma of the lung. The patient was referred to Dr. Roxan Hockey and on 07/30/2015 she underwent a  right VATS with right upper lobectomy with bronchoplasty closure and en bloc resection of a wedge from the superior segment of the right lower lobe in addition to mediastinal lymph node dissection. The final pathology (Accession: [id number]) showed the resection of the right upper lobe was consistent with well-differentiated adenocarcinoma of the lung measuring 4.0 cm. The tumor invaded the visceral pleura and there was evidence for lymphovascular invasion. The vascular, bronchial and parenchymal margins of resection were negative for tumor. The dissected lymph nodes from level 7, 10R, 12R, and 4R showed evidence for metastatic adenocarcinoma. The final pathologic staging was (pT2a, pT2). The tissue block was sent for molecular study by [facility] and the final report showed evidence for EGFR mutation with exon 19 deletion. MRI of the brain on 08/13/2015 showed no evidence for metastatic disease to the brain. Dr. Roxan Hockey kindly referred the patient to the multidisciplinary thoracic oncology clinic today for further evaluation and recommendation regarding treatment of her condition. When seen today the patient is feeling fine with no specific complaints except for pain from mucosal ulcer in the mouth. She was also recovering from urinary tract infection. She continues to have dry cough but no significant chest pain, shortness breath or hemoptysis. She denied having any significant weight loss but she has occasional night sweats. The patient denied having any nausea, vomiting, diarrhea or constipation. Family history significant for a mother still alive at age 86 and has dementia. Father died from heart failure. The patient is married and has 2 children a son and daughter. She was accompanied today by her husband Christy Sartorius. She works at [employer]. The patient has no history of smoking. She drinks wine at night time.  She has no history of drug abuse.  HPI  Past Medical History  Diagnosis Date  . Arthritis       shoulders  . Ovarian cyst   . Family history of adverse reaction to anesthesia     Patients grandmother died while having hand surgery; pt unsure of cause  . Environmental and seasonal allergies   . Complication of anesthesia     nausea  . PONV (postoperative nausea and vomiting)   . Anxiety     Past Surgical History  Procedure Laterality Date  . Right rotator cuff Right   . Cesarean section  1984  . Fibroid removed    . Colonoscopy with propofol N/A 09/27/2012    Procedure: COLONOSCOPY WITH PROPOFOL;  Surgeon: Garlan Fair, MD;  Location: [facility];  Service: Endoscopy;  Laterality: N/A;  . Video bronchoscopy with endobronchial navigation N/A 07/15/2015    Procedure: VIDEO BRONCHOSCOPY WITH ENDOBRONCHIAL NAVIGATION with Biopsy;  Surgeon: Collene Gobble, MD;  Location: [place];  Service: Thoracic;  Laterality: N/A;  . Video assisted thoracoscopy (vats)/ lobectomy Right 07/30/2015    Procedure: RIGHT VIDEO ASSISTED THORACOSCOPY (VATS)/RIGHT UPPER LOBECTOMY;  Surgeon: Melrose Nakayama, MD;  Location: [place];  Service: Thoracic;  Laterality: Right;    History reviewed. No pertinent family history.  Social History Social History  Substance Use Topics  . Smoking status: Never Smoker   . Smokeless tobacco: Never Used  . Alcohol Use: Yes     Comment: wine 1 glass per night    Allergies  Allergen Reactions  . Avelox [Moxifloxacin Hcl In Nacl] Anaphylaxis  . Cephalosporins Anaphylaxis  . Clindamycin/Lincomycin Anaphylaxis  . Penicillins Anaphylaxis    Has patient had a PCN reaction causing immediate rash, facial/tongue/throat swelling, SOB or lightheadedness with hypotension: Yes Has patient had a PCN reaction causing severe rash involving mucus membranes or skin necrosis: No Has patient had a PCN reaction that required hospitalization Yes Has patient had a PCN reaction occurring within the last 10 years: No If all of the above answers are "NO", then may proceed with  Cephalosporin use.   Marland Kitchen Macrobid [Nitrofurantoin Monohyd Macro] Other (See Comments)    CHEST TIGHTNESS...Marland KitchenHAD USED BEFORE WITH NO REACTION  . Omnipaque [Iohexol] Hives  . Adhesive [Tape] Rash  . Doxycycline Other (See Comments)    Frequently urination   . Lidoderm [Lidocaine] Rash  . Pseudoephedrine Palpitations  . Sulfa Antibiotics Rash    Current Outpatient Prescriptions  Medication Sig Dispense Refill  . acetaminophen (TYLENOL) 500 MG tablet Take 2 tablets (1,000 mg total) by mouth every 6 (six) hours as needed. 30 tablet 0  . Azelaic Acid (FINACEA) 15 % cream Apply 1 application topically 2 (two) times daily. After skin is thoroughly washed and patted dry, gently but thoroughly massage a thin film of azelaic acid cream into the affected area twice daily, in the morning and evening.    . Brimonidine Tartrate (MIRVASO) 0.33 % GEL Apply 1 application topically daily.    . cholecalciferol (VITAMIN D) 1000 UNITS tablet Take 1,000 Units by mouth daily.    . citalopram (CELEXA) 10 MG tablet Take 10 mg by mouth every morning.    . fexofenadine (ALLEGRA) 180 MG tablet Take 180 mg by mouth daily.    Marland Kitchen LORazepam (ATIVAN) 0.5 MG tablet Take 0.5 mg by mouth as needed. Prior to [facility]    . Multiple Vitamin (MULTIVITAMIN WITH MINERALS) TABS Take 1 tablet by mouth daily.    Marland Kitchen  chlorhexidine (PERIDEX) 0.12 % solution Rinse with 1/2 ounce by mouth for 30 seconds then spit out. use t...  (REFER TO PRESCRIPTION NOTES).  0  . dexamethasone (DECADRON) 4 MG tablet 4 mg by mouth twice a day the day before, day of and day after the chemotherapy every 3 weeks 40 tablet 1  . folic acid (FOLVITE) 1 MG tablet Take 1 tablet (1 mg total) by mouth daily. 30 tablet 4  . polyethylene glycol powder (MIRALAX) powder Take 255 g by mouth once. (Patient not taking: Reported on 08/27/2015) 255 g 0  . prochlorperazine (COMPAZINE) 10 MG tablet Take 1 tablet (10 mg total) by mouth every 6 (six) hours as needed for nausea or  vomiting. 30 tablet 0  . zolpidem (AMBIEN) 5 MG tablet Take 5 mg by mouth at bedtime as needed for sleep. Reported on 08/27/2015     No current facility-administered medications for this visit.    Review of Systems  Constitutional: negative Eyes: negative Ears, nose, mouth, throat, and face: negative Respiratory: positive for cough Cardiovascular: negative Gastrointestinal: negative Genitourinary:negative Integument/breast: negative Hematologic/lymphatic: negative Musculoskeletal:negative Neurological: negative Behavioral/Psych: negative Endocrine: negative Allergic/Immunologic: negative  Physical Exam  TKZ:SWFUX, healthy, no distress, well nourished and well developed SKIN: skin color, texture, turgor are normal, no rashes or significant lesions HEAD: Normocephalic, No masses, lesions, tenderness or abnormalities EYES: normal, PERRLA, Conjunctiva are pink and non-injected EARS: External ears normal, Canals clear OROPHARYNX:no exudate, no erythema and lips, buccal mucosa, and tongue normal  NECK: supple, no adenopathy, no JVD LYMPH:  no palpable lymphadenopathy, no hepatosplenomegaly BREAST:not examined LUNGS: clear to auscultation , and palpation HEART: regular rate & rhythm, no murmurs and no gallops ABDOMEN:abdomen soft, non-tender, normal bowel sounds and no masses or organomegaly BACK: Back symmetric, no curvature., No CVA tenderness EXTREMITIES:no joint deformities, effusion, or inflammation, no edema, no skin discoloration  NEURO: alert & oriented x 3 with fluent speech, no focal motor/sensory deficits  PERFORMANCE STATUS: ECOG 0  LABORATORY DATA: Lab Results  Component Value Date   WBC 9.7 08/27/2015   HGB 13.0 08/27/2015   HCT 39.8 08/27/2015   MCV 89.6 08/27/2015   PLT 289 08/27/2015      Chemistry      Component Value Date/Time   NA 138 08/27/2015 1227   NA 137 08/14/2015 2201   K 4.7 08/27/2015 1227   K 4.0 08/14/2015 2201   CL 97* 08/14/2015  2201   CO2 30* 08/27/2015 1227   CO2 26 08/14/2015 2201   BUN 22.4 08/27/2015 1227   BUN 34* 08/14/2015 2201   CREATININE 0.8 08/27/2015 1227   CREATININE 0.75 08/14/2015 2201      Component Value Date/Time   CALCIUM 9.1 08/27/2015 1227   CALCIUM 9.5 08/14/2015 2201   ALKPHOS 83 08/27/2015 1227   ALKPHOS 33* 08/01/2015 0759   AST 19 08/27/2015 1227   AST 18 08/01/2015 0759   ALT 26 08/27/2015 1227   ALT 14 08/01/2015 0759   BILITOT <0.30 08/27/2015 1227   BILITOT 0.4 08/01/2015 0759       RADIOGRAPHIC STUDIES: Dg Chest 2 View  08/14/2015  CLINICAL DATA:  65 year old female with chest pain EXAM: CHEST  2 VIEW COMPARISON:  Radiograph dated 08/04/2015 FINDINGS: Two views of the chest demonstrate annual increased density at the right lung base which appears improved compared to the prior study. Surgical suture material noted in the right upper lobe as well as surgical clips over the upper mediastinum. The left lung is  clear. There is no pleural effusion or pneumothorax. The cardiac silhouette is within normal limits. No acute osseous pathology. IMPRESSION: Postsurgical changes of partial right upper lobe resection. Residual density in the right lung base appears improved compared to prior study. Follow-up recommended. No pneumothorax. Electronically Signed   By: Anner Crete M.D.   On: 08/14/2015 22:17   Dg Chest 2 View  08/04/2015  CLINICAL DATA:  Chest tube removal yesterday with persistent chest soreness. EXAM: CHEST  2 VIEW COMPARISON:  Portable chest x-ray dated August 03, 2015 immediately following chest tube removal. FINDINGS: The small right-sided pneumothorax persists. It likely amounts to 10-15% of the lung volume. There is persistent right paratracheal soft tissue density. Surgical clips remain in the right suprahilar region. The right hemidiaphragm is less well demonstrated today. On the left the lung is well-expanded. The interstitial markings remain increased. The heart is  normal in size. The pulmonary vascularity is mildly prominent centrally but stable. The bony thorax exhibits no acute abnormality. There are degenerative changes of the shoulders. IMPRESSION: Stable appearance of the approximately 10-15% right sided pneumothorax. No large right pleural effusion. Persistently increased interstitial markings in the left lung which may reflect interstitial edema or less likely pneumonia. Electronically Signed   By: David  Martinique M.D.   On: 08/04/2015 07:47   Mr Jeri Cos HE Contrast  08/13/2015  CLINICAL DATA:  Lung cancer.  Staging. EXAM: MRI HEAD WITHOUT AND WITH CONTRAST TECHNIQUE: Multiplanar, multiecho pulse sequences of the brain and surrounding structures were obtained without and with intravenous contrast. CONTRAST:  41m MULTIHANCE GADOBENATE DIMEGLUMINE 529 MG/ML IV SOLN COMPARISON:  PET scan 06/25/2015.  MR brain 07/25/2009 FINDINGS: No evidence for acute infarction, hemorrhage, mass lesion, hydrocephalus, or extra-axial fluid. Mild generalized atrophy, with focal atrophic change in the LEFT posterior frontal parasagittal cortex. This does not have the appearance of an old infarct, and can be perhaps seen to be developing in retrospect on previous MR from 07/25/2009 (which was otherwise normal). No subcortical or cortical gliosis in this region, no evidence for prior infarction, and no associated white matter disease. Normal midline structures.  Flow voids are maintained. Post infusion, no abnormal enhancement of the brain or meninges. Extracranial soft tissues unremarkable. IMPRESSION: Slight asymmetric LEFT posterior frontal atrophic change without signs of acute or chronic cerebral infarction. No abnormal enhancement to suggest metastatic disease to the brain or meninges. No osseous lesions are identified. Electronically Signed   By: JStaci RighterM.D.   On: 08/13/2015 14:59   Dg Chest 1v Repeat Same Day  08/03/2015  CLINICAL DATA:  Status post right chest tube removal,  persistent pain EXAM: CHEST - 1 VIEW SAME DAY COMPARISON:  08/03/2015 FINDINGS: Stable small pneumothorax is noted on the right following chest tube removal. Small amount of fluid in the apex is noted as well stable from the previous exam. Postsurgical changes in the right hilum are noted. The left lung remains clear. No acute bony abnormality is seen. IMPRESSION: Status post chest tube removal. No significant increase in right pneumothorax is noted. No new focal abnormality is seen. Electronically Signed   By: MInez CatalinaM.D.   On: 08/03/2015 13:47   Dg Chest Port 1 View  08/03/2015  CLINICAL DATA:  Status post right upper lobectomy for small cell lung malignancy EXAM: PORTABLE CHEST 1 VIEW COMPARISON:  Portable chest x-ray of August 02, 2015 FINDINGS: There is mild volume loss on the right since upper lobectomy. Soft tissue density material persists in  the right paratracheal region since lobectomy. A tiny 5% or less apical pneumothorax persists. The right chest tube tip projects over the interspace between the posterior aspects of the third and fourth ribs and is stable. There is no significant pleural fluid at the lung base. There is mild shift of the mediastinum toward the left. The interstitial markings within the aerated portions of both lungs remain mildly increased. The heart is normal in size. There is mild central pulmonary vascular prominence. IMPRESSION: Stable 5% or less right apical pneumothorax. Stable mild shift of the mediastinum toward the right. Persistent mild interstitial density in both lungs. Electronically Signed   By: David  Martinique M.D.   On: 08/03/2015 07:35   Dg Chest Port 1 View  08/02/2015  CLINICAL DATA:  Chest tube removal EXAM: PORTABLE CHEST 1 VIEW COMPARISON:  Chest radiograph from one day prior. FINDINGS: There is a single remaining right apical chest tube, which has been slightly retracted in the interval. Stable cardiomediastinal silhouette with normal heart size. Small  right apical pneumothorax appears stable, approximately 10%. No left pneumothorax. No pleural effusion. Surgical clips and sutures overlie the right hilum. Stable volume loss in the right hemithorax with slight right mediastinal shift. Stable reticular opacities throughout both lungs, not appreciably changed. IMPRESSION: 1. Stable small right apical pneumothorax. 2. Stable postsurgical changes in the right hemithorax and diffuse reticular opacities throughout both lungs probably representing atelectasis. Electronically Signed   By: Ilona Sorrel M.D.   On: 08/02/2015 09:06   Dg Chest Port 1 View  08/01/2015  CLINICAL DATA:  65 year old female status post right upper lobectomy EXAM: PORTABLE CHEST 1 VIEW COMPARISON:  Prior chest x-ray 07/31/2015 FINDINGS: Stable position of right-sided chest tubes. Surgical changes of right upper lobectomy. Persistent right apical hydro pneumothorax without significant interval change. Slightly increased patchy airspace opacity in the lung bases in the setting of decreased inspiratory volumes favored to reflect atelectasis. Cardiac and mediastinal contours are unchanged. No acute osseous abnormality. IMPRESSION: 1. Slightly lower inspiratory volumes with increasing left greater than right patchy basilar airspace opacities. Atelectasis is favored although developing infiltrate in the left base is difficult to exclude radiographically. 2. Stable right apical hydropneumothorax with 2 chest tubes in place. Electronically Signed   By: Jacqulynn Cadet M.D.   On: 08/01/2015 07:52   Dg Chest Port 1 View  07/31/2015  CLINICAL DATA:  Post lobectomy EXAM: PORTABLE CHEST 1 VIEW COMPARISON:  Portable exam 0646 hours compared to 07/30/2015 FINDINGS: Pair of RIGHT thoracostomy tubes. Small RIGHT apex pneumothorax decreased since previous exam. Stable heart size, mediastinal contours and pulmonary vascularity. Question small amount of loculated pleural fluid at upper RIGHT chest as well.  Atelectasis versus infiltrate LEFT lower lobe. Remaining LEFT lung clear. LEFT glenohumeral degenerative changes. IMPRESSION: Decrease in size of RIGHT apex pneumothorax. Question small fluid component/ hydrothorax at upper RIGHT chest as well. Atelectasis versus infiltrate LEFT lower lobe. Electronically Signed   By: Lavonia Dana M.D.   On: 07/31/2015 08:17   Dg Chest Port 1 View  07/30/2015  CLINICAL DATA:  Right upper lobectomy. EXAM: PORTABLE CHEST 1 VIEW COMPARISON:  July 28, 2015. FINDINGS: The heart size and mediastinal contours are within normal limits. Minimal left basilar subsegmental atelectasis is noted. No significant pleural effusion is noted. Right subclavian catheter line is noted with tip malpositioned into the left subclavian vein. Two right-sided chest tubes are noted. Postsurgical changes are noted in the right upper lobe consistent with prior lobectomy. Moderate right apical  pneumothorax is noted. The visualized skeletal structures are unremarkable. IMPRESSION: Status post right upper lobectomy. Two right-sided chest tubes are noted with moderate right apical pneumothorax present. Right subclavian catheter line is noted, with tip malpositioned into left subclavian vein. These results will be called to the ordering clinician or representative by the Radiologist Assistant, and communication documented in the PACS or zVision Dashboard. Electronically Signed   By: Marijo Conception, M.D.   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    PLAN: I had a lengthy discussion with the patient and her husband today about her current disease stage, prognosis and treatment  options. The patient has a very good performance status. I strongly recommended for her to consider a course of adjuvant systemic chemotherapy with cisplatin 75 MG/M2 and Alimta 500 MG/M2 every 3 weeks for a total of 4 cycles. I discussed with the patient adverse effects of this treatment including but not limited to alopecia, myelosuppression, nausea and vomiting, peripheral neuropathy, liver or renal dysfunction. We will arrange for the patient to receive vitamin B 12 injection next week. The patient would also receive prescription for Compazine 10 mg by mouth every 6 hours as needed for nausea, Decadron 4 mg by mouth twice a day, the day before, day of and day after the chemotherapy in addition to folic acid 1 mg by mouth daily. I will arrange for the patient to have a chemotherapy education class before starting the first dose of the chemotherapy. After completion of the adjuvant systemic chemotherapy, the patient may benefit from a course of adjuvant radiotherapy to the mediastinum. She will also be a good candidate for treatment with targeted therapy with EGFR tyrosine kinase inhibitor if she has any evidence for disease recurrence. The patient is expected to start the first cycle of this treatment on 09/10/2015. The patient was seen during the multidisciplinary thoracic oncology clinic today by medical oncology, radiation oncology, thoracic navigator, physical therapist and social worker. She would come back for follow-up visit in 3 weeks for reevaluation and management of any adverse effect of her treatment. The patient and her husband agreed to the current plan. She was advised to call immediately if she has any concerning symptoms in the interval.  The patient voices understanding of current disease status and treatment options and is in agreement with the current care plan.  All questions were answered. The patient knows to call the clinic with any problems, questions or concerns. We can  certainly see the patient much sooner if necessary.  Thank you so much for allowing me to participate in the care of [name] West. I will continue to follow up the patient with you and assist in her care.  I spent 55 minutes counseling the patient face to face. The total time spent in the appointment was 80 minutes.  Disclaimer: This note was dictated with voice recognition software. Similar sounding words can inadvertently be transcribed and may not be corrected upon review.   MOHAMED,MOHAMED K. August 27, 2015, 2:18 PM

## 2015-08-27 NOTE — Therapy (Signed)
[place], [place], [address] Phone: [phone]   Fax:  [phone]  Physical Therapy Evaluation  Patient Details  Name: [name] West MRN: [id number] Date of Birth: [birth year] Referring Provider: Dr. Curt Bears  Encounter Date: 08/27/2015      PT End of Session - 08/27/15 2017    Visit Number 1   Number of Visits 1   PT Start Time 1962   PT Stop Time 1425   PT Time Calculation (min) 20 min   Activity Tolerance Patient tolerated treatment well   Behavior During Therapy [facility] for tasks assessed/performed      Past Medical History  Diagnosis Date  . Arthritis     shoulders  . Ovarian cyst   . Family history of adverse reaction to anesthesia     Patients grandmother died while having hand surgery; pt unsure of cause  . Environmental and seasonal allergies   . Complication of anesthesia     nausea  . PONV (postoperative nausea and vomiting)   . Anxiety     Past Surgical History  Procedure Laterality Date  . Right rotator cuff Right   . Cesarean section  1984  . Fibroid removed    . Colonoscopy with propofol N/A 09/27/2012    Procedure: COLONOSCOPY WITH PROPOFOL;  Surgeon: Garlan Fair, MD;  Location: [facility];  Service: Endoscopy;  Laterality: N/A;  . Video bronchoscopy with endobronchial navigation N/A 07/15/2015    Procedure: VIDEO BRONCHOSCOPY WITH ENDOBRONCHIAL NAVIGATION with Biopsy;  Surgeon: Collene Gobble, MD;  Location: [place];  Service: Thoracic;  Laterality: N/A;  . Video assisted thoracoscopy (vats)/ lobectomy Right 07/30/2015    Procedure: RIGHT VIDEO ASSISTED THORACOSCOPY (VATS)/RIGHT UPPER LOBECTOMY;  Surgeon: Melrose Nakayama, MD;  Location: [place];  Service: Thoracic;  Laterality: Right;    There were no vitals filed for this visit.  Visit Diagnosis:  Physical deconditioning - Plan: PT plan of care cert/re-cert  Posture imbalance - Plan: PT plan of care  cert/re-cert      Subjective Assessment - 08/27/15 2003    Subjective reports she has bad shoulders and had been told not to lift weights by a doctor because of this   Patient is accompained by: Family member  husband   Pertinent History Patient presented with left chest pain; workup showed right lung mass.  Had scans followed by VATS right upper lobectomy on 07/30/15.  Diagnosed with stage IIIA adenocarcinoma (4 cm. mass), EGFR positive.  Never smoker.  Expected to have adjuvant chemotherapy, radiation to the mediastinum, then targeted therapy.  History unremarkable except for shoulder arthritis.   Patient Stated Goals get info from all lung clinic providers   Currently in Pain? Yes   Pain Score 0-No pain   Pain Location Chest   Pain Orientation Right   Pain Type Surgical pain   Pain Onset 1 to 4 weeks ago   Multiple Pain Sites Yes   Pain Location Mouth   Aggravating Factors  resulted from intubation at surgery   Pain Relieving Factors diminishing over time            [facility] PT Assessment - 08/27/15 0001    Assessment   Medical Diagnosis lung adenocarcinoma, stage IIIA, s/p right upper lobectomy   Referring Provider Dr. Curt Bears   Onset Date/Surgical Date 07/30/15   Precautions   Precautions Other (comment)   Precaution Comments cancer precautions   Restrictions   Weight Bearing Restrictions No  Balance Screen   Has the patient fallen in the past 6 months No   Has the patient had a decrease in activity level because of a fear of falling?  No   Is the patient reluctant to leave their home because of a fear of falling?  No   Home Ecologist residence   Living Arrangements Spouse/significant other   Type of [place] Two level   Prior Function   Level of Independence Independent   Leisure walks almost daily; was walking 45 minutes, but since surgery has been walking less, and now can do 30 minutes   Cognition   Overall  Cognitive Status Within Functional Limits for tasks assessed   Functional Tests   Functional tests Sit to Stand   Sit to Stand   Comments 22 times in 30 seconds, above average for age   Posture/Postural Control   Posture/Postural Control Postural limitations   Postural Limitations Forward head  slight   ROM / Strength   AROM / PROM / Strength AROM   AROM   Overall AROM Comments trunk AROM in standing  WFL all motions   Ambulation/Gait   Ambulation/Gait Yes   Ambulation/Gait Assistance 7: Independent   Balance   Balance Assessed Yes   Dynamic Standing Balance   Dynamic Standing - Comments reaches forward 11 inches, a little below average                           PT Education - 08/27/15 2016    Education provided Yes   Education Details posture, breathing, walking, staying active through treatment, energy conservation, PT info   Person(s) Educated Patient;Spouse   Methods Explanation;Handout   Comprehension Verbalized understanding               Lung Clinic Goals - 08/27/15 2022    Patient will be able to verbalize understanding of the benefit of exercise to decrease fatigue.   Status Achieved   Patient will be able to verbalize the importance of posture.   Status Achieved   Patient will be able to demonstrate diaphragmatic breathing for improved lung function.   Status Achieved   Patient will be able to verbalize understanding of the role of physical therapy to prevent functional decline and who to contact if physical therapy is needed.   Status Achieved             Plan - 08/27/15 2017    Clinical Impression Statement Patient who looks well and until diagnosis and lobectomy was functioning independently is diagnosed with stage IIIA lung cancer, adenocarcinoma.  She has a mild posture impairment (forward head), some pain, and slightly below average forward reach; she has a h/o shoulder arthritis that limits her workouts to avoiding shoulder  irritation.  She has some post-surgical pain now.  Her status was discussed prior to evaluation at thoracic conference including MDs.     Pt will benefit from skilled therapeutic intervention in order to improve on the following deficits Decreased endurance   Rehab Potential Excellent   PT Frequency One time visit   PT Treatment/Interventions Patient/family education   PT Next Visit Plan None at this time.   PT Home Exercise Plan continue walking    Consulted and Agree with Plan of Care Patient         Problem List Patient Active Problem List   Diagnosis Date Noted  . S/P  lobectomy of lung 07/30/2015  . Non-small cell lung cancer ([place]) 06/24/2015    SALISBURY,DONNA 08/27/2015, 8:25 PM  [place], [place], [address] Phone: [phone]   Fax:  [phone]  Name: Randi Poullard MRN: [id number] Date of Birth: 10/06/1950   Serafina Royals, PT 08/27/2015 8:25 PM

## 2015-08-28 ENCOUNTER — Encounter: Payer: Self-pay | Admitting: *Deleted

## 2015-08-28 ENCOUNTER — Encounter: Payer: Self-pay | Admitting: Internal Medicine

## 2015-08-28 NOTE — Progress Notes (Signed)
left in pod-left for dr. Julien Nordmann to sign on his desk

## 2015-08-28 NOTE — Progress Notes (Signed)
Oncology Nurse Navigator Documentation  Oncology Nurse Navigator Flowsheets 08/28/2015  Navigator Location [facility]  Navigator Encounter Type Clinic/MDC  Abnormal Finding Date 06/21/2015  Confirmed Diagnosis Date 07/30/2015  Surgery Date 06/29/2015  Treatment Initiated Date 07/30/2015  Patient Visit Type Other  Treatment Phase Pre-Tx/Tx Discussion  Barriers/Navigation Needs Education;Coordination of Care  Education Newly Diagnosed Cancer Education;Understanding Cancer/ Treatment Options  Interventions Coordination of Care;Education Method  Coordination of Care Appts  Education Method Verbal;Written  Acuity Level 2  Time Spent with Patient 30   Spoke with patient on 3/176/17 at [facility]  I gave and explained information on her disease, dx, treatment options and resources here at the cancer center.    She is set up for: 3/21 Chemo Education and B12 shot 3/30 1st chemotherapy

## 2015-08-31 ENCOUNTER — Encounter: Payer: Self-pay | Admitting: Internal Medicine

## 2015-08-31 NOTE — Progress Notes (Signed)
left mess to see where she wants them mailed-to her or employer. her copy is left at front desk with ms wilma

## 2015-09-01 ENCOUNTER — Other Ambulatory Visit: Payer: BC Managed Care – PPO

## 2015-09-01 ENCOUNTER — Ambulatory Visit (HOSPITAL_BASED_OUTPATIENT_CLINIC_OR_DEPARTMENT_OTHER): Payer: BC Managed Care – PPO

## 2015-09-01 ENCOUNTER — Encounter: Payer: Self-pay | Admitting: *Deleted

## 2015-09-01 VITALS — BP 133/73 | HR 89 | Temp 98.2°F

## 2015-09-01 DIAGNOSIS — C3491 Malignant neoplasm of unspecified part of right bronchus or lung: Secondary | ICD-10-CM

## 2015-09-01 DIAGNOSIS — C3411 Malignant neoplasm of upper lobe, right bronchus or lung: Secondary | ICD-10-CM | POA: Diagnosis not present

## 2015-09-01 MED ORDER — CYANOCOBALAMIN 1000 MCG/ML IJ SOLN
1000.0000 ug | Freq: Once | INTRAMUSCULAR | Status: AC
  Administered 2015-09-01: 1000 ug via INTRAMUSCULAR

## 2015-09-03 ENCOUNTER — Encounter: Payer: Self-pay | Admitting: *Deleted

## 2015-09-03 ENCOUNTER — Encounter: Payer: Self-pay | Admitting: Internal Medicine

## 2015-09-03 ENCOUNTER — Telehealth: Payer: Self-pay | Admitting: *Deleted

## 2015-09-03 DIAGNOSIS — C3491 Malignant neoplasm of unspecified part of right bronchus or lung: Secondary | ICD-10-CM

## 2015-09-03 NOTE — Progress Notes (Signed)
Spoke w/ pt regarding copay assistance w/ [employer] for Alimta.  Pt stated she has met her deductible for the year so ins should pay 100% for her treatment.  I will give her my contact info on 09/10/15 just in case anything changes with her plan.

## 2015-09-03 NOTE — Progress Notes (Signed)
I noticed patient's MRI Brain has not been scheduled yet.  I called central scheduling.  They had a question about scan without contrast only.  I clarified order. Dr. Julien Nordmann VO MRI Brain with and without contrast.  I completed order and notified scheduling to schedule.  I will update Vaughan Basta with precert to help expedite.

## 2015-09-03 NOTE — Telephone Encounter (Signed)
Oncology Nurse Navigator Documentation  Oncology Nurse Navigator Flowsheets 09/03/2015  Navigator Location [facility]  Navigator Encounter Type Telephone  Telephone Outgoing Call  Treatment Phase Pre-Tx/Tx Discussion  Barriers/Navigation Needs Education  Education Method Verbal  Acuity Level 3  Time Spent with Patient 15   Patient called.  She had questions about taking Advil due to mouth ulcer and if she can take benadryl for a runny nose.  I spoke with Dr. Julien Nordmann. He said for patient to stop taking Advil 5 days before chemo.  I relayed that to patient and she verbalized understanding.  Patient would like to take benadryl for runny nose.  Dr. Julien Nordmann stated ok but take low dose.  I again relayed that to patient with verbalization of understanding from her.

## 2015-09-03 NOTE — Telephone Encounter (Signed)
Oncology Nurse Navigator Documentation  Oncology Nurse Navigator Flowsheets 09/03/2015  Navigator Location [facility]  Navigator Encounter Type Telephone  Telephone Outgoing Call  Treatment Phase Pre-Tx/Tx Discussion  Barriers/Navigation Needs Coordination of Care  Interventions Coordination of Care  Coordination of Care Appts  Acuity Level 2  Acuity Level 2 Assistance expediting appointments  Time Spent with Patient 15   When I spoke with patient earlier today, I noticed she was not scheduled for MRI Brain.  I updated Dr. Julien Nordmann that scan is without contrast. He stated he would like patient to have contrast.  I received VO for MRI Brain with and without contrast.  I asked per cert team to get authorization number.  I then called central scheduling and scheduled.  I called patient and left vm message with appt.  I also left central scheduling phone number to call if needing to change appt. I also stated she could call me back if needed as well.

## 2015-09-04 ENCOUNTER — Ambulatory Visit (HOSPITAL_COMMUNITY): Admission: RE | Admit: 2015-09-04 | Payer: BC Managed Care – PPO | Source: Ambulatory Visit

## 2015-09-07 ENCOUNTER — Encounter: Payer: Self-pay | Admitting: Internal Medicine

## 2015-09-07 DIAGNOSIS — C3491 Malignant neoplasm of unspecified part of right bronchus or lung: Secondary | ICD-10-CM

## 2015-09-08 MED ORDER — LORATADINE 10 MG PO TABS: 10.0000 mg | ORAL_TABLET | Freq: Every day | ORAL | Status: DC | PRN

## 2015-09-08 NOTE — Telephone Encounter (Signed)
Added claritin to med list

## 2015-09-10 ENCOUNTER — Other Ambulatory Visit (HOSPITAL_BASED_OUTPATIENT_CLINIC_OR_DEPARTMENT_OTHER): Payer: BC Managed Care – PPO

## 2015-09-10 ENCOUNTER — Ambulatory Visit: Payer: BC Managed Care – PPO | Admitting: Nutrition

## 2015-09-10 ENCOUNTER — Ambulatory Visit (HOSPITAL_BASED_OUTPATIENT_CLINIC_OR_DEPARTMENT_OTHER): Payer: BC Managed Care – PPO | Admitting: Nurse Practitioner

## 2015-09-10 ENCOUNTER — Ambulatory Visit (HOSPITAL_BASED_OUTPATIENT_CLINIC_OR_DEPARTMENT_OTHER): Payer: BC Managed Care – PPO

## 2015-09-10 VITALS — BP 111/62 | HR 86 | Temp 97.9°F | Resp 18

## 2015-09-10 DIAGNOSIS — R239 Unspecified skin changes: Secondary | ICD-10-CM

## 2015-09-10 DIAGNOSIS — C3490 Malignant neoplasm of unspecified part of unspecified bronchus or lung: Secondary | ICD-10-CM

## 2015-09-10 DIAGNOSIS — C3491 Malignant neoplasm of unspecified part of right bronchus or lung: Secondary | ICD-10-CM | POA: Diagnosis not present

## 2015-09-10 DIAGNOSIS — T7840XA Allergy, unspecified, initial encounter: Secondary | ICD-10-CM

## 2015-09-10 DIAGNOSIS — C3411 Malignant neoplasm of upper lobe, right bronchus or lung: Secondary | ICD-10-CM

## 2015-09-10 DIAGNOSIS — R918 Other nonspecific abnormal finding of lung field: Secondary | ICD-10-CM | POA: Insufficient documentation

## 2015-09-10 DIAGNOSIS — Z5111 Encounter for antineoplastic chemotherapy: Secondary | ICD-10-CM | POA: Diagnosis not present

## 2015-09-10 LAB — COMPREHENSIVE METABOLIC PANEL
ALT: 14 U/L (ref 0–55)
AST: 17 U/L (ref 5–34)
Albumin: 3.8 g/dL (ref 3.5–5.0)
Alkaline Phosphatase: 81 U/L (ref 40–150)
Anion Gap: 8 mEq/L (ref 3–11)
BUN: 20.3 mg/dL (ref 7.0–26.0)
CO2: 27 mEq/L (ref 22–29)
Calcium: 9.7 mg/dL (ref 8.4–10.4)
Chloride: 103 mEq/L (ref 98–109)
Creatinine: 0.8 mg/dL (ref 0.6–1.1)
EGFR: 78 mL/min/{1.73_m2} — ABNORMAL LOW (ref >90)
Glucose: 94 mg/dl (ref 70–140)
Potassium: 4.1 mEq/L (ref 3.5–5.1)
Sodium: 137 mEq/L (ref 136–145)
Total Bilirubin: 0.3 mg/dL (ref 0.20–1.20)
Total Protein: 7.1 g/dL (ref 6.4–8.3)

## 2015-09-10 LAB — CBC WITH DIFFERENTIAL/PLATELET
BASO%: 0.4 % (ref 0.0–2.0)
Basophils Absolute: 0.1 10*3/uL (ref 0.0–0.1)
EOS%: 0.3 % (ref 0.0–7.0)
Eosinophils Absolute: 0 10*3/uL (ref 0.0–0.5)
HCT: 41 % (ref 34.8–46.6)
HGB: 13.6 g/dL (ref 11.6–15.9)
LYMPH%: 12.2 % — ABNORMAL LOW (ref 14.0–49.7)
MCH: 29.1 pg (ref 25.1–34.0)
MCHC: 33.1 g/dL (ref 31.5–36.0)
MCV: 87.8 fL (ref 79.5–101.0)
MONO#: 0.9 10*3/uL (ref 0.1–0.9)
MONO%: 6.3 % (ref 0.0–14.0)
NEUT#: 11.5 10*3/uL — ABNORMAL HIGH (ref 1.5–6.5)
NEUT%: 80.8 % — ABNORMAL HIGH (ref 38.4–76.8)
Platelets: 354 10*3/uL (ref 145–400)
RBC: 4.67 10*6/uL (ref 3.70–5.45)
RDW: 13.2 % (ref 11.2–14.5)
WBC: 14.2 10*3/uL — ABNORMAL HIGH (ref 3.9–10.3)
lymph#: 1.7 10*3/uL (ref 0.9–3.3)

## 2015-09-10 LAB — MAGNESIUM: Magnesium: 2.3 mg/dl (ref 1.5–2.5)

## 2015-09-10 MED ORDER — FAMOTIDINE IN NACL 20-0.9 MG/50ML-% IV SOLN
20.0000 mg | Freq: Once | INTRAVENOUS | Status: AC
  Administered 2015-09-10: 20 mg via INTRAVENOUS

## 2015-09-10 MED ORDER — MANNITOL 25 % IV SOLN
Freq: Once | INTRAVENOUS | Status: AC
  Administered 2015-09-10: 10:00:00 via INTRAVENOUS
  Filled 2015-09-10: qty 10

## 2015-09-10 MED ORDER — PALONOSETRON HCL INJECTION 0.25 MG/5ML
0.2500 mg | Freq: Once | INTRAVENOUS | Status: AC
  Administered 2015-09-10: 0.25 mg via INTRAVENOUS

## 2015-09-10 MED ORDER — DIPHENHYDRAMINE HCL 50 MG/ML IJ SOLN
25.0000 mg | Freq: Once | INTRAMUSCULAR | Status: AC
  Administered 2015-09-10: 25 mg via INTRAVENOUS

## 2015-09-10 MED ORDER — FOSAPREPITANT DIMEGLUMINE INJECTION 150 MG
Freq: Once | INTRAVENOUS | Status: AC
  Administered 2015-09-10: 13:00:00 via INTRAVENOUS
  Filled 2015-09-10: qty 5

## 2015-09-10 MED ORDER — SODIUM CHLORIDE 0.9 % IV SOLN
74.0000 mg/m2 | Freq: Once | INTRAVENOUS | Status: AC
  Administered 2015-09-10: 100 mg via INTRAVENOUS
  Filled 2015-09-10: qty 100

## 2015-09-10 MED ORDER — SODIUM CHLORIDE 0.9 % IV SOLN
Freq: Once | INTRAVENOUS | Status: AC
  Administered 2015-09-10: 10:00:00 via INTRAVENOUS

## 2015-09-10 MED ORDER — PALONOSETRON HCL INJECTION 0.25 MG/5ML
INTRAVENOUS | Status: AC
  Filled 2015-09-10: qty 5

## 2015-09-10 MED ORDER — DIPHENHYDRAMINE HCL 50 MG/ML IJ SOLN
INTRAMUSCULAR | Status: AC
  Filled 2015-09-10: qty 1

## 2015-09-10 MED ORDER — SODIUM CHLORIDE 0.9 % IV SOLN
510.0000 mg/m2 | Freq: Once | INTRAVENOUS | Status: AC
  Administered 2015-09-10: 700 mg via INTRAVENOUS
  Filled 2015-09-10: qty 28

## 2015-09-10 NOTE — Patient Instructions (Signed)
[facility] Discharge Instructions for Patients Receiving Chemotherapy  Today you received the following chemotherapy agents ALIMTA AND CISPLATIN. To help prevent nausea and vomiting after your treatment, we encourage you to take your nausea medication AS NEEDED BUT AS DIRECTED AND NO ZOFRAN FOR 3 DAYS.  If you develop nausea and vomiting that is not controlled by your nausea medication, call the clinic.   BELOW ARE SYMPTOMS THAT SHOULD BE REPORTED IMMEDIATELY:  *FEVER GREATER THAN 100.5 F  *CHILLS WITH OR WITHOUT FEVER  NAUSEA AND VOMITING THAT IS NOT CONTROLLED WITH YOUR NAUSEA MEDICATION  *UNUSUAL SHORTNESS OF BREATH  *UNUSUAL BRUISING OR BLEEDING  TENDERNESS IN MOUTH AND THROAT WITH OR WITHOUT PRESENCE OF ULCERS  *URINARY PROBLEMS  *BOWEL PROBLEMS  UNUSUAL RASH Items with * indicate a potential emergency and should be followed up as soon as possible.  Feel free to call the clinic you have any questions or concerns. The clinic phone number is [phone].  Please show the [place] at check-in to the Emergency Department and triage nurse.  Pemetrexed injection What is this medicine? PEMETREXED (PEM e TREX ed) is a chemotherapy drug. This medicine affects cells that are rapidly growing, such as cancer cells and cells in your mouth and stomach. It is usually used to treat lung cancers like non-small cell lung cancer and mesothelioma. It may also be used to treat other cancers. This medicine may be used for other purposes; ask your health care provider or pharmacist if you have questions. What should I tell my health care provider before I take this medicine? They need to know if you have any of these conditions: -if you frequently drink alcohol containing beverages -infection (especially a virus infection such as chickenpox, cold sores, or herpes) -kidney disease -liver disease -low blood counts, like low platelets, red bloods, or white blood  cells -an unusual or allergic reaction to pemetrexed, mannitol, other medicines, foods, dyes, or preservatives -pregnant or trying to get pregnant -breast-feeding How should I use this medicine? This drug is given as an infusion into a vein. It is administered in a hospital or clinic by a specially trained health care professional. Talk to your pediatrician regarding the use of this medicine in children. Special care may be needed. Overdosage: If you think you have taken too much of this medicine contact a poison control center or emergency room at once. NOTE: This medicine is only for you. Do not share this medicine with others. What if I miss a dose? It is important not to miss your dose. Call your doctor or health care professional if you are unable to keep an appointment. What may interact with this medicine? -aspirin and aspirin-like medicines -medicines to increase blood counts like filgrastim, pegfilgrastim, sargramostim -methotrexate -NSAIDS, medicines for pain and inflammation, like ibuprofen or naproxen -probenecid -pyrimethamine -vaccines Talk to your doctor or health care professional before taking any of these medicines: -acetaminophen -aspirin -ibuprofen -ketoprofen -naproxen This list may not describe all possible interactions. Give your health care provider a list of all the medicines, herbs, non-prescription drugs, or dietary supplements you use. Also tell them if you smoke, drink alcohol, or use illegal drugs. Some items may interact with your medicine. What should I watch for while using this medicine? Visit your doctor for checks on your progress. This drug may make you feel generally unwell. This is not uncommon, as chemotherapy can affect healthy cells as well as cancer cells. Report any side effects. Continue your  course of treatment even though you feel ill unless your doctor tells you to stop. In some cases, you may be given additional medicines to help with side  effects. Follow all directions for their use. Call your doctor or health care professional for advice if you get a fever, chills or sore throat, or other symptoms of a cold or flu. Do not treat yourself. This drug decreases your body's ability to fight infections. Try to avoid being around people who are sick. This medicine may increase your risk to bruise or bleed. Call your doctor or health care professional if you notice any unusual bleeding. Be careful brushing and flossing your teeth or using a toothpick because you may get an infection or bleed more easily. If you have any dental work done, tell your dentist you are receiving this medicine. Avoid taking products that contain aspirin, acetaminophen, ibuprofen, naproxen, or ketoprofen unless instructed by your doctor. These medicines may hide a fever. Call your doctor or health care professional if you get diarrhea or mouth sores. Do not treat yourself. To protect your kidneys, drink water or other fluids as directed while you are taking this medicine. Men and women must use effective birth control while taking this medicine. You may also need to continue using effective birth control for a time after stopping this medicine. Do not become pregnant while taking this medicine. Tell your doctor right away if you think that you or your partner might be pregnant. There is a potential for serious side effects to an unborn child. Talk to your health care professional or pharmacist for more information. Do not breast-feed an infant while taking this medicine. This medicine may lower sperm counts. What side effects may I notice from receiving this medicine? Side effects that you should report to your doctor or health care professional as soon as possible: -allergic reactions like skin rash, itching or hives, swelling of the face, lips, or tongue -low blood counts - this medicine may decrease the number of white blood cells, red blood cells and platelets. You  may be at increased risk for infections and bleeding. -signs of infection - fever or chills, cough, sore throat, pain or difficulty passing urine -signs of decreased platelets or bleeding - bruising, pinpoint red spots on the skin, black, tarry stools, blood in the urine -signs of decreased red blood cells - unusually weak or tired, fainting spells, lightheadedness -breathing problems, like a dry cough -changes in emotions or moods -chest pain -confusion -diarrhea -high blood pressure -mouth or throat sores or ulcers -pain, swelling, warmth in the leg -pain on swallowing -swelling of the ankles, feet, hands -trouble passing urine or change in the amount of urine -vomiting -yellowing of the eyes or skin Side effects that usually do not require medical attention (report to your doctor or health care professional if they continue or are bothersome): -hair loss -loss of appetite -nausea -stomach upset This list may not describe all possible side effects. Call your doctor for medical advice about side effects. You may report side effects to FDA at 1-800-FDA-1088. Where should I keep my medicine? This drug is given in a hospital or clinic and will not be stored at home. NOTE: This sheet is a summary. It may not cover all possible information. If you have questions about this medicine, talk to your doctor, pharmacist, or health care provider.    2016, Elsevier/Gold Standard. (2008-01-01 13:24:03)  Cisplatin injection What is this medicine? CISPLATIN (SIS pla tin) is a chemotherapy  drug. It targets fast dividing cells, like cancer cells, and causes these cells to die. This medicine is used to treat many types of cancer like bladder, ovarian, and testicular cancers. This medicine may be used for other purposes; ask your health care provider or pharmacist if you have questions. What should I tell my health care provider before I take this medicine? They need to know if you have any of these  conditions: -blood disorders -hearing problems -kidney disease -recent or ongoing radiation therapy -an unusual or allergic reaction to cisplatin, carboplatin, other chemotherapy, other medicines, foods, dyes, or preservatives -pregnant or trying to get pregnant -breast-feeding How should I use this medicine? This drug is given as an infusion into a vein. It is administered in a hospital or clinic by a specially trained health care professional. Talk to your pediatrician regarding the use of this medicine in children. Special care may be needed. Overdosage: If you think you have taken too much of this medicine contact a poison control center or emergency room at once. NOTE: This medicine is only for you. Do not share this medicine with others. What if I miss a dose? It is important not to miss a dose. Call your doctor or health care professional if you are unable to keep an appointment. What may interact with this medicine? -dofetilide -foscarnet -medicines for seizures -medicines to increase blood counts like filgrastim, pegfilgrastim, sargramostim -probenecid -pyridoxine used with altretamine -rituximab -some antibiotics like amikacin, gentamicin, neomycin, polymyxin B, streptomycin, tobramycin -sulfinpyrazone -vaccines -zalcitabine Talk to your doctor or health care professional before taking any of these medicines: -acetaminophen -aspirin -ibuprofen -ketoprofen -naproxen This list may not describe all possible interactions. Give your health care provider a list of all the medicines, herbs, non-prescription drugs, or dietary supplements you use. Also tell them if you smoke, drink alcohol, or use illegal drugs. Some items may interact with your medicine. What should I watch for while using this medicine? Your condition will be monitored carefully while you are receiving this medicine. You will need important blood work done while you are taking this medicine. This drug may make  you feel generally unwell. This is not uncommon, as chemotherapy can affect healthy cells as well as cancer cells. Report any side effects. Continue your course of treatment even though you feel ill unless your doctor tells you to stop. In some cases, you may be given additional medicines to help with side effects. Follow all directions for their use. Call your doctor or health care professional for advice if you get a fever, chills or sore throat, or other symptoms of a cold or flu. Do not treat yourself. This drug decreases your body's ability to fight infections. Try to avoid being around people who are sick. This medicine may increase your risk to bruise or bleed. Call your doctor or health care professional if you notice any unusual bleeding. Be careful brushing and flossing your teeth or using a toothpick because you may get an infection or bleed more easily. If you have any dental work done, tell your dentist you are receiving this medicine. Avoid taking products that contain aspirin, acetaminophen, ibuprofen, naproxen, or ketoprofen unless instructed by your doctor. These medicines may hide a fever. Do not become pregnant while taking this medicine. Women should inform their doctor if they wish to become pregnant or think they might be pregnant. There is a potential for serious side effects to an unborn child. Talk to your health care professional or pharmacist for  more information. Do not breast-feed an infant while taking this medicine. Drink fluids as directed while you are taking this medicine. This will help protect your kidneys. Call your doctor or health care professional if you get diarrhea. Do not treat yourself. What side effects may I notice from receiving this medicine? Side effects that you should report to your doctor or health care professional as soon as possible: -allergic reactions like skin rash, itching or hives, swelling of the face, lips, or tongue -signs of infection -  fever or chills, cough, sore throat, pain or difficulty passing urine -signs of decreased platelets or bleeding - bruising, pinpoint red spots on the skin, black, tarry stools, nosebleeds -signs of decreased red blood cells - unusually weak or tired, fainting spells, lightheadedness -breathing problems -changes in hearing -gout pain -low blood counts - This drug may decrease the number of white blood cells, red blood cells and platelets. You may be at increased risk for infections and bleeding. -nausea and vomiting -pain, swelling, redness or irritation at the injection site -pain, tingling, numbness in the hands or feet -problems with balance, movement -trouble passing urine or change in the amount of urine Side effects that usually do not require medical attention (report to your doctor or health care professional if they continue or are bothersome): -changes in vision -loss of appetite -metallic taste in the mouth or changes in taste This list may not describe all possible side effects. Call your doctor for medical advice about side effects. You may report side effects to FDA at 1-800-FDA-1088. Where should I keep my medicine? This drug is given in a hospital or clinic and will not be stored at home. NOTE: This sheet is a summary. It may not cover all possible information. If you have questions about this medicine, talk to your doctor, pharmacist, or health care provider.    2016, Elsevier/Gold Standard. (2007-09-04 14:40:54)

## 2015-09-10 NOTE — Progress Notes (Signed)
Patient felt fine, no complaints and flushing resolved.

## 2015-09-10 NOTE — Progress Notes (Signed)
65 year old female diagnosed with NSCLC.  She is a patient of Dr. Julien Nordmann.  Past medical history includes arthritis and anxiety.  Medications include vitamin D, Celexa, Decadron, Ativan, multivitamin, MiraLAX, and Compazine.  Labs were reviewed.  Height: 4 feet 11 inches. Weight: 96.3 pounds March 16. Usual body weight: 95 pounds. BMI: 19.44.  Patient denies nutrition impact symptoms. Today is her first chemotherapy. She has follow-up questions after chemotherapy education. Patient concerned about food safety and whey protein supplements.  Nutrition Diagnosis:   Food and Nutrition Related Knowledge Deficit related to new diagnosis of lung cancer and associated treatments as evidenced by no prior need for nutrition related information.  Intervention:   Educated patient on food safety and provided a fact sheet. Educated patient to consume small frequent meals and snacks consisting of high calorie, high protein foods to maintain weight. Provided information on evidenced based sources of information. Questions answered and teach back method used.  Monitoring, Evaluation, Goals:   Patient will tolerate increased calories and protein to maintain weight.  Next Visit: Thursday, April 20 during infusion.  **Disclaimer: This note was dictated with voice recognition software. Similar sounding words can inadvertently be transcribed and this note may contain transcription errors which may not have been corrected upon publication of note.**

## 2015-09-10 NOTE — Progress Notes (Signed)
CYndee NP stated we are ok to proceed with restarting Cisplatin and IVF. Patient has not had a return of symptoms.

## 2015-09-10 NOTE — Progress Notes (Signed)
Patient reported now that she is warm again with some flushing to her upper neck. No distress, has about 29m of IVF left to infuse. Cyndee aware and told me to continue to infuse. Pt agreed

## 2015-09-10 NOTE — Progress Notes (Signed)
AT 1855 pt reported she was "very hot" she was not diaphoretic or hot to touch. Temp oral was taken and was 98.2 F. She had no other symptoms and had been reporting she was cold all day up until this point. Cisplatin infusing and IVF hydration also. Both were stopped and Cyndee Berniece Salines came over to assess while NS was infusing. She reported within 5 minutes it has resolved. Benadryl and Pepcid given as ordered.

## 2015-09-11 ENCOUNTER — Telehealth: Payer: Self-pay | Admitting: Nutrition

## 2015-09-11 ENCOUNTER — Telehealth: Payer: Self-pay | Admitting: *Deleted

## 2015-09-11 NOTE — Telephone Encounter (Signed)
TC from patient this am stating that she woke up this morning with a red, raised rash across her chest and a very reddened face. Also states she noted some red 'stripes' under her breasts.  She states that some of the redness has started to fade somewhat as the morning goes on.  Pt states she has rosacea but this reddened face is 'over the top'. Denies SOB, urticaria, dysphagia.   Instructed to take Benadryl 25 mg now. Pt states she only weighs 91 lbs and wants to start with 12.5 mg. Advised her that was ok . Informed pt that I would discuss her situation with [name], NP in [facility] who saw her yesterday in the infusion room for similar chemo reaction(cisplatin) yesterday.  Per [name], NP pt to take Benadryl 12.5 mg every 6 hours and Pepcid 20 mg every 12 hours for the next 24-48 hours. If symptoms do not resolve, pt is to come in to be seen in the [facility]. Advised pt of the above and she voiced understanding.  Pt also seeking to clarify if she should stop her MVI with minerals per advice of Ernestene Kiel, nutritionist. Will discuss with Pamala Hurry and Pamala Hurry will call pt.  Asked pt to call us back this afternoon to let us know how she is feeling. Pt voiced understanding.

## 2015-09-11 NOTE — Telephone Encounter (Signed)
error 

## 2015-09-11 NOTE — Telephone Encounter (Signed)
Documentation under addended note from 09/11/15

## 2015-09-11 NOTE — Telephone Encounter (Signed)
VM message received from patient @ 12:53 am.. TC back to patient . She states her rash is resolving and almost completely gone. Denies any other symptomolgy. Pt states she will continue the benadryl and pepcid for next 24 hours. And she stated she would call back if anything changes.

## 2015-09-11 NOTE — Telephone Encounter (Signed)
Chemo follow up done per [facility].

## 2015-09-11 NOTE — Telephone Encounter (Signed)
I contacted patient by telephone to answer questions about taking multivitamin. Patient takes a general multivitamin for women over 50 and wondered if she could continue to take during chemotherapy. I explained that a multivitamin is generally safe to take during chemotherapy. Encouraged patient to discuss with M.D. as needed.

## 2015-09-14 ENCOUNTER — Encounter: Payer: Self-pay | Admitting: Emergency Medicine

## 2015-09-14 ENCOUNTER — Encounter: Payer: Self-pay | Admitting: Nurse Practitioner

## 2015-09-14 DIAGNOSIS — T7840XA Allergy, unspecified, initial encounter: Secondary | ICD-10-CM | POA: Insufficient documentation

## 2015-09-14 NOTE — Assessment & Plan Note (Signed)
Patient presents to the [facility] today to receive cycle 1 of her Alimta/cisplatin chemotherapy regimen.  Patient developed a mild, questionable reaction to the cisplatin portion of her chemotherapy.  See further notes for details.  Patient was able to complete all of her chemotherapy as planned today.  Patient is scheduled to return on 09/17/2015 for labs and follow up visit.

## 2015-09-14 NOTE — Progress Notes (Signed)
SYMPTOM MANAGEMENT CLINIC    Chief Complaint: Hypersensitivity reaction   HPI:  [name] West 65 y.o. female diagnosed with lung cancer.  Presents to initiate Alimta/cisplatin chemotherapy today.   Patient presents to the [facility] today to receive cycle 1 of her Alimta/cisplatin chemotherapy regimen.  Patient developed a mild, questionable reaction to the cisplatin portion of her chemotherapy.  See further notes for details.  Patient was able to complete all of her chemotherapy as planned today.  Patient is scheduled to return on 09/17/2015 for labs and follow up visit.    Non-small cell cancer of right lung ([place])   07/15/2015 Initial Diagnosis Non-small cell lung cancer ([place]) Right upper lobe, diagnosed by navigational bronchoscopy    ROS  Past Medical History  Diagnosis Date  . Arthritis     shoulders  . Ovarian cyst   . Family history of adverse reaction to anesthesia     Patients grandmother died while having hand surgery; pt unsure of cause  . Environmental and seasonal allergies   . Complication of anesthesia     nausea  . PONV (postoperative nausea and vomiting)   . Anxiety     Past Surgical History  Procedure Laterality Date  . Right rotator cuff Right   . Cesarean section  1984  . Fibroid removed    . Colonoscopy with propofol N/A 09/27/2012    Procedure: COLONOSCOPY WITH PROPOFOL;  Surgeon: Garlan Fair, MD;  Location: [facility];  Service: Endoscopy;  Laterality: N/A;  . Video bronchoscopy with endobronchial navigation N/A 07/15/2015    Procedure: VIDEO BRONCHOSCOPY WITH ENDOBRONCHIAL NAVIGATION with Biopsy;  Surgeon: Collene Gobble, MD;  Location: [place];  Service: Thoracic;  Laterality: N/A;  . Video assisted thoracoscopy (vats)/ lobectomy Right 07/30/2015    Procedure: RIGHT VIDEO ASSISTED THORACOSCOPY (VATS)/RIGHT UPPER LOBECTOMY;  Surgeon: Melrose Nakayama, MD;  Location: [place];  Service: Thoracic;  Laterality: Right;    has Non-small cell  cancer of right lung ([place]); S/P lobectomy of lung; and Hypersensitivity reaction on her problem list.    is allergic to avelox; cephalosporins; clindamycin/lincomycin; penicillins; macrobid; omnipaque; adhesive; doxycycline; lidoderm; pseudoephedrine; and sulfa antibiotics.    Medication List       This list is accurate as of: 09/10/15 11:59 PM.  Always use your most recent med list.               acetaminophen 500 MG tablet  Commonly known as:  TYLENOL  Take 2 tablets (1,000 mg total) by mouth every 6 (six) hours as needed.     chlorhexidine 0.12 % solution  Commonly known as:  PERIDEX  Rinse with 1/2 ounce by mouth for 30 seconds then spit out. use t...  (REFER TO PRESCRIPTION NOTES).     cholecalciferol 1000 units tablet  Commonly known as:  VITAMIN D  Take 1,000 Units by mouth daily.     citalopram 10 MG tablet  Commonly known as:  CELEXA  Take 10 mg by mouth every morning.     dexamethasone 4 MG tablet  Commonly known as:  DECADRON  4 mg by mouth twice a day the day before, day of and day after the chemotherapy every 3 weeks     fexofenadine 180 MG tablet  Commonly known as:  ALLEGRA  Take 180 mg by mouth daily.     FINACEA 15 % cream  Generic drug:  Azelaic Acid  Apply 1 application topically 2 (two) times daily. After skin is  thoroughly washed and patted dry, gently but thoroughly massage a thin film of azelaic acid cream into the affected area twice daily, in the morning and evening.     folic acid 1 MG tablet  Commonly known as:  FOLVITE  Take 1 tablet (1 mg total) by mouth daily.     loratadine 10 MG tablet  Commonly known as:  CLARITIN  Take 1 tablet (10 mg total) by mouth daily as needed for allergies.     LORazepam 0.5 MG tablet  Commonly known as:  ATIVAN  Take 0.5 mg by mouth as needed. Prior to [facility]     MIRVASO 0.33 % Gel  Generic drug:  Brimonidine Tartrate  Apply 1 application topically daily.     multivitamin with minerals Tabs tablet  Take  1 tablet by mouth daily.     polyethylene glycol powder powder  Commonly known as:  MIRALAX  Take 255 g by mouth once.     prochlorperazine 10 MG tablet  Commonly known as:  COMPAZINE  Take 1 tablet (10 mg total) by mouth every 6 (six) hours as needed for nausea or vomiting.     zolpidem 5 MG tablet  Commonly known as:  AMBIEN  Take 5 mg by mouth at bedtime as needed for sleep. Reported on 08/27/2015         PHYSICAL EXAMINATION  Oncology Vitals 09/10/2015 09/10/2015  Height - -  Weight - -  Weight (lbs) - -  BMI (kg/m2) - -  Temp - 97.9  Pulse 86 84  Resp 18 17  SpO2 97 98  BSA (m2) - -   BP Readings from Last 2 Encounters:  09/10/15 111/62  09/01/15 133/73    Physical Exam  Constitutional: She is oriented to person, place, and time and well-developed, well-nourished, and in no distress.  HENT:  Head: Normocephalic and atraumatic.  Eyes: Conjunctivae and EOM are normal. Pupils are equal, round, and reactive to light. Right eye exhibits no discharge. Left eye exhibits no discharge. No scleral icterus.  Neck: Normal range of motion.  Pulmonary/Chest: Effort normal. No stridor. No respiratory distress.  Neurological: She is alert and oriented to person, place, and time.  Skin: Skin is warm and dry. No rash noted.  Psychiatric: Affect normal.    LABORATORY DATA:. Appointment on 09/10/2015  Component Date Value Ref Range Status  . Magnesium 09/10/2015 2.3  1.5 - 2.5 mg/dl Final  . WBC 09/10/2015 14.2* 3.9 - 10.3 10e3/uL Final  . NEUT# 09/10/2015 11.5* 1.5 - 6.5 10e3/uL Final  . HGB 09/10/2015 13.6  11.6 - 15.9 g/dL Final  . HCT 09/10/2015 41.0  34.8 - 46.6 % Final  . Platelets 09/10/2015 354  145 - 400 10e3/uL Final  . MCV 09/10/2015 87.8  79.5 - 101.0 fL Final  . MCH 09/10/2015 29.1  25.1 - 34.0 pg Final  . MCHC 09/10/2015 33.1  31.5 - 36.0 g/dL Final  . RBC 09/10/2015 4.67  3.70 - 5.45 10e6/uL Final  . RDW 09/10/2015 13.2  11.2 - 14.5 % Final  . lymph#  09/10/2015 1.7  0.9 - 3.3 10e3/uL Final  . MONO# 09/10/2015 0.9  0.1 - 0.9 10e3/uL Final  . Eosinophils Absolute 09/10/2015 0.0  0.0 - 0.5 10e3/uL Final  . Basophils Absolute 09/10/2015 0.1  0.0 - 0.1 10e3/uL Final  . NEUT% 09/10/2015 80.8* 38.4 - 76.8 % Final  . LYMPH% 09/10/2015 12.2* 14.0 - 49.7 % Final  . MONO% 09/10/2015 6.3  0.0 -  14.0 % Final  . EOS% 09/10/2015 0.3  0.0 - 7.0 % Final  . BASO% 09/10/2015 0.4  0.0 - 2.0 % Final  . Sodium 09/10/2015 137  136 - 145 mEq/L Final  . Potassium 09/10/2015 4.1  3.5 - 5.1 mEq/L Final  . Chloride 09/10/2015 103  98 - 109 mEq/L Final  . CO2 09/10/2015 27  22 - 29 mEq/L Final  . Glucose 09/10/2015 94  70 - 140 mg/dl Final   Glucose reference range is for nonfasting patients. Fasting glucose reference range is 70- 100.  Marland Kitchen BUN 09/10/2015 20.3  7.0 - 26.0 mg/dL Final  . Creatinine 09/10/2015 0.8  0.6 - 1.1 mg/dL Final  . Total Bilirubin 09/10/2015 0.30  0.20 - 1.20 mg/dL Final  . Alkaline Phosphatase 09/10/2015 81  40 - 150 U/L Final  . AST 09/10/2015 17  5 - 34 U/L Final  . ALT 09/10/2015 14  0 - 55 U/L Final  . Total Protein 09/10/2015 7.1  6.4 - 8.3 g/dL Final  . Albumin 09/10/2015 3.8  3.5 - 5.0 g/dL Final  . Calcium 09/10/2015 9.7  8.4 - 10.4 mg/dL Final  . Anion Gap 09/10/2015 8  3 - 11 mEq/L Final  . EGFR 09/10/2015 78* >90 ml/min/1.73 m2 Final   eGFR is calculated using the CKD-EPI Creatinine Equation (2009)    RADIOGRAPHIC STUDIES: No results found.  ASSESSMENT/PLAN:    Non-small cell cancer of right lung [facility]) Patient presents to the [facility] today to receive cycle 1 of her Alimta/cisplatin chemotherapy regimen.  Patient developed a mild, questionable reaction to the cisplatin portion of her chemotherapy.  See further notes for details.  Patient was able to complete all of her chemotherapy as planned today.  Patient is scheduled to return on 09/17/2015 for labs and follow up visit.  Hypersensitivity reaction Patient  presents to the [facility] today to receive cycle 1 of her Alimta/cisplatin chemotherapy regimen.  Patient developed a mild, questionable reaction to the cisplatin portion of her chemotherapy; with plate of flushing and feeling hot.  Infusion was held; and patient received both Benadryl and Pepcid per hypersensitivity protocol.  All symptoms completely resolved; and patient was able to complete her chemotherapy as planned.  Patient stated understanding of all instructions; and was in agreement with this plan of care. The patient knows to call the clinic with any problems, questions or concerns.   Review/collaboration with Dr. Julien Nordmann regarding all aspects of patient's visit today.   Total time spent with patient was 15 minutes;  with greater than 75 percent of that time spent in face to face counseling regarding patient's symptoms,  and coordination of care and follow up.  Disclaimer:This dictation was prepared with Dragon/digital dictation along with [employer]. Any transcriptional errors that result from this process are unintentional.  Drue Second, NP 09/14/2015

## 2015-09-14 NOTE — Assessment & Plan Note (Signed)
Patient presents to the [facility] today to receive cycle 1 of her Alimta/cisplatin chemotherapy regimen.  Patient developed a mild, questionable reaction to the cisplatin portion of her chemotherapy; with plate of flushing and feeling hot.  Infusion was held; and patient received both Benadryl and Pepcid per hypersensitivity protocol.  All symptoms completely resolved; and patient was able to complete her chemotherapy as planned.

## 2015-09-17 ENCOUNTER — Other Ambulatory Visit: Payer: Self-pay | Admitting: Internal Medicine

## 2015-09-17 ENCOUNTER — Encounter: Payer: Self-pay | Admitting: Internal Medicine

## 2015-09-17 ENCOUNTER — Other Ambulatory Visit (HOSPITAL_BASED_OUTPATIENT_CLINIC_OR_DEPARTMENT_OTHER): Payer: BC Managed Care – PPO

## 2015-09-17 ENCOUNTER — Telehealth: Payer: Self-pay | Admitting: Internal Medicine

## 2015-09-17 ENCOUNTER — Ambulatory Visit (HOSPITAL_BASED_OUTPATIENT_CLINIC_OR_DEPARTMENT_OTHER): Payer: BC Managed Care – PPO | Admitting: Internal Medicine

## 2015-09-17 ENCOUNTER — Telehealth: Payer: Self-pay | Admitting: *Deleted

## 2015-09-17 VITALS — BP 114/66 | HR 77 | Temp 98.6°F | Resp 18 | Ht 59.0 in | Wt 92.8 lb

## 2015-09-17 DIAGNOSIS — C3491 Malignant neoplasm of unspecified part of right bronchus or lung: Secondary | ICD-10-CM

## 2015-09-17 DIAGNOSIS — C3411 Malignant neoplasm of upper lobe, right bronchus or lung: Secondary | ICD-10-CM | POA: Diagnosis not present

## 2015-09-17 DIAGNOSIS — R05 Cough: Secondary | ICD-10-CM

## 2015-09-17 DIAGNOSIS — R053 Chronic cough: Secondary | ICD-10-CM | POA: Insufficient documentation

## 2015-09-17 DIAGNOSIS — C3490 Malignant neoplasm of unspecified part of unspecified bronchus or lung: Secondary | ICD-10-CM

## 2015-09-17 DIAGNOSIS — R059 Cough, unspecified: Secondary | ICD-10-CM

## 2015-09-17 HISTORY — DX: Cough, unspecified: R05.9

## 2015-09-17 LAB — COMPREHENSIVE METABOLIC PANEL
ALT: 26 U/L (ref 0–55)
AST: 21 U/L (ref 5–34)
Albumin: 3.5 g/dL (ref 3.5–5.0)
Alkaline Phosphatase: 87 U/L (ref 40–150)
Anion Gap: 6 mEq/L (ref 3–11)
BUN: 23.6 mg/dL (ref 7.0–26.0)
CO2: 30 mEq/L — ABNORMAL HIGH (ref 22–29)
Calcium: 9.3 mg/dL (ref 8.4–10.4)
Chloride: 102 mEq/L (ref 98–109)
Creatinine: 0.8 mg/dL (ref 0.6–1.1)
EGFR: 79 mL/min/{1.73_m2} — ABNORMAL LOW (ref >90)
Glucose: 89 mg/dl (ref 70–140)
Potassium: 4.7 mEq/L (ref 3.5–5.1)
Sodium: 138 mEq/L (ref 136–145)
Total Bilirubin: <0.3 mg/dL (ref 0.20–1.20)
Total Protein: 6.4 g/dL (ref 6.4–8.3)

## 2015-09-17 LAB — CBC WITH DIFFERENTIAL/PLATELET
BASO%: 0.5 % (ref 0.0–2.0)
Basophils Absolute: 0 10*3/uL (ref 0.0–0.1)
EOS%: 3.3 % (ref 0.0–7.0)
Eosinophils Absolute: 0.3 10*3/uL (ref 0.0–0.5)
HCT: 40.1 % (ref 34.8–46.6)
HGB: 13.3 g/dL (ref 11.6–15.9)
LYMPH%: 20.3 % (ref 14.0–49.7)
MCH: 29.1 pg (ref 25.1–34.0)
MCHC: 33.1 g/dL (ref 31.5–36.0)
MCV: 88 fL (ref 79.5–101.0)
MONO#: 1.2 10*3/uL — ABNORMAL HIGH (ref 0.1–0.9)
MONO%: 14.2 % — ABNORMAL HIGH (ref 0.0–14.0)
NEUT#: 5.1 10*3/uL (ref 1.5–6.5)
NEUT%: 61.7 % (ref 38.4–76.8)
Platelets: 275 10*3/uL (ref 145–400)
RBC: 4.56 10*6/uL (ref 3.70–5.45)
RDW: 12.9 % (ref 11.2–14.5)
WBC: 8.3 10*3/uL (ref 3.9–10.3)
lymph#: 1.7 10*3/uL (ref 0.9–3.3)

## 2015-09-17 LAB — MAGNESIUM: Magnesium: 2 mg/dl (ref 1.5–2.5)

## 2015-09-17 MED ORDER — BENZONATATE 100 MG PO CAPS: 200.0000 mg | ORAL_CAPSULE | Freq: Three times a day (TID) | ORAL | Status: DC | PRN

## 2015-09-17 NOTE — Telephone Encounter (Signed)
Gave and printed appt sched and avs for pt for April thru JUNE

## 2015-09-17 NOTE — Progress Notes (Signed)
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   PRIOR THERAPY: Right VATS with right upper lobectomy with bronchoplasty closure and en bloc resection of a wedge from the superior segment of the right lower lobe in addition to mediastinal lymph node dissection on 07/30/2015 under the care of Dr. Roxan Hockey.  CURRENT THERAPY: Adjuvant systemic chemotherapy with cisplatin 75 MG/M2 and Alimta 500 MG/M2 every 3 weeks. First dose was given on 09/10/2015. Status post one cycle.  INTERVAL HISTORY: [name] West 64 y.o. female returns to the clinic today for follow-up visit accompanied by her husband. The patient tolerated the first week of her treatment fairly well with no significant adverse effect except for mild fatigue and nausea on the third day. Her nausea improved with Compazine. She continues to have dry cough but no significant chest pain, shortness of breath or hemoptysis. She lost 2 pounds last week but she is gaining weight back. She has no fever or chills. She is here today for evaluation with repeat blood work.  MEDICAL HISTORY: Past Medical History  Diagnosis Date  . Arthritis     shoulders  . Ovarian cyst   . Family history of adverse reaction to anesthesia     Patients grandmother died while having hand surgery; pt unsure of cause  . Environmental and seasonal allergies   . Complication of anesthesia     nausea  . PONV (postoperative nausea and vomiting)   . Anxiety     ALLERGIES:  is allergic to avelox; cephalosporins; clindamycin/lincomycin; penicillins; macrobid; omnipaque; adhesive; doxycycline;  lidoderm; pseudoephedrine; and sulfa antibiotics.  MEDICATIONS:  Current Outpatient Prescriptions  Medication Sig Dispense Refill  . acetaminophen (TYLENOL) 500 MG tablet Take 2 tablets (1,000 mg total) by mouth every 6 (six) hours as needed. 30 tablet 0  . Azelaic Acid (FINACEA) 15 % cream Apply 1 application topically 2 (two) times daily. After skin is thoroughly washed and patted dry, gently but thoroughly massage a thin film of azelaic acid cream into the affected area twice daily, in the morning and evening.    . Brimonidine Tartrate (MIRVASO) 0.33 % GEL Apply 1 application topically daily.    . chlorhexidine (PERIDEX) 0.12 % solution Rinse with 1/2 ounce by mouth for 30 seconds then spit out. use t...  (REFER TO PRESCRIPTION NOTES).  0  . cholecalciferol (VITAMIN D) 1000 UNITS tablet Take 1,000 Units by mouth daily.    . citalopram (CELEXA) 10 MG tablet Take 10 mg by mouth every morning.    Marland Kitchen dexamethasone (DECADRON) 4 MG tablet 4 mg by mouth twice a day the day before, day of and day after the chemotherapy every 3 weeks 40 tablet 1  . folic acid (FOLVITE) 1 MG tablet Take 1 tablet (1 mg total) by mouth daily. 30 tablet 4  . loratadine (CLARITIN) 10 MG tablet Take 1 tablet (10 mg total) by mouth daily as needed for allergies.  0  . LORazepam (ATIVAN) 0.5 MG tablet Take 0.5 mg by mouth as needed. Prior to [facility]    . Multiple Vitamin (MULTIVITAMIN WITH MINERALS) TABS Take 1 tablet by mouth daily.    . polyethylene glycol  powder (MIRALAX) powder Take 255 g by mouth once. (Patient not taking: Reported on 08/27/2015) 255 g 0  . prochlorperazine (COMPAZINE) 10 MG tablet Take 1 tablet (10 mg total) by mouth every 6 (six) hours as needed for nausea or vomiting. 30 tablet 0  . zolpidem (AMBIEN) 5 MG tablet Take 5 mg by mouth at bedtime as needed for sleep. Reported on 08/27/2015     No current facility-administered medications for this visit.    SURGICAL HISTORY:  Past Surgical History  Procedure  Laterality Date  . Right rotator cuff Right   . Cesarean section  1984  . Fibroid removed    . Colonoscopy with propofol N/A 09/27/2012    Procedure: COLONOSCOPY WITH PROPOFOL;  Surgeon: Garlan Fair, MD;  [facility]: [facility];  Service: Endoscopy;  Laterality: N/A;  . Video bronchoscopy with endobronchial navigation N/A 07/15/2015    Procedure: VIDEO BRONCHOSCOPY WITH ENDOBRONCHIAL NAVIGATION with Biopsy;  Surgeon: Collene Gobble, MD;  Location: [facility];  Service: Thoracic;  Laterality: N/A;  . Video assisted thoracoscopy (vats)/ lobectomy Right 07/30/2015    Procedure: RIGHT VIDEO ASSISTED THORACOSCOPY (VATS)/RIGHT UPPER LOBECTOMY;  Surgeon: Melrose Nakayama, MD;  Location: [facility];  Service: Thoracic;  Laterality: Right;    REVIEW OF SYSTEMS:  A comprehensive review of systems was negative except for: Constitutional: positive for fatigue Gastrointestinal: positive for nausea   PHYSICAL EXAMINATION: General appearance: alert, cooperative and no distress Head: Normocephalic, without obvious abnormality, atraumatic Neck: no adenopathy, no JVD, supple, symmetrical, trachea midline and thyroid not enlarged, symmetric, no tenderness/mass/nodules Lymph nodes: Cervical, supraclavicular, and axillary nodes normal. Resp: clear to auscultation bilaterally Back: symmetric, no curvature. ROM normal. No CVA tenderness. Cardio: regular rate and rhythm, S1, S2 normal, no murmur, click, rub or gallop GI: soft, non-tender; bowel sounds normal; no masses,  no organomegaly Extremities: extremities normal, atraumatic, no cyanosis or edema  ECOG PERFORMANCE STATUS: 1 - Symptomatic but completely ambulatory  There were no vitals taken for this visit.  LABORATORY DATA: Lab Results  Component Value Date   WBC 8.3 09/17/2015   HGB 13.3 09/17/2015   HCT 40.1 09/17/2015   MCV 88.0 09/17/2015   PLT 275 09/17/2015      Chemistry      Component Value Date/Time   NA 137 09/10/2015 0826   NA 137  08/14/2015 2201   K 4.1 09/10/2015 0826   K 4.0 08/14/2015 2201   CL 97* 08/14/2015 2201   CO2 27 09/10/2015 0826   CO2 26 08/14/2015 2201   BUN 20.3 09/10/2015 0826   BUN 34* 08/14/2015 2201   CREATININE 0.8 09/10/2015 0826   CREATININE 0.75 08/14/2015 2201      Component Value Date/Time   CALCIUM 9.7 09/10/2015 0826   CALCIUM 9.5 08/14/2015 2201   ALKPHOS 81 09/10/2015 0826   ALKPHOS 33* 08/01/2015 0759   AST 17 09/10/2015 0826   AST 18 08/01/2015 0759   ALT 14 09/10/2015 0826   ALT 14 08/01/2015 0759   BILITOT 0.30 09/10/2015 0826   BILITOT 0.4 08/01/2015 0759       RADIOGRAPHIC STUDIES: No results found.  ASSESSMENT AND PLAN: This is a very pleasant 65 years old white female recently diagnosed with a stage IIIa non-small cell lung cancer status post right upper lobectomy with lymph node dissection and currently undergoing adjuvant systemic chemotherapy with cisplatin and Alimta status post 1 cycle. She tolerated the first cycle of her treatment well except for redness and rash on  the chest and face during the treatment which resolved several hours after her treatment. I recommended for the patient to continue her current treatment with cisplatin and Alimta as scheduled. She is expected to start cycle #2 in 2 weeks. She was advised to call immediately if she has any concerning symptoms in the interval. The patient voices understanding of current disease status and treatment options and is in agreement with the current care plan.  All questions were answered. The patient knows to call the clinic with any problems, questions or concerns. We can certainly see the patient much sooner if necessary.  Disclaimer: This note was dictated with voice recognition software. Similar sounding words can inadvertently be transcribed and may not be corrected upon review.

## 2015-09-17 NOTE — Telephone Encounter (Signed)
Oncology Nurse Navigator Documentation  Oncology Nurse Navigator Flowsheets 09/17/2015  Navigator Location [facility]  Navigator Encounter Type Telephone  Telephone Outgoing Call  Treatment Phase First Chemo Tx;Treatment  Barriers/Navigation Needs Education  Education Pain/ Symptom Management  Interventions Coordination of Care  Coordination of Care Other  Education Method Verbal  Acuity Level 2  Acuity Level 2 Ongoing guidance and education throughout treatment as needed;Other  Time Spent with Patient 5   Spoke with patient today during her visit with Dr. Julien Nordmann.  Education on chemo and next steps given.    [name] West called after her appt.  She can not take delsom over the counter.  I updated Dr. Julien Nordmann and called patient to let her know she has a prescription at pharmacy to help with cough. Patient was thankful for the call.

## 2015-09-18 ENCOUNTER — Encounter: Payer: Self-pay | Admitting: Internal Medicine

## 2015-09-18 NOTE — Progress Notes (Signed)
Pt changed her mind & wanted to apply w/ [employer] for copay assistance for Alimta but unfortunately she exceeds the income requirements.

## 2015-09-23 ENCOUNTER — Encounter: Payer: Self-pay | Admitting: Internal Medicine

## 2015-09-24 ENCOUNTER — Other Ambulatory Visit (HOSPITAL_BASED_OUTPATIENT_CLINIC_OR_DEPARTMENT_OTHER): Payer: BC Managed Care – PPO

## 2015-09-24 DIAGNOSIS — C3490 Malignant neoplasm of unspecified part of unspecified bronchus or lung: Secondary | ICD-10-CM | POA: Diagnosis not present

## 2015-09-24 LAB — CBC WITH DIFFERENTIAL/PLATELET
BASO%: 0.6 % (ref 0.0–2.0)
Basophils Absolute: 0 10*3/uL (ref 0.0–0.1)
EOS%: 1.5 % (ref 0.0–7.0)
Eosinophils Absolute: 0.1 10*3/uL (ref 0.0–0.5)
HCT: 40 % (ref 34.8–46.6)
HGB: 13.4 g/dL (ref 11.6–15.9)
LYMPH%: 20.5 % (ref 14.0–49.7)
MCH: 29.2 pg (ref 25.1–34.0)
MCHC: 33.5 g/dL (ref 31.5–36.0)
MCV: 87.3 fL (ref 79.5–101.0)
MONO#: 0.9 10*3/uL (ref 0.1–0.9)
MONO%: 10.5 % (ref 0.0–14.0)
NEUT#: 5.8 10*3/uL (ref 1.5–6.5)
NEUT%: 66.9 % (ref 38.4–76.8)
Platelets: 241 10*3/uL (ref 145–400)
RBC: 4.58 10*6/uL (ref 3.70–5.45)
RDW: 13.4 % (ref 11.2–14.5)
WBC: 8.7 10*3/uL (ref 3.9–10.3)
lymph#: 1.8 10*3/uL (ref 0.9–3.3)

## 2015-09-24 LAB — COMPREHENSIVE METABOLIC PANEL
ALT: 16 U/L (ref 0–55)
AST: 20 U/L (ref 5–34)
Albumin: 3.8 g/dL (ref 3.5–5.0)
Alkaline Phosphatase: 74 U/L (ref 40–150)
Anion Gap: 8 mEq/L (ref 3–11)
BUN: 22 mg/dL (ref 7.0–26.0)
CO2: 28 mEq/L (ref 22–29)
Calcium: 9.7 mg/dL (ref 8.4–10.4)
Chloride: 104 mEq/L (ref 98–109)
Creatinine: 0.9 mg/dL (ref 0.6–1.1)
EGFR: 68 mL/min/{1.73_m2} — ABNORMAL LOW (ref >90)
Glucose: 89 mg/dl (ref 70–140)
Potassium: 4.5 mEq/L (ref 3.5–5.1)
Sodium: 140 mEq/L (ref 136–145)
Total Bilirubin: 0.38 mg/dL (ref 0.20–1.20)
Total Protein: 6.7 g/dL (ref 6.4–8.3)

## 2015-09-24 LAB — MAGNESIUM: Magnesium: 2.3 mg/dl (ref 1.5–2.5)

## 2015-09-25 ENCOUNTER — Telehealth: Payer: Self-pay | Admitting: Medical Oncology

## 2015-09-25 ENCOUNTER — Other Ambulatory Visit: Payer: Self-pay | Admitting: Medical Oncology

## 2015-09-25 DIAGNOSIS — R059 Cough, unspecified: Secondary | ICD-10-CM

## 2015-09-25 DIAGNOSIS — R05 Cough: Secondary | ICD-10-CM

## 2015-09-25 MED ORDER — HYDROCODONE-HOMATROPINE 5-1.5 MG/5ML PO SYRP: 5.0000 mL | ORAL_SOLUTION | Freq: Four times a day (QID) | ORAL | Status: DC | PRN

## 2015-09-25 NOTE — Telephone Encounter (Signed)
Dr Julien Nordmann ordered [facility] for cough. I left message for pt to call me back.

## 2015-09-25 NOTE — Telephone Encounter (Signed)
[name] West did not help cough . She reports persistent barking cough , ear stuffiness and ear pain.

## 2015-10-01 ENCOUNTER — Ambulatory Visit (HOSPITAL_BASED_OUTPATIENT_CLINIC_OR_DEPARTMENT_OTHER): Payer: BC Managed Care – PPO

## 2015-10-01 ENCOUNTER — Ambulatory Visit: Payer: Medicare Other | Admitting: Nutrition

## 2015-10-01 ENCOUNTER — Ambulatory Visit (HOSPITAL_BASED_OUTPATIENT_CLINIC_OR_DEPARTMENT_OTHER): Payer: BC Managed Care – PPO | Admitting: Nurse Practitioner

## 2015-10-01 ENCOUNTER — Other Ambulatory Visit (HOSPITAL_BASED_OUTPATIENT_CLINIC_OR_DEPARTMENT_OTHER): Payer: BC Managed Care – PPO

## 2015-10-01 VITALS — BP 122/72 | HR 77 | Temp 98.4°F | Resp 18 | Ht 59.0 in | Wt 95.0 lb

## 2015-10-01 DIAGNOSIS — C3411 Malignant neoplasm of upper lobe, right bronchus or lung: Secondary | ICD-10-CM

## 2015-10-01 DIAGNOSIS — C3491 Malignant neoplasm of unspecified part of right bronchus or lung: Secondary | ICD-10-CM

## 2015-10-01 DIAGNOSIS — C3490 Malignant neoplasm of unspecified part of unspecified bronchus or lung: Secondary | ICD-10-CM

## 2015-10-01 DIAGNOSIS — Z5111 Encounter for antineoplastic chemotherapy: Secondary | ICD-10-CM | POA: Diagnosis not present

## 2015-10-01 LAB — COMPREHENSIVE METABOLIC PANEL
ALT: 15 U/L (ref 0–55)
AST: 17 U/L (ref 5–34)
Albumin: 3.9 g/dL (ref 3.5–5.0)
Alkaline Phosphatase: 73 U/L (ref 40–150)
Anion Gap: 10 mEq/L (ref 3–11)
BUN: 18.1 mg/dL (ref 7.0–26.0)
CO2: 25 mEq/L (ref 22–29)
Calcium: 9.9 mg/dL (ref 8.4–10.4)
Chloride: 105 mEq/L (ref 98–109)
Creatinine: 0.8 mg/dL (ref 0.6–1.1)
EGFR: 77 mL/min/{1.73_m2} — ABNORMAL LOW (ref >90)
Glucose: 98 mg/dl (ref 70–140)
Potassium: 4.1 mEq/L (ref 3.5–5.1)
Sodium: 140 mEq/L (ref 136–145)
Total Bilirubin: 0.41 mg/dL (ref 0.20–1.20)
Total Protein: 7 g/dL (ref 6.4–8.3)

## 2015-10-01 LAB — CBC WITH DIFFERENTIAL/PLATELET
BASO%: 0.4 % (ref 0.0–2.0)
Basophils Absolute: 0 10*3/uL (ref 0.0–0.1)
EOS%: 0.2 % (ref 0.0–7.0)
Eosinophils Absolute: 0 10*3/uL (ref 0.0–0.5)
HCT: 43.2 % (ref 34.8–46.6)
HGB: 14.2 g/dL (ref 11.6–15.9)
LYMPH%: 9.5 % — ABNORMAL LOW (ref 14.0–49.7)
MCH: 29.4 pg (ref 25.1–34.0)
MCHC: 32.9 g/dL (ref 31.5–36.0)
MCV: 89.3 fL (ref 79.5–101.0)
MONO#: 0.5 10*3/uL (ref 0.1–0.9)
MONO%: 4.9 % (ref 0.0–14.0)
NEUT#: 9 10*3/uL — ABNORMAL HIGH (ref 1.5–6.5)
NEUT%: 85 % — ABNORMAL HIGH (ref 38.4–76.8)
Platelets: 293 10*3/uL (ref 145–400)
RBC: 4.84 10*6/uL (ref 3.70–5.45)
RDW: 14.2 % (ref 11.2–14.5)
WBC: 10.6 10*3/uL — ABNORMAL HIGH (ref 3.9–10.3)
lymph#: 1 10*3/uL (ref 0.9–3.3)

## 2015-10-01 LAB — MAGNESIUM: Magnesium: 2.1 mg/dl (ref 1.5–2.5)

## 2015-10-01 MED ORDER — PALONOSETRON HCL INJECTION 0.25 MG/5ML
0.2500 mg | Freq: Once | INTRAVENOUS | Status: AC
  Administered 2015-10-01: 0.25 mg via INTRAVENOUS

## 2015-10-01 MED ORDER — FAMOTIDINE IN NACL 20-0.9 MG/50ML-% IV SOLN
20.0000 mg | Freq: Once | INTRAVENOUS | Status: AC
  Administered 2015-10-01: 20 mg via INTRAVENOUS

## 2015-10-01 MED ORDER — SODIUM CHLORIDE 0.9 % IV SOLN
74.0000 mg/m2 | Freq: Once | INTRAVENOUS | Status: AC
  Administered 2015-10-01: 100 mg via INTRAVENOUS
  Filled 2015-10-01: qty 100

## 2015-10-01 MED ORDER — DIPHENHYDRAMINE HCL 50 MG/ML IJ SOLN
25.0000 mg | Freq: Once | INTRAMUSCULAR | Status: AC
  Administered 2015-10-01: 25 mg via INTRAVENOUS

## 2015-10-01 MED ORDER — DIPHENHYDRAMINE HCL 50 MG/ML IJ SOLN
INTRAMUSCULAR | Status: AC
  Filled 2015-10-01: qty 1

## 2015-10-01 MED ORDER — SODIUM CHLORIDE 0.9 % IV SOLN
Freq: Once | INTRAVENOUS | Status: AC
  Administered 2015-10-01: 12:00:00 via INTRAVENOUS
  Filled 2015-10-01: qty 5

## 2015-10-01 MED ORDER — SODIUM CHLORIDE 0.9 % IV SOLN
510.0000 mg/m2 | Freq: Once | INTRAVENOUS | Status: AC
  Administered 2015-10-01: 700 mg via INTRAVENOUS
  Filled 2015-10-01: qty 24

## 2015-10-01 MED ORDER — MANNITOL 25 % IV SOLN
Freq: Once | INTRAVENOUS | Status: AC
  Administered 2015-10-01: 10:00:00 via INTRAVENOUS
  Filled 2015-10-01: qty 10

## 2015-10-01 MED ORDER — PALONOSETRON HCL INJECTION 0.25 MG/5ML
INTRAVENOUS | Status: AC
  Filled 2015-10-01: qty 5

## 2015-10-01 MED ORDER — SODIUM CHLORIDE 0.9 % IV SOLN
Freq: Once | INTRAVENOUS | Status: AC
  Administered 2015-10-01: 10:00:00 via INTRAVENOUS

## 2015-10-01 MED ORDER — FAMOTIDINE IN NACL 20-0.9 MG/50ML-% IV SOLN
INTRAVENOUS | Status: AC
  Filled 2015-10-01: qty 50

## 2015-10-01 NOTE — Patient Instructions (Signed)
[facility] Discharge Instructions for Patients Receiving Chemotherapy  Today you received the following chemotherapy agents:  Alimta, Cisplatin  To help prevent nausea and vomiting after your treatment, we encourage you to take your nausea medication as prescribed.   If you develop nausea and vomiting that is not controlled by your nausea medication, call the clinic.   BELOW ARE SYMPTOMS THAT SHOULD BE REPORTED IMMEDIATELY:  *FEVER GREATER THAN 100.5 F  *CHILLS WITH OR WITHOUT FEVER  NAUSEA AND VOMITING THAT IS NOT CONTROLLED WITH YOUR NAUSEA MEDICATION  *UNUSUAL SHORTNESS OF BREATH  *UNUSUAL BRUISING OR BLEEDING  TENDERNESS IN MOUTH AND THROAT WITH OR WITHOUT PRESENCE OF ULCERS  *URINARY PROBLEMS  *BOWEL PROBLEMS  UNUSUAL RASH Items with * indicate a potential emergency and should be followed up as soon as possible.  Feel free to call the clinic you have any questions or concerns. The clinic phone number is [phone].  Please show the [facility] at check-in to the Emergency Department and triage nurse.

## 2015-10-01 NOTE — Progress Notes (Signed)
Nutrition follow-up completed with patient during infusion for non-small cell lung cancer. Weight is relatively stable and documented as 95 pounds April 20. Patient denies nutrition impact symptoms. Patient states she is trying to eat more food more often.  Nutrition diagnosis: Food and nutrition related knowledge deficit improved.  Intervention: Reviewed importance of continuing to practice adequate food safety. Encouraged patient to continue small frequent meals and snacks consisting of high-calorie, high-protein foods. Questions were answered.  Teach back method used.  Monitoring, evaluation, goals: Patient will tolerate increased calories and protein to promote weight maintenance.  Next visit: Thursday, May 11, during infusion.  **Disclaimer: This note was dictated with voice recognition software. Similar sounding words can inadvertently be transcribed and this note may contain transcription errors which may not have been corrected upon publication of note.**

## 2015-10-01 NOTE — Progress Notes (Signed)
  [name] West OFFICE PROGRESS NOTE    DIAGNOSIS: Stage IIIA (T2a, N2, M0) non-small cell lung cancer, adenocarcinoma with positive EGFR mutation with deletion in exon 19, presented with right upper lobe lung mass in addition to mediastinal lymphadenopathy status post surgical resection in February 2017.   PRIOR THERAPY: Right VATS with right upper lobectomy with bronchoplasty closure and en bloc resection of a wedge from the superior segment of the right lower lobe in addition to mediastinal lymph node dissection on 07/30/2015 under the care of Dr. Roxan Hockey.  CURRENT THERAPY: Adjuvant systemic chemotherapy with cisplatin 75 MG/M2 and Alimta 500 MG/M2 every 3 weeks. First dose was given on 09/10/2015. Status post one cycle. Cycle 2 10/01/2015.  INTERVAL HISTORY:   [name] West returns as scheduled. She completed cycle 1 adjuvant cisplatin/Alimta 09/10/2015. She had 2 episodes of nausea with vomiting on day 4. No mouth sores. No diarrhea. She became flushed during the chemotherapy infusion. This was felt to be related to the steroids. She began steroids last night and notes some flushing this morning. Ears became "stuffy" one and a half weeks after treatment. This has resolved. No tinnitus or hearing loss. No numbness or tingling in her hands or feet. She reports a persistent cough since surgery. No fever. Mild dyspnea on exertion.  Objective:  Vital signs in last 24 hours:  Blood pressure 122/72, pulse 77, temperature 98.4 F (36.9 C), temperature source Oral, resp. rate 18, height '4\' 11"'$  (1.499 m), weight 95 lb (43.092 kg), SpO2 99 %.    HEENT: No thrush or ulcers. Resp: Lungs clear bilaterally. Cardio: Regular rate and rhythm. GI: Abdomen soft and nontender. No hepatomegaly. Vascular: No leg edema. Calves soft and nontender. Skin: No rash.    Lab Results:  Lab Results  Component Value Date   WBC 10.6* 10/01/2015   HGB 14.2 10/01/2015   HCT 43.2 10/01/2015   MCV  89.3 10/01/2015   PLT 293 10/01/2015   NEUTROABS 9.0* 10/01/2015    Imaging:  No results found.  Medications: I have reviewed the patient's current medications.  Assessment/Plan: 1. Stage IIIa non-small cell lung cancer status post right upper lobectomy with lymph node dissection and currently undergoing adjuvant systemic chemotherapy with cisplatin and Alimta status post cycle 1 on 09/10/2015.   Disposition: [name] West appears stable. She has completed 1 cycle of adjuvant cisplatin/Alimta. Plan to proceed with cycle 2 today as scheduled. She will return for a follow-up visit and cycle 3 in 3 weeks. Plan to continue weekly labs in the interim. She will contact the office prior to her next visit with any problems.    Ned Card ANP/GNP-BC   10/01/2015  9:43 AM

## 2015-10-02 ENCOUNTER — Telehealth: Payer: Self-pay | Admitting: *Deleted

## 2015-10-02 NOTE — Telephone Encounter (Signed)
Pt call lmov[facility] " i'm having blushing again like last time from the steriods. I've been taking benadryl q 6hrs and pepcid BID."  Returned call to pt instructed to continue taking above meds until rash has resolved. Pt voiced she went for a walk this morning and upon return she was slightly short of breath, but it quickly resolved.  Discussed with pt importance of rest, encouraged hydration and well balanced meals. Pt encouraged to call with any concerns. No further concerns at this time.

## 2015-10-06 ENCOUNTER — Telehealth: Payer: Self-pay | Admitting: Nurse Practitioner

## 2015-10-06 ENCOUNTER — Telehealth: Payer: Self-pay

## 2015-10-06 DIAGNOSIS — R059 Cough, unspecified: Secondary | ICD-10-CM

## 2015-10-06 DIAGNOSIS — R05 Cough: Secondary | ICD-10-CM

## 2015-10-06 MED ORDER — BENZONATATE 100 MG PO CAPS: 200.0000 mg | ORAL_CAPSULE | Freq: Three times a day (TID) | ORAL | Status: DC | PRN

## 2015-10-06 NOTE — Addendum Note (Signed)
Addended by: Ardeen [facility] on: 10/06/2015 03:02 PM   Modules accepted: Orders, Medications

## 2015-10-06 NOTE — Telephone Encounter (Signed)
I called pt back and offered lab and [facility] tomorrow -she agrees. Onc Tx request sent

## 2015-10-06 NOTE — Telephone Encounter (Signed)
Patient calling today wanting to talk with Dr. Worthy Flank nurse.  Patient states that she received chemo last Thursday , has had a cough since her surgery last February and started with a hoarse voice 2 days ago.  Patient denies fever and sore throat however is very fatigued.

## 2015-10-06 NOTE — Telephone Encounter (Signed)
per pof to sch pt appt-cld & spoke to pt and gave pt appt time & date for 4/26

## 2015-10-07 ENCOUNTER — Other Ambulatory Visit (HOSPITAL_BASED_OUTPATIENT_CLINIC_OR_DEPARTMENT_OTHER): Payer: BC Managed Care – PPO

## 2015-10-07 ENCOUNTER — Encounter: Payer: Self-pay | Admitting: Nurse Practitioner

## 2015-10-07 ENCOUNTER — Other Ambulatory Visit: Payer: Self-pay | Admitting: *Deleted

## 2015-10-07 ENCOUNTER — Ambulatory Visit (HOSPITAL_COMMUNITY)
Admission: RE | Admit: 2015-10-07 | Discharge: 2015-10-07 | Disposition: A | Discharge status: Home / Self Care | Payer: BC Managed Care – PPO | Source: Ambulatory Visit | Attending: Nurse Practitioner | Admitting: Nurse Practitioner

## 2015-10-07 ENCOUNTER — Ambulatory Visit (HOSPITAL_BASED_OUTPATIENT_CLINIC_OR_DEPARTMENT_OTHER): Payer: BC Managed Care – PPO | Admitting: Nurse Practitioner

## 2015-10-07 VITALS — BP 111/60 | HR 90 | Temp 98.4°F | Resp 18 | Ht <= 58 in | Wt 93.1 lb

## 2015-10-07 DIAGNOSIS — R059 Cough, unspecified: Secondary | ICD-10-CM

## 2015-10-07 DIAGNOSIS — C3491 Malignant neoplasm of unspecified part of right bronchus or lung: Secondary | ICD-10-CM

## 2015-10-07 DIAGNOSIS — C3490 Malignant neoplasm of unspecified part of unspecified bronchus or lung: Secondary | ICD-10-CM | POA: Diagnosis not present

## 2015-10-07 DIAGNOSIS — R05 Cough: Secondary | ICD-10-CM | POA: Diagnosis present

## 2015-10-07 DIAGNOSIS — Z902 Acquired absence of lung [part of]: Secondary | ICD-10-CM | POA: Diagnosis not present

## 2015-10-07 LAB — COMPREHENSIVE METABOLIC PANEL
ALT: 22 U/L (ref 0–55)
AST: 19 U/L (ref 5–34)
Albumin: 3.7 g/dL (ref 3.5–5.0)
Alkaline Phosphatase: 71 U/L (ref 40–150)
Anion Gap: 7 mEq/L (ref 3–11)
BUN: 25.9 mg/dL (ref 7.0–26.0)
CO2: 27 mEq/L (ref 22–29)
Calcium: 9.1 mg/dL (ref 8.4–10.4)
Chloride: 103 mEq/L (ref 98–109)
Creatinine: 0.8 mg/dL (ref 0.6–1.1)
EGFR: 80 mL/min/{1.73_m2} — ABNORMAL LOW (ref >90)
Glucose: 106 mg/dl (ref 70–140)
Potassium: 4.3 mEq/L (ref 3.5–5.1)
Sodium: 137 mEq/L (ref 136–145)
Total Bilirubin: 0.31 mg/dL (ref 0.20–1.20)
Total Protein: 6.2 g/dL — ABNORMAL LOW (ref 6.4–8.3)

## 2015-10-07 LAB — CBC WITH DIFFERENTIAL/PLATELET
BASO%: 0.6 % (ref 0.0–2.0)
Basophils Absolute: 0 10*3/uL (ref 0.0–0.1)
EOS%: 3 % (ref 0.0–7.0)
Eosinophils Absolute: 0.2 10*3/uL (ref 0.0–0.5)
HCT: 40.9 % (ref 34.8–46.6)
HGB: 13.5 g/dL (ref 11.6–15.9)
LYMPH%: 29 % (ref 14.0–49.7)
MCH: 29.1 pg (ref 25.1–34.0)
MCHC: 33.1 g/dL (ref 31.5–36.0)
MCV: 88.1 fL (ref 79.5–101.0)
MONO#: 0.8 10*3/uL (ref 0.1–0.9)
MONO%: 12 % (ref 0.0–14.0)
NEUT#: 3.7 10*3/uL (ref 1.5–6.5)
NEUT%: 55.4 % (ref 38.4–76.8)
Platelets: 189 10*3/uL (ref 145–400)
RBC: 4.64 10*6/uL (ref 3.70–5.45)
RDW: 14.2 % (ref 11.2–14.5)
WBC: 6.7 10*3/uL (ref 3.9–10.3)
lymph#: 1.9 10*3/uL (ref 0.9–3.3)

## 2015-10-07 LAB — MAGNESIUM: Magnesium: 1.9 mg/dl (ref 1.5–2.5)

## 2015-10-08 ENCOUNTER — Encounter: Payer: Self-pay | Admitting: Nurse Practitioner

## 2015-10-08 ENCOUNTER — Other Ambulatory Visit: Payer: BC Managed Care – PPO

## 2015-10-08 NOTE — Assessment & Plan Note (Signed)
Patient received cycle 2 of her cisplatin/Alimta chemotherapy regimen on 10/01/2015.  Patient is scheduled for labs only on 10/15/2015.  She is scheduled to follow-up with her pulmonologist Dr. Roxan Hockey on 10/20/2015.  She is scheduled for labs, visit, and chemotherapy again on 10/22/2015.

## 2015-10-08 NOTE — Assessment & Plan Note (Signed)
Patient states that she has been experiencing a chronic, dry, nonproductive cough since she was intubated for her right lobectomy on July 30, 2015.  She denies any chest pain, chest pressure, shortness of breath, or pain with inspiration.  She states that she does suffer with some chronic sinus drainage.  She takes Claritin on a regular basis.  She states that she has also tried [employer] and Hycodan cough syrup.  She states she has coughed so much recently; that she has lost her voice on occasion.  She denies any recent fevers or chills.  Exam today reveals breath sounds clear bilaterally.  No shortness of breath or acute respiratory distress noted.  All labs were essentially stable today; and vitals signs were stable as well.  Patient was afebrile and O2 sat was 100% on room air.  Chest x-ray revealed no acute findings.  Reviewed all findings with Dr. Julien Nordmann today.  Of note-patient already sees pulmonologist Dr. Roxan Hockey.  Dr. Julien Nordmann recommended that patient obtain an ear, nose and throat referral for further evaluation of chronic cough.  Also, will forward today's note and x-ray results to Dr. Roxan Hockey pulmonologist as well.  Patient is scheduled to follow-up with Dr. Roxan Hockey already on 10/20/2015.  Patient was advised to call/return or go directly to the emergency department for any worsening symptoms whatsoever.

## 2015-10-08 NOTE — Progress Notes (Signed)
SYMPTOM MANAGEMENT CLINIC    Chief Complaint: Chronic cough  HPI:  [name] West 65 y.o. female diagnosed with lung cancer.  Currently undergoing cisplatin/Alimta chemotherapy regimen.   Patient states that she has been experiencing a chronic, dry, nonproductive cough since she was intubated for her right lobectomy on July 30, 2015.  She denies any chest pain, chest pressure, shortness of breath, or pain with inspiration.  She states that she does suffer with some chronic sinus drainage.  She takes Claritin on a regular basis.  She states that she has also tried [employer] and Hycodan cough syrup.  She states she has coughed so much recently; that she has lost her voice on occasion.  She denies any recent fevers or chills.  Exam today reveals breath sounds clear bilaterally.  No shortness of breath or acute respiratory distress noted.  All labs were essentially stable today; and vitals signs were stable as well.  Patient was afebrile and O2 sat was 100% on room air.  Chest x-ray revealed no acute findings.  Reviewed all findings with Dr. Julien Nordmann today.  Of note-patient already sees pulmonologist Dr. Roxan Hockey.  Dr. Julien Nordmann recommended that patient obtain an ear, nose and throat referral for further evaluation of chronic cough.  Also, will forward today's note and x-ray results to Dr. Roxan Hockey pulmonologist as well.  Patient is scheduled to follow-up with Dr. Roxan Hockey already on 10/20/2015.  Patient was advised to call/return or go directly to the emergency department for any worsening symptoms whatsoever.  Oncology History   Patient presented with left sided CP.  Work up showed lung mass.   Non-small cell cancer of right lung [facility])   Staging form: Lung, AJCC 7th Edition     Clinical stage from 08/27/2015: Stage IIIA (T2a, N2, M0) - Signed by Curt Bears, MD on 08/27/2015       Non-small cell cancer of right lung ([facility])   06/21/2015 Imaging CT Chest There is a 3.7  x 4.5 x 4.0 cm sub solid pulmonary mass versus area of airspace consolidation in the right upper lobe. Spiculations are seen extending to the anterior and lateral pleural surfaces.   06/25/2015 Imaging PET IMPRESSION:  There is malignant range FDG uptake associated with the masslike density within the right upper lobe.    07/15/2015 Initial Diagnosis Non-small cell lung cancer ([facility]) Right upper lobe, diagnosed by navigational bronchoscopy   07/15/2015 Surgery Operation: Flexible video fiberoptic bronchoscopy with electromagnetic navigation and biopsies   07/30/2015 Surgery PROCEDURE:  Right video-assisted thoracoscopy Thoracoscopic right upper lobectomy with bronchoplastic closure and en bloc resection of a wedge from the superior segment of the right lower lobe Mediastinal lymph node dissection.   09/01/2015 -  Chemotherapy 1st chemotherapy    Review of Systems  Respiratory: Positive for cough.   All other systems reviewed and are negative.   Past Medical History  Diagnosis Date  . Arthritis     shoulders  . Ovarian cyst   . Family history of adverse reaction to anesthesia     Patients grandmother died while having hand surgery; pt unsure of cause  . Environmental and seasonal allergies   . Complication of anesthesia     nausea  . PONV (postoperative nausea and vomiting)   . Anxiety   . Cough 09/17/2015    Past Surgical History  Procedure Laterality Date  . Right rotator cuff Right   . Cesarean section  1984  . Fibroid removed    . Colonoscopy with  propofol N/A 09/27/2012    Procedure: COLONOSCOPY WITH PROPOFOL;  Surgeon: Garlan Fair, MD;  Location: [facility];  Service: Endoscopy;  Laterality: N/A;  . Video bronchoscopy with endobronchial navigation N/A 07/15/2015    Procedure: VIDEO BRONCHOSCOPY WITH ENDOBRONCHIAL NAVIGATION with Biopsy;  Surgeon: Collene Gobble, MD;  Location: [facility];  Service: Thoracic;  Laterality: N/A;  . Video assisted thoracoscopy (vats)/ lobectomy Right  07/30/2015    Procedure: RIGHT VIDEO ASSISTED THORACOSCOPY (VATS)/RIGHT UPPER LOBECTOMY;  Surgeon: Melrose Nakayama, MD;  Location: [facility];  Service: Thoracic;  Laterality: Right;    has Non-small cell cancer of right lung ([facility]); S/P lobectomy of lung; and Cough on her problem list.    is allergic to avelox; cephalosporins; clindamycin/lincomycin; penicillins; macrobid; omnipaque; adhesive; doxycycline; lidoderm; pseudoephedrine; and sulfa antibiotics.    Medication List       This list is accurate as of: 10/07/15 11:59 PM.  Always use your most recent med list.               acetaminophen 500 MG tablet  Commonly known as:  TYLENOL  Take 2 tablets (1,000 mg total) by mouth every 6 (six) hours as needed.     cholecalciferol 1000 units tablet  Commonly known as:  VITAMIN D  Take 1,000 Units by mouth daily.     citalopram 10 MG tablet  Commonly known as:  CELEXA  Take 10 mg by mouth every morning.     dexamethasone 4 MG tablet  Commonly known as:  DECADRON  4 mg by mouth twice a day the day before, day of and day after the chemotherapy every 3 weeks     FINACEA 15 % cream  Generic drug:  Azelaic Acid  Apply 1 application topically 2 (two) times daily. After skin is thoroughly washed and patted dry, gently but thoroughly massage a thin film of azelaic acid cream into the affected area twice daily, in the morning and evening.     folic acid 1 MG tablet  Commonly known as:  FOLVITE  Take 1 tablet (1 mg total) by mouth daily.     HYDROcodone-homatropine 5-1.5 MG/5ML syrup  Commonly known as:  HYCODAN  Take 5 mLs by mouth every 6 (six) hours as needed for cough.     loratadine 10 MG tablet  Commonly known as:  CLARITIN  Take 1 tablet (10 mg total) by mouth daily as needed for allergies.     LORazepam 0.5 MG tablet  Commonly known as:  ATIVAN  Take 0.5 mg by mouth as needed. Reported on 10/07/2015     MIRVASO 0.33 % Gel  Generic drug:  Brimonidine Tartrate  Apply 1  application topically daily.     multivitamin with minerals Tabs tablet  Take 1 tablet by mouth daily.     polyethylene glycol powder powder  Commonly known as:  MIRALAX  Take 255 g by mouth once.     prochlorperazine 10 MG tablet  Commonly known as:  COMPAZINE  Take 1 tablet (10 mg total) by mouth every 6 (six) hours as needed for nausea or vomiting.     zolpidem 5 MG tablet  Commonly known as:  AMBIEN  Take 5 mg by mouth at bedtime as needed for sleep. Reported on 10/07/2015         PHYSICAL EXAMINATION  Oncology Vitals 10/07/2015 10/01/2015  Height 125 cm 150 cm  Weight 42.23 kg 43.092 kg  Weight (lbs) 93 lbs 2 oz 95 lbs  BMI (kg/m2) 27.26 kg/m2 19.19 kg/m2  Temp 98.4 98.4  Pulse 90 77  Resp 18 18  SpO2 100 99  BSA (m2) 1.21 m2 1.34 m2   BP Readings from Last 2 Encounters:  10/07/15 111/60  10/01/15 122/72    Physical Exam  Constitutional: She is oriented to person, place, and time and well-developed, well-nourished, and in no distress.  HENT:  Head: Normocephalic and atraumatic.  Mouth/Throat: Oropharynx is clear and moist.  Eyes: Conjunctivae and EOM are normal. Pupils are equal, round, and reactive to light. Right eye exhibits no discharge. Left eye exhibits no discharge. No scleral icterus.  Neck: Normal range of motion. Neck supple. No JVD present. No tracheal deviation present. No thyromegaly present.  Cardiovascular: Normal rate, regular rhythm, normal heart sounds and intact distal pulses.   Pulmonary/Chest: Effort normal and breath sounds normal. No respiratory distress. She has no wheezes. She has no rales. She exhibits no tenderness.  Patient has a chronic, dry cough.  Abdominal: Soft. Bowel sounds are normal. She exhibits no distension and no mass. There is no tenderness. There is no rebound and no guarding.  Musculoskeletal: Normal range of motion. She exhibits no edema or tenderness.  Lymphadenopathy:    She has no cervical adenopathy.  Neurological:  She is alert and oriented to person, place, and time. Gait normal.  Skin: Skin is warm and dry. No rash noted. No erythema. No pallor.  Psychiatric: Affect normal.    LABORATORY DATA:. Appointment on 10/07/2015  Component Date Value Ref Range Status  . Magnesium 10/07/2015 1.9  1.5 - 2.5 mg/dl Final  . WBC 10/07/2015 6.7  3.9 - 10.3 10e3/uL Final  . NEUT# 10/07/2015 3.7  1.5 - 6.5 10e3/uL Final  . HGB 10/07/2015 13.5  11.6 - 15.9 g/dL Final  . HCT 10/07/2015 40.9  34.8 - 46.6 % Final  . Platelets 10/07/2015 189  145 - 400 10e3/uL Final  . MCV 10/07/2015 88.1  79.5 - 101.0 fL Final  . MCH 10/07/2015 29.1  25.1 - 34.0 pg Final  . MCHC 10/07/2015 33.1  31.5 - 36.0 g/dL Final  . RBC 10/07/2015 4.64  3.70 - 5.45 10e6/uL Final  . RDW 10/07/2015 14.2  11.2 - 14.5 % Final  . lymph# 10/07/2015 1.9  0.9 - 3.3 10e3/uL Final  . MONO# 10/07/2015 0.8  0.1 - 0.9 10e3/uL Final  . Eosinophils Absolute 10/07/2015 0.2  0.0 - 0.5 10e3/uL Final  . Basophils Absolute 10/07/2015 0.0  0.0 - 0.1 10e3/uL Final  . NEUT% 10/07/2015 55.4  38.4 - 76.8 % Final  . LYMPH% 10/07/2015 29.0  14.0 - 49.7 % Final  . MONO% 10/07/2015 12.0  0.0 - 14.0 % Final  . EOS% 10/07/2015 3.0  0.0 - 7.0 % Final  . BASO% 10/07/2015 0.6  0.0 - 2.0 % Final  . Sodium 10/07/2015 137  136 - 145 mEq/L Final  . Potassium 10/07/2015 4.3  3.5 - 5.1 mEq/L Final  . Chloride 10/07/2015 103  98 - 109 mEq/L Final  . CO2 10/07/2015 27  22 - 29 mEq/L Final  . Glucose 10/07/2015 106  70 - 140 mg/dl Final   Glucose reference range is for nonfasting patients. Fasting glucose reference range is 70- 100.  Marland Kitchen BUN 10/07/2015 25.9  7.0 - 26.0 mg/dL Final  . Creatinine 10/07/2015 0.8  0.6 - 1.1 mg/dL Final  . Total Bilirubin 10/07/2015 0.31  0.20 - 1.20 mg/dL Final  . Alkaline Phosphatase 10/07/2015 71  40 -  150 U/L Final  . AST 10/07/2015 19  5 - 34 U/L Final  . ALT 10/07/2015 22  0 - 55 U/L Final  . Total Protein 10/07/2015 6.2* 6.4 - 8.3 g/dL Final    . Albumin 10/07/2015 3.7  3.5 - 5.0 g/dL Final  . Calcium 10/07/2015 9.1  8.4 - 10.4 mg/dL Final  . Anion Gap 10/07/2015 7  3 - 11 mEq/L Final  . EGFR 10/07/2015 80* >90 ml/min/1.73 m2 Final   eGFR is calculated using the CKD-EPI Creatinine Equation (2009)    RADIOGRAPHIC STUDIES: Dg Chest 2 View  10/07/2015  CLINICAL DATA:  Nonproductive cough since lung cancer surgery in February 2017. EXAM: CHEST  2 VIEW COMPARISON:  08/14/2015 FINDINGS: Evidence of previous right upper lobectomy. Slight tethering of the right hemidiaphragm with slight scarring around the right hilum. Aeration in the right lung has improved since the prior study. Left lung is clear. Heart size and vascularity are normal. No acute bone abnormality. 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 IMPRESSION: No acute abnormality. Postsurgical scarring in the right hemi thorax. Improved aeration. Electronically Signed   By: Lorriane Shire M.D.   On: 10/07/2015 12:30    ASSESSMENT/PLAN:    Cough Patient states that she has been experiencing a chronic, dry, nonproductive cough since she was intubated for her right lobectomy on July 30, 2015.  She denies any chest pain, chest pressure, shortness of breath, or pain with inspiration.  She states that she does suffer with some chronic sinus drainage.  She takes Claritin on a regular basis.  She states that she has also tried [employer] and Hycodan cough syrup.  She states she has coughed so much recently; that she has lost her voice on occasion.  She denies any recent fevers or chills.  Exam today reveals breath sounds clear bilaterally.  No shortness of breath or acute respiratory distress noted.  All labs were essentially stable today; and vitals signs were stable as well.  Patient was afebrile and O2 sat was 100% on room air.  Chest x-ray revealed no acute findings.  Reviewed all findings with Dr. Julien Nordmann today.  Of note-patient already sees pulmonologist Dr.  Roxan Hockey.  Dr. Julien Nordmann recommended that patient obtain an ear, nose and throat referral for further evaluation of chronic cough.  Also, will forward today's note and x-ray results to Dr. Roxan Hockey pulmonologist as well.  Patient is scheduled to follow-up with Dr. Roxan Hockey already on 10/20/2015.  Patient was advised to call/return or go directly to the emergency department for any worsening symptoms whatsoever.  Non-small cell cancer of right lung [facility]) Patient received cycle 2 of her cisplatin/Alimta chemotherapy regimen on 10/01/2015.  Patient is scheduled for labs only on 10/15/2015.  She is scheduled to follow-up with her pulmonologist Dr. Roxan Hockey on 10/20/2015.  She is scheduled for labs, visit, and chemotherapy again on 10/22/2015.   Patient stated understanding of all instructions; and was in agreement with this plan of care. The patient knows to call the clinic with any problems, questions or concerns.   Total time spent with patient was 25 minutes;  with greater than 75 percent of that time spent in face to face counseling regarding patient's symptoms,  and coordination of care and follow up.  Disclaimer:This dictation was prepared with Dragon/digital dictation along with [employer]. Any transcriptional errors that result from this process are unintentional.  Drue Second, NP 10/08/2015

## 2015-10-09 ENCOUNTER — Encounter: Payer: Self-pay | Admitting: Internal Medicine

## 2015-10-09 ENCOUNTER — Encounter: Payer: Self-pay | Admitting: Thoracic Surgery (Cardiothoracic Vascular Surgery)

## 2015-10-14 ENCOUNTER — Encounter: Payer: Self-pay | Admitting: Internal Medicine

## 2015-10-14 NOTE — Progress Notes (Signed)
Fax sent 07/09/15 I sent to medical records

## 2015-10-15 ENCOUNTER — Telehealth: Payer: Self-pay | Admitting: *Deleted

## 2015-10-15 ENCOUNTER — Other Ambulatory Visit (HOSPITAL_BASED_OUTPATIENT_CLINIC_OR_DEPARTMENT_OTHER): Payer: BC Managed Care – PPO

## 2015-10-15 DIAGNOSIS — C3490 Malignant neoplasm of unspecified part of unspecified bronchus or lung: Secondary | ICD-10-CM

## 2015-10-15 DIAGNOSIS — C3411 Malignant neoplasm of upper lobe, right bronchus or lung: Secondary | ICD-10-CM | POA: Diagnosis not present

## 2015-10-15 LAB — COMPREHENSIVE METABOLIC PANEL
ALT: 16 U/L (ref 0–55)
AST: 17 U/L (ref 5–34)
Albumin: 3.7 g/dL (ref 3.5–5.0)
Alkaline Phosphatase: 64 U/L (ref 40–150)
Anion Gap: 7 mEq/L (ref 3–11)
BUN: 22.6 mg/dL (ref 7.0–26.0)
CO2: 26 mEq/L (ref 22–29)
Calcium: 9.3 mg/dL (ref 8.4–10.4)
Chloride: 105 mEq/L (ref 98–109)
Creatinine: 0.8 mg/dL (ref 0.6–1.1)
EGFR: 73 mL/min/{1.73_m2} — ABNORMAL LOW (ref >90)
Glucose: 100 mg/dl (ref 70–140)
Potassium: 4.7 mEq/L (ref 3.5–5.1)
Sodium: 139 mEq/L (ref 136–145)
Total Bilirubin: 0.4 mg/dL (ref 0.20–1.20)
Total Protein: 6.1 g/dL — ABNORMAL LOW (ref 6.4–8.3)

## 2015-10-15 LAB — CBC WITH DIFFERENTIAL/PLATELET
BASO%: 0.6 % (ref 0.0–2.0)
Basophils Absolute: 0 10*3/uL (ref 0.0–0.1)
EOS%: 1.6 % (ref 0.0–7.0)
Eosinophils Absolute: 0.1 10*3/uL (ref 0.0–0.5)
HCT: 39.4 % (ref 34.8–46.6)
HGB: 13.2 g/dL (ref 11.6–15.9)
LYMPH%: 20.4 % (ref 14.0–49.7)
MCH: 29.2 pg (ref 25.1–34.0)
MCHC: 33.4 g/dL (ref 31.5–36.0)
MCV: 87.5 fL (ref 79.5–101.0)
MONO#: 0.8 10*3/uL (ref 0.1–0.9)
MONO%: 10.7 % (ref 0.0–14.0)
NEUT#: 4.8 10*3/uL (ref 1.5–6.5)
NEUT%: 66.7 % (ref 38.4–76.8)
Platelets: 168 10*3/uL (ref 145–400)
RBC: 4.51 10*6/uL (ref 3.70–5.45)
RDW: 14.5 % (ref 11.2–14.5)
WBC: 7.1 10*3/uL (ref 3.9–10.3)
lymph#: 1.5 10*3/uL (ref 0.9–3.3)

## 2015-10-15 LAB — MAGNESIUM: Magnesium: 2.2 mg/dl (ref 1.5–2.5)

## 2015-10-15 MED ORDER — AMBULATORY NON FORMULARY MEDICATION: Status: DC

## 2015-10-15 NOTE — Telephone Encounter (Signed)
VM message from patient received @ 8:22 am regarding mouth sores.  TC back to patient and spoke with her.  She states the sores are on the right side of her mouth near the back of her lower teeth. She states they are very tender but not interfering with her eating or drinking at this point. Advised warm saline rinses and possibly 'magic mouthwash'. Pt interested in magic mouthwash as weekend approaches 'just in case'  Pt  does not feel bad overall. No other issues verbalized.

## 2015-10-15 NOTE — Telephone Encounter (Signed)
Pt called lmovm, states " i was told you would be calling in the mouthwash, is there anything else I need." Reviewed with MD, returned call to pt. Instructed pt, per MD to try Biotene also. Pt states " I hate that stuff, i have plenty of it." Encouraged salt water rinses, biotene, magic mouthwash will be sent to pharmacy. No further concerns.

## 2015-10-20 ENCOUNTER — Encounter: Payer: Self-pay | Admitting: Thoracic Surgery (Cardiothoracic Vascular Surgery)

## 2015-10-20 ENCOUNTER — Ambulatory Visit (INDEPENDENT_AMBULATORY_CARE_PROVIDER_SITE_OTHER): Payer: Self-pay | Admitting: Thoracic Surgery (Cardiothoracic Vascular Surgery)

## 2015-10-20 VITALS — BP 107/74 | HR 79 | Resp 16 | Ht 59.0 in | Wt 92.0 lb

## 2015-10-20 DIAGNOSIS — Z902 Acquired absence of lung [part of]: Secondary | ICD-10-CM

## 2015-10-20 DIAGNOSIS — C3411 Malignant neoplasm of upper lobe, right bronchus or lung: Secondary | ICD-10-CM

## 2015-10-20 NOTE — Progress Notes (Signed)
[address]       [place] [address]             [phone]       HPI: Mrs. Marquina returns today for scheduled follow-up visit.  She is a 65 year old nonsmoker who originally presented with left-sided chest pain. As part of her workup she had a chest x-ray which showed a right upper lobe mass. 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 I did a thoracoscopic right upper lobectomy and node dissection on 07/30/2015. Surprisingly she had multiple positive lymph nodes (8 out of 13). Her final stage was IIIA (T2a, N2).  She did well postoperatively and the home on day 5. I last saw her in the office about a month postoperatively she was doing well at that time.  She continues to do well. She has started chemotherapy. She's had 2 of 4 cycles of cisplatin and Alimta. She has tolerated that well so far. She is scheduled to have radiation after she completes chemotherapy. She has some anxiety about that.  She denies any incisional pain. She is exercising on a regular basis and feels she is about back to her preoperative baseline.  Past Medical History  Diagnosis Date  . Arthritis     shoulders  . Ovarian cyst   . Family history of adverse reaction to anesthesia     Patients grandmother died while having hand surgery; pt unsure of cause  . Environmental and seasonal allergies   . Complication of anesthesia     nausea  . PONV (postoperative nausea and vomiting)   . Anxiety   . Cough 09/17/2015      Current Outpatient Prescriptions  Medication Sig Dispense Refill  . acetaminophen (TYLENOL) 500 MG tablet Take 2 tablets (1,000 mg total) by mouth every 6 (six) hours as needed. 30 tablet 0  . AMBULATORY NON FORMULARY MEDICATION Medication Name: MAGIC MOUTHWASH 2 % Viscous Lidocaine  Maalox Benadryl Susp.  Disp: 1:1:1 28m bottle Sig: 171mPO Swish & Spit  q 3-4hrs. PRN mouth sores 200 mL 3  . Azelaic Acid (FINACEA) 15 % cream Apply 1  application topically 2 (two) times daily. After skin is thoroughly washed and patted dry, gently but thoroughly massage a thin film of azelaic acid cream into the affected area twice daily, in the morning and evening.    . Brimonidine Tartrate (MIRVASO) 0.33 % GEL Apply 1 application topically daily.    . cholecalciferol (VITAMIN D) 1000 UNITS tablet Take 1,000 Units by mouth daily.    . citalopram (CELEXA) 10 MG tablet Take 10 mg by mouth every morning.    . Marland Kitchenexamethasone (DECADRON) 4 MG tablet 4 mg by mouth twice a day the day before, day of and day after the chemotherapy every 3 weeks 40 tablet 1  . folic acid (FOLVITE) 1 MG tablet Take 1 tablet (1 mg total) by mouth daily. 30 tablet 4  . loratadine (CLARITIN) 10 MG tablet Take 1 tablet (10 mg total) by mouth daily as needed for allergies.  0  . LORazepam (ATIVAN) 0.5 MG tablet Take 0.5 mg by mouth as needed. Reported on 10/07/2015    . Multiple Vitamin (MULTIVITAMIN WITH MINERALS) TABS Take 1 tablet by mouth daily.    . polyethylene glycol powder (MIRALAX) powder Take 255 g by mouth once. 255 g 0  . prochlorperazine (COMPAZINE) 10 MG tablet Take 1 tablet (10 mg total) by mouth every 6 (six) hours as  needed for nausea or vomiting. 30 tablet 0  . zolpidem (AMBIEN) 5 MG tablet Take 5 mg by mouth at bedtime as needed for sleep. Reported on 10/07/2015     No current facility-administered medications for this visit.    Physical Exam BP 107/74 mmHg  Pulse 79  Resp 16  Ht 4' 11"  (1.499 m)  Wt 92 lb (41.731 kg)  BMI 18.57 kg/m2  SpO39 54% 65 year old woman in no acute distress Well-developed and well-nourished Alert and oriented 3 with no focal deficits Cardiac regular rate and rhythm normal S1 and S2 Lungs diminished right base, otherwise clear  Diagnostic Tests: I personally reviewed her chest x-ray. It shows postoperative changes with no evidence of recurrent disease.  Impression: 65 year old woman who is 3 months out from a  thoracoscopic right upper lobectomy for what turned out to be a stage IIIa (T2, N2) adenocarcinoma. 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  She has done extraordinarily well postoperatively. She also is tolerating chemotherapy well.  She will have radiation after she completes chemotherapy.  She will be followed closely by Dr. Julien Nordmann and Dr. Tammi Klippel. I do want to see her back in 1 year to check on her progress. I will defer follow-up imaging to Dr. Julien Nordmann and Dr. Kerby Nora, MD [facility] [phone]

## 2015-10-22 ENCOUNTER — Telehealth: Payer: Self-pay | Admitting: Internal Medicine

## 2015-10-22 ENCOUNTER — Encounter: Payer: Self-pay | Admitting: Internal Medicine

## 2015-10-22 ENCOUNTER — Telehealth: Payer: Self-pay | Admitting: Medical Oncology

## 2015-10-22 ENCOUNTER — Ambulatory Visit: Payer: BC Managed Care – PPO | Admitting: Nutrition

## 2015-10-22 ENCOUNTER — Ambulatory Visit (HOSPITAL_BASED_OUTPATIENT_CLINIC_OR_DEPARTMENT_OTHER): Payer: BC Managed Care – PPO | Admitting: Internal Medicine

## 2015-10-22 ENCOUNTER — Other Ambulatory Visit (HOSPITAL_BASED_OUTPATIENT_CLINIC_OR_DEPARTMENT_OTHER): Payer: BC Managed Care – PPO

## 2015-10-22 ENCOUNTER — Ambulatory Visit (HOSPITAL_BASED_OUTPATIENT_CLINIC_OR_DEPARTMENT_OTHER): Payer: BC Managed Care – PPO

## 2015-10-22 VITALS — BP 128/69 | HR 82 | Temp 97.8°F | Resp 17 | Ht 59.0 in | Wt 94.2 lb

## 2015-10-22 DIAGNOSIS — C3411 Malignant neoplasm of upper lobe, right bronchus or lung: Secondary | ICD-10-CM

## 2015-10-22 DIAGNOSIS — R05 Cough: Secondary | ICD-10-CM

## 2015-10-22 DIAGNOSIS — B370 Candidal stomatitis: Secondary | ICD-10-CM | POA: Diagnosis not present

## 2015-10-22 DIAGNOSIS — Z5111 Encounter for antineoplastic chemotherapy: Secondary | ICD-10-CM

## 2015-10-22 DIAGNOSIS — C3491 Malignant neoplasm of unspecified part of right bronchus or lung: Secondary | ICD-10-CM

## 2015-10-22 DIAGNOSIS — C3490 Malignant neoplasm of unspecified part of unspecified bronchus or lung: Secondary | ICD-10-CM

## 2015-10-22 HISTORY — DX: Candidal stomatitis: B37.0

## 2015-10-22 HISTORY — DX: Encounter for antineoplastic chemotherapy: Z51.11

## 2015-10-22 LAB — COMPREHENSIVE METABOLIC PANEL
ALT: 15 U/L (ref 0–55)
AST: 18 U/L (ref 5–34)
Albumin: 3.9 g/dL (ref 3.5–5.0)
Alkaline Phosphatase: 68 U/L (ref 40–150)
Anion Gap: 9 mEq/L (ref 3–11)
BUN: 16.3 mg/dL (ref 7.0–26.0)
CO2: 23 mEq/L (ref 22–29)
Calcium: 9.8 mg/dL (ref 8.4–10.4)
Chloride: 105 mEq/L (ref 98–109)
Creatinine: 0.8 mg/dL (ref 0.6–1.1)
EGFR: 75 mL/min/{1.73_m2} — ABNORMAL LOW (ref >90)
Glucose: 121 mg/dl (ref 70–140)
Potassium: 4.2 mEq/L (ref 3.5–5.1)
Sodium: 137 mEq/L (ref 136–145)
Total Bilirubin: 0.42 mg/dL (ref 0.20–1.20)
Total Protein: 6.7 g/dL (ref 6.4–8.3)

## 2015-10-22 LAB — CBC WITH DIFFERENTIAL/PLATELET
BASO%: 0.1 % (ref 0.0–2.0)
Basophils Absolute: 0 10*3/uL (ref 0.0–0.1)
EOS%: 0.3 % (ref 0.0–7.0)
Eosinophils Absolute: 0 10*3/uL (ref 0.0–0.5)
HCT: 39.4 % (ref 34.8–46.6)
HGB: 13.4 g/dL (ref 11.6–15.9)
LYMPH%: 15 % (ref 14.0–49.7)
MCH: 30 pg (ref 25.1–34.0)
MCHC: 34 g/dL (ref 31.5–36.0)
MCV: 88.1 fL (ref 79.5–101.0)
MONO#: 0.8 10*3/uL (ref 0.1–0.9)
MONO%: 7.9 % (ref 0.0–14.0)
NEUT#: 8 10*3/uL — ABNORMAL HIGH (ref 1.5–6.5)
NEUT%: 76.7 % (ref 38.4–76.8)
Platelets: 190 10*3/uL (ref 145–400)
RBC: 4.47 10*6/uL (ref 3.70–5.45)
RDW: 14.6 % — ABNORMAL HIGH (ref 11.2–14.5)
WBC: 10.4 10*3/uL — ABNORMAL HIGH (ref 3.9–10.3)
lymph#: 1.6 10*3/uL (ref 0.9–3.3)

## 2015-10-22 LAB — MAGNESIUM: Magnesium: 2 mg/dl (ref 1.5–2.5)

## 2015-10-22 MED ORDER — CYANOCOBALAMIN 1000 MCG/ML IJ SOLN
INTRAMUSCULAR | Status: AC
  Filled 2015-10-22: qty 1

## 2015-10-22 MED ORDER — SODIUM CHLORIDE 0.9 % IV SOLN
Freq: Once | INTRAVENOUS | Status: AC
  Administered 2015-10-22: 11:00:00 via INTRAVENOUS
  Filled 2015-10-22: qty 5

## 2015-10-22 MED ORDER — FAMOTIDINE IN NACL 20-0.9 MG/50ML-% IV SOLN
INTRAVENOUS | Status: AC
  Filled 2015-10-22: qty 50

## 2015-10-22 MED ORDER — POTASSIUM CHLORIDE 2 MEQ/ML IV SOLN
Freq: Once | INTRAVENOUS | Status: AC
  Administered 2015-10-22: 09:00:00 via INTRAVENOUS
  Filled 2015-10-22: qty 10

## 2015-10-22 MED ORDER — SODIUM CHLORIDE 0.9 % IV SOLN
Freq: Once | INTRAVENOUS | Status: AC
  Administered 2015-10-22: 12:00:00 via INTRAVENOUS

## 2015-10-22 MED ORDER — DIPHENHYDRAMINE HCL 50 MG/ML IJ SOLN
INTRAMUSCULAR | Status: AC
  Filled 2015-10-22: qty 1

## 2015-10-22 MED ORDER — FAMOTIDINE IN NACL 20-0.9 MG/50ML-% IV SOLN
20.0000 mg | Freq: Once | INTRAVENOUS | Status: AC
  Administered 2015-10-22: 20 mg via INTRAVENOUS

## 2015-10-22 MED ORDER — PALONOSETRON HCL INJECTION 0.25 MG/5ML
0.2500 mg | Freq: Once | INTRAVENOUS | Status: AC
  Administered 2015-10-22: 0.25 mg via INTRAVENOUS

## 2015-10-22 MED ORDER — SODIUM CHLORIDE 0.9 % IV SOLN
74.0000 mg/m2 | Freq: Once | INTRAVENOUS | Status: AC
  Administered 2015-10-22: 100 mg via INTRAVENOUS
  Filled 2015-10-22: qty 100

## 2015-10-22 MED ORDER — DIPHENHYDRAMINE HCL 50 MG/ML IJ SOLN
25.0000 mg | Freq: Once | INTRAMUSCULAR | Status: AC
  Administered 2015-10-22: 25 mg via INTRAVENOUS

## 2015-10-22 MED ORDER — NYSTATIN NICU ORAL SYRINGE 100,000 UNITS/ML: 1.0000 mL | Freq: Four times a day (QID) | OROMUCOSAL | Status: DC

## 2015-10-22 MED ORDER — PALONOSETRON HCL INJECTION 0.25 MG/5ML
INTRAVENOUS | Status: AC
  Filled 2015-10-22: qty 5

## 2015-10-22 MED ORDER — SODIUM CHLORIDE 0.9 % IV SOLN
510.0000 mg/m2 | Freq: Once | INTRAVENOUS | Status: AC
  Administered 2015-10-22: 700 mg via INTRAVENOUS
  Filled 2015-10-22: qty 20

## 2015-10-22 MED ORDER — CYANOCOBALAMIN 1000 MCG/ML IJ SOLN
1000.0000 ug | Freq: Once | INTRAMUSCULAR | Status: AC
  Administered 2015-10-22: 1000 ug via INTRAMUSCULAR

## 2015-10-22 NOTE — Telephone Encounter (Signed)
Gave and pritned appt sched and avs for pt for May and June

## 2015-10-22 NOTE — Progress Notes (Signed)
Nutrition follow-up completed with patient during infusion for non-small cell lung cancer. Weight is stable and documented as 94.2 pounds on May 11. Patient denies nutrition impact symptoms. Patient has questions about food safety and potential radiation side effects.  Nutrition diagnosis: Food and nutrition related knowledge deficit continues.  Intervention: Educated patient to continue strategies for adequate calories and protein intake. Reviewed potential of difficulty swallowing with radiation therapy. Provided brief education on some strategies patient could incorporate Questions were answered.  Teach back method used.  Monitoring, evaluation, goals: Patient will tolerate adequate calories and protein to minimize weight loss.  Next visit: Patient will contact me for questions or concerns.  Further visits can be scheduled as needed.  **Disclaimer: This note was dictated with voice recognition software. Similar sounding words can inadvertently be transcribed and this note may contain transcription errors which may not have been corrected upon publication of note.**

## 2015-10-22 NOTE — Telephone Encounter (Signed)
Called in rx

## 2015-10-22 NOTE — Progress Notes (Signed)
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   PRIOR THERAPY: Right VATS with right upper lobectomy with bronchoplasty closure and en bloc resection of a wedge from the superior segment of the right lower lobe in addition to mediastinal lymph node dissection on 07/30/2015 under the care of Dr. Roxan Hockey.  CURRENT THERAPY: Adjuvant systemic chemotherapy with cisplatin 75 MG/M2 and Alimta 500 MG/M2 every 3 weeks. First dose was given on 09/10/2015. Status post 2 cycles.  INTERVAL HISTORY: [name] West 65 y.o. female returns to the clinic today for follow-up visit accompanied by her daughter, Evelena Peat. The patient is doing fine today with no specific complaints except for some oral thrush. She tolerated the second cycle of her systemic chemotherapy fairly well. She continues to have dry cough but no significant chest pain, shortness of breath or hemoptysis. She has no fever or chills. She is here today for evaluation before starting cycle #3.  MEDICAL HISTORY: Past Medical History  Diagnosis Date  . Arthritis     shoulders  . Ovarian cyst   . Family history of adverse reaction to anesthesia     Patients grandmother died while having hand surgery; pt unsure of cause  . Environmental and seasonal allergies   . Complication of anesthesia     nausea  . PONV (postoperative nausea and vomiting)   . Anxiety   . Cough 09/17/2015    ALLERGIES:  is allergic to avelox; cephalosporins; clindamycin/lincomycin; penicillins; macrobid; omnipaque; adhesive; doxycycline; lidoderm; pseudoephedrine; and sulfa  antibiotics.  MEDICATIONS:  Current Outpatient Prescriptions  Medication Sig Dispense Refill  . acetaminophen (TYLENOL) 500 MG tablet Take 2 tablets (1,000 mg total) by mouth every 6 (six) hours as needed. 30 tablet 0  . AMBULATORY NON FORMULARY MEDICATION Medication Name: MAGIC MOUTHWASH 2 % Viscous Lidocaine  Maalox Benadryl Susp.  Disp: 1:1:1 256m bottle Sig: 168mPO Swish & Spit  q 3-4hrs. PRN mouth sores 200 mL 3  . Azelaic Acid (FINACEA) 15 % cream Apply 1 application topically 2 (two) times daily. After skin is thoroughly washed and patted dry, gently but thoroughly massage a thin film of azelaic acid cream into the affected area twice daily, in the morning and evening.    . Brimonidine Tartrate (MIRVASO) 0.33 % GEL Apply 1 application topically daily.    . cholecalciferol (VITAMIN D) 1000 UNITS tablet Take 1,000 Units by mouth daily.    . citalopram (CELEXA) 10 MG tablet Take 10 mg by mouth every morning.    . Marland Kitchenexamethasone (DECADRON) 4 MG tablet 4 mg by mouth twice a day the day before, day of and day after the chemotherapy every 3 weeks 40 tablet 1  . folic acid (FOLVITE) 1 MG tablet Take 1 tablet (1 mg total) by mouth daily. 30 tablet 4  . loratadine (CLARITIN) 10 MG tablet Take 1 tablet (10 mg total) by mouth daily as needed for allergies.  0  . LORazepam (ATIVAN) 0.5 MG tablet Take 0.5 mg by mouth as needed. Reported on 10/07/2015    . Multiple Vitamin (MULTIVITAMIN WITH MINERALS) TABS Take 1 tablet by mouth daily.    . polyethylene glycol powder (MIRALAX)  powder Take 255 g by mouth once. 255 g 0  . prochlorperazine (COMPAZINE) 10 MG tablet Take 1 tablet (10 mg total) by mouth every 6 (six) hours as needed for nausea or vomiting. 30 tablet 0  . zolpidem (AMBIEN) 5 MG tablet Take 5 mg by mouth at bedtime as needed for sleep. Reported on 10/07/2015     No current facility-administered medications for this visit.    SURGICAL HISTORY:  Past Surgical History  Procedure  Laterality Date  . Right rotator cuff Right   . Cesarean section  1984  . Fibroid removed    . Colonoscopy with propofol N/A 09/27/2012    Procedure: COLONOSCOPY WITH PROPOFOL;  Surgeon: Garlan Fair, MD;  Location: [facility];  Service: Endoscopy;  Laterality: N/A;  . Video bronchoscopy with endobronchial navigation N/A 07/15/2015    Procedure: VIDEO BRONCHOSCOPY WITH ENDOBRONCHIAL NAVIGATION with Biopsy;  Surgeon: Collene Gobble, MD;  Location: [facility];  Service: Thoracic;  Laterality: N/A;  . Video assisted thoracoscopy (vats)/ lobectomy Right 07/30/2015    Procedure: RIGHT VIDEO ASSISTED THORACOSCOPY (VATS)/RIGHT UPPER LOBECTOMY;  Surgeon: Melrose Nakayama, MD;  Location: [facility];  Service: Thoracic;  Laterality: Right;    REVIEW OF SYSTEMS:  A comprehensive review of systems was negative except for: Ears, nose, mouth, throat, and face: positive for Oral thrush   PHYSICAL EXAMINATION: General appearance: alert, cooperative and no distress Head: Normocephalic, without obvious abnormality, atraumatic Neck: no adenopathy, no JVD, supple, symmetrical, trachea midline and thyroid not enlarged, symmetric, no tenderness/mass/nodules Lymph nodes: Cervical, supraclavicular, and axillary nodes normal. Resp: clear to auscultation bilaterally Back: symmetric, no curvature. ROM normal. No CVA tenderness. Cardio: regular rate and rhythm, S1, S2 normal, no murmur, click, rub or gallop GI: soft, non-tender; bowel sounds normal; no masses,  no organomegaly Extremities: extremities normal, atraumatic, no cyanosis or edema  ECOG PERFORMANCE STATUS: 1 - Symptomatic but completely ambulatory  There were no vitals taken for this visit.  LABORATORY DATA: Lab Results  Component Value Date   WBC 10.4* 10/22/2015   HGB 13.4 10/22/2015   HCT 39.4 10/22/2015   MCV 88.1 10/22/2015   PLT 190 10/22/2015      Chemistry      Component Value Date/Time   NA 139 10/15/2015 0852   NA 137 08/14/2015 2201    K 4.7 10/15/2015 0852   K 4.0 08/14/2015 2201   CL 97* 08/14/2015 2201   CO2 26 10/15/2015 0852   CO2 26 08/14/2015 2201   BUN 22.6 10/15/2015 0852   BUN 34* 08/14/2015 2201   CREATININE 0.8 10/15/2015 0852   CREATININE 0.75 08/14/2015 2201      Component Value Date/Time   CALCIUM 9.3 10/15/2015 0852   CALCIUM 9.5 08/14/2015 2201   ALKPHOS 64 10/15/2015 0852   ALKPHOS 33* 08/01/2015 0759   AST 17 10/15/2015 0852   AST 18 08/01/2015 0759   ALT 16 10/15/2015 0852   ALT 14 08/01/2015 0759   BILITOT 0.40 10/15/2015 0852   BILITOT 0.4 08/01/2015 0759       RADIOGRAPHIC STUDIES: Dg Chest 2 View  10/07/2015  CLINICAL DATA:  Nonproductive cough since lung cancer surgery in February 2017. EXAM: CHEST  2 VIEW COMPARISON:  08/14/2015 FINDINGS: Evidence of previous right upper lobectomy. Slight tethering of the right hemidiaphragm with slight scarring around the right hilum. Aeration in the right lung has improved since the prior study. Left lung is clear. Heart size and vascularity are normal. No acute bone  abnormality. 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 IMPRESSION: No acute abnormality. Postsurgical scarring in the right hemi thorax. Improved aeration. Electronically Signed   By: Lorriane Shire M.D.   On: 10/07/2015 12:30    ASSESSMENT AND PLAN: This is a very pleasant 65 years old white female recently diagnosed with a stage IIIA non-small cell lung cancer status post right upper lobectomy with lymph node dissection and currently undergoing adjuvant systemic chemotherapy with cisplatin and Alimta status post 2 cycles. She tolerated the second cycle of her treatment well. I recommended for the patient to continue her current treatment with cisplatin and Alimta for cycle #3 today as scheduled. For the oral thrush, I will start the patient on nystatin orally every 6 hours for 7 days. She was advised to call immediately if she has any concerning symptoms in the  interval. The patient voices understanding of current disease status and treatment options and is in agreement with the current care plan.  All questions were answered. The patient knows to call the clinic with any problems, questions or concerns. We can certainly see the patient much sooner if necessary.  Disclaimer: This note was dictated with voice recognition software. Similar sounding words can inadvertently be transcribed and may not be corrected upon review.

## 2015-10-22 NOTE — Patient Instructions (Signed)
[facility] Discharge Instructions for Patients Receiving Chemotherapy  Today you received the following chemotherapy agents:  Cisplatin, Alimta  To help prevent nausea and vomiting after your treatment, we encourage you to take your nausea medication as prescribed.   If you develop nausea and vomiting that is not controlled by your nausea medication, call the clinic.   BELOW ARE SYMPTOMS THAT SHOULD BE REPORTED IMMEDIATELY:  *FEVER GREATER THAN 100.5 F  *CHILLS WITH OR WITHOUT FEVER  NAUSEA AND VOMITING THAT IS NOT CONTROLLED WITH YOUR NAUSEA MEDICATION  *UNUSUAL SHORTNESS OF BREATH  *UNUSUAL BRUISING OR BLEEDING  TENDERNESS IN MOUTH AND THROAT WITH OR WITHOUT PRESENCE OF ULCERS  *URINARY PROBLEMS  *BOWEL PROBLEMS  UNUSUAL RASH Items with * indicate a potential emergency and should be followed up as soon as possible.  Feel free to call the clinic you have any questions or concerns. The clinic phone number is [phone].  Please show the [facility] at check-in to the Emergency Department and triage nurse.

## 2015-10-29 ENCOUNTER — Other Ambulatory Visit (HOSPITAL_BASED_OUTPATIENT_CLINIC_OR_DEPARTMENT_OTHER): Payer: BC Managed Care – PPO

## 2015-10-29 DIAGNOSIS — C3490 Malignant neoplasm of unspecified part of unspecified bronchus or lung: Secondary | ICD-10-CM | POA: Diagnosis not present

## 2015-10-29 LAB — COMPREHENSIVE METABOLIC PANEL
ALT: 21 U/L (ref 0–55)
AST: 19 U/L (ref 5–34)
Albumin: 3.6 g/dL (ref 3.5–5.0)
Alkaline Phosphatase: 68 U/L (ref 40–150)
Anion Gap: 6 mEq/L (ref 3–11)
BUN: 23.3 mg/dL (ref 7.0–26.0)
CO2: 27 mEq/L (ref 22–29)
Calcium: 9.3 mg/dL (ref 8.4–10.4)
Chloride: 103 mEq/L (ref 98–109)
Creatinine: 0.8 mg/dL (ref 0.6–1.1)
EGFR: 80 mL/min/{1.73_m2} — ABNORMAL LOW (ref >90)
Glucose: 94 mg/dl (ref 70–140)
Potassium: 4.8 mEq/L (ref 3.5–5.1)
Sodium: 136 mEq/L (ref 136–145)
Total Bilirubin: <0.3 mg/dL (ref 0.20–1.20)
Total Protein: 6.2 g/dL — ABNORMAL LOW (ref 6.4–8.3)

## 2015-10-29 LAB — CBC WITH DIFFERENTIAL/PLATELET
BASO%: 0.2 % (ref 0.0–2.0)
Basophils Absolute: 0 10*3/uL (ref 0.0–0.1)
EOS%: 2.2 % (ref 0.0–7.0)
Eosinophils Absolute: 0.1 10*3/uL (ref 0.0–0.5)
HCT: 37.7 % (ref 34.8–46.6)
HGB: 13 g/dL (ref 11.6–15.9)
LYMPH%: 29.7 % (ref 14.0–49.7)
MCH: 30 pg (ref 25.1–34.0)
MCHC: 34.5 g/dL (ref 31.5–36.0)
MCV: 87.1 fL (ref 79.5–101.0)
MONO#: 1.2 10*3/uL — ABNORMAL HIGH (ref 0.1–0.9)
MONO%: 19.7 % — ABNORMAL HIGH (ref 0.0–14.0)
NEUT#: 2.8 10*3/uL (ref 1.5–6.5)
NEUT%: 48.2 % (ref 38.4–76.8)
Platelets: 123 10*3/uL — ABNORMAL LOW (ref 145–400)
RBC: 4.33 10*6/uL (ref 3.70–5.45)
RDW: 14.4 % (ref 11.2–14.5)
WBC: 5.8 10*3/uL (ref 3.9–10.3)
lymph#: 1.7 10*3/uL (ref 0.9–3.3)

## 2015-10-29 LAB — MAGNESIUM: Magnesium: 2.3 mg/dl (ref 1.5–2.5)

## 2015-11-02 ENCOUNTER — Encounter: Payer: Self-pay | Admitting: Internal Medicine

## 2015-11-05 ENCOUNTER — Encounter: Payer: Self-pay | Admitting: Nutrition

## 2015-11-05 ENCOUNTER — Other Ambulatory Visit (HOSPITAL_BASED_OUTPATIENT_CLINIC_OR_DEPARTMENT_OTHER): Payer: BC Managed Care – PPO

## 2015-11-05 DIAGNOSIS — C3490 Malignant neoplasm of unspecified part of unspecified bronchus or lung: Secondary | ICD-10-CM

## 2015-11-05 LAB — COMPREHENSIVE METABOLIC PANEL
ALT: 16 U/L (ref 0–55)
AST: 19 U/L (ref 5–34)
Albumin: 3.8 g/dL (ref 3.5–5.0)
Alkaline Phosphatase: 57 U/L (ref 40–150)
Anion Gap: 7 mEq/L (ref 3–11)
BUN: 22.4 mg/dL (ref 7.0–26.0)
CO2: 28 mEq/L (ref 22–29)
Calcium: 9.6 mg/dL (ref 8.4–10.4)
Chloride: 105 mEq/L (ref 98–109)
Creatinine: 0.9 mg/dL (ref 0.6–1.1)
EGFR: 70 mL/min/{1.73_m2} — ABNORMAL LOW (ref >90)
Glucose: 83 mg/dl (ref 70–140)
Potassium: 5 mEq/L (ref 3.5–5.1)
Sodium: 139 mEq/L (ref 136–145)
Total Bilirubin: 0.38 mg/dL (ref 0.20–1.20)
Total Protein: 6.5 g/dL (ref 6.4–8.3)

## 2015-11-05 LAB — CBC WITH DIFFERENTIAL/PLATELET
BASO%: 0.4 % (ref 0.0–2.0)
Basophils Absolute: 0 10*3/uL (ref 0.0–0.1)
EOS%: 1 % (ref 0.0–7.0)
Eosinophils Absolute: 0.1 10*3/uL (ref 0.0–0.5)
HCT: 39.5 % (ref 34.8–46.6)
HGB: 13.3 g/dL (ref 11.6–15.9)
LYMPH%: 20.3 % (ref 14.0–49.7)
MCH: 29.7 pg (ref 25.1–34.0)
MCHC: 33.6 g/dL (ref 31.5–36.0)
MCV: 88.5 fL (ref 79.5–101.0)
MONO#: 0.8 10*3/uL (ref 0.1–0.9)
MONO%: 10.8 % (ref 0.0–14.0)
NEUT#: 4.9 10*3/uL (ref 1.5–6.5)
NEUT%: 67.5 % (ref 38.4–76.8)
Platelets: 167 10*3/uL (ref 145–400)
RBC: 4.46 10*6/uL (ref 3.70–5.45)
RDW: 15.6 % — ABNORMAL HIGH (ref 11.2–14.5)
WBC: 7.3 10*3/uL (ref 3.9–10.3)
lymph#: 1.5 10*3/uL (ref 0.9–3.3)

## 2015-11-05 LAB — MAGNESIUM: Magnesium: 2.4 mg/dl (ref 1.5–2.5)

## 2015-11-05 NOTE — Progress Notes (Signed)
Received email from patient:  Do you know if it ok for me to use "Throat Coat" Tea?  It might help with my cough, but I know it has bark and other odd herbal ingredients. I heard about it from another Chemo patient.  I have done a brief review of the literature.  Ingredients in this tea can increase your blood pressure, make you hypokalemic (low in potassium), and cause strain on your liver/kidneys.  Ingredients like slippery elm bark and marshmallow (often found in "throat coat" teas) are particularly soothing but can interfere with the stomach's ability to absorb medications.  Recommend tea with honey instead. Patient is appreciative of information.

## 2015-11-12 ENCOUNTER — Ambulatory Visit (HOSPITAL_BASED_OUTPATIENT_CLINIC_OR_DEPARTMENT_OTHER): Payer: BC Managed Care – PPO | Admitting: Internal Medicine

## 2015-11-12 ENCOUNTER — Ambulatory Visit: Payer: BC Managed Care – PPO | Admitting: Nutrition

## 2015-11-12 ENCOUNTER — Encounter: Payer: Self-pay | Admitting: Internal Medicine

## 2015-11-12 ENCOUNTER — Ambulatory Visit (HOSPITAL_BASED_OUTPATIENT_CLINIC_OR_DEPARTMENT_OTHER): Payer: BC Managed Care – PPO

## 2015-11-12 ENCOUNTER — Encounter: Payer: BC Managed Care – PPO | Admitting: Nutrition

## 2015-11-12 ENCOUNTER — Encounter: Payer: Self-pay | Admitting: *Deleted

## 2015-11-12 ENCOUNTER — Other Ambulatory Visit: Payer: Self-pay | Admitting: Family Medicine

## 2015-11-12 ENCOUNTER — Telehealth: Payer: Self-pay | Admitting: Internal Medicine

## 2015-11-12 ENCOUNTER — Other Ambulatory Visit (HOSPITAL_BASED_OUTPATIENT_CLINIC_OR_DEPARTMENT_OTHER): Payer: BC Managed Care – PPO

## 2015-11-12 VITALS — BP 136/79 | HR 81 | Temp 97.6°F | Resp 17 | Ht 59.0 in | Wt 94.4 lb

## 2015-11-12 VITALS — BP 115/59 | HR 82 | Resp 18

## 2015-11-12 DIAGNOSIS — C3411 Malignant neoplasm of upper lobe, right bronchus or lung: Secondary | ICD-10-CM

## 2015-11-12 DIAGNOSIS — R05 Cough: Secondary | ICD-10-CM | POA: Diagnosis not present

## 2015-11-12 DIAGNOSIS — C3490 Malignant neoplasm of unspecified part of unspecified bronchus or lung: Secondary | ICD-10-CM

## 2015-11-12 DIAGNOSIS — C3491 Malignant neoplasm of unspecified part of right bronchus or lung: Secondary | ICD-10-CM

## 2015-11-12 DIAGNOSIS — Z1231 Encounter for screening mammogram for malignant neoplasm of breast: Secondary | ICD-10-CM

## 2015-11-12 DIAGNOSIS — Z5111 Encounter for antineoplastic chemotherapy: Secondary | ICD-10-CM

## 2015-11-12 LAB — CBC WITH DIFFERENTIAL/PLATELET
BASO%: 0.3 % (ref 0.0–2.0)
Basophils Absolute: 0 10*3/uL (ref 0.0–0.1)
EOS%: 0 % (ref 0.0–7.0)
Eosinophils Absolute: 0 10*3/uL (ref 0.0–0.5)
HCT: 38.1 % (ref 34.8–46.6)
HGB: 13.1 g/dL (ref 11.6–15.9)
LYMPH%: 12 % — ABNORMAL LOW (ref 14.0–49.7)
MCH: 30.3 pg (ref 25.1–34.0)
MCHC: 34.5 g/dL (ref 31.5–36.0)
MCV: 87.6 fL (ref 79.5–101.0)
MONO#: 0.4 10*3/uL (ref 0.1–0.9)
MONO%: 5.5 % (ref 0.0–14.0)
NEUT#: 6.7 10*3/uL — ABNORMAL HIGH (ref 1.5–6.5)
NEUT%: 82.2 % — ABNORMAL HIGH (ref 38.4–76.8)
Platelets: 219 10*3/uL (ref 145–400)
RBC: 4.34 10*6/uL (ref 3.70–5.45)
RDW: 16.1 % — ABNORMAL HIGH (ref 11.2–14.5)
WBC: 8.1 10*3/uL (ref 3.9–10.3)
lymph#: 1 10*3/uL (ref 0.9–3.3)

## 2015-11-12 LAB — COMPREHENSIVE METABOLIC PANEL
ALT: 12 U/L (ref 0–55)
AST: 17 U/L (ref 5–34)
Albumin: 4 g/dL (ref 3.5–5.0)
Alkaline Phosphatase: 60 U/L (ref 40–150)
Anion Gap: 8 mEq/L (ref 3–11)
BUN: 17.1 mg/dL (ref 7.0–26.0)
CO2: 24 mEq/L (ref 22–29)
Calcium: 9.6 mg/dL (ref 8.4–10.4)
Chloride: 104 mEq/L (ref 98–109)
Creatinine: 0.8 mg/dL (ref 0.6–1.1)
EGFR: 74 mL/min/{1.73_m2} — ABNORMAL LOW (ref >90)
Glucose: 116 mg/dl (ref 70–140)
Potassium: 4.2 mEq/L (ref 3.5–5.1)
Sodium: 136 mEq/L (ref 136–145)
Total Bilirubin: 0.44 mg/dL (ref 0.20–1.20)
Total Protein: 6.7 g/dL (ref 6.4–8.3)

## 2015-11-12 LAB — MAGNESIUM: Magnesium: 2.2 mg/dl (ref 1.5–2.5)

## 2015-11-12 MED ORDER — SODIUM CHLORIDE 0.9 % IV SOLN
Freq: Once | INTRAVENOUS | Status: AC
  Administered 2015-11-12: 11:00:00 via INTRAVENOUS
  Filled 2015-11-12: qty 5

## 2015-11-12 MED ORDER — DIPHENHYDRAMINE HCL 50 MG/ML IJ SOLN
INTRAMUSCULAR | Status: AC
  Filled 2015-11-12: qty 1

## 2015-11-12 MED ORDER — SODIUM CHLORIDE 0.9 % IV SOLN
Freq: Once | INTRAVENOUS | Status: AC
  Administered 2015-11-12: 11:00:00 via INTRAVENOUS

## 2015-11-12 MED ORDER — POTASSIUM CHLORIDE 2 MEQ/ML IV SOLN
Freq: Once | INTRAVENOUS | Status: AC
  Administered 2015-11-12: 10:00:00 via INTRAVENOUS
  Filled 2015-11-12: qty 10

## 2015-11-12 MED ORDER — DIPHENHYDRAMINE HCL 50 MG/ML IJ SOLN
25.0000 mg | Freq: Once | INTRAMUSCULAR | Status: AC
  Administered 2015-11-12: 25 mg via INTRAVENOUS

## 2015-11-12 MED ORDER — CISPLATIN CHEMO INJECTION 100MG/100ML
74.0000 mg/m2 | Freq: Once | INTRAVENOUS | Status: AC
  Administered 2015-11-12: 100 mg via INTRAVENOUS
  Filled 2015-11-12: qty 100

## 2015-11-12 MED ORDER — SODIUM CHLORIDE 0.9 % IV SOLN
510.0000 mg/m2 | Freq: Once | INTRAVENOUS | Status: AC
  Administered 2015-11-12: 700 mg via INTRAVENOUS
  Filled 2015-11-12: qty 24

## 2015-11-12 MED ORDER — FAMOTIDINE IN NACL 20-0.9 MG/50ML-% IV SOLN
20.0000 mg | Freq: Once | INTRAVENOUS | Status: AC
  Administered 2015-11-12: 20 mg via INTRAVENOUS

## 2015-11-12 MED ORDER — FAMOTIDINE IN NACL 20-0.9 MG/50ML-% IV SOLN
INTRAVENOUS | Status: AC
  Filled 2015-11-12: qty 50

## 2015-11-12 MED ORDER — PALONOSETRON HCL INJECTION 0.25 MG/5ML
0.2500 mg | Freq: Once | INTRAVENOUS | Status: AC
  Administered 2015-11-12: 0.25 mg via INTRAVENOUS

## 2015-11-12 NOTE — Progress Notes (Signed)
Oncology Nurse Navigator Documentation  Oncology Nurse Navigator Flowsheets 11/12/2015  Navigator Location [facility]  Navigator Encounter Type -  Telephone -  Abnormal Finding Date -  Confirmed Diagnosis Date -  Surgery Date -  Treatment Initiated Date -  Patient Visit Type MedOnc  Treatment Phase Final Chemo TX  Barriers/Navigation Needs Coordination of Care  Education -  Interventions Coordination of Care  Coordination of Care Appts  Education Method -  Acuity Level 1  Acuity Level 1 Minimal follow up required  Acuity Level 2 -  Time Spent with Patient 4   Spoke with patient and husband today.  She will complete her 4th cycle of chemotherapy.  I notified Dr.

## 2015-11-12 NOTE — Telephone Encounter (Signed)
per pof to sch pt appt-gave pt copy of avs °

## 2015-11-12 NOTE — Progress Notes (Signed)
Patient complains of IV being painful and burning after Alimta finished. No blood return noted. Patient's skin cool to touch, no redness or swelling noted at the site. IV removed. New IV started proximally to previous IV site. Positive blood return noted. Patient instructed to notify nursing if pain or any other concerning symptoms are present. Patient verbalized understanding.

## 2015-11-12 NOTE — Progress Notes (Signed)
Nutrition follow-up completed with patient during infusion for non-small cell lung cancer. Weight is stable and documented as 94.4 pounds on June 1. Patient denies nutrition impact symptoms. Patient continues to have questions about food safety and potential radiation side effects.  Nutrition diagnosis:  Food and nutrition related knowledge deficit continues.  Intervention:  Provided additional information for patient on food safety and difficulty swallowing. Encouraged patient to consider smoothies and shakes if her throat becomes painful and she has swallowing difficulty. Questions were answered.  Teach back method was used.  Monitoring, evaluation, goals: Patient will tolerate adequate calories and protein to minimize weight loss.  Next visit: Patient will contact me to schedule follow-up as needed.  **Disclaimer: This note was dictated with voice recognition software. Similar sounding words can inadvertently be transcribed and this note may contain transcription errors which may not have been corrected upon publication of note.**

## 2015-11-12 NOTE — Progress Notes (Signed)
I notified Dr. Johny Shears nurse and scheduler to schedule patient for CT [facility]

## 2015-11-12 NOTE — Progress Notes (Signed)
[place] Telephone:(336) [phone]   Fax:(336) [place], MD [place] [address]  DIAGNOSIS: Stage IIIA (T2a, N2, M0) non-small cell lung cancer, adenocarcinoma with positive EGFR mutation with deletion in exon 19, presented with right upper lobe lung mass in addition to mediastinal lymphadenopathy status post surgical resection in February 2017.   PRIOR THERAPY: Right VATS with right upper lobectomy with bronchoplasty closure and en bloc resection of a wedge from the superior segment of the right lower lobe in addition to mediastinal lymph node dissection on 07/30/2015 under the care of Dr. Roxan Hockey.  CURRENT THERAPY: Adjuvant systemic chemotherapy with cisplatin 75 MG/M2 and Alimta 500 MG/M2 every 3 weeks. First dose was given on 09/10/2015. Status post 3 cycles.  INTERVAL HISTORY: [name] West 65 y.o. female returns to the clinic today for follow-up visit accompanied by her husband. The patient is doing fine today with no specific complaints. She tolerated the third cycle of her systemic chemotherapy much better. She continues to have dry cough but no significant chest pain, shortness of breath or hemoptysis. She has no fever or chills. She is here today for evaluation before starting cycle #4.  MEDICAL HISTORY: Past Medical History  Diagnosis Date  . Arthritis     shoulders  . Ovarian cyst   . Family history of adverse reaction to anesthesia     Patients grandmother died while having hand surgery; pt unsure of cause  . Environmental and seasonal allergies   . Complication of anesthesia     nausea  . PONV (postoperative nausea and vomiting)   . Anxiety   . Cough 09/17/2015  . Oral thrush 10/22/2015  . Encounter for antineoplastic chemotherapy 10/22/2015    ALLERGIES:  is allergic to avelox; cephalosporins; clindamycin/lincomycin; penicillins; macrobid; omnipaque; adhesive; doxycycline; lidoderm;  pseudoephedrine; and sulfa antibiotics.  MEDICATIONS:  Current Outpatient Prescriptions  Medication Sig Dispense Refill  . acetaminophen (TYLENOL) 500 MG tablet Take 2 tablets (1,000 mg total) by mouth every 6 (six) hours as needed. 30 tablet 0  . cholecalciferol (VITAMIN D) 1000 UNITS tablet Take 1,000 Units by mouth daily.    . citalopram (CELEXA) 10 MG tablet Take 10 mg by mouth every morning.    . folic acid (FOLVITE) 1 MG tablet Take 1 tablet (1 mg total) by mouth daily. 30 tablet 4  . loratadine (CLARITIN) 10 MG tablet Take 1 tablet (10 mg total) by mouth daily as needed for allergies.  0  . Multiple Vitamin (MULTIVITAMIN WITH MINERALS) TABS Take 1 tablet by mouth daily.    . polyethylene glycol powder (MIRALAX) powder Take 255 g by mouth once. 255 g 0  . AMBULATORY NON FORMULARY MEDICATION Medication Name: MAGIC MOUTHWASH 2 % Viscous Lidocaine  Maalox Benadryl Susp.  Disp: 1:1:1 265m bottle Sig: 168mPO Swish & Spit  q 3-4hrs. PRN mouth sores 200 mL 3  . Azelaic Acid (FINACEA) 15 % cream Apply 1 application topically 2 (two) times daily. After skin is thoroughly washed and patted dry, gently but thoroughly massage a thin film of azelaic acid cream into the affected area twice daily, in the morning and evening.    . Brimonidine Tartrate (MIRVASO) 0.33 % GEL Apply 1 application topically daily.    . Marland Kitchenexamethasone (DECADRON) 4 MG tablet 4 mg by mouth twice a day the day before, day of and day after the chemotherapy every 3 weeks 40 tablet 1  . LORazepam (  ATIVAN) 0.5 MG tablet Take 0.5 mg by mouth as needed. Reported on 10/07/2015    . nystatin (MYCOSTATIN) 100000 UNITS/ML SUSP Take 1 mL by mouth every 6 (six) hours. 60 mL 0  . prochlorperazine (COMPAZINE) 10 MG tablet Take 1 tablet (10 mg total) by mouth every 6 (six) hours as needed for nausea or vomiting. 30 tablet 0  . zolpidem (AMBIEN) 5 MG tablet Take 5 mg by mouth at bedtime as needed for sleep. Reported on 11/12/2015     No  current facility-administered medications for this visit.    SURGICAL HISTORY:  Past Surgical History  Procedure Laterality Date  . Right rotator cuff Right   . Cesarean section  1984  . Fibroid removed    . Colonoscopy with propofol N/A 09/27/2012    Procedure: COLONOSCOPY WITH PROPOFOL;  Surgeon: Garlan Fair, MD;  Location: [facility];  Service: Endoscopy;  Laterality: N/A;  . Video bronchoscopy with endobronchial navigation N/A 07/15/2015    Procedure: VIDEO BRONCHOSCOPY WITH ENDOBRONCHIAL NAVIGATION with Biopsy;  Surgeon: Collene Gobble, MD;  Location: [facility];  Service: Thoracic;  Laterality: N/A;  . Video assisted thoracoscopy (vats)/ lobectomy Right 07/30/2015    Procedure: RIGHT VIDEO ASSISTED THORACOSCOPY (VATS)/RIGHT UPPER LOBECTOMY;  Surgeon: Melrose Nakayama, MD;  Location: [facility];  Service: Thoracic;  Laterality: Right;    REVIEW OF SYSTEMS:  A comprehensive review of systems was negative.   PHYSICAL EXAMINATION: General appearance: alert, cooperative and no distress Head: Normocephalic, without obvious abnormality, atraumatic Neck: no adenopathy, no JVD, supple, symmetrical, trachea midline and thyroid not enlarged, symmetric, no tenderness/mass/nodules Lymph nodes: Cervical, supraclavicular, and axillary nodes normal. Resp: clear to auscultation bilaterally Back: symmetric, no curvature. ROM normal. No CVA tenderness. Cardio: regular rate and rhythm, S1, S2 normal, no murmur, click, rub or gallop GI: soft, non-tender; bowel sounds normal; no masses,  no organomegaly Extremities: extremities normal, atraumatic, no cyanosis or edema  ECOG PERFORMANCE STATUS: 1 - Symptomatic but completely ambulatory  Blood pressure 136/79, pulse 81, temperature 97.6 F (36.4 C), temperature source Oral, resp. rate 17, height 4' 11"  (1.499 m), weight 94 lb 6.4 oz (42.82 kg), SpO2 98 %.  LABORATORY DATA: Lab Results  Component Value Date   WBC 8.1 11/12/2015   HGB 13.1 11/12/2015     HCT 38.1 11/12/2015   MCV 87.6 11/12/2015   PLT 219 11/12/2015      Chemistry      Component Value Date/Time   NA 139 11/05/2015 0935   NA 137 08/14/2015 2201   K 5.0 11/05/2015 0935   K 4.0 08/14/2015 2201   CL 97* 08/14/2015 2201   CO2 28 11/05/2015 0935   CO2 26 08/14/2015 2201   BUN 22.4 11/05/2015 0935   BUN 34* 08/14/2015 2201   CREATININE 0.9 11/05/2015 0935   CREATININE 0.75 08/14/2015 2201      Component Value Date/Time   CALCIUM 9.6 11/05/2015 0935   CALCIUM 9.5 08/14/2015 2201   ALKPHOS 57 11/05/2015 0935   ALKPHOS 33* 08/01/2015 0759   AST 19 11/05/2015 0935   AST 18 08/01/2015 0759   ALT 16 11/05/2015 0935   ALT 14 08/01/2015 0759   BILITOT 0.38 11/05/2015 0935   BILITOT 0.4 08/01/2015 0759       RADIOGRAPHIC STUDIES: No results found.  ASSESSMENT AND PLAN: This is a very pleasant 65 years old white female recently diagnosed with a stage IIIA non-small cell lung cancer status post right upper lobectomy with lymph  node dissection and currently undergoing adjuvant systemic chemotherapy with cisplatin and Alimta status post 3 cycles. She tolerated the third cycle of her treatment well. I recommended for the patient to continue her current treatment with cisplatin and Alimta for cycle #4 today as scheduled. I will see her back for follow-up visit in 3 weeks for evaluation after repeating CT scan of the chest for restaging of her disease. The patient would also see Dr. Tammi Klippel for discussion of adjuvant radiotherapy in the next 2 weeks. She was advised to call immediately if she has any concerning symptoms in the interval. The patient voices understanding of current disease status and treatment options and is in agreement with the current care plan.  All questions were answered. The patient knows to call the clinic with any problems, questions or concerns. We can certainly see the patient much sooner if necessary.  Disclaimer: This note was dictated with  voice recognition software. Similar sounding words can inadvertently be transcribed and may not be corrected upon review.

## 2015-11-12 NOTE — Patient Instructions (Signed)
[facility] Discharge Instructions for Patients Receiving Chemotherapy  Today you received the following chemotherapy agents:  Cisplatin, Alimta  To help prevent nausea and vomiting after your treatment, we encourage you to take your nausea medication as prescribed.   If you develop nausea and vomiting that is not controlled by your nausea medication, call the clinic.   BELOW ARE SYMPTOMS THAT SHOULD BE REPORTED IMMEDIATELY:  *FEVER GREATER THAN 100.5 F  *CHILLS WITH OR WITHOUT FEVER  NAUSEA AND VOMITING THAT IS NOT CONTROLLED WITH YOUR NAUSEA MEDICATION  *UNUSUAL SHORTNESS OF BREATH  *UNUSUAL BRUISING OR BLEEDING  TENDERNESS IN MOUTH AND THROAT WITH OR WITHOUT PRESENCE OF ULCERS  *URINARY PROBLEMS  *BOWEL PROBLEMS  UNUSUAL RASH Items with * indicate a potential emergency and should be followed up as soon as possible.  Feel free to call the clinic you have any questions or concerns. The clinic phone number is [phone].  Please show the [facility] at check-in to the Emergency Department and triage nurse.

## 2015-11-13 ENCOUNTER — Telehealth: Payer: Self-pay | Admitting: Radiation Oncology

## 2015-11-13 ENCOUNTER — Telehealth: Payer: Self-pay | Admitting: Medical Oncology

## 2015-11-13 ENCOUNTER — Other Ambulatory Visit: Payer: Self-pay | Admitting: Internal Medicine

## 2015-11-13 DIAGNOSIS — C3491 Malignant neoplasm of unspecified part of right bronchus or lung: Secondary | ICD-10-CM

## 2015-11-13 MED ORDER — PREDNISONE 10 MG (21) PO TBPK: 10.0000 mg | ORAL_TABLET | Freq: Every day | ORAL | Status: DC

## 2015-11-13 MED ORDER — DIPHENHYDRAMINE HCL 25 MG PO TABS: 12.5000 mg | ORAL_TABLET | Freq: Once | ORAL | Status: DC

## 2015-11-13 NOTE — Telephone Encounter (Signed)
Requests premed for Ct dye allergy - note to [facility]. Requests benadryl 12.5 due to wt.

## 2015-11-13 NOTE — Telephone Encounter (Signed)
I spoke with the patient to move forward with radiation treatment planning. Dr. Tammi Klippel is ok with moving forward with XRT planning next week and treatment 2 weeks after she finishes chemo. D/w Pt and sim arranged for next Thursday at [facility]. Her med onc labs were moved to 830 to accommodate for this

## 2015-11-17 ENCOUNTER — Telehealth: Payer: Self-pay | Admitting: Medical Oncology

## 2015-11-17 NOTE — Telephone Encounter (Signed)
reviewed premeds for Ct scan with pt.

## 2015-11-19 ENCOUNTER — Ambulatory Visit
Admission: RE | Admit: 2015-11-19 | Discharge: 2015-11-19 | Disposition: A | Discharge status: Home / Self Care | Payer: BC Managed Care – PPO | Source: Ambulatory Visit | Attending: Radiation Oncology | Admitting: Radiation Oncology

## 2015-11-19 ENCOUNTER — Other Ambulatory Visit (HOSPITAL_BASED_OUTPATIENT_CLINIC_OR_DEPARTMENT_OTHER): Payer: BC Managed Care – PPO

## 2015-11-19 DIAGNOSIS — C3491 Malignant neoplasm of unspecified part of right bronchus or lung: Secondary | ICD-10-CM | POA: Diagnosis present

## 2015-11-19 DIAGNOSIS — Z510 Encounter for antineoplastic radiation therapy: Secondary | ICD-10-CM | POA: Diagnosis not present

## 2015-11-19 DIAGNOSIS — C3411 Malignant neoplasm of upper lobe, right bronchus or lung: Secondary | ICD-10-CM | POA: Diagnosis not present

## 2015-11-19 DIAGNOSIS — C3490 Malignant neoplasm of unspecified part of unspecified bronchus or lung: Secondary | ICD-10-CM

## 2015-11-19 LAB — COMPREHENSIVE METABOLIC PANEL
ALT: 23 U/L (ref 0–55)
AST: 21 U/L (ref 5–34)
Albumin: 3.8 g/dL (ref 3.5–5.0)
Alkaline Phosphatase: 59 U/L (ref 40–150)
Anion Gap: 8 mEq/L (ref 3–11)
BUN: 22.5 mg/dL (ref 7.0–26.0)
CO2: 28 mEq/L (ref 22–29)
Calcium: 9.4 mg/dL (ref 8.4–10.4)
Chloride: 102 mEq/L (ref 98–109)
Creatinine: 0.8 mg/dL (ref 0.6–1.1)
EGFR: 72 mL/min/{1.73_m2} — ABNORMAL LOW (ref >90)
Glucose: 85 mg/dl (ref 70–140)
Potassium: 4.8 mEq/L (ref 3.5–5.1)
Sodium: 137 mEq/L (ref 136–145)
Total Bilirubin: <0.3 mg/dL (ref 0.20–1.20)
Total Protein: 6.5 g/dL (ref 6.4–8.3)

## 2015-11-19 LAB — CBC WITH DIFFERENTIAL/PLATELET
BASO%: 0.4 % (ref 0.0–2.0)
Basophils Absolute: 0 10*3/uL (ref 0.0–0.1)
EOS%: 1.5 % (ref 0.0–7.0)
Eosinophils Absolute: 0.1 10*3/uL (ref 0.0–0.5)
HCT: 38.2 % (ref 34.8–46.6)
HGB: 12.9 g/dL (ref 11.6–15.9)
LYMPH%: 31.5 % (ref 14.0–49.7)
MCH: 30.1 pg (ref 25.1–34.0)
MCHC: 33.8 g/dL (ref 31.5–36.0)
MCV: 89.1 fL (ref 79.5–101.0)
MONO#: 0.8 10*3/uL (ref 0.1–0.9)
MONO%: 15.5 % — ABNORMAL HIGH (ref 0.0–14.0)
NEUT#: 2.6 10*3/uL (ref 1.5–6.5)
NEUT%: 51.1 % (ref 38.4–76.8)
Platelets: 171 10*3/uL (ref 145–400)
RBC: 4.29 10*6/uL (ref 3.70–5.45)
RDW: 16.3 % — ABNORMAL HIGH (ref 11.2–14.5)
WBC: 5 10*3/uL (ref 3.9–10.3)
lymph#: 1.6 10*3/uL (ref 0.9–3.3)

## 2015-11-19 LAB — MAGNESIUM: Magnesium: 2 mg/dl (ref 1.5–2.5)

## 2015-11-19 NOTE — Progress Notes (Signed)
  Radiation Oncology         [phone]) [phone] ________________________________  Name: Safari Cinque MRN: [id number]  Date: 11/19/2015  DOB: Mar 11, 1951  SIMULATION AND TREATMENT PLANNING NOTE    ICD-9-CM ICD-10-CM   1. Non-small cell cancer of right lung (HCC) 162.9 C34.91     DIAGNOSIS:  65 yo woman with stage IIIA non-small cell lung cancer of the right upper lobe  NARRATIVE:  The patient was brought to the [facility].  Identity was confirmed.  All relevant records and images related to the planned course of therapy were reviewed.  The patient freely provided informed written consent to proceed with treatment after reviewing the details related to the planned course of therapy. The consent form was witnessed and verified by the simulation staff.  Then, the patient was set-up in a stable reproducible  supine position for radiation therapy.  CT images were obtained.  Surface markings were placed.  The CT images were loaded into the planning software.  Then the target and avoidance structures were contoured.  Treatment planning then occurred.  The radiation prescription was entered and confirmed.  Then, I designed and supervised the construction of a total of 6 medically necessary complex treatment devices as bodyfix and 2 MLC.  I have requested : 3D Simulation  I have requested a DVH of the following structures: Left Lung, Right Lung, spinal cord, tumor and esophagus.  I have ordered:Nutrition Consult and CBC  PLAN:  The patient will receive 50.4 Gy in 28 fractions.  ________________________________  Sheral Apley Tammi Klippel, M.D.

## 2015-11-24 DIAGNOSIS — Z510 Encounter for antineoplastic radiation therapy: Secondary | ICD-10-CM | POA: Diagnosis not present

## 2015-11-25 ENCOUNTER — Ambulatory Visit (HOSPITAL_COMMUNITY)
Admission: RE | Admit: 2015-11-25 | Discharge: 2015-11-25 | Disposition: A | Discharge status: Home / Self Care | Payer: BC Managed Care – PPO | Source: Ambulatory Visit | Attending: Internal Medicine | Admitting: Internal Medicine

## 2015-11-25 ENCOUNTER — Encounter (HOSPITAL_COMMUNITY): Payer: Self-pay

## 2015-11-25 DIAGNOSIS — Z5111 Encounter for antineoplastic chemotherapy: Secondary | ICD-10-CM

## 2015-11-25 DIAGNOSIS — Z9221 Personal history of antineoplastic chemotherapy: Secondary | ICD-10-CM | POA: Diagnosis not present

## 2015-11-25 DIAGNOSIS — R918 Other nonspecific abnormal finding of lung field: Secondary | ICD-10-CM | POA: Diagnosis not present

## 2015-11-25 DIAGNOSIS — Z902 Acquired absence of lung [part of]: Secondary | ICD-10-CM | POA: Insufficient documentation

## 2015-11-25 DIAGNOSIS — C3491 Malignant neoplasm of unspecified part of right bronchus or lung: Secondary | ICD-10-CM | POA: Diagnosis not present

## 2015-11-25 MED ORDER — IOPAMIDOL (ISOVUE-300) INJECTION 61%
75.0000 mL | Freq: Once | INTRAVENOUS | Status: AC | PRN
  Administered 2015-11-25: 75 mL via INTRAVENOUS

## 2015-11-26 ENCOUNTER — Other Ambulatory Visit (HOSPITAL_BASED_OUTPATIENT_CLINIC_OR_DEPARTMENT_OTHER): Payer: BC Managed Care – PPO

## 2015-11-26 DIAGNOSIS — C3490 Malignant neoplasm of unspecified part of unspecified bronchus or lung: Secondary | ICD-10-CM

## 2015-11-26 DIAGNOSIS — C3411 Malignant neoplasm of upper lobe, right bronchus or lung: Secondary | ICD-10-CM

## 2015-11-26 LAB — CBC WITH DIFFERENTIAL/PLATELET
BASO%: 0.4 % (ref 0.0–2.0)
Basophils Absolute: 0 10*3/uL (ref 0.0–0.1)
EOS%: 0.2 % (ref 0.0–7.0)
Eosinophils Absolute: 0 10*3/uL (ref 0.0–0.5)
HCT: 36.3 % (ref 34.8–46.6)
HGB: 12.5 g/dL (ref 11.6–15.9)
LYMPH%: 23.5 % (ref 14.0–49.7)
MCH: 31 pg (ref 25.1–34.0)
MCHC: 34.4 g/dL (ref 31.5–36.0)
MCV: 90 fL (ref 79.5–101.0)
MONO#: 0.9 10*3/uL (ref 0.1–0.9)
MONO%: 9 % (ref 0.0–14.0)
NEUT#: 6.7 10*3/uL — ABNORMAL HIGH (ref 1.5–6.5)
NEUT%: 66.9 % (ref 38.4–76.8)
Platelets: 198 10*3/uL (ref 145–400)
RBC: 4.03 10*6/uL (ref 3.70–5.45)
RDW: 16.4 % — ABNORMAL HIGH (ref 11.2–14.5)
WBC: 10 10*3/uL (ref 3.9–10.3)
lymph#: 2.4 10*3/uL (ref 0.9–3.3)

## 2015-11-26 LAB — COMPREHENSIVE METABOLIC PANEL
ALT: 16 U/L (ref 0–55)
AST: 18 U/L (ref 5–34)
Albumin: 3.7 g/dL (ref 3.5–5.0)
Alkaline Phosphatase: 52 U/L (ref 40–150)
Anion Gap: 6 mEq/L (ref 3–11)
BUN: 23.6 mg/dL (ref 7.0–26.0)
CO2: 29 mEq/L (ref 22–29)
Calcium: 9.2 mg/dL (ref 8.4–10.4)
Chloride: 104 mEq/L (ref 98–109)
Creatinine: 0.9 mg/dL (ref 0.6–1.1)
EGFR: 69 mL/min/{1.73_m2} — ABNORMAL LOW (ref >90)
Glucose: 81 mg/dl (ref 70–140)
Potassium: 4.7 mEq/L (ref 3.5–5.1)
Sodium: 138 mEq/L (ref 136–145)
Total Bilirubin: 0.34 mg/dL (ref 0.20–1.20)
Total Protein: 6.2 g/dL — ABNORMAL LOW (ref 6.4–8.3)

## 2015-11-26 LAB — MAGNESIUM: Magnesium: 2.2 mg/dl (ref 1.5–2.5)

## 2015-11-30 ENCOUNTER — Ambulatory Visit
Admission: RE | Admit: 2015-11-30 | Discharge: 2015-11-30 | Disposition: A | Discharge status: Home / Self Care | Payer: BC Managed Care – PPO | Source: Ambulatory Visit | Attending: Radiation Oncology | Admitting: Radiation Oncology

## 2015-11-30 DIAGNOSIS — Z510 Encounter for antineoplastic radiation therapy: Secondary | ICD-10-CM | POA: Diagnosis not present

## 2015-12-01 ENCOUNTER — Encounter: Payer: Self-pay | Admitting: Internal Medicine

## 2015-12-01 ENCOUNTER — Telehealth: Payer: Self-pay | Admitting: Internal Medicine

## 2015-12-01 ENCOUNTER — Ambulatory Visit (HOSPITAL_BASED_OUTPATIENT_CLINIC_OR_DEPARTMENT_OTHER): Payer: BC Managed Care – PPO | Admitting: Internal Medicine

## 2015-12-01 ENCOUNTER — Other Ambulatory Visit (HOSPITAL_BASED_OUTPATIENT_CLINIC_OR_DEPARTMENT_OTHER): Payer: BC Managed Care – PPO

## 2015-12-01 ENCOUNTER — Ambulatory Visit
Admission: RE | Admit: 2015-12-01 | Discharge: 2015-12-01 | Disposition: A | Discharge status: Home / Self Care | Payer: BC Managed Care – PPO | Source: Ambulatory Visit | Attending: Radiation Oncology | Admitting: Radiation Oncology

## 2015-12-01 VITALS — BP 128/77 | HR 95 | Temp 98.7°F | Resp 20 | Ht 59.0 in | Wt 93.8 lb

## 2015-12-01 DIAGNOSIS — C3491 Malignant neoplasm of unspecified part of right bronchus or lung: Secondary | ICD-10-CM

## 2015-12-01 DIAGNOSIS — R059 Cough, unspecified: Secondary | ICD-10-CM

## 2015-12-01 DIAGNOSIS — Z510 Encounter for antineoplastic radiation therapy: Secondary | ICD-10-CM | POA: Diagnosis not present

## 2015-12-01 DIAGNOSIS — C3411 Malignant neoplasm of upper lobe, right bronchus or lung: Secondary | ICD-10-CM

## 2015-12-01 DIAGNOSIS — R05 Cough: Secondary | ICD-10-CM | POA: Diagnosis not present

## 2015-12-01 DIAGNOSIS — C3490 Malignant neoplasm of unspecified part of unspecified bronchus or lung: Secondary | ICD-10-CM

## 2015-12-01 LAB — CBC WITH DIFFERENTIAL/PLATELET
BASO%: 0.5 % (ref 0.0–2.0)
Basophils Absolute: 0 10*3/uL (ref 0.0–0.1)
EOS%: 0.9 % (ref 0.0–7.0)
Eosinophils Absolute: 0 10*3/uL (ref 0.0–0.5)
HCT: 38.9 % (ref 34.8–46.6)
HGB: 13.2 g/dL (ref 11.6–15.9)
LYMPH%: 28.6 % (ref 14.0–49.7)
MCH: 30.8 pg (ref 25.1–34.0)
MCHC: 33.9 g/dL (ref 31.5–36.0)
MCV: 90.8 fL (ref 79.5–101.0)
MONO#: 0.6 10*3/uL (ref 0.1–0.9)
MONO%: 13.6 % (ref 0.0–14.0)
NEUT#: 2.5 10*3/uL (ref 1.5–6.5)
NEUT%: 56.4 % (ref 38.4–76.8)
Platelets: 219 10*3/uL (ref 145–400)
RBC: 4.28 10*6/uL (ref 3.70–5.45)
RDW: 17.3 % — ABNORMAL HIGH (ref 11.2–14.5)
WBC: 4.4 10*3/uL (ref 3.9–10.3)
lymph#: 1.2 10*3/uL (ref 0.9–3.3)

## 2015-12-01 LAB — COMPREHENSIVE METABOLIC PANEL
ALT: 16 U/L (ref 0–55)
AST: 19 U/L (ref 5–34)
Albumin: 3.7 g/dL (ref 3.5–5.0)
Alkaline Phosphatase: 54 U/L (ref 40–150)
Anion Gap: 9 mEq/L (ref 3–11)
BUN: 19 mg/dL (ref 7.0–26.0)
CO2: 27 mEq/L (ref 22–29)
Calcium: 9.5 mg/dL (ref 8.4–10.4)
Chloride: 102 mEq/L (ref 98–109)
Creatinine: 0.9 mg/dL (ref 0.6–1.1)
EGFR: 66 mL/min/{1.73_m2} — ABNORMAL LOW (ref >90)
Glucose: 93 mg/dl (ref 70–140)
Potassium: 4.4 mEq/L (ref 3.5–5.1)
Sodium: 138 mEq/L (ref 136–145)
Total Bilirubin: 0.45 mg/dL (ref 0.20–1.20)
Total Protein: 6.4 g/dL (ref 6.4–8.3)

## 2015-12-01 LAB — MAGNESIUM: Magnesium: 2.2 mg/dl (ref 1.5–2.5)

## 2015-12-01 NOTE — Progress Notes (Signed)
[facility] [phone]   Fax:(336) [place], MD [place] [address]  DIAGNOSIS: Stage IIIA (T2a, N2, M0) non-small cell lung cancer, adenocarcinoma with positive EGFR mutation with deletion in exon 19, presented with right upper lobe lung mass in addition to mediastinal lymphadenopathy status post surgical resection in February 2017.   PRIOR THERAPY:  1) Right VATS with right upper lobectomy with bronchoplasty closure and en bloc resection of a wedge from the superior segment of the right lower lobe in addition to mediastinal lymph node dissection on 07/30/2015 under the care of Dr. Roxan Hockey. 2) Adjuvant systemic chemotherapy with cisplatin 75 MG/M2 and Alimta 500 MG/M2 every 3 weeks. First dose was given on 09/10/2015. Status post 4 cycles.   CURRENT THERAPY: Adjuvant radiotherapy to the mediastinum under the care of Dr. Tammi Klippel.  INTERVAL HISTORY: [name] West 65 y.o. female returns to the clinic today for follow-up visit accompanied by her husband. The patient is doing fine today with no specific complaints except for persistent dry cough. She tolerated the last cycle of her systemic chemotherapy fairly well. She continues to have dry cough but no significant chest pain, shortness of breath or hemoptysis. She has no fever or chills. She has no significant weight loss or night sweats. She has no nausea or vomiting. She had repeat CT scan of the chest performed recently and she is here for evaluation and discussion of her scan results.  MEDICAL HISTORY: Past Medical History  Diagnosis Date  . Arthritis     shoulders  . Ovarian cyst   . Family history of adverse reaction to anesthesia     Patients grandmother died while having hand surgery; pt unsure of cause  . Environmental and seasonal allergies   . Complication of anesthesia     nausea  . PONV (postoperative nausea and vomiting)   .  Anxiety   . Cough 09/17/2015  . Oral thrush 10/22/2015  . Encounter for antineoplastic chemotherapy 10/22/2015    ALLERGIES:  is allergic to avelox; cephalosporins; clindamycin/lincomycin; penicillins; macrobid; omnipaque; adhesive; doxycycline; lidoderm; pseudoephedrine; and sulfa antibiotics.  MEDICATIONS:  Current Outpatient Prescriptions  Medication Sig Dispense Refill  . acetaminophen (TYLENOL) 500 MG tablet Take 2 tablets (1,000 mg total) by mouth every 6 (six) hours as needed. 30 tablet 0  . AMBULATORY NON FORMULARY MEDICATION Medication Name: MAGIC MOUTHWASH 2 % Viscous Lidocaine  Maalox Benadryl Susp.  Disp: 1:1:1 217m bottle Sig: 130mPO Swish & Spit  q 3-4hrs. PRN mouth sores 200 mL 3  . Azelaic Acid (FINACEA) 15 % cream Apply 1 application topically 2 (two) times daily. After skin is thoroughly washed and patted dry, gently but thoroughly massage a thin film of azelaic acid cream into the affected area twice daily, in the morning and evening.    . Brimonidine Tartrate (MIRVASO) 0.33 % GEL Apply 1 application topically daily.    . cholecalciferol (VITAMIN D) 1000 UNITS tablet Take 1,000 Units by mouth daily.    . citalopram (CELEXA) 10 MG tablet Take 10 mg by mouth every morning.    . Marland Kitchenexamethasone (DECADRON) 4 MG tablet 4 mg by mouth twice a day the day before, day of and day after the chemotherapy every 3 weeks 40 tablet 1  . diphenhydrAMINE (BENADRYL) 25 MG tablet Take 0.5 tablets (12.5 mg total) by mouth once. 2 hours before the scan. 1 tablet 0  . folic acid (FOLVITE) 1  MG tablet Take 1 tablet (1 mg total) by mouth daily. 30 tablet 4  . loratadine (CLARITIN) 10 MG tablet Take 1 tablet (10 mg total) by mouth daily as needed for allergies.  0  . LORazepam (ATIVAN) 0.5 MG tablet Take 0.5 mg by mouth as needed. Reported on 10/07/2015    . Multiple Vitamin (MULTIVITAMIN WITH MINERALS) TABS Take 1 tablet by mouth daily.    Marland Kitchen nystatin (MYCOSTATIN) 100000 UNITS/ML SUSP Take 1 mL by  mouth every 6 (six) hours. 60 mL 0  . polyethylene glycol powder (MIRALAX) powder Take 255 g by mouth once. 255 g 0  . predniSONE (STERAPRED UNI-PAK 21 TAB) 10 MG (21) TBPK tablet Take 1 tablet (10 mg total) by mouth daily. 5 tablet by mouth 13 hours and 2 hours before the scan 10 tablet 0  . prochlorperazine (COMPAZINE) 10 MG tablet Take 1 tablet (10 mg total) by mouth every 6 (six) hours as needed for nausea or vomiting. 30 tablet 0  . zolpidem (AMBIEN) 5 MG tablet Take 5 mg by mouth at bedtime as needed for sleep. Reported on 11/12/2015     No current facility-administered medications for this visit.    SURGICAL HISTORY:  Past Surgical History  Procedure Laterality Date  . Right rotator cuff Right   . Cesarean section  1984  . Fibroid removed    . Colonoscopy with propofol N/A 09/27/2012    Procedure: COLONOSCOPY WITH PROPOFOL;  Surgeon: Garlan Fair, MD;  Location: [facility];  Service: Endoscopy;  Laterality: N/A;  . Video bronchoscopy with endobronchial navigation N/A 07/15/2015    Procedure: VIDEO BRONCHOSCOPY WITH ENDOBRONCHIAL NAVIGATION with Biopsy;  Surgeon: Collene Gobble, MD;  Location: [facility];  Service: Thoracic;  Laterality: N/A;  . Video assisted thoracoscopy (vats)/ lobectomy Right 07/30/2015    Procedure: RIGHT VIDEO ASSISTED THORACOSCOPY (VATS)/RIGHT UPPER LOBECTOMY;  Surgeon: Melrose Nakayama, MD;  Location: [facility];  Service: Thoracic;  Laterality: Right;    REVIEW OF SYSTEMS:  Constitutional: negative Eyes: negative Ears, nose, mouth, throat, and face: negative Respiratory: positive for cough Cardiovascular: negative Gastrointestinal: negative Genitourinary:negative Integument/breast: negative Hematologic/lymphatic: negative Musculoskeletal:negative Neurological: negative Behavioral/Psych: negative Endocrine: negative Allergic/Immunologic: negative   PHYSICAL EXAMINATION: General appearance: alert, cooperative and no distress Head: Normocephalic, without  obvious abnormality, atraumatic Neck: no adenopathy, no JVD, supple, symmetrical, trachea midline and thyroid not enlarged, symmetric, no tenderness/mass/nodules Lymph nodes: Cervical, supraclavicular, and axillary nodes normal. Resp: clear to auscultation bilaterally Back: symmetric, no curvature. ROM normal. No CVA tenderness. Cardio: regular rate and rhythm, S1, S2 normal, no murmur, click, rub or gallop GI: soft, non-tender; bowel sounds normal; no masses,  no organomegaly Extremities: extremities normal, atraumatic, no cyanosis or edema Neurologic: Alert and oriented X 3, normal strength and tone. Normal symmetric reflexes. Normal coordination and gait  ECOG PERFORMANCE STATUS: 1 - Symptomatic but completely ambulatory  Blood pressure 128/77, pulse 95, temperature 98.7 F (37.1 C), temperature source Oral, resp. rate 20, height 4' 11"  (1.499 m), weight 93 lb 12.8 oz (42.547 kg), SpO2 99 %.  LABORATORY DATA: Lab Results  Component Value Date   WBC 4.4 12/01/2015   HGB 13.2 12/01/2015   HCT 38.9 12/01/2015   MCV 90.8 12/01/2015   PLT 219 12/01/2015      Chemistry      Component Value Date/Time   NA 138 11/26/2015 0901   NA 137 08/14/2015 2201   K 4.7 11/26/2015 0901   K 4.0 08/14/2015 2201   CL  97* 08/14/2015 2201   CO2 29 11/26/2015 0901   CO2 26 08/14/2015 2201   BUN 23.6 11/26/2015 0901   BUN 34* 08/14/2015 2201   CREATININE 0.9 11/26/2015 0901   CREATININE 0.75 08/14/2015 2201      Component Value Date/Time   CALCIUM 9.2 11/26/2015 0901   CALCIUM 9.5 08/14/2015 2201   ALKPHOS 52 11/26/2015 0901   ALKPHOS 33* 08/01/2015 0759   AST 18 11/26/2015 0901   AST 18 08/01/2015 0759   ALT 16 11/26/2015 0901   ALT 14 08/01/2015 0759   BILITOT 0.34 11/26/2015 0901   BILITOT 0.4 08/01/2015 0759       RADIOGRAPHIC STUDIES: Ct Chest W Contrast  11/25/2015  CLINICAL DATA:  Stage IIIA non-small cell lung cancer of the right upper lobe. Status post for section in  February 2017. EXAM: CT CHEST WITH CONTRAST TECHNIQUE: Multidetector CT imaging of the chest was performed during intravenous contrast administration. CONTRAST:  37m ISOVUE-300 IOPAMIDOL (ISOVUE-300) INJECTION 61% COMPARISON:  07/13/2015. FINDINGS: Mediastinum / Lymph Nodes: There is no axillary lymphadenopathy. No mediastinal lymphadenopathy. There is no hilar lymphadenopathy. The heart size is normal. No pericardial effusion. The esophagus has normal imaging features. Lungs / Pleura: Patient is status post right upper lobectomy with right parahilar scarring. 4 mm posterior right lower lobe subpleural nodule is seen on image 60 series 5. There is another subpleural 4 mm nodule in the posterior right lung on image 72. These are not definitely seen previously, but may be related to interval surgery. 3 mm nodule in the posterior left lower lobe (image 72 series 5) is not substantially changed in the interval. No focal airspace consolidation. No pulmonary edema or pleural effusion. Upper Abdomen:  Unremarkable. MSK / Soft Tissues: Bone windows reveal no worrisome lytic or sclerotic osseous lesions. Degenerative changes noted in the glenohumeral joints bilaterally. IMPRESSION: 1. Interval right upper lobectomy. 2. Tiny bilateral lower lobe nodules are nonspecific. Continued attention on follow-up recommended. Electronically Signed   By: EMisty StanleyM.D.   On: 11/25/2015 12:33    ASSESSMENT AND PLAN: This is a very pleasant 65years old white female recently diagnosed with a stage IIIA non-small cell lung cancer status post right upper lobectomy with lymph node dissection. She completed adjuvant systemic chemotherapy with cisplatin and Alimta status post 4 cycles. She tolerated her treatment well. The recent CT scan of the chest showed no concerning findings for disease recurrence. I discussed the scan results with the patient and her husband. I recommended for her to continue with the adjuvant radiotherapy as a  scheduled by Dr. MTammi Klippel I will see her back for follow-up visit in 3 months for evaluation after repeating CT scan of the chest for restaging of her disease. She was advised to call immediately if she has any concerning symptoms in the interval. The patient voices understanding of current disease status and treatment options and is in agreement with the current care plan.  All questions were answered. The patient knows to call the clinic with any problems, questions or concerns. We can certainly see the patient much sooner if necessary.  Disclaimer: This note was dictated with voice recognition software. Similar sounding words can inadvertently be transcribed and may not be corrected upon review.

## 2015-12-01 NOTE — Telephone Encounter (Signed)
Gave pt apt & avs °

## 2015-12-02 ENCOUNTER — Ambulatory Visit: Payer: BC Managed Care – PPO | Attending: Radiation Oncology | Admitting: Radiation Oncology

## 2015-12-02 ENCOUNTER — Ambulatory Visit
Admission: RE | Admit: 2015-12-02 | Discharge: 2015-12-02 | Disposition: A | Discharge status: Home / Self Care | Payer: BC Managed Care – PPO | Source: Ambulatory Visit | Attending: Radiation Oncology | Admitting: Radiation Oncology

## 2015-12-02 DIAGNOSIS — Z510 Encounter for antineoplastic radiation therapy: Secondary | ICD-10-CM | POA: Diagnosis not present

## 2015-12-03 ENCOUNTER — Ambulatory Visit
Admission: RE | Admit: 2015-12-03 | Discharge: 2015-12-03 | Disposition: A | Discharge status: Home / Self Care | Payer: BC Managed Care – PPO | Source: Ambulatory Visit | Attending: Radiation Oncology | Admitting: Radiation Oncology

## 2015-12-03 DIAGNOSIS — C3491 Malignant neoplasm of unspecified part of right bronchus or lung: Secondary | ICD-10-CM

## 2015-12-03 DIAGNOSIS — Z510 Encounter for antineoplastic radiation therapy: Secondary | ICD-10-CM | POA: Diagnosis not present

## 2015-12-03 MED ORDER — RADIAPLEXRX EX GEL
Freq: Once | CUTANEOUS | Status: AC
  Administered 2015-12-03: 14:00:00 via TOPICAL

## 2015-12-04 ENCOUNTER — Ambulatory Visit
Admission: RE | Admit: 2015-12-04 | Discharge: 2015-12-04 | Disposition: A | Discharge status: Home / Self Care | Payer: BC Managed Care – PPO | Source: Ambulatory Visit | Attending: Radiation Oncology | Admitting: Radiation Oncology

## 2015-12-04 VITALS — BP 124/74 | HR 80 | Temp 97.7°F | Resp 18 | Wt 94.7 lb

## 2015-12-04 DIAGNOSIS — Z510 Encounter for antineoplastic radiation therapy: Secondary | ICD-10-CM | POA: Diagnosis not present

## 2015-12-04 DIAGNOSIS — C3491 Malignant neoplasm of unspecified part of right bronchus or lung: Secondary | ICD-10-CM

## 2015-12-04 NOTE — Progress Notes (Signed)
  Radiation Oncology         [phone]   Name: [name] West MRN: [id number]   Date: 12/04/2015  DOB: May 11, 1951   Weekly Radiation Therapy Management    ICD-9-CM ICD-10-CM   1. Non-small cell cancer of right lung (HCC) 162.9 C34.91     Current Dose: 9 Gy  Planned Dose:  50.4 Gy  Narrative The patient presents for routine under treatment assessment.  She is without complaint  Set-up films were reviewed. The chart was checked.  Physical Findings  weight is 94 lb 11.2 oz (42.956 kg). Her oral temperature is 97.7 F (36.5 C). Her blood pressure is 124/74 and her pulse is 80. Her respiration is 18 and oxygen saturation is 100%. . Weight essentially stable.  No significant changes.  Impression The patient is tolerating radiation.  Plan Continue treatment as planned.     Sheral Apley Tammi Klippel, M.D.  This document serves as a record of services personally performed by Tyler Pita, MD. It was created on his behalf by Darcus Austin, a trained medical scribe. The creation of this record is based on the scribe's personal observations and the provider's statements to them. This document has been checked and approved by the attending provider.

## 2015-12-07 ENCOUNTER — Ambulatory Visit
Admission: RE | Admit: 2015-12-07 | Discharge: 2015-12-07 | Disposition: A | Discharge status: Home / Self Care | Payer: BC Managed Care – PPO | Source: Ambulatory Visit | Attending: Radiation Oncology | Admitting: Radiation Oncology

## 2015-12-07 DIAGNOSIS — C3491 Malignant neoplasm of unspecified part of right bronchus or lung: Secondary | ICD-10-CM

## 2015-12-07 DIAGNOSIS — Z510 Encounter for antineoplastic radiation therapy: Secondary | ICD-10-CM | POA: Diagnosis not present

## 2015-12-07 MED ORDER — RADIAPLEXRX EX GEL
Freq: Once | CUTANEOUS | Status: AC
  Administered 2015-12-07: 10:00:00 via TOPICAL

## 2015-12-08 ENCOUNTER — Other Ambulatory Visit: Payer: Self-pay | Admitting: Medical Oncology

## 2015-12-08 ENCOUNTER — Ambulatory Visit
Admission: RE | Admit: 2015-12-08 | Discharge: 2015-12-08 | Disposition: A | Discharge status: Home / Self Care | Payer: BC Managed Care – PPO | Source: Ambulatory Visit | Attending: Radiation Oncology | Admitting: Radiation Oncology

## 2015-12-08 DIAGNOSIS — Z510 Encounter for antineoplastic radiation therapy: Secondary | ICD-10-CM | POA: Diagnosis not present

## 2015-12-09 ENCOUNTER — Ambulatory Visit
Admission: RE | Admit: 2015-12-09 | Discharge: 2015-12-09 | Disposition: A | Discharge status: Home / Self Care | Payer: BC Managed Care – PPO | Source: Ambulatory Visit | Attending: Family Medicine | Admitting: Family Medicine

## 2015-12-09 ENCOUNTER — Ambulatory Visit
Admission: RE | Admit: 2015-12-09 | Discharge: 2015-12-09 | Disposition: A | Discharge status: Home / Self Care | Payer: BC Managed Care – PPO | Source: Ambulatory Visit | Attending: Radiation Oncology | Admitting: Radiation Oncology

## 2015-12-09 VITALS — Wt 95.6 lb

## 2015-12-09 DIAGNOSIS — C3491 Malignant neoplasm of unspecified part of right bronchus or lung: Secondary | ICD-10-CM

## 2015-12-09 DIAGNOSIS — Z510 Encounter for antineoplastic radiation therapy: Secondary | ICD-10-CM | POA: Diagnosis not present

## 2015-12-09 DIAGNOSIS — Z1231 Encounter for screening mammogram for malignant neoplasm of breast: Secondary | ICD-10-CM

## 2015-12-09 NOTE — Progress Notes (Signed)
Oriented patient to staff and routine of the clinic. Provided patient with RADIATION THERAPY AND YOU handbook then, reviewed pertinent information. Educated patient reference potential side effects and management such as fatigue, skin changes, and throat changes. Reinforced use of Radiaplex with patient. Provided patient with my business card and encouraged her to call with needs. Answered all patient questions to the best of my ability. Patient verbalized understanding of all reviewed.

## 2015-12-10 ENCOUNTER — Ambulatory Visit
Admission: RE | Admit: 2015-12-10 | Discharge: 2015-12-10 | Disposition: A | Discharge status: Home / Self Care | Payer: BC Managed Care – PPO | Source: Ambulatory Visit | Attending: Radiation Oncology | Admitting: Radiation Oncology

## 2015-12-10 ENCOUNTER — Encounter: Payer: Self-pay | Admitting: Radiation Oncology

## 2015-12-10 VITALS — BP 124/80 | HR 78 | Temp 97.9°F | Ht 59.0 in | Wt 94.1 lb

## 2015-12-10 DIAGNOSIS — C3491 Malignant neoplasm of unspecified part of right bronchus or lung: Secondary | ICD-10-CM

## 2015-12-10 DIAGNOSIS — Z510 Encounter for antineoplastic radiation therapy: Secondary | ICD-10-CM | POA: Diagnosis not present

## 2015-12-10 NOTE — Progress Notes (Signed)
[name] West has completed 9 fractions to her right lung.  She denies have pain, shortness of breath.  She continues to report having a dry, frequent cough and thinks it is worse.  She has started taking Prilosec in case it may be from reflux.  She also reports having a sore throat from the coughing.  She denies having trouble swallowing or fatigue.  The skin on her right chest and back is intact.  BP 124/80 mmHg  Pulse 78  Temp(Src) 97.9 F (36.6 C) (Oral)  Ht '4\' 11"'$  (1.499 m)  Wt 94 lb 1.6 oz (42.683 kg)  BMI 19.00 kg/m2  SpO2 100%   Wt Readings from Last 3 Encounters:  12/10/15 94 lb 1.6 oz (42.683 kg)  12/09/15 95 lb 9.6 oz (43.364 kg)  12/04/15 94 lb 11.2 oz (42.956 kg)

## 2015-12-10 NOTE — Progress Notes (Signed)
  Radiation Oncology         [id number]   Name: [name] West MRN: [id number]   Date: 12/10/2015  DOB: 11/10/1950   Weekly Radiation Therapy Management    ICD-9-CM ICD-10-CM   1. Non-small cell cancer of right lung (HCC) 162.9 C34.91     Current Dose: 16.2 Gy  Planned Dose:  50.4 Gy  Narrative The patient presents for routine under treatment assessment.  [name] West has completed 9 fractions to her right lung. She denies have pain, shortness of breath. She continues to report having a dry, frequent cough and thinks it is worse. She has started taking Prilosec in case it may be from reflux. She also reports having a sore throat from the coughing. She denies having trouble swallowing or fatigue. The skin on her right chest and back is intact. She is without complaint  Set-up films were reviewed. The chart was checked.  Physical Findings  height is '4\' 11"'$  (1.499 m) and weight is 94 lb 1.6 oz (42.683 kg). Her oral temperature is 97.9 F (36.6 C). Her blood pressure is 124/80 and her pulse is 78. Her oxygen saturation is 100%. . Weight essentially stable.  No significant changes.  Impression The patient is tolerating radiation.  Plan Continue treatment as planned.     Sheral Apley Tammi Klippel, M.D.   This document serves as a record of services personally performed by Tyler Pita, MD. It was created on his behalf by Truddie Hidden, a trained medical scribe. The creation of this record is based on the scribe's personal observations and the provider's statements to them. This document has been checked and approved by the attending provider.

## 2015-12-11 ENCOUNTER — Encounter: Payer: Self-pay | Admitting: Radiation Oncology

## 2015-12-11 ENCOUNTER — Ambulatory Visit
Admission: RE | Admit: 2015-12-11 | Discharge: 2015-12-11 | Disposition: A | Discharge status: Home / Self Care | Payer: BC Managed Care – PPO | Source: Ambulatory Visit | Attending: Radiation Oncology | Admitting: Radiation Oncology

## 2015-12-11 VITALS — BP 108/71 | HR 80 | Temp 97.5°F | Resp 16

## 2015-12-11 DIAGNOSIS — R059 Cough, unspecified: Secondary | ICD-10-CM

## 2015-12-11 DIAGNOSIS — R05 Cough: Secondary | ICD-10-CM

## 2015-12-11 DIAGNOSIS — Z510 Encounter for antineoplastic radiation therapy: Secondary | ICD-10-CM | POA: Diagnosis not present

## 2015-12-11 MED ORDER — BENZONATATE 100 MG PO CAPS: 100.0000 mg | ORAL_CAPSULE | Freq: Three times a day (TID) | ORAL | Status: DC

## 2015-12-11 MED ORDER — AZITHROMYCIN 250 MG PO TABS: ORAL_TABLET | ORAL | Status: DC

## 2015-12-11 NOTE — Progress Notes (Signed)
Patient presents to the clinic today requesting to be evaluated. Patient reports a dry cough and scratchy throat only when laying flat. No evidence of thrush seen. Reports she began taking Prilosec last week for reflux. Vitals stable. Denies pain. Reported all findings to Shona Simpson, PA-C.

## 2015-12-11 NOTE — Progress Notes (Signed)
  Radiation Oncology         [phone]   Name: Bijal Siglin MRN: [id number]   Date: 12/11/2015  DOB: December 18, 1950   Weekly Radiation Therapy Management    ICD-9-CM ICD-10-CM   1. Cough 786.2 R05     Current Dose: 18Gy  Planned Dose:  50.4 Gy  Narrative The patient presents for a work in visit. She was seen yesterday and was doing well, however yesterday evening she started to notice a barky more prominent cough that she has had since surgery but more noticeable and this caused her to be uncomfortable lying flat. She denies any shortness of breath or chest pain, and only noted this cough as being as intense as she complains of when lying flat. She noticed this during treatment today as well. She was given tessalon perles and hycodan syrup in the spring for similar symptoms but did not feel that these worked. She does not have any more medication left. She is not on any antihypertensives, and has started PPI for indigestion which has helped significantly with her reflux in the last two weeks. She also started her Allegra back yesterday due to post nasal drip. She denies any purulent mucous or hemotpysis. She denies fevers, or chills, sinus pressure, but did have a mild headache yesterday. No other complaints are noted.   Physical Findings  oral temperature is 97.5 F (36.4 C). Her blood pressure is 108/71 and her pulse is 80. Her respiration is 16 and oxygen saturation is 100%.  In general this is a well appearingCaucasian female in no acute distress. She is alert and oriented x4 and appropriate throughout the examination. HEENT reveals that the patient is normocephalic, atraumatic. EOMs are intact. PERRLA. Oropharynx is pink and injected with minimal cobblestoning. Skin is intact without any evidence of gross lesions. Cardiovascular exam reveals a regular rate and rhythm, no clicks rubs or murmurs are auscultated. Chest is clear to auscultation bilaterally. Lymphatic assessment is performed and does not  reveal any adenopathy in the cervical, supraclavicular, axillary, or inguinal chains. Abdomen has active bowel sounds in all quadrants and is intact. The abdomen is soft, non tender, non distended. Lower extremities are negative for pretibial pitting edema, deep calf tenderness, cyanosis or clubbing.   Impression Allergic Rhinitis with cough consistent with allergies and possible developing bronchitis.   Plan I discussed with the patient that her symptoms sound to be more allergic in nature, but that it is possible that she's starting to develop either a viral or bacterial bronchitis. We discussed continuation of Allegra, the addition back of tessalon perles, continuation of her PPI, and a back up Z-pak. She will keep [place] informed of her symptoms, and if this worsens over the weekend she understands to call or be evaluated, and we will check in on Monday to see how she's doing.         Carola Rhine, PAC

## 2015-12-14 ENCOUNTER — Ambulatory Visit
Admission: RE | Admit: 2015-12-14 | Discharge: 2015-12-14 | Disposition: A | Discharge status: Home / Self Care | Payer: BC Managed Care – PPO | Source: Ambulatory Visit | Attending: Radiation Oncology | Admitting: Radiation Oncology

## 2015-12-14 DIAGNOSIS — Z510 Encounter for antineoplastic radiation therapy: Secondary | ICD-10-CM | POA: Diagnosis not present

## 2015-12-16 ENCOUNTER — Ambulatory Visit
Admission: RE | Admit: 2015-12-16 | Discharge: 2015-12-16 | Disposition: A | Discharge status: Home / Self Care | Payer: BC Managed Care – PPO | Source: Ambulatory Visit | Attending: Radiation Oncology | Admitting: Radiation Oncology

## 2015-12-16 ENCOUNTER — Telehealth: Payer: Self-pay | Admitting: Internal Medicine

## 2015-12-16 DIAGNOSIS — Z510 Encounter for antineoplastic radiation therapy: Secondary | ICD-10-CM | POA: Diagnosis not present

## 2015-12-16 NOTE — Telephone Encounter (Signed)
Pt came in to resched 9/18 apt to next day, gave pt new copy of sched

## 2015-12-17 ENCOUNTER — Ambulatory Visit
Admission: RE | Admit: 2015-12-17 | Discharge: 2015-12-17 | Disposition: A | Discharge status: Home / Self Care | Payer: BC Managed Care – PPO | Source: Ambulatory Visit | Attending: Radiation Oncology | Admitting: Radiation Oncology

## 2015-12-17 DIAGNOSIS — Z510 Encounter for antineoplastic radiation therapy: Secondary | ICD-10-CM | POA: Diagnosis not present

## 2015-12-18 ENCOUNTER — Encounter: Payer: Self-pay | Admitting: Radiation Oncology

## 2015-12-18 ENCOUNTER — Ambulatory Visit
Admission: RE | Admit: 2015-12-18 | Discharge: 2015-12-18 | Disposition: A | Discharge status: Home / Self Care | Payer: BC Managed Care – PPO | Source: Ambulatory Visit | Attending: Radiation Oncology | Admitting: Radiation Oncology

## 2015-12-18 VITALS — BP 109/73 | HR 83 | Resp 16 | Wt 94.2 lb

## 2015-12-18 DIAGNOSIS — Z510 Encounter for antineoplastic radiation therapy: Secondary | ICD-10-CM | POA: Diagnosis not present

## 2015-12-18 DIAGNOSIS — C3491 Malignant neoplasm of unspecified part of right bronchus or lung: Secondary | ICD-10-CM

## 2015-12-18 MED ORDER — BENZONATATE 100 MG PO CAPS: 100.0000 mg | ORAL_CAPSULE | Freq: Three times a day (TID) | ORAL | Status: DC

## 2015-12-18 NOTE — Progress Notes (Signed)
Department of Radiation Oncology  Phone:  [phone] Fax:        [phone]  Weekly Treatment Note    Name: Laquinta Hazell Date: 12/18/2015 MRN: [id number] DOB: 1951/05/18   Diagnosis:     ICD-9-CM ICD-10-CM   1. Non-small cell cancer of right lung ([facility]) 162.9 C34.91      Current dose: 25.2 Gy  Current fraction: 14   MEDICATIONS: Current Outpatient Prescriptions  Medication Sig Dispense Refill  . acetaminophen (TYLENOL) 500 MG tablet Take 2 tablets (1,000 mg total) by mouth every 6 (six) hours as needed. 30 tablet 0  . AMBULATORY NON FORMULARY MEDICATION Medication Name: MAGIC MOUTHWASH 2 % Viscous Lidocaine  Maalox Benadryl Susp.  Disp: 1:1:1 246m bottle Sig: 110mPO Swish & Spit  q 3-4hrs. PRN mouth sores 200 mL 3  . Azelaic Acid (FINACEA) 15 % cream Apply 1 application topically 2 (two) times daily. After skin is thoroughly washed and patted dry, gently but thoroughly massage a thin film of azelaic acid cream into the affected area twice daily, in the morning and evening.    . Marland Kitchenzithromycin (ZITHROMAX) 250 MG tablet Take two tabs po on day one, then one po daily until completed 6 each 0  . benzonatate (TESSALON) 100 MG capsule Take 1-2 capsules (100-200 mg total) by mouth 3 (three) times daily. 30 capsule 0  . Brimonidine Tartrate (MIRVASO) 0.33 % GEL Apply 1 application topically daily.    . cholecalciferol (VITAMIN D) 1000 UNITS tablet Take 1,000 Units by mouth daily.    . citalopram (CELEXA) 10 MG tablet Take 10 mg by mouth every morning.    . fexofenadine (ALLEGRA) 180 MG tablet Take 180 mg by mouth daily.    . folic acid (FOLVITE) 1 MG tablet Take 1 tablet (1 mg total) by mouth daily. 30 tablet 4  . LORazepam (ATIVAN) 0.5 MG tablet Take 0.5 mg by mouth as needed. Reported on 10/07/2015    . Multiple Vitamin (MULTIVITAMIN WITH MINERALS) TABS Take 1 tablet by mouth daily.    . Marland Kitchenmeprazole (PRILOSEC) 20 MG capsule Take 20 mg by mouth 2 (two) times daily before a  meal.    . polyethylene glycol powder (MIRALAX) powder Take 255 g by mouth once. 255 g 0  . Wound Cleansers (RADIAPLEX EX) Apply topically.    . Marland Kitchenolpidem (AMBIEN) 5 MG tablet Take 5 mg by mouth at bedtime as needed for sleep. Reported on 12/10/2015    . dexamethasone (DECADRON) 4 MG tablet 4 mg by mouth twice a day the day before, day of and day after the chemotherapy every 3 weeks (Patient not taking: Reported on 12/01/2015) 40 tablet 1  . loratadine (CLARITIN) 10 MG tablet Take 1 tablet (10 mg total) by mouth daily as needed for allergies. (Patient not taking: Reported on 12/10/2015)  0  . nystatin (MYCOSTATIN) 100000 UNITS/ML SUSP Take 1 mL by mouth every 6 (six) hours. (Patient not taking: Reported on 12/10/2015) 60 mL 0  . prochlorperazine (COMPAZINE) 10 MG tablet Take 1 tablet (10 mg total) by mouth every 6 (six) hours as needed for nausea or vomiting. (Patient not taking: Reported on 12/01/2015) 30 tablet 0   No current facility-administered medications for this encounter.     ALLERGIES: Avelox; Cephalosporins; Clindamycin/lincomycin; Penicillins; Macrobid; Omnipaque; Adhesive; Doxycycline; Lidoderm; Pseudoephedrine; and Sulfa antibiotics   LABORATORY DATA:  Lab Results  Component Value Date   WBC 4.4 12/01/2015   HGB 13.2 12/01/2015   HCT 38.9 12/01/2015  MCV 90.8 12/01/2015   PLT 219 12/01/2015   Lab Results  Component Value Date   NA 138 12/01/2015   K 4.4 12/01/2015   CL 97* 08/14/2015   CO2 27 12/01/2015   Lab Results  Component Value Date   ALT 16 12/01/2015   AST 19 12/01/2015   ALKPHOS 54 12/01/2015   BILITOT 0.45 12/01/2015     NARRATIVE: [name] West was seen today for weekly treatment management. The chart was checked and the patient's films were reviewed.  Weight and vitals stable. Denies pain. Reports dry cough continues but, frequency is less. Reports tessalon perles do help but, a refill is needed. Reports throat irritation continues but, denies difficulty  swallowing. She has had this cough since surgery. She takes half an ativan at night to relax her cough, this lasts until about 3 am. She is able to get back to sleep but does wake up coughing intermittently. Mild hyperpigmentation within treatment field without desquamation noted. Reports using radiaplex as directed. Reports mild fatigue.   PHYSICAL EXAMINATION: weight is 94 lb 3.2 oz (42.729 kg). Her blood pressure is 109/73 and her pulse is 83. Her respiration is 16 and oxygen saturation is 100%.      Alert. In no acute distress.  ASSESSMENT: The patient is doing satisfactorily with treatment.  PLAN: We will continue with the patient's radiation treatment as planned. I have refilled her Tessalon Perles today.    ------------------------------------------------  Jodelle Gross, MD, PhD   This document serves as a record of services personally performed by Kyung Rudd, MD. It was created on his behalf by Arlyce Harman, a trained medical scribe. The creation of this record is based on the scribe's personal observations and the provider's statements to them. This document has been checked and approved by the attending provider.

## 2015-12-18 NOTE — Progress Notes (Signed)
Weight and vitals stable. Denies pain. Reports dry cough continues but, frequency is less. Reports tessalon pearls do help but, a refill is needed. Reports throat irritation continues but, denies difficulty swallowing. Mild hyperpigmentation within treatment field without desquamation noted. Reports using radiaplex as directed.  Reports mild fatigue.   BP 109/73 mmHg  Pulse 83  Resp 16  Wt 94 lb 3.2 oz (42.729 kg)  SpO2 100% Wt Readings from Last 3 Encounters:  12/18/15 94 lb 3.2 oz (42.729 kg)  12/10/15 94 lb 1.6 oz (42.683 kg)  12/09/15 95 lb 9.6 oz (43.364 kg)

## 2015-12-21 ENCOUNTER — Ambulatory Visit
Admission: RE | Admit: 2015-12-21 | Discharge: 2015-12-21 | Disposition: A | Discharge status: Home / Self Care | Payer: BC Managed Care – PPO | Source: Ambulatory Visit | Attending: Radiation Oncology | Admitting: Radiation Oncology

## 2015-12-21 DIAGNOSIS — Z510 Encounter for antineoplastic radiation therapy: Secondary | ICD-10-CM | POA: Diagnosis not present

## 2015-12-22 ENCOUNTER — Ambulatory Visit
Admission: RE | Admit: 2015-12-22 | Discharge: 2015-12-22 | Disposition: A | Discharge status: Home / Self Care | Payer: BC Managed Care – PPO | Source: Ambulatory Visit | Attending: Radiation Oncology | Admitting: Radiation Oncology

## 2015-12-22 DIAGNOSIS — Z510 Encounter for antineoplastic radiation therapy: Secondary | ICD-10-CM | POA: Diagnosis not present

## 2015-12-23 ENCOUNTER — Ambulatory Visit
Admission: RE | Admit: 2015-12-23 | Discharge: 2015-12-23 | Disposition: A | Discharge status: Home / Self Care | Payer: BC Managed Care – PPO | Source: Ambulatory Visit | Attending: Radiation Oncology | Admitting: Radiation Oncology

## 2015-12-23 DIAGNOSIS — Z510 Encounter for antineoplastic radiation therapy: Secondary | ICD-10-CM | POA: Diagnosis not present

## 2015-12-24 ENCOUNTER — Ambulatory Visit
Admission: RE | Admit: 2015-12-24 | Discharge: 2015-12-24 | Disposition: A | Discharge status: Home / Self Care | Payer: BC Managed Care – PPO | Source: Ambulatory Visit | Attending: Radiation Oncology | Admitting: Radiation Oncology

## 2015-12-24 ENCOUNTER — Encounter: Payer: Self-pay | Admitting: Radiation Oncology

## 2015-12-24 VITALS — BP 112/73 | HR 83 | Resp 16 | Wt 94.8 lb

## 2015-12-24 DIAGNOSIS — C3491 Malignant neoplasm of unspecified part of right bronchus or lung: Secondary | ICD-10-CM

## 2015-12-24 DIAGNOSIS — Z510 Encounter for antineoplastic radiation therapy: Secondary | ICD-10-CM | POA: Diagnosis not present

## 2015-12-24 NOTE — Progress Notes (Signed)
Weight and vitals stable. Denies pain. Reports persistent dry cough. Reports she continues to use tessalon approximately three times per day. Reports throat irritation due to cough but, no difficulty swallowing. Faint hyperpigmentation with mild dermatitis noted center of chest. Instructed to continue radiaplex and add hydrocortisone 1 % to manage itchy skin. Patient verbalized understanding. Reports mild fatigue.   BP 112/73 mmHg  Pulse 83  Resp 16  Wt 94 lb 12.8 oz (43.001 kg)  SpO2 100% Wt Readings from Last 3 Encounters:  12/24/15 94 lb 12.8 oz (43.001 kg)  12/18/15 94 lb 3.2 oz (42.729 kg)  12/10/15 94 lb 1.6 oz (42.683 kg)

## 2015-12-24 NOTE — Progress Notes (Signed)
  Radiation Oncology         [id number]   Name: [name] West MRN: [id number]   Date: 12/24/2015  DOB: 1950/07/20   Weekly Radiation Therapy Management     ICD-9-CM ICD-10-CM   1. Non-small cell cancer of right lung (HCC) 162.9 C34.91      Current Dose: 32.4 Gy  Planned Dose:  50.4 Gy  Narrative Weight and vitals stable. Denies pain. Reports persistent dry cough. Reports she continues to use tessalon approximately three times per day. Reports throat irritation due to cough but, no difficulty swallowing. Faint hyperpigmentation with mild dermatitis noted center of chest. Instructed to continue radiaplex and add hydrocortisone 1 % to manage itchy skin. Patient verbalized understanding. Reports mild fatigue.   Set-up films were reviewed. The chart was checked.  Physical Findings  weight is 94 lb 12.8 oz (43.001 kg). Her blood pressure is 112/73 and her pulse is 83. Her respiration is 16 and oxygen saturation is 100%. . Weight essentially stable.  No significant changes.  Impression The patient is tolerating radiation.  Plan Continue treatment as planned.     Sheral Apley Tammi Klippel, M.D.  This document serves as a record of services personally performed by Tyler Pita, MD. It was created on his behalf by Truddie Hidden, a trained medical scribe. The creation of this record is based on the scribe's personal observations and the provider's statements to them. This document has been checked and approved by the attending provider.

## 2015-12-25 ENCOUNTER — Ambulatory Visit
Admission: RE | Admit: 2015-12-25 | Discharge: 2015-12-25 | Disposition: A | Discharge status: Home / Self Care | Payer: BC Managed Care – PPO | Source: Ambulatory Visit | Attending: Radiation Oncology | Admitting: Radiation Oncology

## 2015-12-25 DIAGNOSIS — Z510 Encounter for antineoplastic radiation therapy: Secondary | ICD-10-CM | POA: Diagnosis not present

## 2015-12-28 ENCOUNTER — Ambulatory Visit
Admission: RE | Admit: 2015-12-28 | Discharge: 2015-12-28 | Disposition: A | Discharge status: Home / Self Care | Payer: BC Managed Care – PPO | Source: Ambulatory Visit | Attending: Radiation Oncology | Admitting: Radiation Oncology

## 2015-12-28 DIAGNOSIS — Z510 Encounter for antineoplastic radiation therapy: Secondary | ICD-10-CM | POA: Diagnosis not present

## 2015-12-28 DIAGNOSIS — C3491 Malignant neoplasm of unspecified part of right bronchus or lung: Secondary | ICD-10-CM

## 2015-12-28 MED ORDER — RADIAPLEXRX EX GEL
Freq: Once | CUTANEOUS | Status: AC
  Administered 2015-12-28: 16:00:00 via TOPICAL

## 2015-12-29 ENCOUNTER — Ambulatory Visit
Admission: RE | Admit: 2015-12-29 | Discharge: 2015-12-29 | Disposition: A | Discharge status: Home / Self Care | Payer: BC Managed Care – PPO | Source: Ambulatory Visit | Attending: Radiation Oncology | Admitting: Radiation Oncology

## 2015-12-29 DIAGNOSIS — Z510 Encounter for antineoplastic radiation therapy: Secondary | ICD-10-CM | POA: Diagnosis not present

## 2015-12-30 ENCOUNTER — Ambulatory Visit
Admission: RE | Admit: 2015-12-30 | Discharge: 2015-12-30 | Disposition: A | Discharge status: Home / Self Care | Payer: BC Managed Care – PPO | Source: Ambulatory Visit | Attending: Radiation Oncology | Admitting: Radiation Oncology

## 2015-12-30 DIAGNOSIS — Z510 Encounter for antineoplastic radiation therapy: Secondary | ICD-10-CM | POA: Diagnosis not present

## 2015-12-31 ENCOUNTER — Ambulatory Visit
Admission: RE | Admit: 2015-12-31 | Discharge: 2015-12-31 | Disposition: A | Discharge status: Home / Self Care | Payer: BC Managed Care – PPO | Source: Ambulatory Visit | Attending: Radiation Oncology | Admitting: Radiation Oncology

## 2015-12-31 ENCOUNTER — Other Ambulatory Visit: Payer: Self-pay | Admitting: Radiation Oncology

## 2015-12-31 DIAGNOSIS — C3491 Malignant neoplasm of unspecified part of right bronchus or lung: Secondary | ICD-10-CM

## 2015-12-31 DIAGNOSIS — Z510 Encounter for antineoplastic radiation therapy: Secondary | ICD-10-CM | POA: Diagnosis not present

## 2015-12-31 DIAGNOSIS — R059 Cough, unspecified: Secondary | ICD-10-CM

## 2015-12-31 DIAGNOSIS — R05 Cough: Secondary | ICD-10-CM

## 2015-12-31 MED ORDER — BENZONATATE 100 MG PO CAPS: 100.0000 mg | ORAL_CAPSULE | Freq: Three times a day (TID) | ORAL | Status: DC

## 2016-01-01 ENCOUNTER — Encounter: Payer: Self-pay | Admitting: Radiation Oncology

## 2016-01-01 ENCOUNTER — Telehealth: Payer: Self-pay | Admitting: *Deleted

## 2016-01-01 ENCOUNTER — Ambulatory Visit
Admission: RE | Admit: 2016-01-01 | Discharge: 2016-01-01 | Disposition: A | Discharge status: Home / Self Care | Payer: BC Managed Care – PPO | Source: Ambulatory Visit | Attending: Radiation Oncology | Admitting: Radiation Oncology

## 2016-01-01 VITALS — BP 106/71 | HR 84 | Temp 97.9°F | Ht 59.0 in | Wt 94.7 lb

## 2016-01-01 DIAGNOSIS — Z510 Encounter for antineoplastic radiation therapy: Secondary | ICD-10-CM | POA: Diagnosis not present

## 2016-01-01 DIAGNOSIS — C3491 Malignant neoplasm of unspecified part of right bronchus or lung: Secondary | ICD-10-CM

## 2016-01-01 NOTE — Telephone Encounter (Signed)
CALLED PATIENT TO INFORM OF SURVIVORSHIP APPT. FOR 02-12-16 @ 9:30 AM WITH GRETCHEN DAWSON, LVM FOR A RETURN CALL

## 2016-01-01 NOTE — Progress Notes (Signed)
[name] West has completed 24 fractions to her right lung.  She denies pain today but reports having dull pain in her right chest radiation to her jaw for the past 2 Friday nights.  She said it lasted for about 5 minutes.  She continues to reports having a dry cough.  She is using tessalon perle's.  She denies having shortness of breath.  She reports her throat is irritated from her cough.  She reports having trouble swallowing bread.  The skin on her upper chest and back is red.  She is using radiaplex.  She reports having fatigue in the afternoons.  She has been given a one month follow up appointment.  BP 106/71 mmHg  Pulse 84  Temp(Src) 97.9 F (36.6 C) (Oral)  Ht '4\' 11"'$  (1.499 m)  Wt 94 lb 11.2 oz (42.956 kg)  BMI 19.12 kg/m2  SpO2 100%   Wt Readings from Last 3 Encounters:  01/01/16 94 lb 11.2 oz (42.956 kg)  12/24/15 94 lb 12.8 oz (43.001 kg)  12/18/15 94 lb 3.2 oz (42.729 kg)

## 2016-01-01 NOTE — Progress Notes (Signed)
[name] West has completed 24 fractions to her right lung. She denies pain today but reports having dull pain in her right chest radiation to her jaw for the past 2 Friday nights. She said it lasted for about 5 minutes. She continues to reports having a dry cough. She is using tessalon perle's. She denies having shortness of breath. She reports her throat is irritated from her cough. She reports having trouble swallowing bread. The skin on her upper chest and back is red. She is using radiaplex. She reports having fatigue in the afternoons. She has been given a one month follow up appointment.  BP 106/71 mmHg  Pulse 84  Temp(Src) 97.9 F (36.6 C) (Oral)  Ht '4\' 11"'$  (1.499 m)  Wt 94 lb 11.2 oz (42.956 kg)  BMI 19.12 kg/m2  SpO2 100%   Wt Readings from Last 3 Encounters:  01/01/16 94 lb 11.2 oz (42.956 kg)  12/24/15 94 lb 12.8 oz (43.001 kg)  12/18/15 94 lb 3.2 oz (42.729 kg)

## 2016-01-01 NOTE — Progress Notes (Signed)
  Radiation Oncology         [phone]   Name: [name] West MRN: [id number]   Date: 01/01/2016  DOB: 09-Feb-1951   Weekly Radiation Therapy Management     No diagnosis found.   Current Dose: 43.4 Gy  Planned Dose:  50.4 Gy  Narrative Ms. Amerman has completed 24 fractions to her right lung. She denies pain today but reports having dull pain in her left chest radiation to her jaw for the past 2 Friday nights. She said it lasted for about 5 minutes. She continues to reports having a dry cough. She is using tessalon perle's. She denies having shortness of breath. She reports her throat is irritated from her cough. She reports having trouble swallowing bread. The skin on her upper chest and back is red. She is using radiaplex. She reports having fatigue in the afternoons. She has been given a one month follow up appointment.   Set-up films were reviewed. The chart was checked.  Physical Findings  height is '4\' 11"'$  (1.499 m) and weight is 94 lb 11.2 oz (42.956 kg). Her oral temperature is 97.9 F (36.6 C). Her blood pressure is 106/71 and her pulse is 84. Her oxygen saturation is 100%. . Weight essentially stable.  No significant changes. Lungs are clear bilaterally to auscultation.  No rubs or clicks noted.  Impression The patient is tolerating radiation. Left sided chest pain possibly esophageal spasm vs costochondritis related to radiotherapy  Plan Continue treatment as planned.     Sheral Apley Tammi Klippel, M.D.  This document serves as a record of services personally performed by Tyler Pita, MD. It was created on his behalf by Truddie Hidden, a trained medical scribe. The creation of this record is based on the scribe's personal observations and the provider's statements to them. This document has been checked and approved by the attending provider.

## 2016-01-04 ENCOUNTER — Ambulatory Visit
Admission: RE | Admit: 2016-01-04 | Discharge: 2016-01-04 | Disposition: A | Discharge status: Home / Self Care | Payer: BC Managed Care – PPO | Source: Ambulatory Visit | Attending: Radiation Oncology | Admitting: Radiation Oncology

## 2016-01-04 DIAGNOSIS — Z510 Encounter for antineoplastic radiation therapy: Secondary | ICD-10-CM | POA: Diagnosis not present

## 2016-01-05 ENCOUNTER — Ambulatory Visit
Admission: RE | Admit: 2016-01-05 | Discharge: 2016-01-05 | Disposition: A | Discharge status: Home / Self Care | Payer: BC Managed Care – PPO | Source: Ambulatory Visit | Attending: Radiation Oncology | Admitting: Radiation Oncology

## 2016-01-05 DIAGNOSIS — Z510 Encounter for antineoplastic radiation therapy: Secondary | ICD-10-CM | POA: Diagnosis not present

## 2016-01-06 ENCOUNTER — Ambulatory Visit
Admission: RE | Admit: 2016-01-06 | Discharge: 2016-01-06 | Disposition: A | Discharge status: Home / Self Care | Payer: BC Managed Care – PPO | Source: Ambulatory Visit | Attending: Radiation Oncology | Admitting: Radiation Oncology

## 2016-01-06 ENCOUNTER — Telehealth: Payer: Self-pay | Admitting: *Deleted

## 2016-01-06 DIAGNOSIS — Z510 Encounter for antineoplastic radiation therapy: Secondary | ICD-10-CM | POA: Diagnosis not present

## 2016-01-07 ENCOUNTER — Ambulatory Visit
Admission: RE | Admit: 2016-01-07 | Discharge: 2016-01-07 | Disposition: A | Discharge status: Home / Self Care | Payer: BC Managed Care – PPO | Source: Ambulatory Visit | Attending: Radiation Oncology | Admitting: Radiation Oncology

## 2016-01-07 ENCOUNTER — Encounter: Payer: Self-pay | Admitting: Radiation Oncology

## 2016-01-07 DIAGNOSIS — Z510 Encounter for antineoplastic radiation therapy: Secondary | ICD-10-CM | POA: Diagnosis not present

## 2016-01-11 NOTE — Progress Notes (Signed)
  Radiation Oncology         [phone]) [phone] ________________________________  Name: [name] West MRN: [id number]  Date: 01/07/2016  DOB: [birth year]   End of Treatment Note  Diagnosis: 65 y.o. 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  Beams/energy:   A five field 3D conformal treatment arrangement was used delivering 6 and 10 MV photons.  Daily image-guidance CT was used to align the treatment with the targeted volume  Narrative: The patient tolerated radiation treatment relatively well.  The patient experienced some esophagitis characterized as difficulty swallowing bread and a frequent cough.  The patient also noted fatigue.  Plan: The patient has completed radiation treatment. The patient will return to radiation oncology clinic for routine followup in one month. I advised her to call or return sooner if she has any questions or concerns related to her recovery or treatment.  ________________________________  Sheral Apley. Tammi Klippel, M.D.  This document serves as a record of services personally performed by Tyler Pita, MD. It was created on his behalf by Darcus Austin, a trained medical scribe. The creation of this record is based on the scribe's personal observations and the provider's statements to them. This document has been checked and approved by the attending provider.

## 2016-01-19 DIAGNOSIS — R6889 Other general symptoms and signs: Secondary | ICD-10-CM | POA: Diagnosis not present

## 2016-01-25 DIAGNOSIS — R350 Frequency of micturition: Secondary | ICD-10-CM | POA: Diagnosis not present

## 2016-01-26 ENCOUNTER — Telehealth: Payer: Self-pay | Admitting: Radiation Oncology

## 2016-01-26 ENCOUNTER — Telehealth: Payer: Self-pay | Admitting: *Deleted

## 2016-01-26 NOTE — Telephone Encounter (Signed)
Patient left message requesting return call from this RN about antibiotics and vaccines. Returned patient's call. No answer. Left message explaining that from a radiation stand point she was fine to take antibiotics prescribed for a sinus or urinary tract infection. However, I advised against her getting a flu or pneumonia vaccine was suffering from a UTI or sinus infection. Strongly encourage patient to consult her medical oncologist with these same questions. Encouraged patient to call this RN back with any further questions. Provided patient with my contact number.

## 2016-01-26 NOTE — Telephone Encounter (Signed)
Oncology Nurse Navigator Documentation  Oncology Nurse Navigator Flowsheets 01/26/2016  Navigator Location [facility]  Navigator Encounter Type Telephone  Telephone Outgoing Call/[name] West emailed me having questions about CT scan.  I contacted pre cert team who stated is have been authorized.  I called [name] West to update.  I gave her central scheduling phone number to call.  She also has a question about vaccines, flu and pneumococcal.  She would like to get these if ok with Dr. Julien Nordmann.  Dr. Julien Nordmann updated and approved.  Patient will be getting vaccines from PCP.    Treatment Phase Post-Tx Follow-up  Barriers/Navigation Needs Coordination of Care  Interventions Coordination of Care  Coordination of Care Appts  Acuity Level 2  Acuity Level 2 Assistance expediting appointments  Time Spent with Patient 30

## 2016-02-07 ENCOUNTER — Encounter: Payer: Self-pay | Admitting: Internal Medicine

## 2016-02-09 ENCOUNTER — Encounter: Payer: Self-pay | Admitting: Adult Health

## 2016-02-09 ENCOUNTER — Ambulatory Visit: Payer: Medicare Other | Admitting: Radiation Oncology

## 2016-02-09 DIAGNOSIS — Z23 Encounter for immunization: Secondary | ICD-10-CM | POA: Diagnosis not present

## 2016-02-09 NOTE — Progress Notes (Signed)
Lung Cancer Treatment Summary & Survivorship Care Plan Provided by Mike Craze, NP on 02/10/2016   General Information  Patient Name [name] West  Patient ID [id number]   Date of Birth 1950-12-23     Your Care Team  [name], MD - PCP (Family Medicine) [place]esto Charon, MD - Thoracic Surgeon Curt Bears, MD - Medical Oncologist Norton Blizzard, RN - Thoracic Nurse Navigator Dory Peru, RD - Dietitian Tyler Pita, MD - Radiation Oncologist Shona Simpson, PA - Physician Assistant, Radiation Oncology Mike Craze, NP - Survivorship Nurse Practitioner   *To reach your [facility] providers, call [phone].   Cancer Diagnosis Information  Diagnosis Non-Small Cell Lung Cancer  Tumor Location Right upper lobe  Diagnosis Date 06/21/15  Pathology Adenocarcinoma  Staging  Stage IIIA (T2a, N2, M0)  Family History No family history on file.   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  Work up showed lung mass.   Non-small cell cancer of right lung [facility])   Staging form: Lung, AJCC 7th Edition     Clinical stage from 08/27/2015: Stage IIIA (T2a, N2, M0) - Signed by Curt Bears, MD on 08/27/2015       Non-small cell cancer of right lung ([place])   06/21/2015 Imaging    CT Chest There is a 3.7 x 4.5 x 4.0 cm sub solid pulmonary mass versus area of airspace consolidation in the right upper lobe. Spiculations are seen extending to the anterior and lateral pleural surfaces.      06/25/2015 PET scan    Malignant range FDG uptake associated with mass-like density in RUL. No evidence of mediastinal or hilar lymph node mets and no distant metastatic disease.       07/15/2015 Initial Diagnosis    Non-small cell lung cancer ([place]) Right upper lobe, diagnosed by navigational bronchoscopy      07/15/2015 Surgery     Operation: Flexible video fiberoptic bronchoscopy with electromagnetic navigation and biopsies      07/30/2015 Surgery    PROCEDURE:    Right video-assisted thoracoscopy Thoracoscopic right upper lobectomy with bronchoplastic closure and en bloc resection of a wedge from the superior segment of the right lower lobe Mediastinal lymph node dissection.      07/30/2015 Pathology Results    RUL: Well-diff adenocarcinoma of lung, 4 cm, invades visceral pleura, LVI(+), negative margins.  Dissected lymph nodes from level 7, 10R, 12R, and 4R showed evidence for metastatic adenocarcinoma (8/13 LNs positive).    pT2a, pN2: Stage IIIA       08/13/2015 Imaging    MRI Brain No abnormal enhancement to suggest metastatic disease to the brain or meninges. No osseous lesions are identified.      09/10/2015 - 11/12/2015 Adjuvant Chemotherapy    Cisplatin/Pemetrexed x 4 cycles       11/25/2015 Imaging    CT chest: Interval RUL lobectomy. Tiny nonspecific bilat lower lobe nodules. Continued attention on f/u recommended.       11/30/2015 - 01/07/2016 Radiation Therapy    Adjuvant radiation Tammi Klippel): The primary tumor and involved mediastinal adenopathy were treated to 50.4 Gy in 28 fractions of 1.8 Gy.         Treatment Goal Curative         Your cancer treatment included surgery, chemotherapy, and radiation therapy.  Surgery: Type of Surgery Right upper lobectomy, wedge resection, and mediastinal lymph node dissection   Date of Surgery 07/30/15  Number of Lymph Nodes Removed 13  Number of Positive Lymph Nodes 8  Positive Surgical Margins?   No, surgical margins were negative    Side Effects of Surgery  Possible Long-Term:  Scars  Chronic pain  Possible Late-Term (5 or more years after treatment): Marland Kitchen Lymphedema or Swelling of the arms, neck, and/or chest region          Chemotherapy:  Drugs Received Date Started Date Completed # cycles (number of times drug was  received) Comments  [x]   Cisplatin  09/10/15  11/12/15 4   [x]   Pemetrexed ([place])  09/10/15  11/12/15 4         Side Effects of Chemotherapy/Biotherapy  Possible Long-Term: Fatigue Neuropathy Cognitive dysfunction (changes in memory or concentration) Heart failure Kidney failure Infertility Liver problems  Possible Late-Term (five or more years after treatment): Cataracts Infertility Liver problems Lung disease Osteoporosis Reduced lung capacity Secondary primary cancer                     Radiation Therapy: Type of Radiation External Beam Radiation Therapy (EBRT)   Location of Radiation Right upper lobe and mediastinal lymph nodes  Radiation Period Start Date: 11/30/15              End Date: 01/07/16  Number of Radiation Treatments 28 treatments  Total Radiation Dose 50.4 Gy    Side Effects of Radiation  Possible Long-Term Fatigue Skin irritation/discoloration Hair loss in treated areas  Possible Late-Term (five or more years after treatment)  Skin changes (including discoloration to the treated area) Lung damage/irritation Formation of scar tissue Rib damage (rare) Heart irritation or damage (for left-sided tumors) Damage to any normal tissues in the irradiated field Secondary cancers (rare) New primary cancers (rare)   Whether you experience late side effects will depend on: The part of your body that was treated The dose and length of your radiation therapy And if you received chemotherapy before, during, or after radiation therapy .                    Survivorship Care & Follow-up Schedule   Becoming a cancer survivor is a time of great celebration, but also a time of many questions and concerns.  The following information and resources are for you, your caregivers, and your primary care doctor to better understand the needs of a cancer survivor.     How often will I see my cancer providers: Survivor Years 0-2  (Survivor  Years defined by length of time since cancer diagnosis)  When?  Responsible Provider  Medical Oncology visits Every 6 months Dr. Julien Nordmann  Cardiothoracic Surgeon visits Every 6 months Dr. Roxan Hockey  Radiation Oncology visits Every 6 months Dr. Tammi Klippel  CT scan of chest Every 6 months Dr. Tammi Klippel or Dr. Julien Nordmann   Survivorship Nurse Practitioner (NP) After treatment completed Mike Craze, NP   How often will I see my cancer providers: Survivor Years 3-5+ (Survivor Years defined by length of time since cancer diagnosis)  When?  Responsible Provider  Medical Oncology visits Annually Dr. Julien Nordmann  Radiation Oncology visits Annually Dr. Tammi Klippel  Scans/Imaging exams Annually Dr. Tammi Klippel, Dr. Julien Nordmann, or Mike Craze, NP  Survivorship Nurse Practitioner (NP) Annually when your doctor refers you to survivorship as a long-term survivor! Mike Craze, NP  Screening Recommendations Get regular screenings, as indicated by your Primary Care Provider   Frequency Responsible Provider  Annual Physical By your Primary Care Provider Should include skin examination Annually Discuss with [name] (PCP)  Colonoscopy Beginning at age 3 unless clinically indicated to begin sooner Every 10 Years Discuss with [name] (PCP)  Vaccinations (Flu, Shingles, Pneumonia, TDaP, etc.) Discuss with [name] (PCP) Discuss with [name] (PCP)           Common Complaints/Concerns of Cancer Survivors  *Patients often worry if what they are experiencing is normal, or to be expected, given their cancer history and treatment.  Below are common complaints & concerns reported by cancer survivors. If you are concerned about symptoms you are experiencing, please report all symptoms to your cancer care team!   Common Complaints/Concerns for Lung Cancer Survivors   Fatigue  Dry cough  Memory problems and/or confusion  Depression  Anxiety  Weight  changes  Insomnia or trouble sleeping  Decreased range of motion (difficulty moving neck)  Intimacy and sexuality  Marital/partner/family relationships   Employment, health insurance concerns, and/or finances  Concerns regarding spirituality  Exercise after cancer treatments  Maintaining a tobacco-free lifestyle            Symptoms to Watch For and Report to Your Provider   *Often cancer survivors are fearful about their cancer coming back or being diagnosed with a new cancer.  Below are symptoms that you and your loved ones should watch for and report to your provider.   General Symptoms to Watch for and Report to Your Provider .  Return of the cancer symptoms you had before- such as a lump or new growth where your cancer first started . New or unusual pain that seems unrelated to an injury and does not go away, including back pain or bone pain . Weight loss without trying/intending . Unexplained bleeding . A rash or allergic reaction, such as swelling, severe itching or wheezing . Chills or fevers . Persistent headaches . Shortness of breath or difficulty breathing . Bloody stools or blood in your urine . Lumps, bumps, swelling and/or nipple discharge . Nausea, vomiting, diarrhea, loss of appetite, or trouble swallowing . A cough that doesn't go away . Abdominal pain . Swelling in your arms or legs . Fractures . Any other signs mentioned by your doctor or nurse or any unusual symptoms                 that you just can't explain   NOTE: Just because you have certain symptoms, it doesn't mean the cancer has come back or you have a new cancer. Symptoms can be due to other problems that need to be addressed.  It is important to watch for these symptoms and report them to your provider so you can be medically evaluated for any of these concerns!                     What Now?  Thriving & Surviving In Your Life After Cancer   Stop smoking or continue  to abstain from tobacco use. If you need help with smoking cessation, talk to your healthcare team about different options to help you!  Consider participating in "Finding Your New Normal" [facility]) survivorship series or other support groups through our [facility].   [employer] fitness program for cancer survivors.   Consider FREE counseling at cancer center.  For more information, contact Hassan Rowan  [place] at [phone].  Keep your follow-up appointments with all of your specialists (Cancer doctors, Pulmonary doctors, Primary Care Provider, Dietician, Physical Therapist, etc.)  Limit alcohol consumption or abstain from consuming alcohol all together.   Maintain adequate nutrition with plenty of fresh fruits & vegetables.       Thank you so much for allowing [facility] to care for you during your cancer experience.  Our continued commitment is to you and your caregiver(s) health and happiness and again, Congratulations!     Mike Craze, NP Survivorship Program [facility] [phone]                 ~For additional information, see attached resources.          Additional Resources for Lung Cancer Survivors  Survivors of cancer often report some of the following needs or concerns after they have completed treatment.  Below are some suggested interventions and resources to help guide you.  Just because your cancer treatment has ended, it does not mean that we stop helping you manage your needs or concerns.  Please let us know how we can best help you in your new life post-cancer and your return to health and wellness!       Common Needs/Concerns Suggested intervention(s)  Fatigue . This is the most common symptom experienced by lung cancer survivors. You are not alone!  . Regular physical activity - walking 20 minutes daily . Evaluation for hypothyroidism, anemia, sleep disturbance, or depression . For  more information, visit the National Cancer Institute's (NCI) website: http://www.kaiser.com/   Shortness of breath . Very common after lung cancer surgery due to reduced lung volume, as well as decreased physical activity. . Referral to Physical Therapy for improved endurance and stamina.  [place] booklet   Chronic cough or changes in your voice . The cough could be related to other airway diseases, like asthma or COPD, and those conditions should be treated to help with the cough.  . Could be related to acid reflux from the stomach.  You may need a medication to help with the acid which will help the cough.  . Over-the-counter cough suppressants can offer some relief.   . Sometimes, a short course of oral steroids can help suppress a cough.  Ask your doctor which treatment option may be best for you.   Chronic pain . About 50% of lung cancer survivors will experience some type of chronic pain, particularly after thoracotomy surgery.  . Communicate honestly with your doctors and providers about your pain so that we may help make the best recommendations for you.   Smoking cessation and/or maintaining your tobacco-free lifestyle . Referral for Smoking Cessation Counseling . Support Groups and Counseling . Medications to help with nicotine withdrawal symptoms. . Consider acupuncture or hypnosis.  . For more information and valuable resources, visit: smokefree.gov   Memory problems and/or confusion . About 25% of cancer patients have some degree of cognitive dysfunction (meaning memory problems, trouble concentrating, trouble finding the right word, etc.) after treatment.  This usually gets better over time.  . Some patients may need evaluation for sleep disturbances contributing to memory problems or depression.  . For more information, visit NCI's website at: ForwardDrop.tn   Change  in hearing . Certain chemotherapy drugs can cause hearing loss.  Most patients improve with time.  Marland Kitchen Referral to Audiologist for hearing evaluation and recommendations.   Peripheral neuropathy . Some chemotherapy drugs can  cause numbness/tingling in the hands and feet.  This usually improves with time and when chemotherapy is completed.  However, some patients may experience symptoms long after their chemotherapy has finished.  . Gabapentin, a prescription medication, may help.  . Certain antidepressant drugs have been shown to help with neuropathy pain.   . Over-the-counter capsaicin cream may help temporarily decrease the sensation of pain.   . Very rarely are strong pain medications needed for treatment of neuropathy.  . Visit NCI's website for more information: http://www.taylor.net/   Depression/General Anxiety  Consider counseling and/or initiation of pharmacotherapy  Referral to Social Worker   Referral to [place]. Call [phone] for details.  Finding Your New Normal [facility]) class through the [place].  Call [phone] for details.  Other resources: Nurse, learning disability.org, Call the [place] at [phone] for additional resources.  Insomnia or trouble sleeping  Practice good sleep habits  Discuss treatments of hot flashes  Consider treatment for anxiety  Read this from the [place] http://www.cancer.org/treatment/treatmentsandsideeffects/physicalsideeffects/fatigue/fatigueinpeoplewithcancer/fatigue-in-people-with-cancer-treating-fatigue  Weight loss/Loss of appetite  Dietary counseling with a Registered Dietician  Attend a healthy cooking class at [facility]. Call [phone] for details.  If your weight loss has been significant (greater than 15 pounds in 6 months or less), then call your doctor.  You need to be seen and evaluated.   Appetite stimulant medications  can help improve appetite and help you gain weight.   Eat high-calorie foods as often as you can tolerate.   Weight gain or overweight  ( BMI over 25.0 m  or weight gain of >15 lbs)   Dietary counseling with a Registered Dietician  Attend a healthy cooking class at [facility]. Call [phone] for details.  Referral to a weight management support group (e.g. Weight Watchers, Overeaters Anonymous)  Change in your eating habits, your taste, or your smell . Choose foods with tart flavors like lemon wedges, lemonade, citrus fruits, vinegar, and pickled foods.  . Add sweeteners or a little bit of sugar to foods. A little sweetness can help increase pleasant tastes. . Season foods with herbs/spices/or other seasonings like onion, garlic, chili powder, barbecue sauce, mustard, ketchup, mint, etc.  . If meats taste strange, marinate or cook meats in sweet juices, fruits, acidic dressings, or wine.  . If certain foods or drinks smell unpleasant while cooking, choose foods that do not need to be cooked, like cold sandwiches, crackers and cheese, yogurt and fruit, or cold cereal.  . Keep your mouth clean and healthy by rinsing and brushing your teeth after meals and before bed.   Decreased range of motion   Referral to Physical Therapy  Consider taking yoga or Tai Chi classes at the [facility].  Call [phone] for a schedule.  Intimacy and sexuality  Evaluation and treatment for anxiety, depression, as appropriate  Address body image concerns  Attend the American Cancer Society's Look Good.Feel Better program. Call [phone] for a schedule of when this event is offered.   Try a vaginal lubricant (Astroglide) or moisturizer (Replens- 1x every 3 days)  Consider attending support groups at [facility]. Call [phone] for details.  Attend the Finding Your New Normal [facility]) class at [facility]. Call [phone] for details.  Marital/partner/family relationships  Referral to Hydrographic surveyor Groups at the [employer]   Finding Your New Normal [facility]) class through the [place]. Call [phone] for details.  Stigma of lung cancer diagnosis   Do not blame  yourself for having cancer!  If you are experiencing anxiety or depression as a result of what others think about your diagnosis, then let us help you!  Referral to Education officer, museum or Counselor  Referral to [employer], health insurance, and/or finances  Referral to Education officer, museum  Concerns regarding spirituality, faith, coping, relating to God, loss of faith, facing my mortality, and/or loss of my sense of purpose  Referral to Chaplain   Referral to [employer] activities: www.hirschwellnessnetwork.org  Support Groups at the [facility]. Call [phone] for details.  Finding Your New Normal [facility]) class through [employer] [facility].  Call [phone] for details.  Returning to an exercise program  Consider joining the [facility], a 12-week program that meets twice a week for 90 minutes using traditional exercise methods to ease you back into fitness and help you maintain a healthy weight.   This program is FREE for you and a friend.  Offered at several local [place].    Contact Elaina Hoops at [phone] or [email or web address]  Consider joining [place] or Yoga at [facility]. Call [phone] for a schedule.

## 2016-02-10 ENCOUNTER — Ambulatory Visit (HOSPITAL_BASED_OUTPATIENT_CLINIC_OR_DEPARTMENT_OTHER): Payer: Medicare Other | Admitting: Adult Health

## 2016-02-10 ENCOUNTER — Encounter: Payer: Self-pay | Admitting: Adult Health

## 2016-02-10 VITALS — BP 114/67 | HR 84 | Temp 98.3°F | Resp 18 | Ht 59.0 in | Wt 95.1 lb

## 2016-02-10 DIAGNOSIS — R05 Cough: Secondary | ICD-10-CM

## 2016-02-10 DIAGNOSIS — C3491 Malignant neoplasm of unspecified part of right bronchus or lung: Secondary | ICD-10-CM

## 2016-02-10 DIAGNOSIS — Z720 Tobacco use: Secondary | ICD-10-CM

## 2016-02-10 DIAGNOSIS — Z91041 Radiographic dye allergy status: Secondary | ICD-10-CM

## 2016-02-10 DIAGNOSIS — C3411 Malignant neoplasm of upper lobe, right bronchus or lung: Secondary | ICD-10-CM | POA: Diagnosis not present

## 2016-02-10 MED ORDER — PREDNISONE 20 MG PO TABS: 20.0000 mg | ORAL_TABLET | Freq: Every day | ORAL | 0 refills | Status: DC

## 2016-02-10 MED ORDER — PREDNISONE 10 MG (21) PO TBPK: 20.0000 mg | ORAL_TABLET | Freq: Every morning | ORAL | Status: DC

## 2016-02-10 NOTE — Patient Instructions (Signed)
Thank you so much for coming in to see me today! I enjoyed meeting you and your husband.   Please feel free to call me with any questions or concerns!  I called in your prednisone taper to your [place].  You will take 3 tabs (60 mg) the day of the scan, 2 tabs (40 mg) the next day, and 1 tab (20 mg) on the 3rd day before stopping the medication.

## 2016-02-10 NOTE — Progress Notes (Signed)
CLINIC:  Survivorship  REASON FOR VISIT:  Survivorship Care Plan visit & to address acute survivorship needs   BRIEF ONCOLOGY HISTORY: Oncology History   Patient presented with left sided CP.  Work up showed lung mass.   Non-small cell cancer of right lung [facility])   Staging form: Lung, AJCC 7th Edition     Clinical stage from 08/27/2015: Stage IIIA (T2a, N2, M0) - Signed by Curt Bears, MD on 08/27/2015       Non-small cell cancer of right lung ([place])   06/21/2015 Imaging    CT Chest There is a 3.7 x 4.5 x 4.0 cm sub solid pulmonary mass versus area of airspace consolidation in the right upper lobe. Spiculations are seen extending to the anterior and lateral pleural surfaces.      06/25/2015 PET scan    Malignant range FDG uptake associated with mass-like density in RUL. No evidence of mediastinal or hilar lymph node mets and no distant metastatic disease.       07/15/2015 Initial Diagnosis    Non-small cell lung cancer ([place]) Right upper lobe, diagnosed by navigational bronchoscopy      07/15/2015 Surgery    Operation: Flexible video fiberoptic bronchoscopy with electromagnetic navigation and biopsies      07/30/2015 Surgery    PROCEDURE:  Right video-assisted thoracoscopy Thoracoscopic right upper lobectomy with bronchoplastic closure and en bloc resection of a wedge from the superior segment of the right lower lobe Mediastinal lymph node dissection.      07/30/2015 Pathology Results    RUL: Well-diff adenocarcinoma of lung, 4 cm, invades visceral pleura, LVI(+), negative margins.  Dissected lymph nodes from level 7, 10R, 12R, and 4R showed evidence for metastatic adenocarcinoma (8/13 LNs positive).    pT2a, pN2: Stage IIIA       08/13/2015 Imaging    MRI Brain No abnormal enhancement to suggest metastatic disease to the brain or meninges. No osseous lesions are identified.      09/10/2015 - 11/12/2015 Adjuvant Chemotherapy    Cisplatin/Pemetrexed x 4 cycles       11/25/2015  Imaging    CT chest: Interval RUL lobectomy. Tiny nonspecific bilat lower lobe nodules. Continued attention on f/u recommended.       11/30/2015 - 12/28/2015 Radiation Therapy    Adjuvant radiation Tammi Klippel): The primary tumor and involved mediastinal adenopathy were treated to 50.4 Gy in 28 fractions of 1.8 Gy.        INTERVAL HISTORY:  Ms. Kniskern presents to the survivorship clinic for her Survivorship Care Plan visit and to address any acute concerns since completing treatment. She finished radiation about 1 month ago.     She has a nagging cough; she was given a Z-pak by her PCP which helped some.  The radiation-induced esophagitis/heartburn has resolved.  She emailed Dr. Worthy Flank office some questions about getting a flu shot; she tells me that she did get her flu shot yesterday.  She has post-nasal drip, which has been chronic as she has seasonal allergies.  She tells me that she and her husband are going on a [place] cruise at the end of September and she is requesting a prescription for antibiotics to take "just in case."    She is also requesting prednisone to be taken before her CT chest in a few weeks given her contrast media allergy.    Overall, she is doing well. She endorses fear of cancer recurrence and has questions about her follow-up schedule.  We will review  that today with her Survivorship Care Plan.    ADDITIONAL REVIEW OF SYSTEMS:  Review of Systems  Constitutional: Negative.   HENT:       Post-nasal drip  Eyes: Negative.   Respiratory: Positive for cough.   Cardiovascular: Negative.   Gastrointestinal: Negative.  Negative for constipation, diarrhea and heartburn.  Genitourinary: Negative.   Musculoskeletal: Negative.   Skin: Negative.   Neurological: Negative.   Endo/Heme/Allergies: Negative.     PAST MEDICAL & SURGICAL HISTORY:  Past Medical History:  Diagnosis Date  . Anxiety   . Arthritis    shoulders  . Complication of anesthesia    nausea  .  Cough 09/17/2015  . Encounter for antineoplastic chemotherapy 10/22/2015  . Environmental and seasonal allergies   . Family history of adverse reaction to anesthesia    Patients grandmother died while having hand surgery; pt unsure of cause  . Oral thrush 10/22/2015  . Ovarian cyst   . PONV (postoperative nausea and vomiting)    Past Surgical History:  Procedure Laterality Date  . CESAREAN SECTION  1984  . COLONOSCOPY WITH PROPOFOL N/A 09/27/2012   Procedure: COLONOSCOPY WITH PROPOFOL;  Surgeon: Garlan Fair, MD;  Location: [facility];  Service: Endoscopy;  Laterality: N/A;  . Fibroid Removed    . Right Rotator Cuff Right   . VIDEO ASSISTED THORACOSCOPY (VATS)/ LOBECTOMY Right 07/30/2015   Procedure: RIGHT VIDEO ASSISTED THORACOSCOPY (VATS)/RIGHT UPPER LOBECTOMY;  Surgeon: Melrose Nakayama, MD;  Location: [facility];  Service: Thoracic;  Laterality: Right;  Marland Kitchen VIDEO BRONCHOSCOPY WITH ENDOBRONCHIAL NAVIGATION N/A 07/15/2015   Procedure: VIDEO BRONCHOSCOPY WITH ENDOBRONCHIAL NAVIGATION with Biopsy;  Surgeon: Collene Gobble, MD;  Location: [place];  Service: Thoracic;  Laterality: N/A;    SOCIAL HISTORY: Ms. Schall is married and lives with her husband in [place], [place].  She has 2 children, 1 son and 1 daughter.  She works at [employer].  She denies any current or history of tobacco or illicit drug use.  She drinks 1 glass of wine per day.     CURRENT MEDICATIONS:  Current Outpatient Prescriptions on File Prior to Visit  Medication Sig Dispense Refill  . acetaminophen (TYLENOL) 500 MG tablet Take 2 tablets (1,000 mg total) by mouth every 6 (six) hours as needed. 30 tablet 0  . AMBULATORY NON FORMULARY MEDICATION Medication Name: MAGIC MOUTHWASH 2 % Viscous Lidocaine  Maalox Benadryl Susp.  Disp: 1:1:1 211m bottle Sig: 167mPO Swish & Spit  q 3-4hrs. PRN mouth sores (Patient not taking: Reported on 01/01/2016) 200 mL 3  . Azelaic Acid (FINACEA) 15 % cream Apply 1 application topically  2 (two) times daily. After skin is thoroughly washed and patted dry, gently but thoroughly massage a thin film of azelaic acid cream into the affected area twice daily, in the morning and evening.    . Marland Kitchenzithromycin (ZITHROMAX) 250 MG tablet Take two tabs po on day one, then one po daily until completed (Patient not taking: Reported on 01/01/2016) 6 each 0  . benzonatate (TESSALON) 100 MG capsule Take 1-2 capsules (100-200 mg total) by mouth 3 (three) times daily. 30 capsule 0  . Brimonidine Tartrate (MIRVASO) 0.33 % GEL Apply 1 application topically daily.    . cholecalciferol (VITAMIN D) 1000 UNITS tablet Take 1,000 Units by mouth daily.    . citalopram (CELEXA) 10 MG tablet Take 10 mg by mouth every morning.    . Marland Kitchenexamethasone (DECADRON) 4 MG tablet 4 mg by mouth twice a  day the day before, day of and day after the chemotherapy every 3 weeks (Patient not taking: Reported on 12/01/2015) 40 tablet 1  . fexofenadine (ALLEGRA) 180 MG tablet Take 180 mg by mouth daily.    . folic acid (FOLVITE) 1 MG tablet Take 1 tablet (1 mg total) by mouth daily. (Patient not taking: Reported on 01/01/2016) 30 tablet 4  . loratadine (CLARITIN) 10 MG tablet Take 1 tablet (10 mg total) by mouth daily as needed for allergies. (Patient not taking: Reported on 12/10/2015)  0  . LORazepam (ATIVAN) 0.5 MG tablet Take 0.5 mg by mouth as needed. Reported on 10/07/2015    . Multiple Vitamin (MULTIVITAMIN WITH MINERALS) TABS Take 1 tablet by mouth daily.    Marland Kitchen nystatin (MYCOSTATIN) 100000 UNITS/ML SUSP Take 1 mL by mouth every 6 (six) hours. (Patient not taking: Reported on 12/10/2015) 60 mL 0  . omeprazole (PRILOSEC) 20 MG capsule Take 20 mg by mouth 2 (two) times daily before a meal.    . polyethylene glycol powder (MIRALAX) powder Take 255 g by mouth once. 255 g 0  . prochlorperazine (COMPAZINE) 10 MG tablet Take 1 tablet (10 mg total) by mouth every 6 (six) hours as needed for nausea or vomiting. (Patient not taking: Reported on  12/01/2015) 30 tablet 0  . Pseudoephedrine HCl (SUPHEDRINE PO) Take by mouth.    . Wound Cleansers (RADIAPLEX EX) Apply topically.    Marland Kitchen zolpidem (AMBIEN) 5 MG tablet Take 5 mg by mouth at bedtime as needed for sleep. Reported on 01/01/2016     No current facility-administered medications on file prior to visit.     ALLERGIES: Allergies  Allergen Reactions  . Avelox [Moxifloxacin Hcl In Nacl] Anaphylaxis  . Cephalosporins Anaphylaxis  . Clindamycin/Lincomycin Anaphylaxis  . Penicillins Anaphylaxis    Has patient had a PCN reaction causing immediate rash, facial/tongue/throat swelling, SOB or lightheadedness with hypotension: Yes Has patient had a PCN reaction causing severe rash involving mucus membranes or skin necrosis: No Has patient had a PCN reaction that required hospitalization Yes Has patient had a PCN reaction occurring within the last 10 years: No If all of the above answers are "NO", then may proceed with Cephalosporin use.   Marland Kitchen Macrobid [Nitrofurantoin Monohyd Macro] Other (See Comments)    CHEST TIGHTNESS...Marland KitchenHAD USED BEFORE WITH NO REACTION  . Omnipaque [Iohexol] Hives  . Adhesive [Tape] Rash  . Doxycycline Other (See Comments)    Frequently urination   . Lidoderm [Lidocaine] Rash  . Pseudoephedrine Palpitations  . Sulfa Antibiotics Rash     PHYSICAL EXAM:  Vitals:   02/10/16 0950  BP: 114/67  Pulse: 84  Resp: 18  Temp: 98.3 F (36.8 C)   Filed Weights   02/10/16 0950  Weight: 95 lb 1.6 oz (43.1 kg)    General: Female in no acute distress.  Accompanied by her husband.    HEENT: Head is atraumatic and normocephalic.  Pupils equal and reactive to light. Conjunctivae clear without exudate.  Sclerae anicteric. Oral mucosa is pink and moist without lesions. Oropharynx is pink and moist, without lesions. Lymph: No cervical, supraclavicular, or infraclavicular lymphadenopathy noted on palpation.   Cardiovascular: Normal rate and rhythm Respiratory: Clear to  auscultation. Chest expansion symmetric without accessory muscle use. Breathing non-labored.    GU: Deferred.   Neuro: No focal deficits. Steady gait.   Psych: Normal mood and affect for situation. Extremities: No edema, cyanosis, or clubbing.   Skin: Warm and dry.  Mild hyperpigmentation  to upper back with dry skin to radiation treatment fields.     LABORATORY DATA:  None for this visit.   DIAGNOSTIC IMAGING:  None for this visit.    ASSESSMENT & PLAN:  Ms. [name] West is a pleasant 65 y.o. female with history of Stage IIIA (T2,N2a,M0), treated with right upper lobectomy and mediastinal LND, followed by adjuvant chemotherapy with Cisplatin/Pemetrexed x 4 cycles (last cycle6/1/17), then adjuvant radiation therapy; completed treatment on 01/07/16.  Patient presents to survivorship clinic today for survivorship care plan visit and to address any acute survivorship concerns since completing treatment.   1. Stage IIIA lung cancer: Today, she received a copy of his [facility]) document, which was reviewed with her in detail.  The SCP details her cancer treatment history and potential late/long-term side effects of those treatments.  We discussed the follow-up schedule she can anticipate with interval imaging for surveillance of her cancer.  I have also shared a copy of his treatment summary/SCP with his PCP.  She will follow-up with Dr. Tammi Klippel on 02/18/16.  Her first CT chest post-treatment will be 03/01/16; she has a contrast allergy, so I prescribed a short prednisone taper for her of 60 mg on day of scan, 40 mg the day after, 20 mg on day 3, then stop (prescription called in to [facility] on [address]).  She will see Dr. Julien Nordmann on 03/07/16 and Dr. Roxan Hockey on 07/26/16.   2. Persistent cough: We discussed that a persistent cough after both lobectomy and subsequent radiation therapy is very common.  Thus far it is manageable, but annoying for her.  She does have some post-nasal drip  associated with her seasonal allergies. She takes Allegra for this; cannot take Flonase due to nose bleeds.  I encouraged her to switch anti-histamines and try OTC Zyrtec to see if it may provide her better relief.  While they are in the same class of medication, often switching to a new anti-histmine may provide patients with additional relief of allergy symptoms.   3. Post-radiation skin changes: She has some mild hyperpigmentation and dry skin to upper back s/p radiation therapy.  Otherwise, her skin has healed very well.  I encouraged her to apply lotion with Vitamin E or vitamin E oil to the dry skin in the affected treatment areas.  This will help continue to provide moisture and healing to the treated areas.   4. Request for prophylactic antibiotics for overseas travel: Ms. Alsteen requested a prescription for antibiotics to be taken with her on her cruise to [place] "just in case."  I recommended that she contact her PCP for this prescription, as her immune system has recovered from a cancer treatment standpoint and any prophylactic antibiotics would be best from her PCP. She voiced understanding and agreed with this plan.   5. Smoking cessation: I commended Ms. Dejager's continued efforts to remain tobacco-free.  We discussed that one of the most important risk reduction strategies in preventing cancer recurrence in lung cancer patients is smoking cessation.  She is committed to abstaining from tobacco.    6. Physical activity/Healthy eating: Getting adequate physical activity and maintaining a healthy diet as a cancer survivor is important for overall wellness and reduces the risk of cancer recurrence. We discussed the [facility], which is a fitness program that is offered to cancer survivors free of charge.  We also reviewed "The Nutrition Rainbow" handout, as well as the American Cancer Society's booklet with recommendations for nutrition and  physical activity.    7. Health  promotion/Cancer screening:   I encouraged her to talk with his PCP about arranging appropriate cancer screening tests and vaccinations, as appropriate.   8. Support services/Counseling: It is not uncommon for this period of the patient's cancer care trajectory to be one of many emotions and stressors.  Ms. Soutar was encouraged to take advantage of our many support services programs, support groups, and/or counseling in coping with her new life as a cancer survivor after completing anti-cancer treatment. He was given a calendar of the cancer center's support services events, as well as brochures for our spiritual care and free counseling resources.  She is also interested in becoming a volunteer here at the cancer center, and I will help find out the information she needs to move forward with that.    Dispo:  -Return to cancer center to see Dr. Tammi Klippel on 02/18/16 -CT chest on 03/01/16 (prednisone prep ordered today) -See Dr. Julien Nordmann on 03/07/16 -See Dr. Roxan Hockey on 07/26/16 -Return to survivorship clinic as needed; no additional follow-up needed at this time.  -Consider transitioning the patient to long-term survivorship, when clinically appropriate.   A total of 50 minutes was spent in face-to-face care of this patient, with greater than 50% of that time spent in counseling and care coordination.   Mike Craze, NP [place] [phone]

## 2016-02-12 ENCOUNTER — Encounter: Payer: BC Managed Care – PPO | Admitting: Adult Health

## 2016-02-18 ENCOUNTER — Ambulatory Visit
Admission: RE | Admit: 2016-02-18 | Discharge: 2016-02-18 | Disposition: A | Discharge status: Home / Self Care | Payer: Medicare Other | Source: Ambulatory Visit | Attending: Radiation Oncology | Admitting: Radiation Oncology

## 2016-02-18 ENCOUNTER — Encounter: Payer: Self-pay | Admitting: Radiation Oncology

## 2016-02-18 VITALS — BP 119/76 | HR 76 | Resp 16 | Wt 93.0 lb

## 2016-02-18 DIAGNOSIS — C3491 Malignant neoplasm of unspecified part of right bronchus or lung: Secondary | ICD-10-CM | POA: Insufficient documentation

## 2016-02-18 DIAGNOSIS — Z880 Allergy status to penicillin: Secondary | ICD-10-CM | POA: Diagnosis not present

## 2016-02-18 DIAGNOSIS — Y842 Radiological procedure and radiotherapy as the cause of abnormal reaction of the patient, or of later complication, without mention of misadventure at the time of the procedure: Secondary | ICD-10-CM | POA: Insufficient documentation

## 2016-02-18 DIAGNOSIS — R05 Cough: Secondary | ICD-10-CM | POA: Diagnosis not present

## 2016-02-18 NOTE — Progress Notes (Signed)
Radiation Oncology         [phone]) [phone] ________________________________  Name: [name] West MRN: [id number]  Date: 02/18/2016  DOB: 03-20-1951    Follow-Up Visit Note  CC: [name] Caraway, MD  Melrose Nakayama, *  Diagnosis:   65 y.o. 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 Non-small cell cancer of right lung ([place]) 162.9 C34.91     Interval Since Last Radiation:  6 weeks (11/30/2015 to 01/07/2016)  Narrative:  The patient returns today for routine follow-up.  Weight and vitals stable. Reports she had her flu shot and will have her second pneumonia shot in October. She will be going on a cruise at the end of the month. She is scheduled for lab and CT Chest on 03/01/2016. Scheduled to follow up with Dr. Julien Nordmann on 03/07/2016.   On review of systems, the patient reports that she is doing well overall. She denies any chest pain, fevers, chills, night sweats, unintended weight changes. She reports frequency of dry cough is less but, irritated throat from coughing continues. She denies pain associated with swallowing. She reports shortness of breath with great exertion (at the end of 2-3 mile walks). She did not experience SOB while walking in [place] with her family this may be associated with decreased incline and humidity. She denies any bowel or bladder disturbances, and denies abdominal pain, nausea or vomiting. She denies any new musculoskeletal or joint aches or pains. Reports she continues to use Radiaplex on affected skin. A complete review of systems is obtained and is otherwise negative.                               ALLERGIES:  is allergic to avelox [moxifloxacin hcl in nacl]; cephalosporins; clindamycin/lincomycin; penicillins; macrobid [nitrofurantoin monohyd macro]; omnipaque [iohexol]; adhesive [tape]; doxycycline; lidoderm [lidocaine]; pseudoephedrine; and sulfa antibiotics.  Meds: Current Outpatient Prescriptions    Medication Sig Dispense Refill  . acetaminophen (TYLENOL) 500 MG tablet Take 2 tablets (1,000 mg total) by mouth every 6 (six) hours as needed. 30 tablet 0  . Azelaic Acid (FINACEA) 15 % cream Apply 1 application topically 2 (two) times daily. After skin is thoroughly washed and patted dry, gently but thoroughly massage a thin film of azelaic acid cream into the affected area twice daily, in the morning and evening.    . Brimonidine Tartrate (MIRVASO) 0.33 % GEL Apply 1 application topically daily.    . cholecalciferol (VITAMIN D) 1000 UNITS tablet Take 1,000 Units by mouth daily.    . citalopram (CELEXA) 10 MG tablet Take 10 mg by mouth every morning.    . fexofenadine (ALLEGRA) 180 MG tablet Take 180 mg by mouth daily.    Marland Kitchen LORazepam (ATIVAN) 0.5 MG tablet Take 0.5 mg by mouth as needed. Reported on 10/07/2015    . Multiple Vitamin (MULTIVITAMIN WITH MINERALS) TABS Take 1 tablet by mouth daily.    . polyethylene glycol powder (MIRALAX) powder Take 255 g by mouth once. 255 g 0  . predniSONE (DELTASONE) 20 MG tablet Take 1 tablet (20 mg total) by mouth daily with breakfast. Take 3 tabs (60 mg) on day of CT scan. Take 2 tabs (40 mg) day after scan. Take 1 tab (20 mg) on day 3, then stop. 6 tablet 0  . Pseudoephedrine HCl (SUPHEDRINE PO) Take by mouth.    . Wound Cleansers (RADIAPLEX EX)  Apply topically.    Marland Kitchen zolpidem (AMBIEN) 5 MG tablet Take 5 mg by mouth at bedtime as needed for sleep. Reported on 01/01/2016     No current facility-administered medications for this encounter.     Physical Findings:  weight is 93 lb (42.2 kg). Her blood pressure is 119/76 and her pulse is 76. Her respiration is 16 and oxygen saturation is 100%. .   In general this is a well appearing caucasian female in no acute distress. She's alert and oriented x4 and appropriate throughout the examination. Cardiopulmonary assessment is negative for acute distress and she exhibits normal effort.    Lab Findings: Lab  Results  Component Value Date   WBC 4.4 12/01/2015   WBC 16.8 (H) 08/14/2015   HGB 13.2 12/01/2015   HCT 38.9 12/01/2015   PLT 219 12/01/2015    Lab Results  Component Value Date   NA 138 12/01/2015   K 4.4 12/01/2015   CHLORIDE 102 12/01/2015   CO2 27 12/01/2015   GLUCOSE 93 12/01/2015   BUN 19.0 12/01/2015   CREATININE 0.9 12/01/2015   BILITOT 0.45 12/01/2015   ALKPHOS 54 12/01/2015   AST 19 12/01/2015   ALT 16 12/01/2015   PROT 6.4 12/01/2015   ALBUMIN 3.7 12/01/2015   CALCIUM 9.5 12/01/2015   ANIONGAP 9 12/01/2015   ANIONGAP 14 08/14/2015    Radiographic Findings: No results found.  Impression:  The patient is recovering from the effects of radiation.  The patient does have a low level cough.  Plan:  CT Chest on 03/01/2016. Follow up with medical oncology, Dr. Julien Nordmann, on 03/07/2016. The patient will continue to follow with medical oncology for ongoing surveillance and will follow with radiation oncology on an as needed basis.   I talked to the patient about her cough. If it progresses, we talked about some of the implications of radiation pneumonitis.  _____________________________________  Sheral Apley. Tammi Klippel, M.D.  This document serves as a record of services personally performed by Tyler Pita, MD. It was created on his behalf by Arlyce Harman, a trained medical scribe. The creation of this record is based on the scribe's personal observations and the provider's statements to them. This document has been checked and approved by the attending provider.

## 2016-02-18 NOTE — Addendum Note (Signed)
Encounter addended by: Heywood Footman, RN on: 02/18/2016  3:30 PM<BR>    Actions taken: Charge Capture section accepted

## 2016-02-18 NOTE — Progress Notes (Signed)
Weight and vitals stable. Reports she had her flu shot and will have her second pneumonia shot in October. Scheduled for lab and CT chest on 9/19. Scheduled to follow up with [facility] on 9/25. Mild peeling of skin noted right upper back. Reports she continues to use Radiaplex on affected skin. Denies fatigue. Reports frequency of dry cough is less but, irritated throat from coughing continues. Denies pain associated with swallowing. Reports SOB only with great exertion (at the end of 2-3 mile walks).    BP 119/76 (BP Location: Left Arm, Patient Position: Sitting, Cuff Size: Small)   Pulse 76   Resp 16   Wt 93 lb (42.2 kg)   SpO2 100%   BMI 18.78 kg/m  Wt Readings from Last 3 Encounters:  02/18/16 93 lb (42.2 kg)  02/10/16 95 lb 1.6 oz (43.1 kg)  01/01/16 94 lb 11.2 oz (43 kg)

## 2016-02-23 ENCOUNTER — Ambulatory Visit: Payer: Medicare Other | Admitting: Radiation Oncology

## 2016-02-25 IMAGING — CR DG CHEST 1V PORT
1 series · 1 of 1 positions shown · non-contrast
Comparison: Prior chest x-ray 07/31/2015

CLINICAL DATA: 64-year-old female status post right upper lobectomy

EXAM:
PORTABLE CHEST 1 VIEW

[AP]
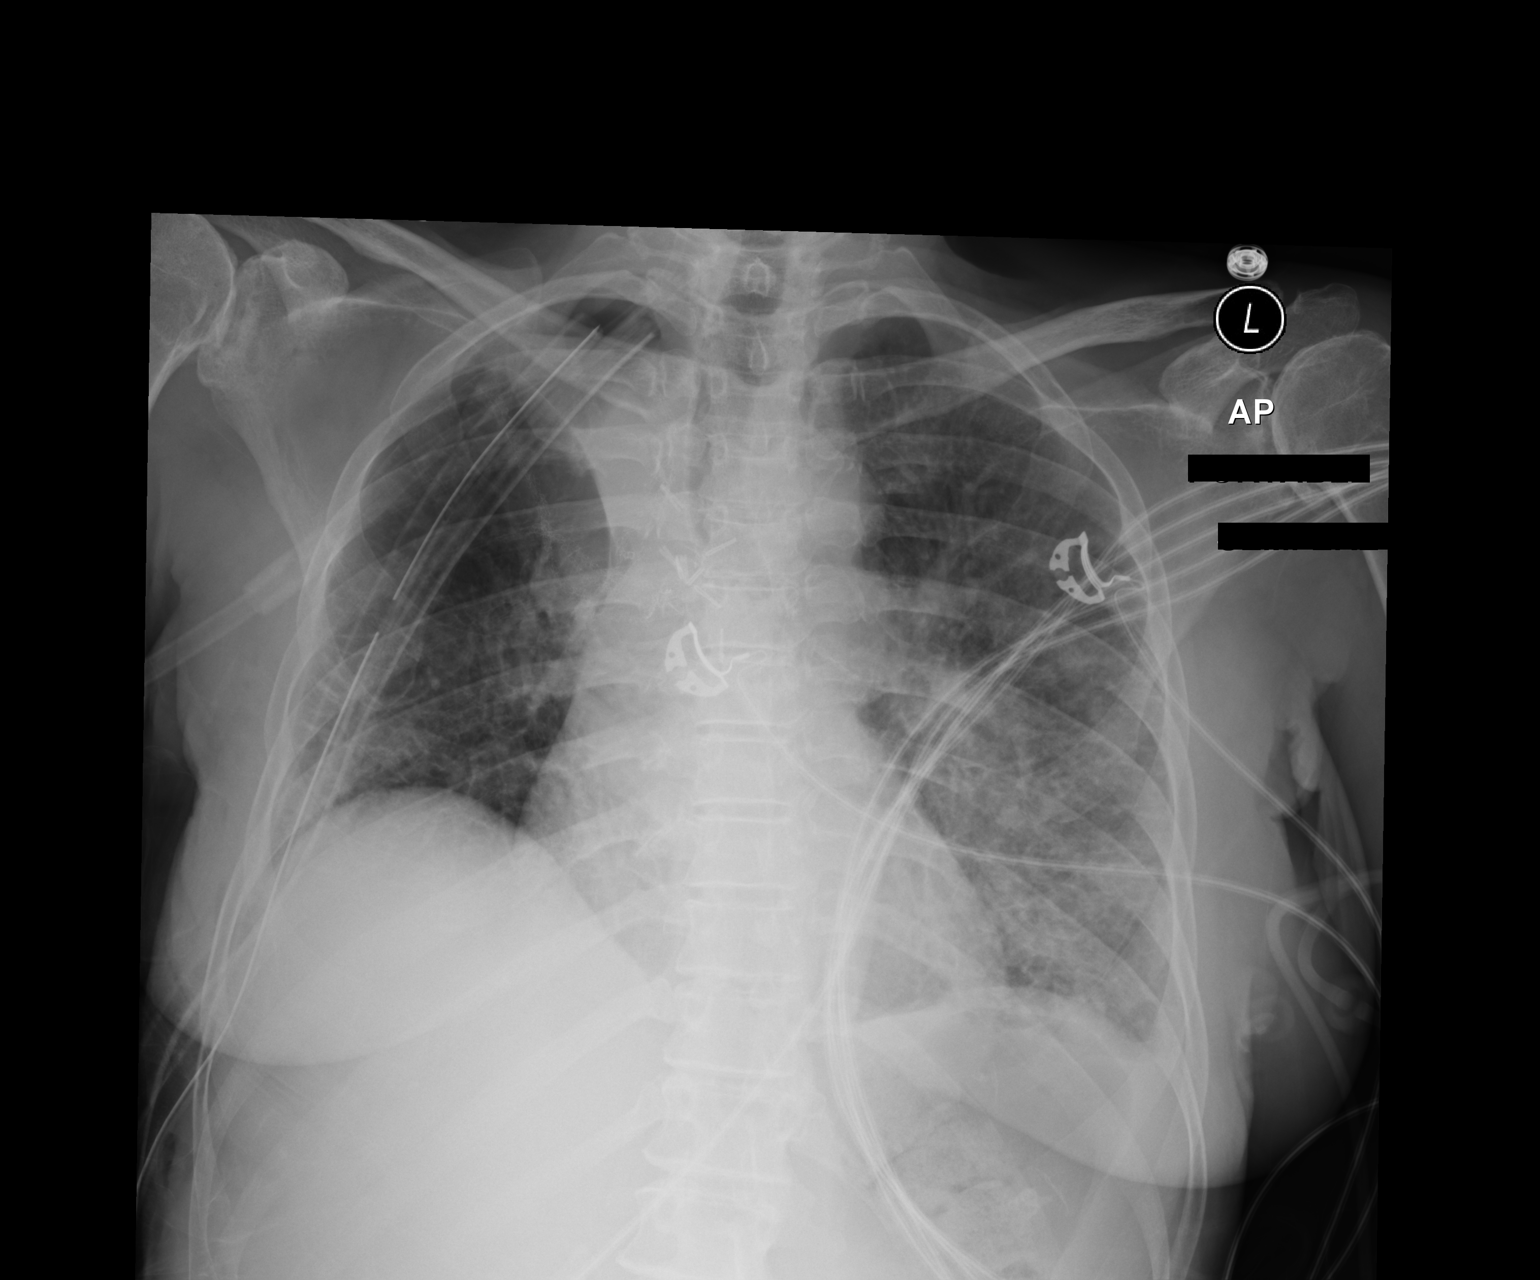

[1 of 1 positions shown; findings below may reference images not displayed]

FINDINGS: Stable position of right-sided chest tubes. Surgical changes of
right upper lobectomy. Persistent right apical hydro pneumothorax
without significant interval change. Slightly increased patchy
airspace opacity in the lung bases in the setting of decreased
inspiratory volumes favored to reflect atelectasis. Cardiac and
mediastinal contours are unchanged. No acute osseous abnormality.
IMPRESSION: 1. Slightly lower inspiratory volumes with increasing left greater
than right patchy basilar airspace opacities. Atelectasis is favored
although developing infiltrate in the left base is difficult to
exclude radiographically.
2. Stable right apical hydropneumothorax with 2 chest tubes in
place.

## 2016-02-26 IMAGING — CR DG CHEST 1V PORT
1 series · 1 of 1 positions shown · non-contrast
Comparison: Chest radiograph from one day prior.

CLINICAL DATA: Chest tube removal

EXAM:
PORTABLE CHEST 1 VIEW

[AP]
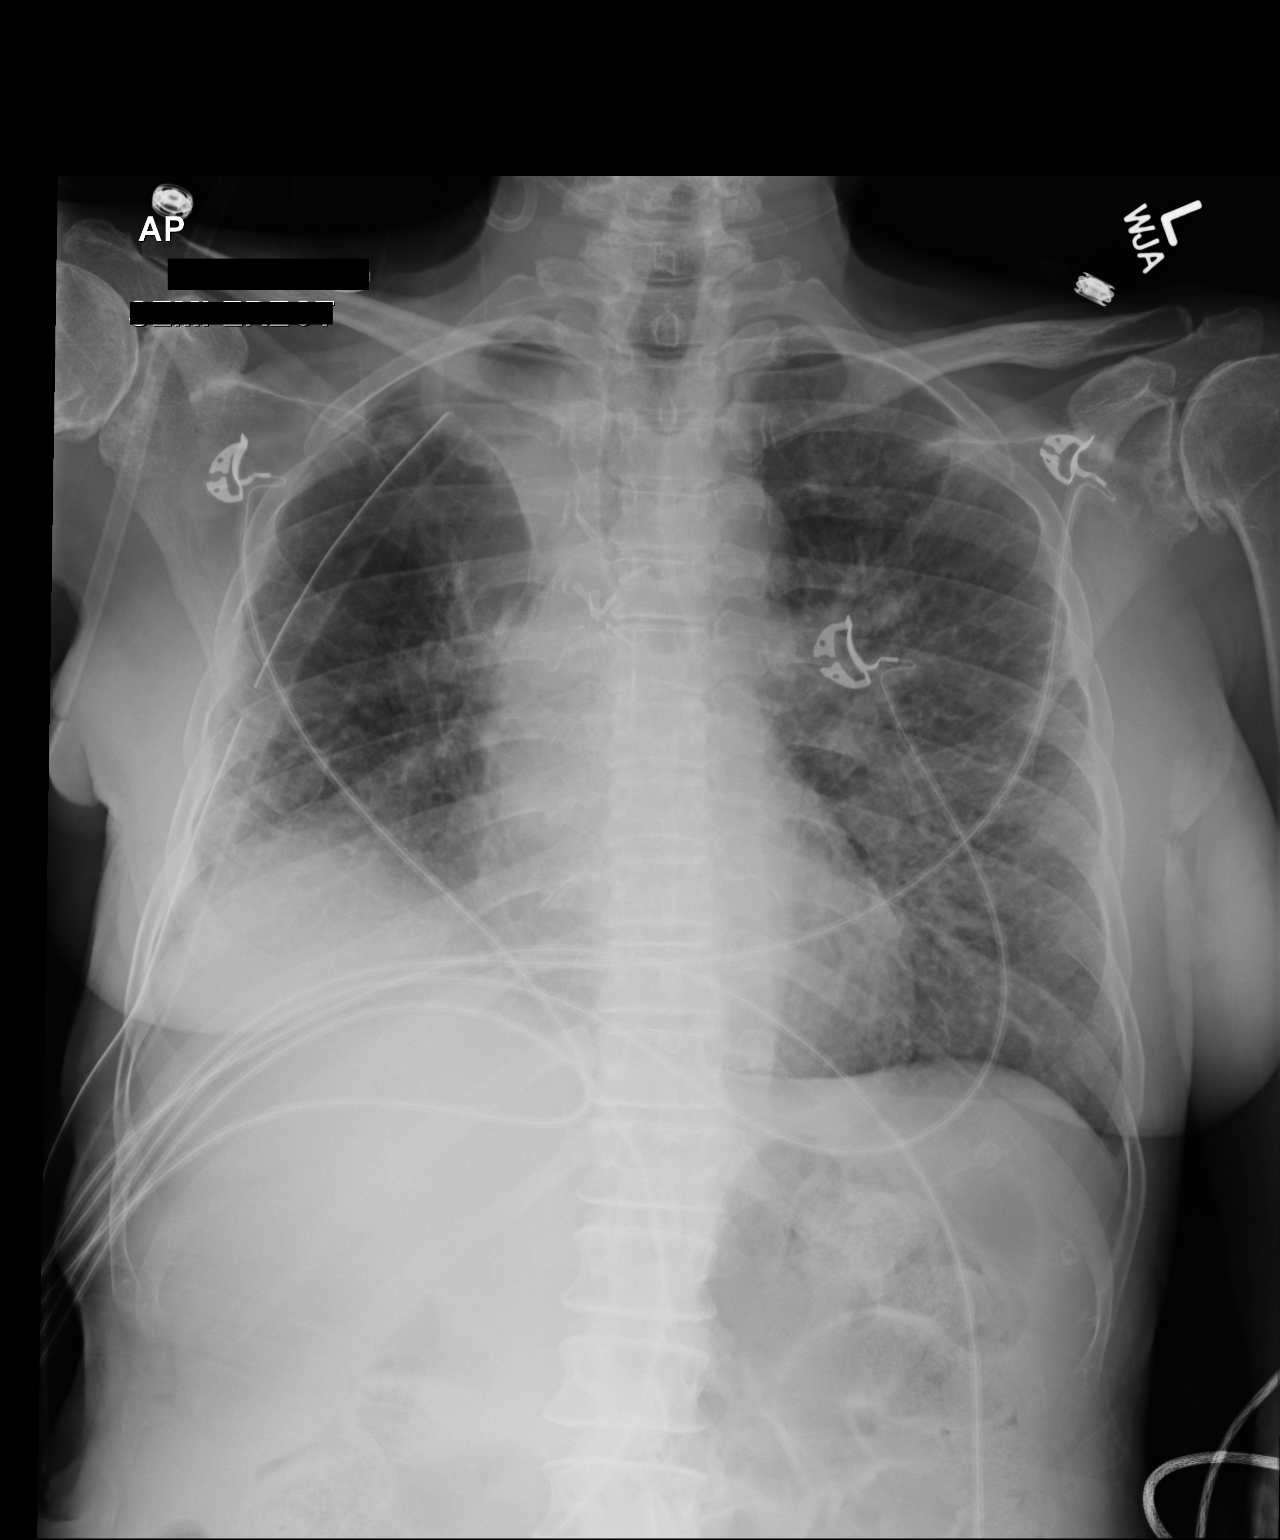

[1 of 1 positions shown; findings below may reference images not displayed]

FINDINGS: There is a single remaining right apical chest tube, which has been
slightly retracted in the interval. Stable cardiomediastinal
silhouette with normal heart size. Small right apical pneumothorax
appears stable, approximately 10%. No left pneumothorax. No pleural
effusion. Surgical clips and sutures overlie the right hilum. Stable
volume loss in the right hemithorax with slight right mediastinal
shift. Stable reticular opacities throughout both lungs, not
appreciably changed.
IMPRESSION: 1. Stable small right apical pneumothorax.
2. Stable postsurgical changes in the right hemithorax and diffuse
reticular opacities throughout both lungs probably representing
atelectasis.

## 2016-02-27 IMAGING — CR DG CHEST 1V PORT
1 series · 1 of 1 positions shown · non-contrast
Comparison: Portable chest x-ray August 02, 2015

CLINICAL DATA: Status post right upper lobectomy for small cell
lung malignancy

EXAM:
PORTABLE CHEST 1 VIEW

[AP]
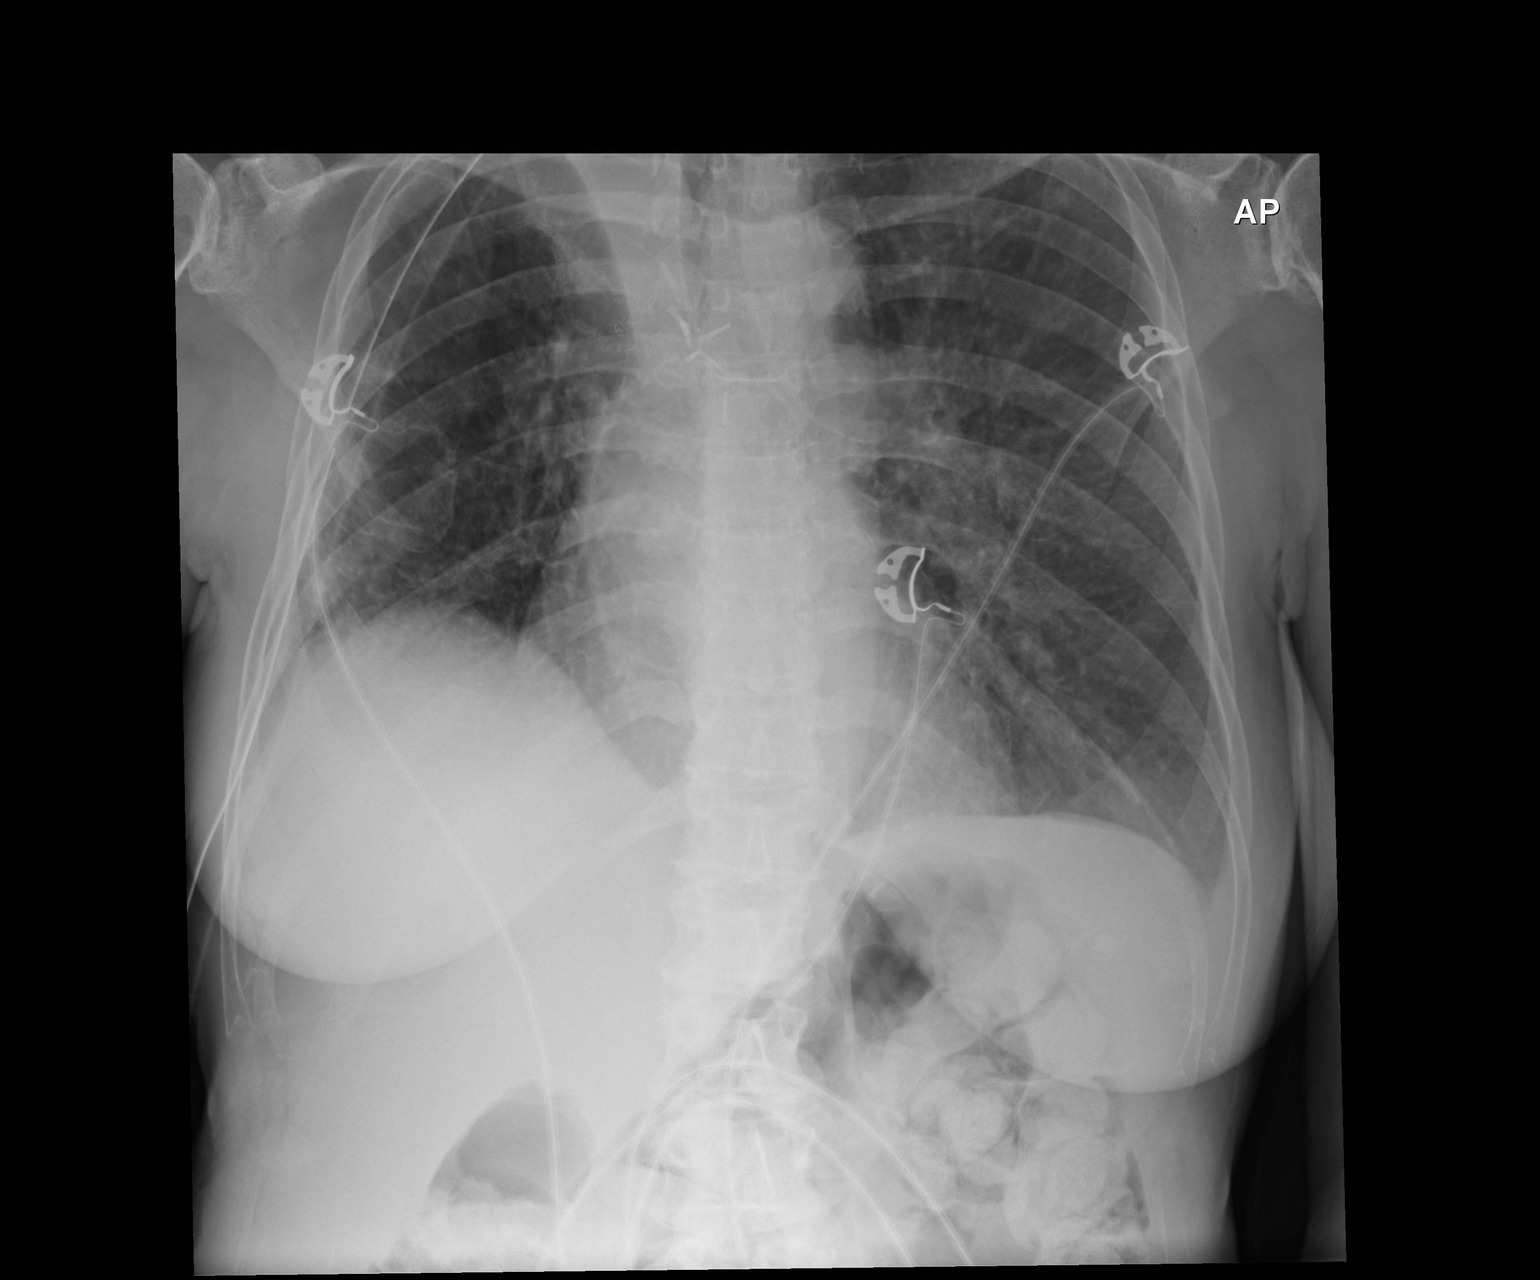

[1 of 1 positions shown; findings below may reference images not displayed]

FINDINGS: There is mild volume loss on the right since upper lobectomy. Soft
tissue density material persists in the right paratracheal region
since lobectomy. A tiny 5% or less apical pneumothorax persists. The
right chest tube tip projects over the interspace between the
posterior aspects of the third and fourth ribs and is stable. There
is no significant pleural fluid at the lung base. There is mild
shift of the mediastinum toward the left. The interstitial markings
within the aerated portions of both lungs remain mildly increased.
The heart is normal in size. There is mild central pulmonary
vascular prominence.
IMPRESSION: Stable 5% or less right apical pneumothorax. Stable mild shift of
the mediastinum toward the right. Persistent mild interstitial
density in both lungs.

## 2016-02-28 IMAGING — DX DG CHEST 2V
2 series · 2 of 2 positions shown · non-contrast
Comparison: Portable chest x-ray dated August 03, 2015
immediately following chest tube removal.

CLINICAL DATA: Chest tube removal yesterday with persistent chest
soreness.

EXAM:
CHEST  2 VIEW

[w chest pa]
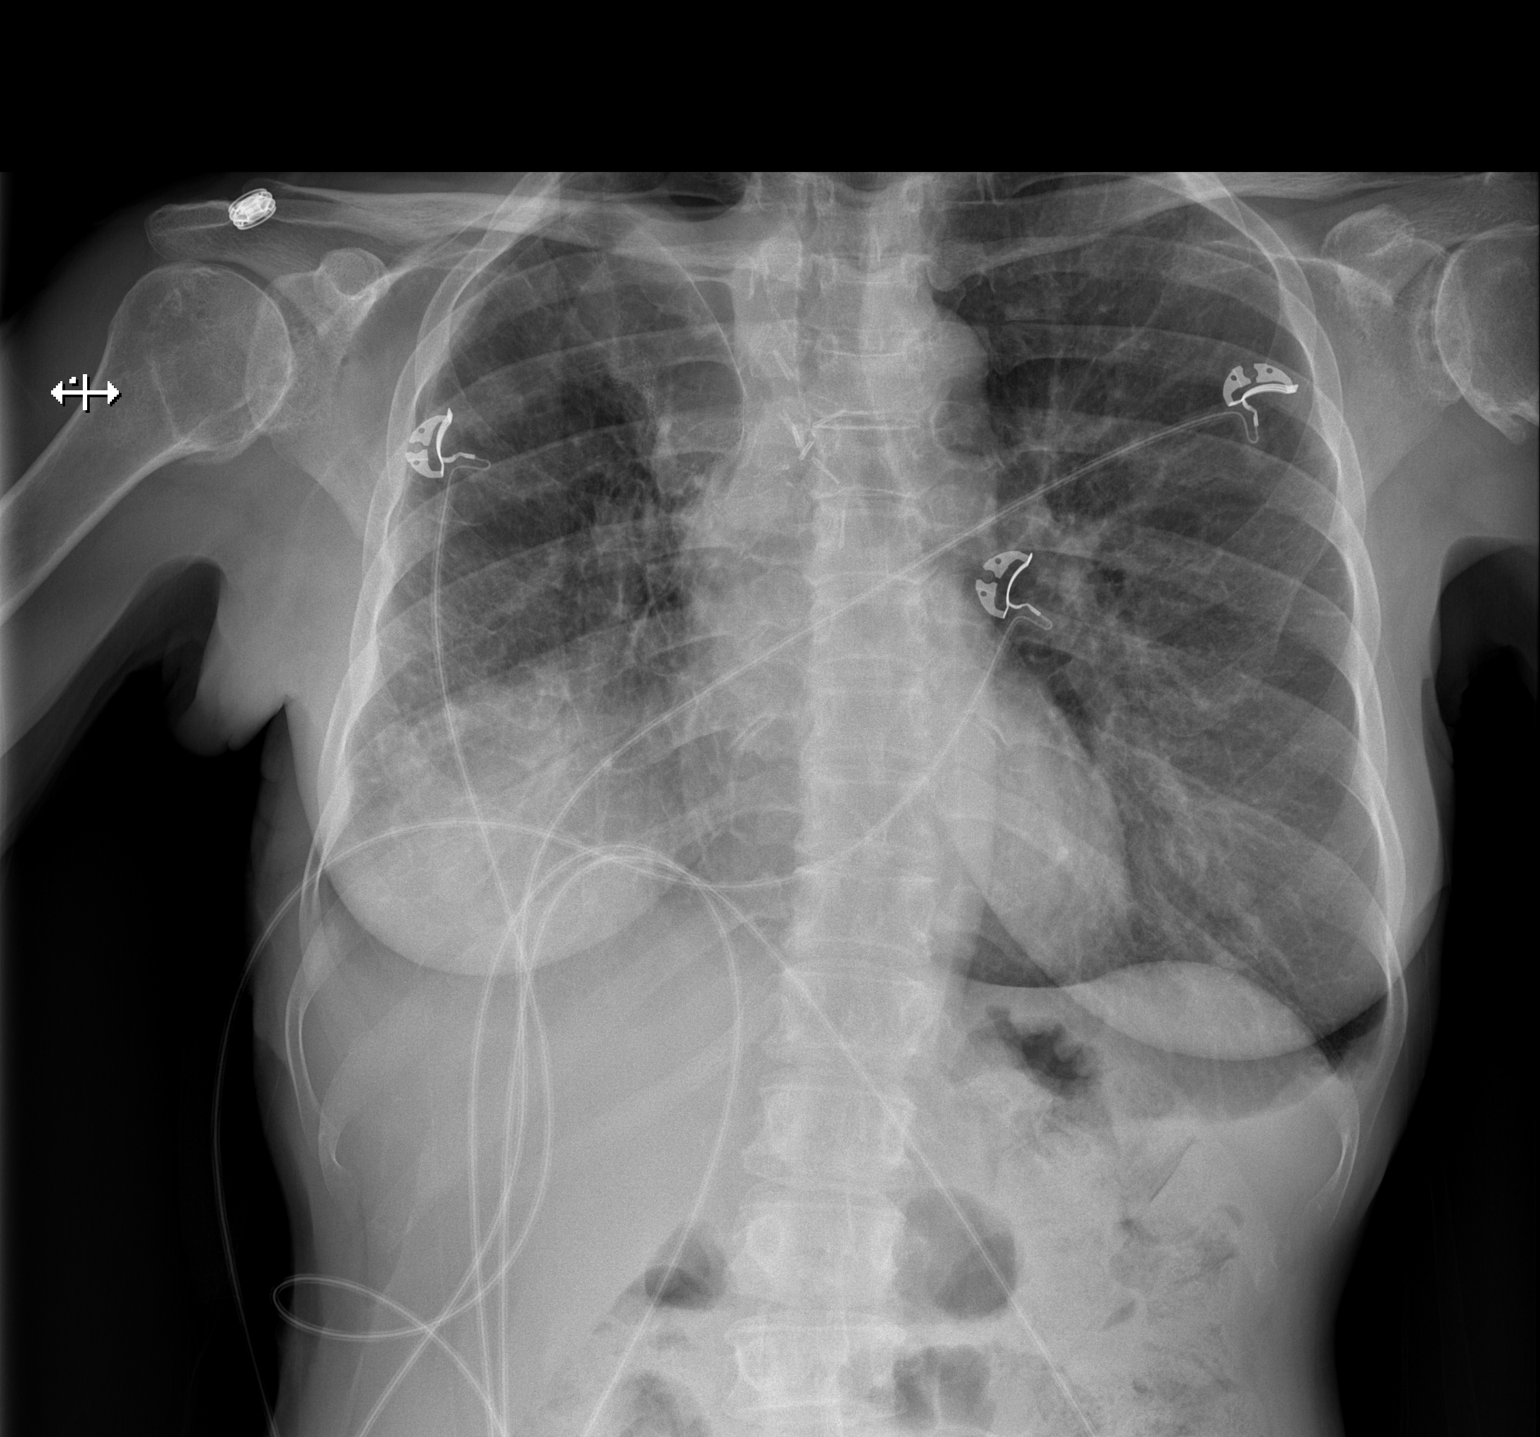

[w chest lat]
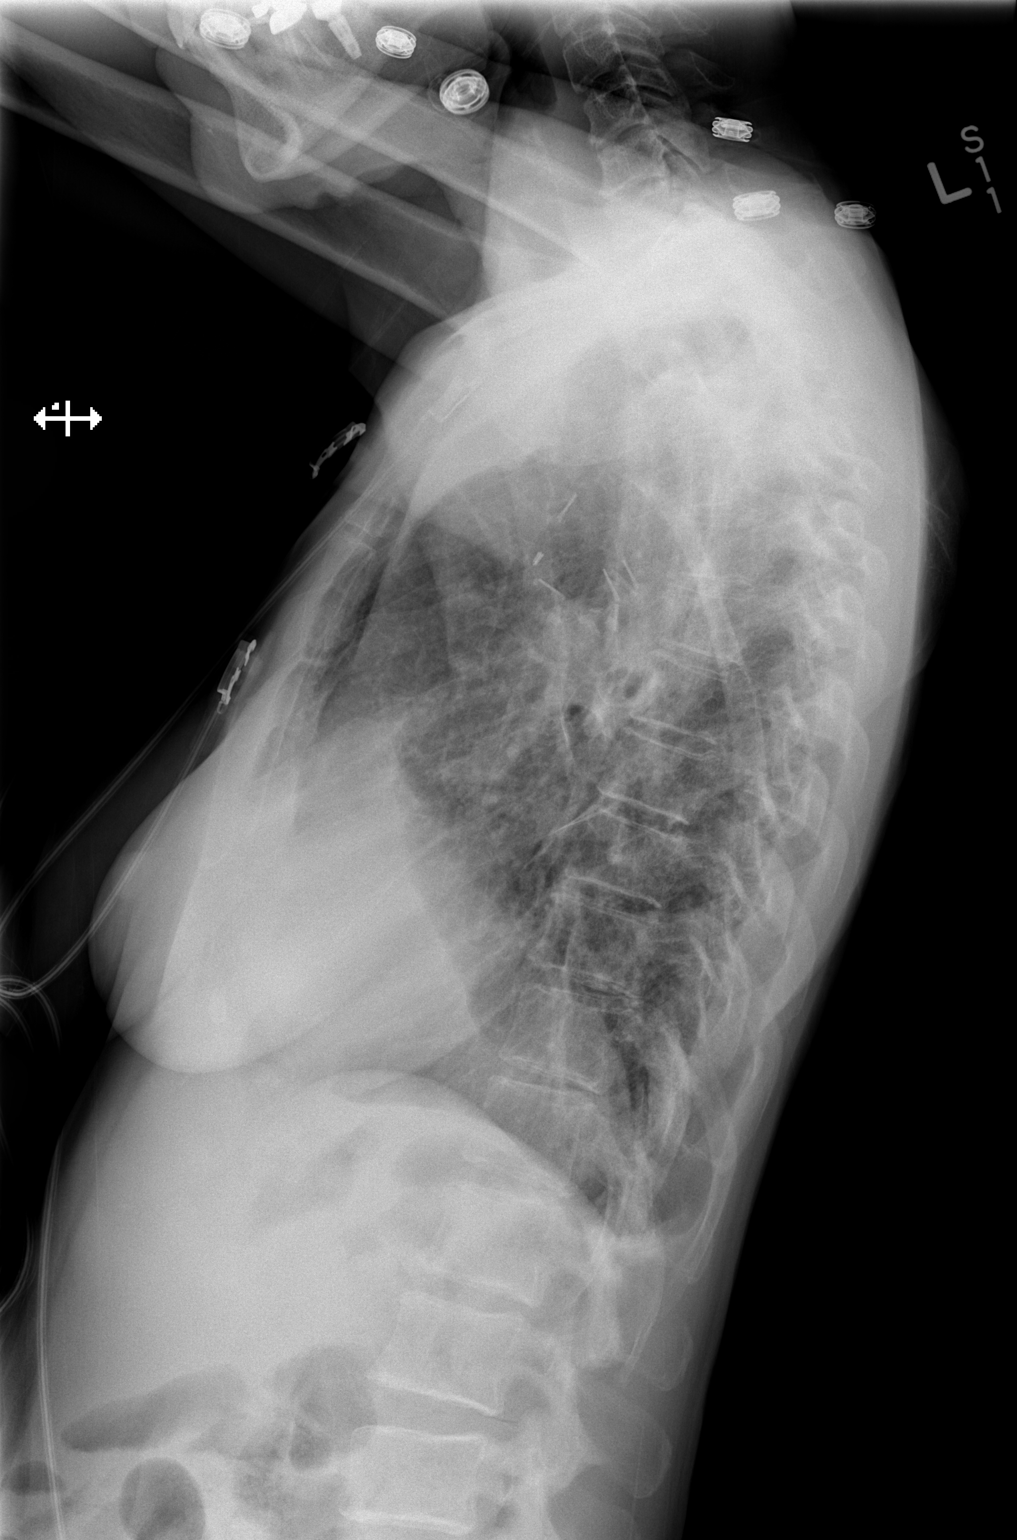

[2 of 2 positions shown; findings below may reference images not displayed]

FINDINGS: The small right-sided pneumothorax persists. It likely amounts to
10-15% of the lung volume. There is persistent right paratracheal
soft tissue density. Surgical clips remain in the right suprahilar
region. The right hemidiaphragm is less well demonstrated today. On
the left the lung is well-expanded. The interstitial markings remain
increased. The heart is normal in size. The pulmonary vascularity is
mildly prominent centrally but stable. The bony thorax exhibits no
acute abnormality. There are degenerative changes of the shoulders.
IMPRESSION: Stable appearance of the approximately 10-15% right sided
pneumothorax. No large right pleural effusion. Persistently
increased interstitial markings in the left lung which may reflect
interstitial edema or less likely pneumonia.

## 2016-02-29 ENCOUNTER — Other Ambulatory Visit: Payer: BC Managed Care – PPO

## 2016-03-01 ENCOUNTER — Encounter (HOSPITAL_COMMUNITY): Payer: Self-pay

## 2016-03-01 ENCOUNTER — Ambulatory Visit (HOSPITAL_COMMUNITY)
Admission: RE | Admit: 2016-03-01 | Discharge: 2016-03-01 | Disposition: A | Discharge status: Home / Self Care | Payer: Medicare Other | Source: Ambulatory Visit | Attending: Internal Medicine | Admitting: Internal Medicine

## 2016-03-01 ENCOUNTER — Other Ambulatory Visit (HOSPITAL_BASED_OUTPATIENT_CLINIC_OR_DEPARTMENT_OTHER): Payer: Medicare Other

## 2016-03-01 DIAGNOSIS — C3490 Malignant neoplasm of unspecified part of unspecified bronchus or lung: Secondary | ICD-10-CM | POA: Diagnosis not present

## 2016-03-01 DIAGNOSIS — C3491 Malignant neoplasm of unspecified part of right bronchus or lung: Secondary | ICD-10-CM

## 2016-03-01 DIAGNOSIS — C3411 Malignant neoplasm of upper lobe, right bronchus or lung: Secondary | ICD-10-CM | POA: Diagnosis present

## 2016-03-01 DIAGNOSIS — R059 Cough, unspecified: Secondary | ICD-10-CM

## 2016-03-01 DIAGNOSIS — R918 Other nonspecific abnormal finding of lung field: Secondary | ICD-10-CM | POA: Diagnosis not present

## 2016-03-01 DIAGNOSIS — R05 Cough: Secondary | ICD-10-CM

## 2016-03-01 LAB — CBC WITH DIFFERENTIAL/PLATELET
BASO%: 0.3 % (ref 0.0–2.0)
Basophils Absolute: 0 10*3/uL (ref 0.0–0.1)
EOS%: 0.2 % (ref 0.0–7.0)
Eosinophils Absolute: 0 10*3/uL (ref 0.0–0.5)
HCT: 40.8 % (ref 34.8–46.6)
HGB: 13.8 g/dL (ref 11.6–15.9)
LYMPH%: 6.4 % — ABNORMAL LOW (ref 14.0–49.7)
MCH: 31.4 pg (ref 25.1–34.0)
MCHC: 33.8 g/dL (ref 31.5–36.0)
MCV: 92.8 fL (ref 79.5–101.0)
MONO#: 0.1 10*3/uL (ref 0.1–0.9)
MONO%: 1.6 % (ref 0.0–14.0)
NEUT#: 7.7 10*3/uL — ABNORMAL HIGH (ref 1.5–6.5)
NEUT%: 91.5 % — ABNORMAL HIGH (ref 38.4–76.8)
Platelets: 249 10*3/uL (ref 145–400)
RBC: 4.4 10*6/uL (ref 3.70–5.45)
RDW: 12.7 % (ref 11.2–14.5)
WBC: 8.4 10*3/uL (ref 3.9–10.3)
lymph#: 0.5 10*3/uL — ABNORMAL LOW (ref 0.9–3.3)

## 2016-03-01 LAB — COMPREHENSIVE METABOLIC PANEL
ALT: 15 U/L (ref 0–55)
AST: 20 U/L (ref 5–34)
Albumin: 3.9 g/dL (ref 3.5–5.0)
Alkaline Phosphatase: 75 U/L (ref 40–150)
Anion Gap: 9 mEq/L (ref 3–11)
BUN: 21.3 mg/dL (ref 7.0–26.0)
CO2: 28 mEq/L (ref 22–29)
Calcium: 9.8 mg/dL (ref 8.4–10.4)
Chloride: 103 mEq/L (ref 98–109)
Creatinine: 0.9 mg/dL (ref 0.6–1.1)
EGFR: 68 mL/min/{1.73_m2} — ABNORMAL LOW (ref >90)
Glucose: 121 mg/dl (ref 70–140)
Potassium: 4.3 mEq/L (ref 3.5–5.1)
Sodium: 141 mEq/L (ref 136–145)
Total Bilirubin: 0.36 mg/dL (ref 0.20–1.20)
Total Protein: 7.1 g/dL (ref 6.4–8.3)

## 2016-03-01 MED ORDER — IOPAMIDOL (ISOVUE-300) INJECTION 61%
75.0000 mL | Freq: Once | INTRAVENOUS | Status: AC | PRN
  Administered 2016-03-01: 75 mL via INTRAVENOUS

## 2016-03-02 ENCOUNTER — Telehealth: Payer: Self-pay | Admitting: Adult Health

## 2016-03-02 NOTE — Telephone Encounter (Signed)
I received a call from Ms. Schellhase stating that she had her CT scan yesterday and took her prednisone prep before the scan and "I woke up with my face and my chest beet red."  She has some pruritis to the chest, but none on her face. No trouble breathing.  She thinks this is related to the prednisone and not to the contrast from her CT yesterday.  She tells me that when she had to take steroids for her chemo, she had a similar reaction.   Encouraged her to take a benadryl, just in case, and to call me if she has any further problems.  It may be appropriate for her to do her prior steroid prep (what was done for previous CT scans) than the regimen we gave her this time (which was prednisone 60 mg on day of scan, '40mg'$  day after, and '20mg'$  on day 3, then stop).  Encouraged her to call me with any other concerns.  She will see Dr. Julien Nordmann next week to get her results.   Mike Craze, NP [place] [phone]

## 2016-03-07 ENCOUNTER — Telehealth: Payer: Self-pay | Admitting: Internal Medicine

## 2016-03-07 ENCOUNTER — Ambulatory Visit (HOSPITAL_BASED_OUTPATIENT_CLINIC_OR_DEPARTMENT_OTHER): Payer: Medicare Other | Admitting: Internal Medicine

## 2016-03-07 ENCOUNTER — Encounter: Payer: Self-pay | Admitting: Internal Medicine

## 2016-03-07 VITALS — BP 123/72 | HR 99 | Temp 97.9°F | Resp 18 | Ht 59.0 in | Wt 93.7 lb

## 2016-03-07 DIAGNOSIS — C3411 Malignant neoplasm of upper lobe, right bronchus or lung: Secondary | ICD-10-CM

## 2016-03-07 DIAGNOSIS — C3491 Malignant neoplasm of unspecified part of right bronchus or lung: Secondary | ICD-10-CM

## 2016-03-07 NOTE — Telephone Encounter (Signed)
Gave patient avs report and appointments for December. Central radiology will call re scan.  °

## 2016-03-07 NOTE — Progress Notes (Signed)
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   PRIOR THERAPY:  1) Right VATS with right upper lobectomy with bronchoplasty closure and en bloc resection of a wedge from the superior segment of the right lower lobe in addition to mediastinal lymph node dissection on 07/30/2015 under the care of Dr. Roxan Hockey. 2) Adjuvant systemic chemotherapy with cisplatin 75 MG/M2 and Alimta 500 MG/M2 every 3 weeks. First dose was given on 09/10/2015. Status post 4 cycles. 3) adjuvant radiotherapy under the care of Dr. Tammi Klippel.  CURRENT THERAPY: Observation.  INTERVAL HISTORY: [name] West 65 y.o. female returns to the clinic today for follow-up visit accompanied by her husband. The patient is doing fine today with no specific complaints. She completed a course of adjuvant systemic chemotherapy as well as adjuvant radiotherapy. She denied having any chest pain, shortness of breath, cough or hemoptysis. She denied having any nausea or vomiting. She has no fever or chills. She has repeat CT scan of the chest performed recently and she is here for evaluation and discussion of her scan results.  MEDICAL HISTORY: Past Medical History:  Diagnosis Date  . Anxiety   . Arthritis    shoulders  . Complication of anesthesia    nausea  . Cough 09/17/2015  . Encounter for antineoplastic chemotherapy 10/22/2015  . Environmental and seasonal allergies   . Family history of adverse reaction to anesthesia    Patients grandmother died while having hand surgery; pt unsure of cause  . Oral thrush 10/22/2015  . Ovarian cyst   . PONV  (postoperative nausea and vomiting)     ALLERGIES:  is allergic to avelox [moxifloxacin hcl in nacl]; cephalosporins; clindamycin/lincomycin; penicillins; macrobid [nitrofurantoin monohyd macro]; omnipaque [iohexol]; adhesive [tape]; doxycycline; lidoderm [lidocaine]; pseudoephedrine; and sulfa antibiotics.  MEDICATIONS:  Current Outpatient Prescriptions  Medication Sig Dispense Refill  . acetaminophen (TYLENOL) 500 MG tablet Take 2 tablets (1,000 mg total) by mouth every 6 (six) hours as needed. 30 tablet 0  . Azelaic Acid (FINACEA) 15 % cream Apply 1 application topically 2 (two) times daily. After skin is thoroughly washed and patted dry, gently but thoroughly massage a thin film of azelaic acid cream into the affected area twice daily, in the morning and evening.    Marland Kitchen azithromycin (ZITHROMAX) 250 MG tablet     . Brimonidine Tartrate (MIRVASO) 0.33 % GEL Apply 1 application topically daily.    . cholecalciferol (VITAMIN D) 1000 UNITS tablet Take 1,000 Units by mouth daily.    . citalopram (CELEXA) 10 MG tablet Take 10 mg by mouth every morning.    . fexofenadine (ALLEGRA) 180 MG tablet Take 180 mg by mouth daily.    Marland Kitchen LORazepam (ATIVAN) 0.5 MG tablet Take 0.5 mg by mouth as needed. Reported on 10/07/2015    . Multiple Vitamin (MULTIVITAMIN WITH MINERALS) TABS Take 1 tablet by mouth daily.    . polyethylene glycol powder (MIRALAX) powder Take 255 g by mouth once. 255 g 0  . predniSONE (DELTASONE) 20 MG tablet Take 1 tablet (20 mg total) by mouth daily with breakfast. Take 3 tabs (60 mg) on day of CT scan. Take 2 tabs (40  mg) day after scan. Take 1 tab (20 mg) on day 3, then stop. 6 tablet 0  . Pseudoephedrine HCl (SUPHEDRINE PO) Take by mouth.    . Wound Cleansers (RADIAPLEX EX) Apply topically.    Marland Kitchen zolpidem (AMBIEN) 5 MG tablet Take 5 mg by mouth at bedtime as needed for sleep. Reported on 01/01/2016     No current facility-administered medications for this visit.     SURGICAL HISTORY:    Past Surgical History:  Procedure Laterality Date  . CESAREAN SECTION  1984  . COLONOSCOPY WITH PROPOFOL N/A 09/27/2012   Procedure: COLONOSCOPY WITH PROPOFOL;  Surgeon: Garlan Fair, MD;  Location: [facility];  Service: Endoscopy;  Laterality: N/A;  . Fibroid Removed    . Right Rotator Cuff Right   . VIDEO ASSISTED THORACOSCOPY (VATS)/ LOBECTOMY Right 07/30/2015   Procedure: RIGHT VIDEO ASSISTED THORACOSCOPY (VATS)/RIGHT UPPER LOBECTOMY;  Surgeon: Melrose Nakayama, MD;  Location: [facility];  Service: Thoracic;  Laterality: Right;  Marland Kitchen VIDEO BRONCHOSCOPY WITH ENDOBRONCHIAL NAVIGATION N/A 07/15/2015   Procedure: VIDEO BRONCHOSCOPY WITH ENDOBRONCHIAL NAVIGATION with Biopsy;  Surgeon: Collene Gobble, MD;  Location: [facility];  Service: Thoracic;  Laterality: N/A;    REVIEW OF SYSTEMS:  A comprehensive review of systems was negative.   PHYSICAL EXAMINATION: General appearance: alert, cooperative and no distress Head: Normocephalic, without obvious abnormality, atraumatic Neck: no adenopathy, no JVD, supple, symmetrical, trachea midline and thyroid not enlarged, symmetric, no tenderness/mass/nodules Lymph nodes: Cervical, supraclavicular, and axillary nodes normal. Resp: clear to auscultation bilaterally Back: symmetric, no curvature. ROM normal. No CVA tenderness. Cardio: regular rate and rhythm, S1, S2 normal, no murmur, click, rub or gallop GI: soft, non-tender; bowel sounds normal; no masses,  no organomegaly Extremities: extremities normal, atraumatic, no cyanosis or edema  ECOG PERFORMANCE STATUS: 1 - Symptomatic but completely ambulatory  There were no vitals taken for this visit.  LABORATORY DATA: Lab Results  Component Value Date   WBC 8.4 03/01/2016   HGB 13.8 03/01/2016   HCT 40.8 03/01/2016   MCV 92.8 03/01/2016   PLT 249 03/01/2016      Chemistry      Component Value Date/Time   NA 141 03/01/2016 1017   K 4.3 03/01/2016 1017   CL 97 (L) 08/14/2015 2201   CO2 28  03/01/2016 1017   BUN 21.3 03/01/2016 1017   CREATININE 0.9 03/01/2016 1017      Component Value Date/Time   CALCIUM 9.8 03/01/2016 1017   ALKPHOS 75 03/01/2016 1017   AST 20 03/01/2016 1017   ALT 15 03/01/2016 1017   BILITOT 0.36 03/01/2016 1017       RADIOGRAPHIC STUDIES: Ct Chest W Contrast  Result Date: 03/01/2016 CLINICAL DATA:  Followup lung cancer EXAM: CT CHEST WITH CONTRAST TECHNIQUE: Multidetector CT imaging of the chest was performed during intravenous contrast administration. CONTRAST:  73m ISOVUE-300 IOPAMIDOL (ISOVUE-300) INJECTION 61% COMPARISON:  11/25/2015 FINDINGS: Cardiovascular: Normal heart size. No pericardial effusion. Aortic atherosclerosis noted. Mediastinum/Nodes: No enlarged mediastinal, hilar, or axillary lymph nodes. Thyroid gland, trachea, and esophagus demonstrate no significant findings. Lungs/Pleura: No pleural effusion. Mild changes of centrilobular emphysema. Postoperative changes and volume loss from the right lung are again identified. 4 mm right lower lobe lung nodule is stable, image 64 of series 5. Stable perifissural nodule in the right mid lung measures 3 mm and is unchanged, image 46 of series 5. Stable anterior right lung nodule measuring 2 mm, image 46 of series 5. New area of ground-glass  attenuation with central solid nodule in the right upper lung zone. This measures 6 mm, image 27 of series 5. Upper Abdomen: No acute abnormality. Musculoskeletal: No aggressive lytic or sclerotic bone lesions. IMPRESSION: 1. Since the previous exam there has been interval development of ground-glass attenuation with central area of solid nodule, nonspecific. The nodule measures 6 mm. The appearance is nonspecific and may be inflammatory in etiology. Cannot rule out recurrence of tumor. Consider short-term interval follow-up in 3 months to confirm persistence. 2. Other small solid nodules are stable when compared with previous exam. Electronically Signed   By: Kerby Moors M.D.   On: 03/01/2016 14:56    ASSESSMENT AND PLAN: This is a very pleasant 65 years old white female recently diagnosed with a stage IIIA non-small cell lung cancer status post right upper lobectomy with lymph node dissection and currently undergoing adjuvant systemic chemotherapy with cisplatin and Alimta status post 4 cycles. This was also followed by adjuvant radiotherapy. The recent CT scan of the chest showed no clear findings for disease recurrence or progression except for nonspecific 6 mm groundglass opacity in the right upper lobe lung zone. I discussed the scan results and showed the images to the patient and her husband. I recommended for her to continue on observation with repeat CT scan of the chest in 3 months. She was advised to call immediately if she has any concerning symptoms in the interval. The patient voices understanding of current disease status and treatment options and is in agreement with the current care plan.  All questions were answered. The patient knows to call the clinic with any problems, questions or concerns. We can certainly see the patient much sooner if necessary.  Disclaimer: This note was dictated with voice recognition software. Similar sounding words can inadvertently be transcribed and may not be corrected upon review.

## 2016-03-08 ENCOUNTER — Telehealth: Payer: Self-pay | Admitting: Adult Health

## 2016-03-08 NOTE — Telephone Encounter (Signed)
I received a call from [name] West today stating that she has some itching around her bra line area on her chest and around to her back; she is wondering if it could be related to the IV contrast.    I reassured her that the IV contrast should be out of her system at this point, since she had her scan 1 week ago.  She also finished her prednisone last week as well.  Therefore, I let her know that I do not think this is related to the contrast from her CT scan, but encouraged her to monitor her symptoms and treat them with OTC agents (like Benadryl) as needed.    She was mostly concerned because she leaves to go out of the country this weekend (to [place]) and wanted to make sure it wasn't anything she needed to worry about.  I encouraged her to have a great time on her trip and continue to manage her symptoms, which likely will resolved on their own.  Encouraged her to call me with any other needs or concerns.  She voiced understanding and appreciation.   Mike Craze, NP [place] [phone]

## 2016-03-09 ENCOUNTER — Telehealth: Payer: Self-pay | Admitting: *Deleted

## 2016-03-09 DIAGNOSIS — R238 Other skin changes: Secondary | ICD-10-CM | POA: Diagnosis not present

## 2016-03-09 NOTE — Telephone Encounter (Signed)
TC from patient stating she is experiencing irritated skin along her bra line and on both sides of her back. She states she had a CT Scan on 03/01/16 with contrast. She has an allergy to contrast and was treated with prednisone prior to and after scan. Completed prednisone on Thursday. Skin irritation developed on Friday.  She did see her PCP - no rash is visible but pt feels that her skin is extra sensitive to touch-even clothing brushing against it. She tried a couple of doses of benadryl w/o relief. PCP suggested she see her oncologist. Advised pt to try consistent benadryl 25 mg every 6 hours OTC along with Pepcid 20 mg every 12 hours.    Scheduled appt in [facility] for tomorrow @ 1:45 pm. Pt can cancel if she is feeling better.  Pt is traveling to [place] on Friday and [place] not want to have this as an issue on her vacation. Advised to take extra Benadryl and Pepcid with her just in case.  Pt verbalized understanding of the above.

## 2016-03-10 ENCOUNTER — Ambulatory Visit (HOSPITAL_BASED_OUTPATIENT_CLINIC_OR_DEPARTMENT_OTHER): Payer: Medicare Other | Admitting: Nurse Practitioner

## 2016-03-10 ENCOUNTER — Encounter: Payer: BC Managed Care – PPO | Admitting: Adult Health

## 2016-03-10 ENCOUNTER — Encounter: Payer: Self-pay | Admitting: Nurse Practitioner

## 2016-03-10 VITALS — BP 108/61 | HR 85 | Temp 97.6°F | Resp 16 | Ht <= 58 in | Wt 95.1 lb

## 2016-03-10 DIAGNOSIS — L989 Disorder of the skin and subcutaneous tissue, unspecified: Secondary | ICD-10-CM

## 2016-03-10 DIAGNOSIS — C3491 Malignant neoplasm of unspecified part of right bronchus or lung: Secondary | ICD-10-CM

## 2016-03-10 DIAGNOSIS — R238 Other skin changes: Secondary | ICD-10-CM | POA: Insufficient documentation

## 2016-03-10 NOTE — Progress Notes (Signed)
SYMPTOM MANAGEMENT CLINIC    Chief Complaint: skin sensitivity  HP the I:  [name] West 65 y.o. female diagnosed with lung cancer. Currently undergoing observation only.    Oncology History   Patient presented with left sided CP.  Work up showed lung mass.   Non-small cell cancer of right lung [facility])   Staging form: Lung, AJCC 7th Edition     Clinical stage from 08/27/2015: Stage IIIA (T2a, N2, M0) - Signed by Curt Bears, MD on 08/27/2015       Non-small cell cancer of right lung ([place])   06/21/2015 Imaging    CT Chest There is a 3.7 x 4.5 x 4.0 cm sub solid pulmonary mass versus area of airspace consolidation in the right upper lobe. Spiculations are seen extending to the anterior and lateral pleural surfaces.      06/25/2015 PET scan    Malignant range FDG uptake associated with mass-like density in RUL. No evidence of mediastinal or hilar lymph node mets and no distant metastatic disease.       07/15/2015 Initial Diagnosis    Non-small cell lung cancer ([place]) Right upper lobe, diagnosed by navigational bronchoscopy      07/15/2015 Surgery    Operation: Flexible video fiberoptic bronchoscopy with electromagnetic navigation and biopsies      07/30/2015 Surgery    PROCEDURE:  Right video-assisted thoracoscopy Thoracoscopic right upper lobectomy with bronchoplastic closure and en bloc resection of a wedge from the superior segment of the right lower lobe Mediastinal lymph node dissection.      07/30/2015 Pathology Results    RUL: Well-diff adenocarcinoma of lung, 4 cm, invades visceral pleura, LVI(+), negative margins.  Dissected lymph nodes from level 7, 10R, 12R, and 4R showed evidence for metastatic adenocarcinoma (8/13 LNs positive).    pT2a, pN2: Stage IIIA       08/13/2015 Imaging    MRI Brain No abnormal enhancement to suggest metastatic disease to the brain or meninges. No osseous lesions are identified.      09/10/2015 - 11/12/2015 Adjuvant Chemotherapy   Cisplatin/Pemetrexed x 4 cycles       11/25/2015 Imaging    CT chest: Interval RUL lobectomy. Tiny nonspecific bilat lower lobe nodules. Continued attention on f/u recommended.       11/30/2015 - 12/28/2015 Radiation Therapy    Adjuvant radiation Tammi Klippel): The primary tumor and involved mediastinal adenopathy were treated to 50.4 Gy in 28 fractions of 1.8 Gy.       Review of Systems  Skin:       Skin sensitivity  All other systems reviewed and are negative.   Past Medical History:  Diagnosis Date  . Anxiety   . Arthritis    shoulders  . Complication of anesthesia    nausea  . Cough 09/17/2015  . Encounter for antineoplastic chemotherapy 10/22/2015  . Environmental and seasonal allergies   . Family history of adverse reaction to anesthesia    Patients grandmother died while having hand surgery; pt unsure of cause  . Oral thrush 10/22/2015  . Ovarian cyst   . PONV (postoperative nausea and vomiting)     Past Surgical History:  Procedure Laterality Date  . CESAREAN SECTION  1984  . COLONOSCOPY WITH PROPOFOL N/A 09/27/2012   Procedure: COLONOSCOPY WITH PROPOFOL;  Surgeon: Garlan Fair, MD;  Location: [facility];  Service: Endoscopy;  Laterality: N/A;  . Fibroid Removed    . Right Rotator Cuff Right   . VIDEO ASSISTED THORACOSCOPY (VATS)/ LOBECTOMY  Right 07/30/2015   Procedure: RIGHT VIDEO ASSISTED THORACOSCOPY (VATS)/RIGHT UPPER LOBECTOMY;  Surgeon: Melrose Nakayama, MD;  Location: [place];  Service: Thoracic;  Laterality: Right;  Marland Kitchen VIDEO BRONCHOSCOPY WITH ENDOBRONCHIAL NAVIGATION N/A 07/15/2015   Procedure: VIDEO BRONCHOSCOPY WITH ENDOBRONCHIAL NAVIGATION with Biopsy;  Surgeon: Collene Gobble, MD;  Location: [place];  Service: Thoracic;  Laterality: N/A;    has Non-small cell cancer of right lung ([place]); S/P lobectomy of lung; Cough; Oral thrush; Encounter for antineoplastic chemotherapy; and Skin sensitivity on her problem list.    is allergic to avelox [moxifloxacin hcl  in nacl]; cephalosporins; clindamycin/lincomycin; penicillins; macrobid [nitrofurantoin monohyd macro]; omnipaque [iohexol]; adhesive [tape]; doxycycline; lidoderm [lidocaine]; pseudoephedrine; and sulfa antibiotics.    Medication List       Accurate as of 03/10/16  4:37 PM. Always use your most recent med list.          acetaminophen 500 MG tablet Commonly known as:  TYLENOL Take 2 tablets (1,000 mg total) by mouth every 6 (six) hours as needed.   azithromycin 250 MG tablet Commonly known as:  ZITHROMAX   cetirizine 10 MG tablet Commonly known as:  ZYRTEC Take 10 mg by mouth daily.   cholecalciferol 1000 units tablet Commonly known as:  VITAMIN D Take 1,000 Units by mouth daily.   citalopram 10 MG tablet Commonly known as:  CELEXA Take 10 mg by mouth every morning.   FINACEA 15 % cream Generic drug:  Azelaic Acid Apply 1 application topically 2 (two) times daily. After skin is thoroughly washed and patted dry, gently but thoroughly massage a thin film of azelaic acid cream into the affected area twice daily, in the morning and evening.   LORazepam 0.5 MG tablet Commonly known as:  ATIVAN Take 0.5 mg by mouth as needed. Reported on 10/07/2015   MIRVASO 0.33 % Gel Generic drug:  Brimonidine Tartrate Apply 1 application topically daily.   multivitamin with minerals Tabs tablet Take 1 tablet by mouth daily.   polyethylene glycol powder powder Commonly known as:  MIRALAX Take 255 g by mouth once.   predniSONE 20 MG tablet Commonly known as:  DELTASONE Take 1 tablet (20 mg total) by mouth daily with breakfast. Take 3 tabs (60 mg) on day of CT scan. Take 2 tabs (40 mg) day after scan. Take 1 tab (20 mg) on day 3, then stop.   RADIAPLEX EX Apply topically.   SUPHEDRINE PO Take by mouth.   zolpidem 5 MG tablet Commonly known as:  AMBIEN Take 5 mg by mouth at bedtime as needed for sleep. Reported on 01/01/2016        PHYSICAL EXAMINATION  Oncology Vitals  03/10/2016 03/07/2016  Height 125 cm 150 cm  Weight 43.137 kg 42.502 kg  Weight (lbs) 95 lbs 2 oz 93 lbs 11 oz  BMI (kg/m2) 27.85 kg/m2 18.93 kg/m2  Temp 97.6 97.9  Pulse 85 99  Resp 16 18  SpO2 99 98  BSA (m2) 1.22 m2 1.33 m2   BP Readings from Last 2 Encounters:  03/10/16 108/61  03/07/16 123/72    Physical Exam  Constitutional: She is oriented to person, place, and time and well-developed, well-nourished, and in no distress.  HENT:  Head: Normocephalic and atraumatic.  Eyes: Conjunctivae and EOM are normal. Pupils are equal, round, and reactive to light.  Neck: Normal range of motion.  Pulmonary/Chest: Effort normal. No respiratory distress.  Musculoskeletal: Normal range of motion. She exhibits no edema or tenderness.  Neurological:  She is alert and oriented to person, place, and time. Gait normal.  Skin: Skin is warm and dry. No rash noted. No erythema. No pallor.  Exam today reveals there is no rash, no erythema, no warmth, or no injury to any of these sites.  Patient states that it is difficult for her to wear products since touches her skin.          Psychiatric: Affect normal.  Nursing note and vitals reviewed.   LABORATORY DATA:. No visits with results within 3 Day(s) from this visit.  Latest known visit with results is:  Appointment on 03/01/2016  Component Date Value Ref Range Status  . WBC 03/01/2016 8.4  3.9 - 10.3 10e3/uL Final  . NEUT# 03/01/2016 7.7* 1.5 - 6.5 10e3/uL Final  . HGB 03/01/2016 13.8  11.6 - 15.9 g/dL Final  . HCT 03/01/2016 40.8  34.8 - 46.6 % Final  . Platelets 03/01/2016 249  145 - 400 10e3/uL Final  . MCV 03/01/2016 92.8  79.5 - 101.0 fL Final  . MCH 03/01/2016 31.4  25.1 - 34.0 pg Final  . MCHC 03/01/2016 33.8  31.5 - 36.0 g/dL Final  . RBC 03/01/2016 4.40  3.70 - 5.45 10e6/uL Final  . RDW 03/01/2016 12.7  11.2 - 14.5 % Final  . lymph# 03/01/2016 0.5* 0.9 - 3.3 10e3/uL Final  . MONO# 03/01/2016 0.1  0.1 - 0.9 10e3/uL Final  .  Eosinophils Absolute 03/01/2016 0.0  0.0 - 0.5 10e3/uL Final  . Basophils Absolute 03/01/2016 0.0  0.0 - 0.1 10e3/uL Final  . NEUT% 03/01/2016 91.5* 38.4 - 76.8 % Final  . LYMPH% 03/01/2016 6.4* 14.0 - 49.7 % Final  . MONO% 03/01/2016 1.6  0.0 - 14.0 % Final  . EOS% 03/01/2016 0.2  0.0 - 7.0 % Final  . BASO% 03/01/2016 0.3  0.0 - 2.0 % Final  . Sodium 03/01/2016 141  136 - 145 mEq/L Final  . Potassium 03/01/2016 4.3  3.5 - 5.1 mEq/L Final  . Chloride 03/01/2016 103  98 - 109 mEq/L Final  . CO2 03/01/2016 28  22 - 29 mEq/L Final  . Glucose 03/01/2016 121  70 - 140 mg/dl Final  . BUN 03/01/2016 21.3  7.0 - 26.0 mg/dL Final  . Creatinine 03/01/2016 0.9  0.6 - 1.1 mg/dL Final  . Total Bilirubin 03/01/2016 0.36  0.20 - 1.20 mg/dL Final  . Alkaline Phosphatase 03/01/2016 75  40 - 150 U/L Final  . AST 03/01/2016 20  5 - 34 U/L Final  . ALT 03/01/2016 15  0 - 55 U/L Final  . Total Protein 03/01/2016 7.1  6.4 - 8.3 g/dL Final  . Albumin 03/01/2016 3.9  3.5 - 5.0 g/dL Final  . Calcium 03/01/2016 9.8  8.4 - 10.4 mg/dL Final  . Anion Gap 03/01/2016 9  3 - 11 mEq/L Final  . EGFR 03/01/2016 68* >90 ml/min/1.73 m2 Final    RADIOGRAPHIC STUDIES: No results found.  ASSESSMENT/PLAN:    Skin sensitivity Patient states that she developed skin sensitivity to various areas of her body that was initially noted on Friday, 03/04/2016.  She states that the skin sensitivity has slightly progressed since that time.  She denies any known rash or itching.  She also denies any other hypersensitivity reaction symptoms whatsoever.  She denies any recent fevers or chills.  Patient states that she went her regular primary care provider earlier this week; and they were unable to give her a clear diagnosis of the sensitivity.  They prescribed Neurontin for treatment of the skin sensitivity; but patient has withheld trying the Neurontin.  She states that she is allergic to multiple medications; and reacts to most  medications.  She also notes that she is allergic to IV contrast; but was premedicated per hypersensitivity protocol prior to undergoing her last restaging scan last week.  She stated that her face became slightly red.  After the contrast; but she had no other issues with the contrast whatsoever.  She is complaining of sensitivity to her mid abdominal region and across her mid back as well.  She also states that she has some sensitivity to her mouth area as well.  She denies any numbness or tingling to her numb and is managing all secretions well.  Exam today reveals there is no rash, no erythema, no warmth, or no injury to any of these sites.  Patient states that it is difficult for her to wear products since touches her skin.  She also states that the sensation feels like the sensation you have when he burned her tongue on coffee.  Reviewed all findings with Dr. Julien Nordmann; and he suggested that this may very well be secondary to patient's previous lobectomy.  Surgical site healing.  Also, could consider that this is actually allodynia instead.  Advised patient that it might be a good idea to try the Neurontin; the patient states that she is leaving tomorrow for a trip to [place] for 11 days.  She would prefer not to experience any allergic-type reaction symptoms just prior to her trip.  discussed option of lidocaine cream to the site; the patient states that she is allergic to lidocaine as well.  Patient was advised to call/return or go directly to an emergency department for further evaluation and management is symptoms progress whatsoever.    Non-small cell cancer of right lung [facility]) Patient is currently undergoing observation only.  She is scheduled for labs on 05/30/2016 and scheduled for a follow-up visit on 06/01/2016.   Patient stated understanding of all instructions; and was in agreement with this plan of care. The patient knows to call the clinic with any problems, questions or concerns.     Total time spent with patient was 25 minutes;  with greater than 75 percent of that time spent in face to face counseling regarding patient's symptoms,  and coordination of care and follow up.  Disclaimer:This dictation was prepared with Dragon/digital dictation along with [employer]. Any transcriptional errors that result from this process are unintentional.  Drue Second, NP 03/10/2016

## 2016-03-10 NOTE — Assessment & Plan Note (Signed)
Patient is currently undergoing observation only.  She is scheduled for labs on 05/30/2016 and scheduled for a follow-up visit on 06/01/2016.

## 2016-03-10 NOTE — Assessment & Plan Note (Signed)
Patient states that she developed skin sensitivity to various areas of her body that was initially noted on Friday, 03/04/2016.  She states that the skin sensitivity has slightly progressed since that time.  She denies any known rash or itching.  She also denies any other hypersensitivity reaction symptoms whatsoever.  She denies any recent fevers or chills.  Patient states that she went her regular primary care provider earlier this week; and they were unable to give her a clear diagnosis of the sensitivity.  They prescribed Neurontin for treatment of the skin sensitivity; but patient has withheld trying the Neurontin.  She states that she is allergic to multiple medications; and reacts to most medications.  She also notes that she is allergic to IV contrast; but was premedicated per hypersensitivity protocol prior to undergoing her last restaging scan last week.  She stated that her face became slightly red.  After the contrast; but she had no other issues with the contrast whatsoever.  She is complaining of sensitivity to her mid abdominal region and across her mid back as well.  She also states that she has some sensitivity to her mouth area as well.  She denies any numbness or tingling to her numb and is managing all secretions well.  Exam today reveals there is no rash, no erythema, no warmth, or no injury to any of these sites.  Patient states that it is difficult for her to wear products since touches her skin.  She also states that the sensation feels like the sensation you have when he burned her tongue on coffee.  Reviewed all findings with Dr. Julien Nordmann; and he suggested that this may very well be secondary to patient's previous lobectomy.  Surgical site healing.  Also, could consider that this is actually allodynia instead.  Advised patient that it might be a good idea to try the Neurontin; the patient states that she is leaving tomorrow for a trip to [place] for 11 days.  She would prefer not  to experience any allergic-type reaction symptoms just prior to her trip.  discussed option of lidocaine cream to the site; the patient states that she is allergic to lidocaine as well.  Patient was advised to call/return or go directly to an emergency department for further evaluation and management is symptoms progress whatsoever.

## 2016-03-11 ENCOUNTER — Telehealth: Payer: Self-pay | Admitting: Adult Health

## 2016-03-11 DIAGNOSIS — K143 Hypertrophy of tongue papillae: Secondary | ICD-10-CM | POA: Diagnosis not present

## 2016-03-11 NOTE — Telephone Encounter (Signed)
I received an email from [name] asking me to call her about concerns for thrush.   I gave her a call and spoke with her.  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   Today, she tells me that she woke up with her tongue white and her tongue is sensitive as well.  Denies tongue pain.  She is unable to scrape the white patches from her tongue.  It feels the same as when she had thrush during chemotherapy.  She tells me that she has an appt with her PCP today to have this evaluated before she has to be at the airport at 11 am for her overseas flight to [place].  She asked if I would be willing to just call in medication for thrush for her rather than her having to go in to see her PCP.    I let her know that I would prefer for her to be evaluated/examined, particularly since she already has an appt with her PCP this morning before she leaves for her trip.  Certainly steroids can predispose patients to thrush, but it is very rare to acquire thrush from the steroid doses she had for 3 days for her CT scan several days ago.    She will call me back later this morning if she does not get resolution at her PCP's office.  I will consider giving her a prescription for Diflucan that she can take on her trip, considering she will be out of the country for nearly 2 weeks.  She stated that if we did not hear from her after her PCP visit, then she got everything taken care of.  Encouraged her to call with other needs or concerns and wished her well on her vacation.   Mike Craze, NP [place] [phone]

## 2016-04-04 NOTE — Telephone Encounter (Signed)
XXXX 

## 2016-04-26 DIAGNOSIS — N83299 Other ovarian cyst, unspecified side: Secondary | ICD-10-CM | POA: Diagnosis not present

## 2016-04-28 ENCOUNTER — Encounter: Payer: Self-pay | Admitting: Thoracic Surgery (Cardiothoracic Vascular Surgery)

## 2016-04-28 DIAGNOSIS — M25519 Pain in unspecified shoulder: Secondary | ICD-10-CM | POA: Diagnosis not present

## 2016-04-28 DIAGNOSIS — Z91038 Other insect allergy status: Secondary | ICD-10-CM | POA: Diagnosis not present

## 2016-04-28 DIAGNOSIS — D279 Benign neoplasm of unspecified ovary: Secondary | ICD-10-CM | POA: Diagnosis not present

## 2016-04-28 DIAGNOSIS — Z23 Encounter for immunization: Secondary | ICD-10-CM | POA: Diagnosis not present

## 2016-04-28 DIAGNOSIS — M85851 Other specified disorders of bone density and structure, right thigh: Secondary | ICD-10-CM | POA: Diagnosis not present

## 2016-04-28 DIAGNOSIS — L719 Rosacea, unspecified: Secondary | ICD-10-CM | POA: Diagnosis not present

## 2016-04-28 DIAGNOSIS — F411 Generalized anxiety disorder: Secondary | ICD-10-CM | POA: Diagnosis not present

## 2016-04-28 DIAGNOSIS — J309 Allergic rhinitis, unspecified: Secondary | ICD-10-CM | POA: Diagnosis not present

## 2016-04-28 DIAGNOSIS — C3491 Malignant neoplasm of unspecified part of right bronchus or lung: Secondary | ICD-10-CM | POA: Diagnosis not present

## 2016-04-28 DIAGNOSIS — Z0000 Encounter for general adult medical examination without abnormal findings: Secondary | ICD-10-CM | POA: Diagnosis not present

## 2016-05-04 ENCOUNTER — Telehealth: Payer: Self-pay | Admitting: *Deleted

## 2016-05-04 ENCOUNTER — Other Ambulatory Visit: Payer: Self-pay | Admitting: Internal Medicine

## 2016-05-04 DIAGNOSIS — M25612 Stiffness of left shoulder, not elsewhere classified: Secondary | ICD-10-CM | POA: Diagnosis not present

## 2016-05-04 DIAGNOSIS — M25512 Pain in left shoulder: Secondary | ICD-10-CM | POA: Diagnosis not present

## 2016-05-04 DIAGNOSIS — M6281 Muscle weakness (generalized): Secondary | ICD-10-CM | POA: Diagnosis not present

## 2016-05-04 MED ORDER — PREDNISONE 50 MG PO TABS: ORAL_TABLET | ORAL | 0 refills | Status: DC

## 2016-05-04 NOTE — Telephone Encounter (Signed)
Oncology Nurse Navigator Documentation  Oncology Nurse Navigator Flowsheets 05/04/2016  Navigator Location [facility]  Referral date to RadOnc/MedOnc -  Navigator Encounter Type Telephone/I received a message from Lenon Oms. NP regarding patient needing pre meds for her scan in Dec.  I updated Dr. Julien Nordmann.  I ordered steroids and gave me instructions to call her back.  I called and updated to take one steroid 13 hours before exam and one 2 hours before exam.  I also instructed her to take benadryl 25 mg 2 hours before exam.  She verbalized understanding of instructions.   Telephone Outgoing Call  Treatment Phase Post-Tx Follow-up  Barriers/Navigation Needs Coordination of Care;Education  Education Other  Interventions Coordination of Care;Education  Coordination of Care Other  Education Method Verbal  Acuity Level 2  Acuity Level 2 Educational needs  Time Spent with Patient 30

## 2016-05-09 DIAGNOSIS — M25612 Stiffness of left shoulder, not elsewhere classified: Secondary | ICD-10-CM | POA: Diagnosis not present

## 2016-05-09 DIAGNOSIS — M25512 Pain in left shoulder: Secondary | ICD-10-CM | POA: Diagnosis not present

## 2016-05-09 DIAGNOSIS — M6281 Muscle weakness (generalized): Secondary | ICD-10-CM | POA: Diagnosis not present

## 2016-05-11 DIAGNOSIS — M25612 Stiffness of left shoulder, not elsewhere classified: Secondary | ICD-10-CM | POA: Diagnosis not present

## 2016-05-11 DIAGNOSIS — M6281 Muscle weakness (generalized): Secondary | ICD-10-CM | POA: Diagnosis not present

## 2016-05-11 DIAGNOSIS — M25512 Pain in left shoulder: Secondary | ICD-10-CM | POA: Diagnosis not present

## 2016-05-18 DIAGNOSIS — M25612 Stiffness of left shoulder, not elsewhere classified: Secondary | ICD-10-CM | POA: Diagnosis not present

## 2016-05-18 DIAGNOSIS — M25512 Pain in left shoulder: Secondary | ICD-10-CM | POA: Diagnosis not present

## 2016-05-18 DIAGNOSIS — M6281 Muscle weakness (generalized): Secondary | ICD-10-CM | POA: Diagnosis not present

## 2016-05-20 DIAGNOSIS — M6281 Muscle weakness (generalized): Secondary | ICD-10-CM | POA: Diagnosis not present

## 2016-05-20 DIAGNOSIS — M25512 Pain in left shoulder: Secondary | ICD-10-CM | POA: Diagnosis not present

## 2016-05-20 DIAGNOSIS — M25612 Stiffness of left shoulder, not elsewhere classified: Secondary | ICD-10-CM | POA: Diagnosis not present

## 2016-05-23 DIAGNOSIS — M25612 Stiffness of left shoulder, not elsewhere classified: Secondary | ICD-10-CM | POA: Diagnosis not present

## 2016-05-23 DIAGNOSIS — M6281 Muscle weakness (generalized): Secondary | ICD-10-CM | POA: Diagnosis not present

## 2016-05-23 DIAGNOSIS — M25512 Pain in left shoulder: Secondary | ICD-10-CM | POA: Diagnosis not present

## 2016-05-24 ENCOUNTER — Encounter: Payer: Self-pay | Admitting: Internal Medicine

## 2016-05-25 DIAGNOSIS — M25612 Stiffness of left shoulder, not elsewhere classified: Secondary | ICD-10-CM | POA: Diagnosis not present

## 2016-05-25 DIAGNOSIS — M6281 Muscle weakness (generalized): Secondary | ICD-10-CM | POA: Diagnosis not present

## 2016-05-25 DIAGNOSIS — M25512 Pain in left shoulder: Secondary | ICD-10-CM | POA: Diagnosis not present

## 2016-05-26 DIAGNOSIS — J0100 Acute maxillary sinusitis, unspecified: Secondary | ICD-10-CM | POA: Diagnosis not present

## 2016-05-27 DIAGNOSIS — M25512 Pain in left shoulder: Secondary | ICD-10-CM | POA: Diagnosis not present

## 2016-05-27 DIAGNOSIS — M6281 Muscle weakness (generalized): Secondary | ICD-10-CM | POA: Diagnosis not present

## 2016-05-27 DIAGNOSIS — M25612 Stiffness of left shoulder, not elsewhere classified: Secondary | ICD-10-CM | POA: Diagnosis not present

## 2016-05-30 ENCOUNTER — Other Ambulatory Visit (HOSPITAL_BASED_OUTPATIENT_CLINIC_OR_DEPARTMENT_OTHER): Payer: Medicare Other

## 2016-05-30 ENCOUNTER — Ambulatory Visit (HOSPITAL_COMMUNITY)
Admission: RE | Admit: 2016-05-30 | Discharge: 2016-05-30 | Disposition: A | Discharge status: Home / Self Care | Payer: Medicare Other | Source: Ambulatory Visit | Attending: Internal Medicine | Admitting: Internal Medicine

## 2016-05-30 DIAGNOSIS — C3491 Malignant neoplasm of unspecified part of right bronchus or lung: Secondary | ICD-10-CM | POA: Insufficient documentation

## 2016-05-30 DIAGNOSIS — C3411 Malignant neoplasm of upper lobe, right bronchus or lung: Secondary | ICD-10-CM | POA: Diagnosis present

## 2016-05-30 LAB — COMPREHENSIVE METABOLIC PANEL
ALT: 19 U/L (ref 0–55)
AST: 26 U/L (ref 5–34)
Albumin: 4.2 g/dL (ref 3.5–5.0)
Alkaline Phosphatase: 80 U/L (ref 40–150)
Anion Gap: 11 mEq/L (ref 3–11)
BUN: 21.2 mg/dL (ref 7.0–26.0)
CO2: 26 mEq/L (ref 22–29)
Calcium: 10.2 mg/dL (ref 8.4–10.4)
Chloride: 104 mEq/L (ref 98–109)
Creatinine: 0.9 mg/dL (ref 0.6–1.1)
EGFR: 70 mL/min/{1.73_m2} — ABNORMAL LOW (ref >90)
Glucose: 114 mg/dl (ref 70–140)
Potassium: 4.9 mEq/L (ref 3.5–5.1)
Sodium: 141 mEq/L (ref 136–145)
Total Bilirubin: 0.48 mg/dL (ref 0.20–1.20)
Total Protein: 7.6 g/dL (ref 6.4–8.3)

## 2016-05-30 LAB — CBC WITH DIFFERENTIAL/PLATELET
BASO%: 0.1 % (ref 0.0–2.0)
Basophils Absolute: 0 10*3/uL (ref 0.0–0.1)
EOS%: 0 % (ref 0.0–7.0)
Eosinophils Absolute: 0 10*3/uL (ref 0.0–0.5)
HCT: 42.8 % (ref 34.8–46.6)
HGB: 14.9 g/dL (ref 11.6–15.9)
LYMPH%: 8.5 % — ABNORMAL LOW (ref 14.0–49.7)
MCH: 30.8 pg (ref 25.1–34.0)
MCHC: 34.8 g/dL (ref 31.5–36.0)
MCV: 88.6 fL (ref 79.5–101.0)
MONO#: 0.5 10*3/uL (ref 0.1–0.9)
MONO%: 5.3 % (ref 0.0–14.0)
NEUT#: 8 10*3/uL — ABNORMAL HIGH (ref 1.5–6.5)
NEUT%: 86.1 % — ABNORMAL HIGH (ref 38.4–76.8)
Platelets: 220 10*3/uL (ref 145–400)
RBC: 4.83 10*6/uL (ref 3.70–5.45)
RDW: 13.5 % (ref 11.2–14.5)
WBC: 9.2 10*3/uL (ref 3.9–10.3)
lymph#: 0.8 10*3/uL — ABNORMAL LOW (ref 0.9–3.3)

## 2016-05-30 MED ORDER — IOPAMIDOL (ISOVUE-300) INJECTION 61%
75.0000 mL | Freq: Once | INTRAVENOUS | Status: AC | PRN
  Administered 2016-05-30: 75 mL via INTRAVENOUS

## 2016-05-30 MED ORDER — SODIUM CHLORIDE 0.9 % IJ SOLN
INTRAMUSCULAR | Status: AC
  Filled 2016-05-30: qty 50

## 2016-05-30 MED ORDER — IOPAMIDOL (ISOVUE-300) INJECTION 61%
INTRAVENOUS | Status: AC
  Filled 2016-05-30: qty 100

## 2016-05-31 ENCOUNTER — Telehealth: Payer: Self-pay

## 2016-05-31 NOTE — Telephone Encounter (Signed)
Pt was calling to ask if there is anything to take for steroid flushing. She was thinking pepcid and benadryl. S/w cyndee Bacon nP and called pt back. S/w he husband that Pepcied and benadryl are for allergic reaction, not the steroid flushing. She will have to let the flush go away over time.

## 2016-06-01 ENCOUNTER — Telehealth: Payer: Self-pay | Admitting: Internal Medicine

## 2016-06-01 ENCOUNTER — Encounter: Payer: Self-pay | Admitting: Internal Medicine

## 2016-06-01 ENCOUNTER — Ambulatory Visit (HOSPITAL_BASED_OUTPATIENT_CLINIC_OR_DEPARTMENT_OTHER): Payer: Medicare Other | Admitting: Internal Medicine

## 2016-06-01 VITALS — BP 111/70 | HR 85 | Temp 97.8°F | Resp 18 | Ht <= 58 in | Wt 94.6 lb

## 2016-06-01 DIAGNOSIS — R05 Cough: Secondary | ICD-10-CM

## 2016-06-01 DIAGNOSIS — C3411 Malignant neoplasm of upper lobe, right bronchus or lung: Secondary | ICD-10-CM | POA: Diagnosis not present

## 2016-06-01 DIAGNOSIS — Z902 Acquired absence of lung [part of]: Secondary | ICD-10-CM

## 2016-06-01 DIAGNOSIS — C3491 Malignant neoplasm of unspecified part of right bronchus or lung: Secondary | ICD-10-CM

## 2016-06-01 DIAGNOSIS — R059 Cough, unspecified: Secondary | ICD-10-CM

## 2016-06-01 NOTE — Telephone Encounter (Signed)
Appointments scheduled per 06/01/16 los. A copy of the appointment schedule and AVS report was given to the patient, per 06/01/16 los.

## 2016-06-01 NOTE — Progress Notes (Signed)
[facility] Telephone:([phone]) [phone]   Fax:([phone]) [place], MD [place] [address]  DIAGNOSIS: Stage IIIA (T2a, N2, M0) non-small cell lung cancer, adenocarcinoma with positive EGFR mutation with deletion in exon 19, presented with right upper lobe lung mass and mediastinal lymphadenopathy, status post surgical resection February 2017.  PRIOR THERAPY: 1) Right VATS with right upper lobectomy with bronchoplasty closure and en bloc resection of a wedge from the superior segment of the right lower lobe in addition to mediastinal lymph node dissection on 07/30/2015 under the care of Dr. Roxan Hockey. 2) Adjuvant systemic chemotherapy with cisplatin 75 MG/M2 and Alimta 500 MG/M2 every 3 weeks. First dose was given on 09/10/2015. Status post 4 cycles. 3) adjuvant radiotherapy under the care of Dr. Tammi Klippel.  CURRENT THERAPY: Observation.  INTERVAL HISTORY: [name] West 65 y.o. female returns to the clinic today for three-month follow-up visit. The patient is feeling fine today with no specific complaints except for mild cough and postnasal drainage. She denied having any chest pain, shortness of breath or hemoptysis. She denied having any significant weight loss or night sweats. She has no fever or chills. She had repeat CT scan of the chest performed recently and she is here for evaluation and discussion of her scan results.  MEDICAL HISTORY: Past Medical History:  Diagnosis Date  . Anxiety   . Arthritis    shoulders  . Complication of anesthesia    nausea  . Cough 09/17/2015  . Encounter for antineoplastic chemotherapy 10/22/2015  . Environmental and seasonal allergies   . Family history of adverse reaction to anesthesia    Patients grandmother died while having hand surgery; pt unsure of cause  . Oral thrush 10/22/2015  . Ovarian cyst   . PONV (postoperative nausea and vomiting)     ALLERGIES:  is allergic to  avelox [moxifloxacin hcl in nacl]; cephalosporins; clindamycin/lincomycin; penicillins; macrobid [nitrofurantoin monohyd macro]; omnipaque [iohexol]; adhesive [tape]; doxycycline; lidoderm [lidocaine]; pseudoephedrine; and sulfa antibiotics.  MEDICATIONS:  Current Outpatient Prescriptions  Medication Sig Dispense Refill  . acetaminophen (TYLENOL) 500 MG tablet Take 2 tablets (1,000 mg total) by mouth every 6 (six) hours as needed. 30 tablet 0  . Azelaic Acid (FINACEA) 15 % cream Apply 1 application topically 2 (two) times daily. After skin is thoroughly washed and patted dry, gently but thoroughly massage a thin film of azelaic acid cream into the affected area twice daily, in the morning and evening.    Marland Kitchen azithromycin (ZITHROMAX) 250 MG tablet     . Brimonidine Tartrate (MIRVASO) 0.33 % GEL Apply 1 application topically daily.    . cetirizine (ZYRTEC) 10 MG tablet Take 10 mg by mouth daily.    . cholecalciferol (VITAMIN D) 1000 UNITS tablet Take 1,000 Units by mouth daily.    . citalopram (CELEXA) 10 MG tablet Take 10 mg by mouth every morning.    Marland Kitchen LORazepam (ATIVAN) 0.5 MG tablet Take 0.5 mg by mouth as needed. Reported on 10/07/2015    . Multiple Vitamin (MULTIVITAMIN WITH MINERALS) TABS Take 1 tablet by mouth daily.    . polyethylene glycol powder (MIRALAX) powder Take 255 g by mouth once. 255 g 0  . predniSONE (DELTASONE) 20 MG tablet Take 1 tablet (20 mg total) by mouth daily with breakfast. Take 3 tabs (60 mg) on day of CT scan. Take 2 tabs (40 mg) day after scan. Take 1 tab (20 mg) on day 3,  then stop. (Patient not taking: Reported on 03/10/2016) 6 tablet 0  . predniSONE (DELTASONE) 50 MG tablet One tablet by mouth 13 hours and then 2 hours before CT scan. 2 tablet 0  . Pseudoephedrine HCl (SUPHEDRINE PO) Take by mouth.    . Wound Cleansers (RADIAPLEX EX) Apply topically.    Marland Kitchen zolpidem (AMBIEN) 5 MG tablet Take 5 mg by mouth at bedtime as needed for sleep. Reported on 01/01/2016     No  current facility-administered medications for this visit.     SURGICAL HISTORY:  Past Surgical History:  Procedure Laterality Date  . CESAREAN SECTION  1984  . COLONOSCOPY WITH PROPOFOL N/A 09/27/2012   Procedure: COLONOSCOPY WITH PROPOFOL;  Surgeon: Garlan Fair, MD;  Location: [facility];  Service: Endoscopy;  Laterality: N/A;  . Fibroid Removed    . Right Rotator Cuff Right   . VIDEO ASSISTED THORACOSCOPY (VATS)/ LOBECTOMY Right 07/30/2015   Procedure: RIGHT VIDEO ASSISTED THORACOSCOPY (VATS)/RIGHT UPPER LOBECTOMY;  Surgeon: Melrose Nakayama, MD;  Location: [facility];  Service: Thoracic;  Laterality: Right;  Marland Kitchen VIDEO BRONCHOSCOPY WITH ENDOBRONCHIAL NAVIGATION N/A 07/15/2015   Procedure: VIDEO BRONCHOSCOPY WITH ENDOBRONCHIAL NAVIGATION with Biopsy;  Surgeon: Collene Gobble, MD;  Location: [facility];  Service: Thoracic;  Laterality: N/A;    REVIEW OF SYSTEMS:  A comprehensive review of systems was negative except for: Respiratory: positive for cough   PHYSICAL EXAMINATION: General appearance: alert, cooperative and no distress Head: Normocephalic, without obvious abnormality, atraumatic Neck: no adenopathy, no JVD, supple, symmetrical, trachea midline and thyroid not enlarged, symmetric, no tenderness/mass/nodules Lymph nodes: Cervical, supraclavicular, and axillary nodes normal. Resp: clear to auscultation bilaterally Back: symmetric, no curvature. ROM normal. No CVA tenderness. Cardio: regular rate and rhythm, S1, S2 normal, no murmur, click, rub or gallop GI: soft, non-tender; bowel sounds normal; no masses,  no organomegaly Extremities: extremities normal, atraumatic, no cyanosis or edema  ECOG PERFORMANCE STATUS: 1 - Symptomatic but completely ambulatory  There were no vitals taken for this visit.  LABORATORY DATA: Lab Results  Component Value Date   WBC 9.2 05/30/2016   HGB 14.9 05/30/2016   HCT 42.8 05/30/2016   MCV 88.6 05/30/2016   PLT 220 05/30/2016      Chemistry        Component Value Date/Time   NA 141 05/30/2016 0949   K 4.9 05/30/2016 0949   CL 97 (L) 08/14/2015 2201   CO2 26 05/30/2016 0949   BUN 21.2 05/30/2016 0949   CREATININE 0.9 05/30/2016 0949      Component Value Date/Time   CALCIUM 10.2 05/30/2016 0949   ALKPHOS 80 05/30/2016 0949   AST 26 05/30/2016 0949   ALT 19 05/30/2016 0949   BILITOT 0.48 05/30/2016 0949       RADIOGRAPHIC STUDIES: Ct Chest W Contrast  Result Date: 05/30/2016 CLINICAL DATA:  Followup right lung non-small-cell carcinoma. Recently completed radiation therapy. Previous right upper lobectomy. EXAM: CT CHEST WITH CONTRAST TECHNIQUE: Multidetector CT imaging of the chest was performed during intravenous contrast administration. CONTRAST:  30m ISOVUE-300 IOPAMIDOL (ISOVUE-300) INJECTION 61% COMPARISON:  03/01/2016 FINDINGS: Cardiovascular:  No acute findings. Mediastinum/Nodes: No masses or pathologically enlarged lymph nodes identified. Lungs/Pleura: Postop changes from previous left upper lobectomy remains stable. Previously seen sub-solid pulmonary nodule in right upper lung field has decreased in size with resolution of 6 mm solid nodular component since prior study. This is consistent with resolving inflammatory or infectious etiology. Other scattered sub-cm bilateral pulmonary nodules remain stable.  No new or enlarging pulmonary nodules or masses identified. No evidence of pleural effusion. Upper Abdomen:  Unremarkable. Musculoskeletal:  No suspicious bone lesions. IMPRESSION: Near complete resolution of sub-solid nodule in right upper lung field, consistent with resolving inflammatory or infectious etiology. Other tiny sub-cm bilateral pulmonary nodules remain stable. No evidence of recurrent or metastatic carcinoma. Electronically Signed   By: Earle Gell M.D.   On: 05/30/2016 12:55    ASSESSMENT AND PLAN: This is a very pleasant 65 years old white female with stage IIIa non-small cell lung cancer, adenocarcinoma  with positive EGFR mutation. She is status post right upper lobectomy with lymph node dissection followed by adjuvant systemic chemotherapy and adjuvant radiotherapy. She is currently on observation and the recent CT scan of the chest showed no evidence for disease recurrence. I discussed the scan results with the patient and her husband. I recommended for her to continue on observation with repeat CT scan of the chest in 3 months. She was advised to call immediately if she has any concerning symptoms in the interval. The patient voices understanding of current disease status and treatment options and is in agreement with the current care plan.  All questions were answered. The patient knows to call the clinic with any problems, questions or concerns. We can certainly see the patient much sooner if necessary.  I spent 10 minutes counseling the patient face to face. The total time spent in the appointment was 15 minutes.  Disclaimer: This note was dictated with voice recognition software. Similar sounding words can inadvertently be transcribed and may not be corrected upon review.

## 2016-06-02 DIAGNOSIS — M25612 Stiffness of left shoulder, not elsewhere classified: Secondary | ICD-10-CM | POA: Diagnosis not present

## 2016-06-02 DIAGNOSIS — M6281 Muscle weakness (generalized): Secondary | ICD-10-CM | POA: Diagnosis not present

## 2016-06-02 DIAGNOSIS — M25512 Pain in left shoulder: Secondary | ICD-10-CM | POA: Diagnosis not present

## 2016-06-03 DIAGNOSIS — M25612 Stiffness of left shoulder, not elsewhere classified: Secondary | ICD-10-CM | POA: Diagnosis not present

## 2016-06-03 DIAGNOSIS — M6281 Muscle weakness (generalized): Secondary | ICD-10-CM | POA: Diagnosis not present

## 2016-06-03 DIAGNOSIS — M25512 Pain in left shoulder: Secondary | ICD-10-CM | POA: Diagnosis not present

## 2016-06-07 DIAGNOSIS — M25512 Pain in left shoulder: Secondary | ICD-10-CM | POA: Diagnosis not present

## 2016-06-07 DIAGNOSIS — M6281 Muscle weakness (generalized): Secondary | ICD-10-CM | POA: Diagnosis not present

## 2016-06-07 DIAGNOSIS — M25612 Stiffness of left shoulder, not elsewhere classified: Secondary | ICD-10-CM | POA: Diagnosis not present

## 2016-06-10 DIAGNOSIS — M25612 Stiffness of left shoulder, not elsewhere classified: Secondary | ICD-10-CM | POA: Diagnosis not present

## 2016-06-10 DIAGNOSIS — M6281 Muscle weakness (generalized): Secondary | ICD-10-CM | POA: Diagnosis not present

## 2016-06-10 DIAGNOSIS — M25512 Pain in left shoulder: Secondary | ICD-10-CM | POA: Diagnosis not present

## 2016-06-14 DIAGNOSIS — M25612 Stiffness of left shoulder, not elsewhere classified: Secondary | ICD-10-CM | POA: Diagnosis not present

## 2016-06-14 DIAGNOSIS — M8588 Other specified disorders of bone density and structure, other site: Secondary | ICD-10-CM | POA: Diagnosis not present

## 2016-06-14 DIAGNOSIS — M85851 Other specified disorders of bone density and structure, right thigh: Secondary | ICD-10-CM | POA: Diagnosis not present

## 2016-06-14 DIAGNOSIS — M25512 Pain in left shoulder: Secondary | ICD-10-CM | POA: Diagnosis not present

## 2016-06-14 DIAGNOSIS — M6281 Muscle weakness (generalized): Secondary | ICD-10-CM | POA: Diagnosis not present

## 2016-06-15 DIAGNOSIS — M6281 Muscle weakness (generalized): Secondary | ICD-10-CM | POA: Diagnosis not present

## 2016-06-15 DIAGNOSIS — N83292 Other ovarian cyst, left side: Secondary | ICD-10-CM | POA: Diagnosis not present

## 2016-06-15 DIAGNOSIS — M25512 Pain in left shoulder: Secondary | ICD-10-CM | POA: Diagnosis not present

## 2016-06-15 DIAGNOSIS — M25612 Stiffness of left shoulder, not elsewhere classified: Secondary | ICD-10-CM | POA: Diagnosis not present

## 2016-06-15 NOTE — Patient Instructions (Signed)
Your procedure is scheduled on:  Thursday, Jan. 18, 2018  Enter through the [employer] [facility] at:  6:00 AM  Pick up the phone at the desk and dial [phone].  Call this number if you have problems the morning of surgery: [phone].  Remember: Do NOT eat food or drink after:  Midnight Wednesday, Jan. 17, 2018  Take these medicines the morning of surgery with a SIP OF WATER:  Celexa  Stop ALL herbal medications at this time   Do NOT wear jewelry (body piercing), metal hair clips/bobby pins, make-up, or nail polish. Do NOT wear lotions, powders, or perfumes.  You may wear deodorant. Do NOT shave for 48 hours prior to surgery. Do NOT bring valuables to the hospital. Contacts, dentures, or bridgework may not be worn into surgery.  Leave suitcase in car.  After surgery it may be brought to your room.  For patients admitted to the hospital, checkout time is 11:00 AM the day of discharge.

## 2016-06-16 ENCOUNTER — Encounter (HOSPITAL_COMMUNITY): Payer: Self-pay

## 2016-06-16 ENCOUNTER — Encounter (HOSPITAL_COMMUNITY)
Admission: RE | Admit: 2016-06-16 | Discharge: 2016-06-16 | Disposition: A | Discharge status: Home / Self Care | Payer: Medicare Other | Source: Ambulatory Visit | Attending: Obstetrics and Gynecology | Admitting: Obstetrics and Gynecology

## 2016-06-16 DIAGNOSIS — Z01812 Encounter for preprocedural laboratory examination: Secondary | ICD-10-CM | POA: Diagnosis not present

## 2016-06-16 HISTORY — DX: Raynaud's syndrome without gangrene: I73.00

## 2016-06-16 HISTORY — DX: Malignant (primary) neoplasm, unspecified: C80.1

## 2016-06-16 LAB — CBC
HCT: 37.3 % (ref 36.0–46.0)
Hemoglobin: 13.1 g/dL (ref 12.0–15.0)
MCH: 31.3 pg (ref 26.0–34.0)
MCHC: 35.1 g/dL (ref 30.0–36.0)
MCV: 89 fL (ref 78.0–100.0)
Platelets: 198 10*3/uL (ref 150–400)
RBC: 4.19 MIL/uL (ref 3.87–5.11)
RDW: 13.6 % (ref 11.5–15.5)
WBC: 9.1 10*3/uL (ref 4.0–10.5)

## 2016-06-17 DIAGNOSIS — M6281 Muscle weakness (generalized): Secondary | ICD-10-CM | POA: Diagnosis not present

## 2016-06-17 DIAGNOSIS — M25612 Stiffness of left shoulder, not elsewhere classified: Secondary | ICD-10-CM | POA: Diagnosis not present

## 2016-06-17 DIAGNOSIS — M25512 Pain in left shoulder: Secondary | ICD-10-CM | POA: Diagnosis not present

## 2016-06-27 DIAGNOSIS — M25612 Stiffness of left shoulder, not elsewhere classified: Secondary | ICD-10-CM | POA: Diagnosis not present

## 2016-06-27 DIAGNOSIS — M25512 Pain in left shoulder: Secondary | ICD-10-CM | POA: Diagnosis not present

## 2016-06-27 DIAGNOSIS — M6281 Muscle weakness (generalized): Secondary | ICD-10-CM | POA: Diagnosis not present

## 2016-06-28 DIAGNOSIS — M25612 Stiffness of left shoulder, not elsewhere classified: Secondary | ICD-10-CM | POA: Diagnosis not present

## 2016-06-28 DIAGNOSIS — M6281 Muscle weakness (generalized): Secondary | ICD-10-CM | POA: Diagnosis not present

## 2016-06-28 DIAGNOSIS — M25512 Pain in left shoulder: Secondary | ICD-10-CM | POA: Diagnosis not present

## 2016-06-28 NOTE — H&P (Signed)
65 year old G 1 P 2 with persistent left adnexal cyst.  In December 2016, she had a 5.5 cm simple appearing cyst on the left ovary. We were planning on removing the cyst and unfortunately she was diagnosed with lung cancer. She had a lobectomy and she is now cancer free.  She had an ultrasound in November 2017 and the cyst measures 5.6 x 5.4 x 5.6 cm  Past Medical History:  Diagnosis Date  . Anxiety   . Arthritis    shoulders  . Cancer ([place])    Lung Cancer  . Complication of anesthesia    nausea  . Cough 09/17/2015  . Encounter for antineoplastic chemotherapy 10/22/2015  . Environmental and seasonal allergies   . Family history of adverse reaction to anesthesia    Patients grandmother died while having hand surgery; pt unsure of cause  . Oral thrush 10/22/2015  . Ovarian cyst   . PONV (postoperative nausea and vomiting)   . Raynaud disease    Past Surgical History:  Procedure Laterality Date  . CESAREAN SECTION  1984  . COLONOSCOPY WITH PROPOFOL N/A 09/27/2012   Procedure: COLONOSCOPY WITH PROPOFOL;  Surgeon: Garlan Fair, MD;  Location: [facility];  Service: Endoscopy;  Laterality: N/A;  . Fibroid Removed    . Right Rotator Cuff Right   . VIDEO ASSISTED THORACOSCOPY (VATS)/ LOBECTOMY Right 07/30/2015   Procedure: RIGHT VIDEO ASSISTED THORACOSCOPY (VATS)/RIGHT UPPER LOBECTOMY;  Surgeon: Melrose Nakayama, MD;  Location: [facility];  Service: Thoracic;  Laterality: Right;  Marland Kitchen VIDEO BRONCHOSCOPY WITH ENDOBRONCHIAL NAVIGATION N/A 07/15/2015   Procedure: VIDEO BRONCHOSCOPY WITH ENDOBRONCHIAL NAVIGATION with Biopsy;  Surgeon: Collene Gobble, MD;  Location: [facility];  Service: Thoracic;  Laterality: N/A;   Prior to Admission medications   Medication Sig Start Date End Date Taking? Authorizing Provider  acetaminophen (TYLENOL) 500 MG tablet Take 2 tablets (1,000 mg total) by mouth every 6 (six) hours as needed. 08/04/15  Yes Erin R Barrett, PA-C  Azelaic Acid (FINACEA) 15 % cream Apply 1  application topically 2 (two) times daily. After skin is thoroughly washed and patted dry, gently but thoroughly massage a thin film of azelaic acid cream into the affected area twice daily, in the morning and evening.   Yes Historical Provider, MD  Brimonidine Tartrate (MIRVASO) 0.33 % GEL Apply 1 application topically daily.   Yes Historical Provider, MD  cholecalciferol (VITAMIN D) 1000 UNITS tablet Take 1,000 Units by mouth daily.   Yes Historical Provider, MD  citalopram (CELEXA) 10 MG tablet Take 10 mg by mouth every morning.   Yes Historical Provider, MD  EPINEPHrine 0.3 mg/0.3 mL IJ SOAJ injection Inject 0.3 mg into the muscle as needed (For allergic reaction).   Yes Historical Provider, MD  fexofenadine (ALLEGRA) 180 MG tablet Take 180 mg by mouth daily.   Yes Historical Provider, MD  ibuprofen (ADVIL,MOTRIN) 200 MG tablet Take 200 mg by mouth every 6 (six) hours as needed for mild pain or moderate pain.   Yes Historical Provider, MD  LORazepam (ATIVAN) 0.5 MG tablet Take 0.25 mg by mouth at bedtime as needed for sleep. Reported on 10/07/2015 07/21/15  Yes Historical Provider, MD  Multiple Vitamin (MULTIVITAMIN WITH MINERALS) TABS Take 1 tablet by mouth daily.   Yes Historical Provider, MD  phenylephrine (SUDAFED PE) 10 MG TABS tablet Take 10 mg by mouth daily as needed.   Yes Historical Provider, MD  polyethylene glycol (MIRALAX / GLYCOLAX) packet Take 17 g by  mouth daily as needed for mild constipation.   Yes Historical Provider, MD  zolpidem (AMBIEN) 5 MG tablet Take 5 mg by mouth at bedtime as needed for sleep. Reported on 01/01/2016   Yes Historical Provider, MD   Allergies Avelox [moxifloxacin hcl in nacl]; Cephalosporins; Clindamycin/lincomycin; Penicillins; Macrobid [nitrofurantoin monohyd macro]; Omnipaque [iohexol]; Adhesive [tape]; Doxycycline; Lidoderm [lidocaine]; Pseudoephedrine; and Sulfa antibiotics  ROS negative  No family history on file.  Afebrile Vital signs  stable General alert and oriented Lung CTAB Car RRR  Abdomen is soft and non tender Pelvic above cyst  IMPRESSION: Persistent left adnexal cyst  PLAN: Minilaparotomy left salpingoophorectomy Risks reviewed Consent signed

## 2016-06-30 ENCOUNTER — Ambulatory Visit (HOSPITAL_COMMUNITY)
Admission: RE | Admit: 2016-06-30 | Payer: Medicare Other | Source: Ambulatory Visit | Admitting: Obstetrics and Gynecology

## 2016-07-01 DIAGNOSIS — M25612 Stiffness of left shoulder, not elsewhere classified: Secondary | ICD-10-CM | POA: Diagnosis not present

## 2016-07-01 DIAGNOSIS — M25512 Pain in left shoulder: Secondary | ICD-10-CM | POA: Diagnosis not present

## 2016-07-01 DIAGNOSIS — M6281 Muscle weakness (generalized): Secondary | ICD-10-CM | POA: Diagnosis not present

## 2016-07-05 DIAGNOSIS — M25612 Stiffness of left shoulder, not elsewhere classified: Secondary | ICD-10-CM | POA: Diagnosis not present

## 2016-07-05 DIAGNOSIS — M25512 Pain in left shoulder: Secondary | ICD-10-CM | POA: Diagnosis not present

## 2016-07-05 DIAGNOSIS — M6281 Muscle weakness (generalized): Secondary | ICD-10-CM | POA: Diagnosis not present

## 2016-07-07 DIAGNOSIS — M6281 Muscle weakness (generalized): Secondary | ICD-10-CM | POA: Diagnosis not present

## 2016-07-07 DIAGNOSIS — M25512 Pain in left shoulder: Secondary | ICD-10-CM | POA: Diagnosis not present

## 2016-07-07 DIAGNOSIS — M25612 Stiffness of left shoulder, not elsewhere classified: Secondary | ICD-10-CM | POA: Diagnosis not present

## 2016-07-12 DIAGNOSIS — M25612 Stiffness of left shoulder, not elsewhere classified: Secondary | ICD-10-CM | POA: Diagnosis not present

## 2016-07-12 DIAGNOSIS — M6281 Muscle weakness (generalized): Secondary | ICD-10-CM | POA: Diagnosis not present

## 2016-07-12 DIAGNOSIS — M25512 Pain in left shoulder: Secondary | ICD-10-CM | POA: Diagnosis not present

## 2016-07-12 NOTE — Progress Notes (Signed)
[name] West had PAT visit 06/16/2016, does not need to have another PAT visit per Dr. Royce Macadamia.

## 2016-07-15 DIAGNOSIS — M25612 Stiffness of left shoulder, not elsewhere classified: Secondary | ICD-10-CM | POA: Diagnosis not present

## 2016-07-15 DIAGNOSIS — M6281 Muscle weakness (generalized): Secondary | ICD-10-CM | POA: Diagnosis not present

## 2016-07-15 DIAGNOSIS — M25512 Pain in left shoulder: Secondary | ICD-10-CM | POA: Diagnosis not present

## 2016-07-19 ENCOUNTER — Encounter (HOSPITAL_COMMUNITY): Payer: Self-pay | Admitting: Anesthesiology

## 2016-07-19 NOTE — Anesthesia Preprocedure Evaluation (Addendum)
Anesthesia Evaluation  Patient identified by MRN, date of birth, ID band Patient awake    Reviewed: NPO status , Patient's Chart, lab work & pertinent test results, reviewed documented beta blocker date and time   Airway Mallampati: I       Dental no notable dental hx.    Pulmonary    Pulmonary exam normal        Cardiovascular Normal cardiovascular exam     Neuro/Psych negative neurological ROS  negative psych ROS   GI/Hepatic negative GI ROS, Neg liver ROS,   Endo/Other  negative endocrine ROS  Renal/GU negative Renal ROS  negative genitourinary   Musculoskeletal   Abdominal Normal abdominal exam  (+)   Peds  Hematology negative hematology ROS (+)   Anesthesia Other Findings   Reproductive/Obstetrics negative OB ROS                             Anesthesia Physical Anesthesia Plan  ASA: II  Anesthesia Plan: Spinal   Post-op Pain Management:    Induction:   Airway Management Planned:   Additional Equipment:   Intra-op Plan:   Post-operative Plan:   Informed Consent: I have reviewed the patients History and Physical, chart, labs and discussed the procedure including the risks, benefits and alternatives for the proposed anesthesia with the patient or authorized representative who has indicated his/her understanding and acceptance.     Plan Discussed with: CRNA and Surgeon  Anesthesia Plan Comments:        Anesthesia Quick Evaluation

## 2016-07-20 ENCOUNTER — Inpatient Hospital Stay (HOSPITAL_COMMUNITY)
Admission: RE | Admit: 2016-07-20 | Discharge: 2016-07-21 | DRG: 743 | Disposition: A | Discharge status: Home / Self Care | Payer: Medicare Other | Source: Ambulatory Visit | Attending: Obstetrics and Gynecology | Admitting: Obstetrics and Gynecology

## 2016-07-20 ENCOUNTER — Encounter (HOSPITAL_COMMUNITY): Admission: RE | Payer: Self-pay | Source: Ambulatory Visit

## 2016-07-20 ENCOUNTER — Encounter (HOSPITAL_COMMUNITY)
Admission: RE | Disposition: A | Discharge status: Home / Self Care | Payer: Self-pay | Source: Ambulatory Visit | Attending: Obstetrics and Gynecology

## 2016-07-20 ENCOUNTER — Encounter (HOSPITAL_COMMUNITY): Payer: Self-pay | Admitting: *Deleted

## 2016-07-20 ENCOUNTER — Inpatient Hospital Stay (HOSPITAL_COMMUNITY): Payer: Medicare Other | Admitting: Anesthesiology

## 2016-07-20 DIAGNOSIS — Z85118 Personal history of other malignant neoplasm of bronchus and lung: Secondary | ICD-10-CM

## 2016-07-20 DIAGNOSIS — D271 Benign neoplasm of left ovary: Secondary | ICD-10-CM | POA: Diagnosis not present

## 2016-07-20 DIAGNOSIS — N83202 Unspecified ovarian cyst, left side: Secondary | ICD-10-CM | POA: Diagnosis present

## 2016-07-20 HISTORY — PX: LAPAROTOMY: SHX154

## 2016-07-20 SURGERY — LAPAROTOMY, EXPLORATORY
Anesthesia: General | Laterality: Left

## 2016-07-20 SURGERY — LAPAROTOMY
Anesthesia: Spinal | Site: Abdomen | Laterality: Left

## 2016-07-20 MED ORDER — MENTHOL 3 MG MT LOZG: 1.0000 | LOZENGE | OROMUCOSAL | Status: DC | PRN

## 2016-07-20 MED ORDER — ONDANSETRON 8 MG PO TBDP
8.0000 mg | ORAL_TABLET | Freq: Three times a day (TID) | ORAL | Status: DC | PRN
Start: 2016-07-20 — End: 2016-07-21
  Administered 2016-07-20: 8 mg via ORAL
  Filled 2016-07-20 (×2): qty 1

## 2016-07-20 MED ORDER — TRAMADOL HCL 50 MG PO TABS: 50.0000 mg | ORAL_TABLET | Freq: Four times a day (QID) | ORAL | Status: DC | PRN

## 2016-07-20 MED ORDER — ONDANSETRON HCL 4 MG/2ML IJ SOLN
INTRAMUSCULAR | Status: AC
  Filled 2016-07-20: qty 2

## 2016-07-20 MED ORDER — FENTANYL CITRATE (PF) 100 MCG/2ML IJ SOLN
INTRAMUSCULAR | Status: DC | PRN
  Administered 2016-07-20 (×3): 25 ug via INTRAVENOUS

## 2016-07-20 MED ORDER — METRONIDAZOLE IN NACL 5-0.79 MG/ML-% IV SOLN
500.0000 mg | INTRAVENOUS | Status: AC
  Administered 2016-07-20: 500 mg via INTRAVENOUS
  Filled 2016-07-20: qty 100

## 2016-07-20 MED ORDER — PROPOFOL 500 MG/50ML IV EMUL
INTRAVENOUS | Status: DC | PRN
Start: 2016-07-20 — End: 2016-07-20
  Administered 2016-07-20: 75 ug/kg/min via INTRAVENOUS

## 2016-07-20 MED ORDER — KETOROLAC TROMETHAMINE 30 MG/ML IJ SOLN
30.0000 mg | Freq: Once | INTRAMUSCULAR | Status: AC
  Administered 2016-07-20: 30 mg via INTRAVENOUS

## 2016-07-20 MED ORDER — HYDROMORPHONE HCL 1 MG/ML IJ SOLN
1.0000 mg | Freq: Once | INTRAMUSCULAR | Status: AC
  Administered 2016-07-20: 1 mg via INTRAVENOUS

## 2016-07-20 MED ORDER — HYDROMORPHONE HCL 1 MG/ML IJ SOLN
1.0000 mg | Freq: Once | INTRAMUSCULAR | Status: AC
  Administered 2016-07-20: 1 mg via INTRAVENOUS
  Filled 2016-07-20: qty 1

## 2016-07-20 MED ORDER — DEXTROSE 5 % IV SOLN
5.0000 mg/kg | Freq: Once | INTRAVENOUS | Status: AC
  Administered 2016-07-20: 210 mg via INTRAVENOUS
  Filled 2016-07-20: qty 5.25

## 2016-07-20 MED ORDER — FENTANYL CITRATE (PF) 100 MCG/2ML IJ SOLN
INTRAMUSCULAR | Status: AC
  Administered 2016-07-20: 50 ug via INTRAVENOUS
  Filled 2016-07-20: qty 2

## 2016-07-20 MED ORDER — CIPROFLOXACIN IN D5W 400 MG/200ML IV SOLN: 400.0000 mg | INTRAVENOUS | Status: DC

## 2016-07-20 MED ORDER — FENTANYL CITRATE (PF) 100 MCG/2ML IJ SOLN
INTRAMUSCULAR | Status: AC
  Filled 2016-07-20: qty 2

## 2016-07-20 MED ORDER — BUPIVACAINE HCL (PF) 0.25 % IJ SOLN
INTRAMUSCULAR | Status: AC
  Filled 2016-07-20: qty 30

## 2016-07-20 MED ORDER — KETOROLAC TROMETHAMINE 30 MG/ML IJ SOLN: 30.0000 mg | Freq: Once | INTRAMUSCULAR | Status: DC

## 2016-07-20 MED ORDER — IBUPROFEN 200 MG PO TABS: 200.0000 mg | ORAL_TABLET | Freq: Four times a day (QID) | ORAL | Status: DC | PRN

## 2016-07-20 MED ORDER — LACTATED RINGERS IV SOLN
INTRAVENOUS | Status: DC
  Administered 2016-07-20 (×2): via INTRAVENOUS

## 2016-07-20 MED ORDER — MEPERIDINE HCL 25 MG/ML IJ SOLN: 6.2500 mg | INTRAMUSCULAR | Status: DC | PRN

## 2016-07-20 MED ORDER — IBUPROFEN 600 MG PO TABS: 600.0000 mg | ORAL_TABLET | Freq: Four times a day (QID) | ORAL | Status: DC | PRN

## 2016-07-20 MED ORDER — KETOROLAC TROMETHAMINE 30 MG/ML IJ SOLN
30.0000 mg | Freq: Once | INTRAMUSCULAR | Status: AC
  Administered 2016-07-21: 30 mg via INTRAVENOUS
  Filled 2016-07-20: qty 1

## 2016-07-20 MED ORDER — MIDAZOLAM HCL 2 MG/2ML IJ SOLN
INTRAMUSCULAR | Status: DC | PRN
  Administered 2016-07-20 (×2): 1 mg via INTRAVENOUS

## 2016-07-20 MED ORDER — ONDANSETRON HCL 4 MG/2ML IJ SOLN
INTRAMUSCULAR | Status: DC | PRN
  Administered 2016-07-20: 4 mg via INTRAVENOUS

## 2016-07-20 MED ORDER — FENTANYL CITRATE (PF) 100 MCG/2ML IJ SOLN
25.0000 ug | INTRAMUSCULAR | Status: DC | PRN
  Administered 2016-07-20 (×3): 50 ug via INTRAVENOUS

## 2016-07-20 MED ORDER — BUPIVACAINE IN DEXTROSE 0.75-8.25 % IT SOLN
INTRATHECAL | Status: DC | PRN
  Administered 2016-07-20: 1.4 mL via INTRATHECAL

## 2016-07-20 MED ORDER — HYDROMORPHONE HCL 1 MG/ML IJ SOLN
1.0000 mg | INTRAMUSCULAR | Status: DC | PRN
  Administered 2016-07-20: 1 mg via INTRAVENOUS
  Filled 2016-07-20: qty 1

## 2016-07-20 MED ORDER — MIDAZOLAM HCL 2 MG/2ML IJ SOLN
INTRAMUSCULAR | Status: AC
  Filled 2016-07-20: qty 2

## 2016-07-20 MED ORDER — PROMETHAZINE HCL 25 MG/ML IJ SOLN
INTRAMUSCULAR | Status: AC
  Filled 2016-07-20: qty 1

## 2016-07-20 MED ORDER — PROPOFOL 10 MG/ML IV BOLUS
INTRAVENOUS | Status: AC
  Filled 2016-07-20: qty 20

## 2016-07-20 MED ORDER — LACTATED RINGERS IV SOLN
INTRAVENOUS | Status: DC
  Administered 2016-07-20: 07:00:00 via INTRAVENOUS

## 2016-07-20 MED ORDER — ACETAMINOPHEN 160 MG/5ML PO SOLN
650.0000 mg | Freq: Four times a day (QID) | ORAL | Status: DC | PRN
  Administered 2016-07-20: 650 mg via ORAL
  Filled 2016-07-20: qty 20.3

## 2016-07-20 MED ORDER — HYDROMORPHONE HCL 1 MG/ML IJ SOLN
INTRAMUSCULAR | Status: AC
  Filled 2016-07-20: qty 1

## 2016-07-20 MED ORDER — SCOPOLAMINE 1 MG/3DAYS TD PT72
MEDICATED_PATCH | TRANSDERMAL | Status: AC
  Filled 2016-07-20: qty 1

## 2016-07-20 MED ORDER — PROMETHAZINE HCL 25 MG/ML IJ SOLN
6.2500 mg | INTRAMUSCULAR | Status: DC | PRN
  Administered 2016-07-20: 12.5 mg via INTRAVENOUS

## 2016-07-20 MED ORDER — KETOROLAC TROMETHAMINE 30 MG/ML IJ SOLN
INTRAMUSCULAR | Status: AC
  Filled 2016-07-20: qty 1

## 2016-07-20 SURGICAL SUPPLY — 31 items
APPLICATOR CHLORAPREP 3ML ORNG (MISCELLANEOUS) ×2 IMPLANT
CANISTER SUCT 3000ML (MISCELLANEOUS) ×2 IMPLANT
CLOTH BEACON ORANGE TIMEOUT ST (SAFETY) ×2 IMPLANT
DECANTER SPIKE VIAL GLASS SM (MISCELLANEOUS) IMPLANT
DRAPE WARM FLUID 44X44 (DRAPE) IMPLANT
DRSG OPSITE POSTOP 4X10 (GAUZE/BANDAGES/DRESSINGS) ×2 IMPLANT
GAUZE SPONGE 4X4 16PLY XRAY LF (GAUZE/BANDAGES/DRESSINGS) IMPLANT
GLOVE BIO SURGEON STRL SZ 6.5 (GLOVE) ×2 IMPLANT
GLOVE BIOGEL PI IND STRL 7.0 (GLOVE) ×2 IMPLANT
GLOVE BIOGEL PI INDICATOR 7.0 (GLOVE) ×2
GOWN STRL REUS W/TWL LRG LVL3 (GOWN DISPOSABLE) ×6 IMPLANT
NEEDLE HYPO 22GX1.5 SAFETY (NEEDLE) ×2 IMPLANT
NS IRRIG 1000ML POUR BTL (IV SOLUTION) ×2 IMPLANT
PACK ABDOMINAL GYN (CUSTOM PROCEDURE TRAY) ×2 IMPLANT
PAD OB MATERNITY 4.3X12.25 (PERSONAL CARE ITEMS) ×2 IMPLANT
PENCIL SMOKE EVAC W/HOLSTER (ELECTROSURGICAL) ×2 IMPLANT
PROTECTOR NERVE ULNAR (MISCELLANEOUS) ×2 IMPLANT
SPONGE LAP 18X18 X RAY DECT (DISPOSABLE) IMPLANT
STRIP CLOSURE SKIN 1/2X4 (GAUZE/BANDAGES/DRESSINGS) ×2 IMPLANT
SUT PDS AB 0 CTX 60 (SUTURE) IMPLANT
SUT PLAIN 2 0 XLH (SUTURE) IMPLANT
SUT VIC AB 0 CT1 18XCR BRD8 (SUTURE) IMPLANT
SUT VIC AB 0 CT1 27 (SUTURE) ×4
SUT VIC AB 0 CT1 27XBRD ANBCTR (SUTURE) ×4 IMPLANT
SUT VIC AB 0 CT1 8-18 (SUTURE)
SUT VIC AB 4-0 KS 27 (SUTURE) ×2 IMPLANT
SUT VICRYL 0 TIES 12 18 (SUTURE) ×2 IMPLANT
SYR CONTROL 10ML LL (SYRINGE) IMPLANT
SYRINGE 10CC LL (SYRINGE) ×2 IMPLANT
TOWEL OR 17X24 6PK STRL BLUE (TOWEL DISPOSABLE) ×4 IMPLANT
TRAY FOLEY CATH SILVER 14FR (SET/KITS/TRAYS/PACK) ×2 IMPLANT

## 2016-07-20 NOTE — Anesthesia Procedure Notes (Signed)
Spinal  Patient location during procedure: OR Start time: 07/20/2016 7:35 AM Staffing Anesthesiologist: Josephine Igo Preanesthetic Checklist Completed: patient identified, site marked, surgical consent, pre-op evaluation, timeout performed, IV checked, risks and benefits discussed and monitors and equipment checked Spinal Block Patient position: sitting Prep: site prepped and draped and DuraPrep Patient monitoring: heart rate, cardiac monitor, continuous pulse ox and blood pressure Approach: midline Location: L3-4 Injection technique: single-shot Needle Needle type: Pencan  Needle gauge: 24 G Needle length: 9 cm Needle insertion depth: 4 cm Assessment Sensory level: T6 Additional Notes Patient tolerated procedure well. Adequate sensory level.

## 2016-07-20 NOTE — Anesthesia Postprocedure Evaluation (Signed)
Anesthesia Post Note  Patient: [name] West  Procedure(s) Performed: Procedure(s) (LRB): MINI LAPAROTOMY, REMOVAL OF LEFT OVARIAN CYST, LEFT SALPINGO-OOPHORECTOMY (Left)  Patient location during evaluation: PACU Anesthesia Type: Spinal Level of consciousness: awake Pain management: pain level controlled Respiratory status: spontaneous breathing Cardiovascular status: stable Postop Assessment: no headache, no backache, spinal receding, patient able to bend at knees and no signs of nausea or vomiting Anesthetic complications: no        Last Vitals:  Vitals:   07/20/16 0930 07/20/16 0945  BP: 109/60 123/67  Pulse: 61 73  Resp: 14 12  Temp:      Last Pain:  Vitals:   07/20/16 0945  TempSrc:   PainSc: 5    Pain Goal: Patients Stated Pain Goal: 2 (07/20/16 0609)               HATCHETT JR,JOHN Mateo Flow

## 2016-07-20 NOTE — Brief Op Note (Signed)
07/20/2016  8:25 AM  PATIENT:  [name] West  66 y.o. female  PRE-OPERATIVE DIAGNOSIS:  left ovarian cyst  POST-OPERATIVE DIAGNOSIS:  left ovarian cyst  PROCEDURE:  Procedure(s) with comments: MINI LAPAROTOMY, REMOVAL OF LEFT OVARIAN CYST, LEFT SALPINGO-OOPHORECTOMY (Left) - please moved to 7:30am if spot opens  SURGEON:  Surgeon(s) and Role:    * Dian Queen, MD - Primary  PHYSICIAN ASSISTANT:   ASSISTANTS: none   ANESTHESIA:   spinal  EBL:  Total I/O In: 1000 [I.V.:1000] Out: 20 [Blood:20]  BLOOD ADMINISTERED:none  DRAINS: Urinary Catheter (Foley)   LOCAL MEDICATIONS USED:  NONE  SPECIMEN:  Source of Specimen:  left tube and ovary  DISPOSITION OF SPECIMEN:  PATHOLOGY  COUNTS:  YES  TOURNIQUET:  * No tourniquets in log *  DICTATION: .Other Dictation: Dictation Number [id number]  PLAN OF CARE: Admit to inpatient   PATIENT DISPOSITION:  PACU - hemodynamically stable.   Delay start of Pharmacological VTE agent (>24hrs) due to surgical blood loss or risk of bleeding: not applicable

## 2016-07-20 NOTE — Addendum Note (Signed)
Addendum  created 07/20/16 1508 by Ignacia Bayley, CRNA   Sign clinical note

## 2016-07-20 NOTE — Progress Notes (Addendum)
Assessment of pt done. See flow sheet for details  2000: walked pt around the unit. Pt tolerated it well.   2020: MD notified for dressing change order. Order received to change honey comb dressing.   2255: Dressing changed per order.   Pt complains of ha. Medicated per order.   2325: pt threw up medication with water she drank along with crackers eaten earlier. Gown changed.   2354: MD notified. Orders received for pt to receive toradol '30mg'$   IVPush.

## 2016-07-20 NOTE — Anesthesia Postprocedure Evaluation (Signed)
Anesthesia Post Note  Patient: MAYGAN KOELLER  Procedure(s) Performed: Procedure(s) (LRB): MINI LAPAROTOMY, REMOVAL OF LEFT OVARIAN CYST, LEFT SALPINGO-OOPHORECTOMY (Left)  Patient location during evaluation: [facility] Anesthesia Type: Spinal Level of consciousness: awake Pain management: pain level controlled Vital Signs Assessment: post-procedure vital signs reviewed and stable Respiratory status: spontaneous breathing Cardiovascular status: stable Postop Assessment: no headache, no backache, spinal receding, patient able to bend at knees, no signs of nausea or vomiting and adequate PO intake Anesthetic complications: no        Last Vitals:  Vitals:   07/20/16 1107 07/20/16 1200  BP: (!) 107/54 93/63  Pulse: 65 66  Resp: 16 18  Temp: 36.7 C 36.8 C    Last Pain:  Vitals:   07/20/16 1359  TempSrc:   PainSc: 2    Pain Goal: Patients Stated Pain Goal: 2 (07/20/16 1359)               MULLINS,JANET

## 2016-07-20 NOTE — Progress Notes (Signed)
Patient is doing well. Pain is mild now and "Pressure".  BP 93/63 (BP Location: Right Arm)   Pulse 66   Temp 98.3 F (36.8 C) (Oral)   Resp 18   Ht 4' 11.5" (1.511 m)   Wt 42.6 kg (94 lb)   SpO2 96%   BMI 18.67 kg/m  Urine out put good  Abdomen is soft and non tender  IMPRESSION POD #0 doing well Routine care Remove foley Dilaudid prn  Ibuprofen prn Discharge tomorrow

## 2016-07-20 NOTE — Transfer of Care (Signed)
Immediate Anesthesia Transfer of Care Note  Patient: [name] West  Procedure(s) Performed: Procedure(s) with comments: MINI LAPAROTOMY, REMOVAL OF LEFT OVARIAN CYST, LEFT SALPINGO-OOPHORECTOMY (Left) - please moved to 7:30am if spot opens  Patient Location: PACU  Anesthesia Type:MAC and Spinal  Level of Consciousness: awake, alert , oriented and patient cooperative  Airway & Oxygen Therapy: Patient Spontanous Breathing and Patient connected to nasal cannula oxygen  Post-op Assessment: Report given to RN and Post -op Vital signs reviewed and stable  Post vital signs: Reviewed and stable  Last Vitals:  Vitals:   07/20/16 0609  BP: 111/61  Pulse: 86  Resp: 20  Temp: 36.5 C    Last Pain:  Vitals:   07/20/16 0609  TempSrc: Oral  PainSc: 0-No pain      Patients Stated Pain Goal: 2 (80/99/83 3825)  Complications: No apparent anesthesia complications

## 2016-07-20 NOTE — Op Note (Signed)
[name], [name]                 ACCOUNT NO.:  [id number]  MEDICAL RECORD NO.:  [id number]  LOCATION:                                 FACILITY:  PHYSICIAN:  [place] Grewal, M.D.DATE OF BIRTH:  10/27/1950  DATE OF PROCEDURE:  07/20/2016 DATE OF DISCHARGE:                              OPERATIVE REPORT   PREOPERATIVE DIAGNOSIS:  Left ovarian cyst.  POSTOPERATIVE DIAGNOSIS:  Left ovarian cyst.  PROCEDURE:  Mini-laparotomy and left salpingo-oophorectomy.  SURGEON:  Michelle L. Helane Rima, M.D.  ANESTHESIA:  Spinal.  ESTIMATED BLOOD LOSS:  20 mL.  COMPLICATIONS:  None.  DRAINS:  Foley catheter.  PATHOLOGY:  Left tube and ovary.  DESCRIPTION OF PROCEDURE:  The patient was taken to the operating room. She was administered a spinal.  She was prepped and draped in usual sterile fashion.  A Foley catheter was inserted.  Time-out was performed.  The patient had been marked appropriately in the preop area. A mini-laparotomy was performed with a scalpel, was carried down to the fascia and the fascia scored in the midline, extended laterally.  The rectus muscles were divided in the midline, the peritoneum was entered bluntly.  We did not use a retractor because we did not need to.  I then inserted my hand and I could see that she had a small uterus and a very small normal right tube and ovary.  Her left ovary was enlarged and had a smooth wall cyst about 5-6 cm in diameter.  It appeared benign grossly.  We then were able to elevate this cyst with the ovary and exteriorize it.  There was no spillage or rupture of the cyst whatsoever.  I then could place 2 curved Haney clamps just beneath the tube and ovary across the infundibulopelvic ligament.  I then removed the specimen intact and secured the pedicles with a suture ligature of 0 Vicryl suture and a free tie of 0 Vicryl suture.  We then closed the peritoneum using 0 Vicryl.  The fascia was closed using 0 Vicryl starting at each  corner and meeting in the midline and the skin was closed with 3-0 Vicryl on a Keith needle.  Steri-Strips were applied. All sponge, lap, and instrument counts were correct x2.  The patient went to recovery room in stable condition.     Michelle L. Helane Rima, M.D.     [name] West  D:  07/20/2016  T:  07/20/2016  Job:  [id number]

## 2016-07-20 NOTE — OR Nursing (Signed)
Specimen timeout completed with Dr. Helane Rima and surgical team.

## 2016-07-20 NOTE — Progress Notes (Signed)
H and P on the chart Patient desires Left Salpingoophorectomy  Risks reviewed Consent signed

## 2016-07-20 NOTE — Progress Notes (Signed)
Assumed care of pt after receiving report from [place]. Pt resting comfortably. No complaints of nausea or vomiting or pain. Crackers given. Assessment completed. drsing satuated. See flow sheet for details.   2000: walked pt around unit. Pt tolerated it well.   2030: MD notified. Report status of pt given about the honeycomb drsessing. Order received to ok to change dressing. VSS. See flow sheet for details of Vs.

## 2016-07-21 ENCOUNTER — Encounter (HOSPITAL_COMMUNITY): Payer: Self-pay | Admitting: Obstetrics and Gynecology

## 2016-07-21 NOTE — Discharge Summary (Signed)
Admission Diagnosis: Left adnexal cyst  Discharge Diagnosis: Same  Hospital Course: 66 year old female with left adnexal cyst . Underwent Lap, LSO without incident. She did very well post op and by POD #1 she was ambulating, voiding and tolerating regular diet. She was discharged home in good condition and only on Tylenol or Aleve.  BP (!) 106/57 (BP Location: Right Arm)   Pulse 95   Temp 98.4 F (36.9 C) (Oral)   Resp 16   Ht 4' 11.5" (1.511 m)   Wt 42.6 kg (94 lb)   SpO2 95%   BMI 18.67 kg/m  Abdomen is soft and non tender Incision clean and dry  No results found for this or any previous visit (from the past 24 hour(s)).  Follow up in 1 week Discharge instructions.

## 2016-07-21 NOTE — Progress Notes (Signed)
Discharge teaching complete with pt. Pt understood all instructions and did not have any questions. Pt ambulated out of the hospital and discharged home to family.

## 2016-07-26 ENCOUNTER — Ambulatory Visit (INDEPENDENT_AMBULATORY_CARE_PROVIDER_SITE_OTHER): Payer: Medicare Other | Admitting: Thoracic Surgery (Cardiothoracic Vascular Surgery)

## 2016-07-26 ENCOUNTER — Encounter: Payer: Self-pay | Admitting: Thoracic Surgery (Cardiothoracic Vascular Surgery)

## 2016-07-26 VITALS — BP 103/66 | HR 84 | Resp 16 | Wt 91.0 lb

## 2016-07-26 DIAGNOSIS — Z902 Acquired absence of lung [part of]: Secondary | ICD-10-CM

## 2016-07-26 DIAGNOSIS — C3411 Malignant neoplasm of upper lobe, right bronchus or lung: Secondary | ICD-10-CM

## 2016-07-26 NOTE — Progress Notes (Signed)
[address]       [place] [address]             [phone]    HPI: [name] West returns today for a one year follow up after right upper lobectomy  [name] who presented with left-sided chest pain. A chest x-ray showed a right upper lobe mass. Bronchoscopy revealed an adenocarcinoma. She was stage IB clinically. I did a thoracoscopic right upper lobectomy and node dissection on 07/20/2015. She had multiple positive nodes and final stage was T2, N2, stage IIIA.  She underwent adjuvant chemotherapy and radiation therapy. She had 4 cycles of cisplatin and Alimta. She saw Dr. Julien Nordmann back in December and was doing well with no evidence recurrent disease at that time.  In the interim since her last visit she had an ovarian cyst removal. She feels well. She does not have any residual pain. She has not had any issues with her breathing. She does have a cough has been present ever since the surgery. It is nonproductive. Her appetite is good and weight is stable.  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 She has moved to a new floor. Past Medical History:  Diagnosis Date  . Anxiety   . Arthritis    shoulders  . Cancer ([place])    Lung Cancer  . Complication of anesthesia    nausea  . Cough 09/17/2015  . Encounter for antineoplastic chemotherapy 10/22/2015  . Environmental and seasonal allergies   . Family history of adverse reaction to anesthesia    Patients grandmother died while having hand surgery; pt unsure of cause  . Oral thrush 10/22/2015  . Ovarian cyst   . PONV (postoperative nausea and vomiting)   . Raynaud disease      Current Outpatient Prescriptions  Medication Sig Dispense Refill  . acetaminophen (TYLENOL) 500 MG tablet Take 2 tablets (1,000 mg total) by mouth every 6 (six) hours as needed. 30 tablet 0  . Azelaic Acid (FINACEA) 15 % cream Apply 1 application topically 2 (two) times daily. After skin is  thoroughly washed and patted dry, gently but thoroughly massage a thin film of azelaic acid cream into the affected area twice daily, in the morning and evening.    . Brimonidine Tartrate (MIRVASO) 0.33 % GEL Apply 1 application topically daily.    . cholecalciferol (VITAMIN D) 1000 UNITS tablet Take 1,000 Units by mouth daily.    . citalopram (CELEXA) 10 MG tablet Take 10 mg by mouth every morning.    Marland Kitchen EPINEPHrine 0.3 mg/0.3 mL IJ SOAJ injection Inject 0.3 mg into the muscle as needed (For allergic reaction).    . fexofenadine (ALLEGRA) 180 MG tablet Take 180 mg by mouth daily.    Marland Kitchen ibuprofen (ADVIL,MOTRIN) 200 MG tablet Take 200 mg by mouth every 6 (six) hours as needed for mild pain or moderate pain.    Marland Kitchen LORazepam (ATIVAN) 0.5 MG tablet Take 0.25 mg by mouth at bedtime as needed for sleep. Reported on 10/07/2015    . Multiple Vitamin (MULTIVITAMIN WITH MINERALS) TABS Take 1 tablet by mouth daily.    . phenylephrine (SUDAFED PE) 10 MG TABS tablet Take 10 mg by mouth daily as needed.    . polyethylene glycol (MIRALAX / GLYCOLAX) packet Take [name] g by mouth daily as needed for mild constipation.    Marland Kitchen zolpidem (AMBIEN) 5 MG tablet Take 5 mg by mouth at bedtime  as needed for sleep. Reported on 01/01/2016     No current facility-administered medications for this visit.     Physical Exam BP 103/66 (BP Location: Right Arm, Patient Position: Sitting, Cuff Size: Normal)   Pulse 84   Resp 16   Wt 91 lb (41.3 kg)   SpO2 99% Comment: ON RA  BMI 18.[name] kg/m  [name] West in no acute distress Alert and oriented 3 with no focal deficits Lungs diminished right base, otherwise clear Incisions well healed Cardiac regular rate and rhythm normal S1 and S2  Diagnostic Tests: I reviewed her CT from December 2017. It showed no evidence of recurrence. There was a small groundglass opacity in the right upper lung that was getting smaller.  Impression: 66 year old West who had surgery followed by  adjuvant chemoradiation for stage IIIA adenocarcinoma with a good response to treatment. She now is a year out from surgery and is doing well with no residual effects.  Dr. Julien Nordmann will see her back in March and has a CT scheduled for that time.  Plan: I will be happy to see her back any time if I can be of any further assistance with her care.  Melrose Nakayama, MD [facility] [phone]

## 2016-08-05 ENCOUNTER — Encounter: Payer: Self-pay | Admitting: Internal Medicine

## 2016-08-09 ENCOUNTER — Other Ambulatory Visit: Payer: Self-pay | Admitting: Medical Oncology

## 2016-08-09 ENCOUNTER — Encounter: Payer: Self-pay | Admitting: Medical Oncology

## 2016-08-09 DIAGNOSIS — Z91041 Radiographic dye allergy status: Secondary | ICD-10-CM

## 2016-08-09 MED ORDER — PREDNISONE 50 MG PO TABS: ORAL_TABLET | ORAL | 1 refills | Status: DC

## 2016-08-12 ENCOUNTER — Telehealth: Payer: Self-pay | Admitting: Emergency Medicine

## 2016-08-12 NOTE — Telephone Encounter (Signed)
Spoke with pt, who is requesting to speak with [name]. Pt states she attended Dr. Lamonte Sakai and Dr. Leonarda Salon  presentation last November, for pt's who are non smokers who developed lung cancer. Pt discussed her interest of being involved with this with Dr. Roxan Hockey, and pt was told to contact Eric Form for more info.  Sarah please advise. Thanks

## 2016-08-18 ENCOUNTER — Encounter: Payer: Self-pay | Admitting: Internal Medicine

## 2016-08-24 NOTE — Telephone Encounter (Signed)
Sarah please advise further. Thanks.

## 2016-08-25 NOTE — Telephone Encounter (Signed)
Please have [name] West  call me this coming Tuesday afternoon, between  3 and 5 . This is the only time I have dedicated to [place] when I can really have a meaningful conversation with her.. Please let the girls at the front desk know to transfer her call to me when she calls. I should be in my office at that time. Please tell her thank you. Thanks so  Much!!

## 2016-08-25 NOTE — Telephone Encounter (Signed)
Ms [name] West returned call and I let her know that [name] was only avail to talk on tues. Afternoon between 3-5.Hillery Hunter

## 2016-08-25 NOTE — Telephone Encounter (Signed)
[email or web address] x1 

## 2016-08-30 ENCOUNTER — Other Ambulatory Visit (HOSPITAL_BASED_OUTPATIENT_CLINIC_OR_DEPARTMENT_OTHER): Payer: Medicare Other

## 2016-08-30 ENCOUNTER — Ambulatory Visit (HOSPITAL_COMMUNITY)
Admission: RE | Admit: 2016-08-30 | Discharge: 2016-08-30 | Disposition: A | Discharge status: Home / Self Care | Payer: Medicare Other | Source: Ambulatory Visit | Attending: Internal Medicine | Admitting: Internal Medicine

## 2016-08-30 DIAGNOSIS — Z902 Acquired absence of lung [part of]: Secondary | ICD-10-CM | POA: Insufficient documentation

## 2016-08-30 DIAGNOSIS — C3491 Malignant neoplasm of unspecified part of right bronchus or lung: Secondary | ICD-10-CM | POA: Diagnosis not present

## 2016-08-30 DIAGNOSIS — R918 Other nonspecific abnormal finding of lung field: Secondary | ICD-10-CM | POA: Diagnosis not present

## 2016-08-30 DIAGNOSIS — C3411 Malignant neoplasm of upper lobe, right bronchus or lung: Secondary | ICD-10-CM | POA: Diagnosis present

## 2016-08-30 DIAGNOSIS — R05 Cough: Secondary | ICD-10-CM | POA: Diagnosis not present

## 2016-08-30 DIAGNOSIS — R059 Cough, unspecified: Secondary | ICD-10-CM

## 2016-08-30 LAB — COMPREHENSIVE METABOLIC PANEL
ALT: 24 U/L (ref 0–55)
AST: 25 U/L (ref 5–34)
Albumin: 4.5 g/dL (ref 3.5–5.0)
Alkaline Phosphatase: 71 U/L (ref 40–150)
Anion Gap: 11 mEq/L (ref 3–11)
BUN: 23.5 mg/dL (ref 7.0–26.0)
CO2: 27 mEq/L (ref 22–29)
Calcium: 10.3 mg/dL (ref 8.4–10.4)
Chloride: 102 mEq/L (ref 98–109)
Creatinine: 0.8 mg/dL (ref 0.6–1.1)
EGFR: 72 mL/min/{1.73_m2} — ABNORMAL LOW (ref >90)
Glucose: 110 mg/dl (ref 70–140)
Potassium: 4.3 mEq/L (ref 3.5–5.1)
Sodium: 140 mEq/L (ref 136–145)
Total Bilirubin: 0.49 mg/dL (ref 0.20–1.20)
Total Protein: 7.6 g/dL (ref 6.4–8.3)

## 2016-08-30 LAB — CBC WITH DIFFERENTIAL/PLATELET
BASO%: 0.3 % (ref 0.0–2.0)
Basophils Absolute: 0 10*3/uL (ref 0.0–0.1)
EOS%: 0 % (ref 0.0–7.0)
Eosinophils Absolute: 0 10*3/uL (ref 0.0–0.5)
HCT: 46 % (ref 34.8–46.6)
HGB: 15.8 g/dL (ref 11.6–15.9)
LYMPH%: 7.9 % — ABNORMAL LOW (ref 14.0–49.7)
MCH: 31.1 pg (ref 25.1–34.0)
MCHC: 34.3 g/dL (ref 31.5–36.0)
MCV: 90.8 fL (ref 79.5–101.0)
MONO#: 0.3 10*3/uL (ref 0.1–0.9)
MONO%: 2.9 % (ref 0.0–14.0)
NEUT#: 8.6 10*3/uL — ABNORMAL HIGH (ref 1.5–6.5)
NEUT%: 88.9 % — ABNORMAL HIGH (ref 38.4–76.8)
Platelets: 248 10*3/uL (ref 145–400)
RBC: 5.07 10*6/uL (ref 3.70–5.45)
RDW: 13.1 % (ref 11.2–14.5)
WBC: 9.7 10*3/uL (ref 3.9–10.3)
lymph#: 0.8 10*3/uL — ABNORMAL LOW (ref 0.9–3.3)

## 2016-08-30 MED ORDER — IOPAMIDOL (ISOVUE-300) INJECTION 61%
INTRAVENOUS | Status: AC
  Administered 2016-08-30: 75 mL
  Filled 2016-08-30: qty 75

## 2016-09-01 ENCOUNTER — Ambulatory Visit (HOSPITAL_BASED_OUTPATIENT_CLINIC_OR_DEPARTMENT_OTHER): Payer: Medicare Other | Admitting: Internal Medicine

## 2016-09-01 ENCOUNTER — Encounter: Payer: Self-pay | Admitting: Internal Medicine

## 2016-09-01 ENCOUNTER — Telehealth: Payer: Self-pay | Admitting: Internal Medicine

## 2016-09-01 VITALS — BP 119/76 | HR 76 | Temp 97.6°F | Resp 18 | Ht 59.5 in | Wt 93.0 lb

## 2016-09-01 DIAGNOSIS — C3411 Malignant neoplasm of upper lobe, right bronchus or lung: Secondary | ICD-10-CM

## 2016-09-01 DIAGNOSIS — C3491 Malignant neoplasm of unspecified part of right bronchus or lung: Secondary | ICD-10-CM

## 2016-09-01 NOTE — Telephone Encounter (Signed)
Gave patient avs report and appointments for July. [facility] will call re scan.  °

## 2016-09-01 NOTE — Progress Notes (Signed)
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  PRIOR THERAPY: 1) Right VATS with right upper lobectomy with bronchoplasty closure and en bloc resection of a wedge from the superior segment of the right lower lobe in addition to mediastinal lymph node dissection on 07/30/2015 under the care of Dr. Roxan Hockey. 2) Adjuvant systemic chemotherapy with cisplatin 75 MG/M2 and Alimta 500 MG/M2 every 3 weeks. First dose was given on 09/10/2015. Status post 4 cycles. 3) adjuvant radiotherapy under the care of Dr. Tammi Klippel.  CURRENT THERAPY: Observation.  INTERVAL HISTORY: [name] West 66 y.o. female returns to the clinic today for follow-up visit accompanied by her husband. The patient is feeling fine today with no specific complaints. She works at [employer] and she mentioned that the building she has been working in has asbestos. She moved her office to a different area. She she has no chest pain, shortness of breath, cough or hemoptysis. She has no fever or chills. She denied having any nausea, vomiting, diarrhea or constipation. She denied having any significant weight loss or night sweats. The patient had repeat CT scan of the chest performed recently and she is here for evaluation and discussion of her scan results.  MEDICAL HISTORY: Past Medical History:  Diagnosis Date  . Anxiety   . Arthritis    shoulders  . Cancer ([place])    Lung Cancer  . Complication of anesthesia    nausea  . Cough 09/17/2015  . Encounter for antineoplastic chemotherapy 10/22/2015  . Environmental and seasonal allergies   . Family history of adverse reaction to  anesthesia    Patients grandmother died while having hand surgery; pt unsure of cause  . Oral thrush 10/22/2015  . Ovarian cyst   . PONV (postoperative nausea and vomiting)   . Raynaud disease     ALLERGIES:  is allergic to avelox [moxifloxacin hcl in nacl]; cephalosporins; clindamycin/lincomycin; penicillins; macrobid [nitrofurantoin monohyd macro]; omnipaque [iohexol]; ciprofloxacin; adhesive [tape]; doxycycline; lidoderm [lidocaine]; pseudoephedrine; and sulfa antibiotics.  MEDICATIONS:  Current Outpatient Prescriptions  Medication Sig Dispense Refill  . Azelaic Acid (FINACEA) 15 % cream Apply 1 application topically 2 (two) times daily. After skin is thoroughly washed and patted dry, gently but thoroughly massage a thin film of azelaic acid cream into the affected area twice daily, in the morning and evening.    . Brimonidine Tartrate (MIRVASO) 0.33 % GEL Apply 1 application topically daily.    . cholecalciferol (VITAMIN D) 1000 UNITS tablet Take 1,000 Units by mouth daily.    . citalopram (CELEXA) 10 MG tablet Take 10 mg by mouth every morning.    . fexofenadine (ALLEGRA) 180 MG tablet Take 180 mg by mouth daily.    Marland Kitchen ibuprofen (ADVIL,MOTRIN) 200 MG tablet Take 200 mg by mouth every 6 (six) hours as needed for mild pain or moderate pain.    Marland Kitchen LORazepam (ATIVAN) 0.5 MG tablet Take 0.25 mg by mouth at bedtime as needed for sleep. Reported on 10/07/2015    . Multiple Vitamin (MULTIVITAMIN WITH MINERALS) TABS Take 1 tablet by mouth daily.    . phenylephrine (SUDAFED PE) 10 MG TABS tablet Take 10 mg by mouth daily as  needed.    . polyethylene glycol (MIRALAX / GLYCOLAX) packet Take 17 g by mouth daily as needed for mild constipation.    Marland Kitchen acetaminophen (TYLENOL) 500 MG tablet Take 2 tablets (1,000 mg total) by mouth every 6 (six) hours as needed. (Patient not taking: Reported on 09/01/2016) 30 tablet 0  . EPINEPHrine 0.3 mg/0.3 mL IJ SOAJ injection Inject 0.3 mg into the muscle as needed (For  allergic reaction).    . predniSONE (DELTASONE) 50 MG tablet Take one tablet ( 50 mg) 13 hours and 2 hours prior to scan. (Patient not taking: Reported on 09/01/2016) 2 tablet 1  . zolpidem (AMBIEN) 5 MG tablet Take 5 mg by mouth at bedtime as needed for sleep. Reported on 01/01/2016     No current facility-administered medications for this visit.     SURGICAL HISTORY:  Past Surgical History:  Procedure Laterality Date  . CESAREAN SECTION  1984  . COLONOSCOPY WITH PROPOFOL N/A 09/27/2012   Procedure: COLONOSCOPY WITH PROPOFOL;  Surgeon: Garlan Fair, MD;  Location: [facility];  Service: Endoscopy;  Laterality: N/A;  . Fibroid Removed    . LAPAROTOMY Left 07/20/2016   Procedure: MINI LAPAROTOMY, REMOVAL OF LEFT OVARIAN CYST, LEFT SALPINGO-OOPHORECTOMY;  Surgeon: Dian Queen, MD;  Location: [facility];  Service: Gynecology;  Laterality: Left;  please moved to 7:30am if spot opens  . Right Rotator Cuff Right   . VIDEO ASSISTED THORACOSCOPY (VATS)/ LOBECTOMY Right 07/30/2015   Procedure: RIGHT VIDEO ASSISTED THORACOSCOPY (VATS)/RIGHT UPPER LOBECTOMY;  Surgeon: Melrose Nakayama, MD;  Location: [facility];  Service: Thoracic;  Laterality: Right;  Marland Kitchen VIDEO BRONCHOSCOPY WITH ENDOBRONCHIAL NAVIGATION N/A 07/15/2015   Procedure: VIDEO BRONCHOSCOPY WITH ENDOBRONCHIAL NAVIGATION with Biopsy;  Surgeon: Collene Gobble, MD;  Location: [facility];  Service: Thoracic;  Laterality: N/A;    REVIEW OF SYSTEMS:  A comprehensive review of systems was negative.   PHYSICAL EXAMINATION: General appearance: alert, cooperative and no distress Head: Normocephalic, without obvious abnormality, atraumatic Neck: no adenopathy, no JVD, supple, symmetrical, trachea midline and thyroid not enlarged, symmetric, no tenderness/mass/nodules Lymph nodes: Cervical, supraclavicular, and axillary nodes normal. Resp: clear to auscultation bilaterally Back: symmetric, no curvature. ROM normal. No CVA tenderness. Cardio: regular rate and  rhythm, S1, S2 normal, no murmur, click, rub or gallop GI: soft, non-tender; bowel sounds normal; no masses,  no organomegaly Extremities: extremities normal, atraumatic, no cyanosis or edema  ECOG PERFORMANCE STATUS: 1 - Symptomatic but completely ambulatory  Blood pressure 119/76, pulse 76, temperature 97.6 F (36.4 C), temperature source Oral, resp. rate 18, height 4' 11.5" (1.511 m), weight 93 lb (42.2 kg), SpO2 99 %.  LABORATORY DATA: Lab Results  Component Value Date   WBC 9.7 08/30/2016   HGB 15.8 08/30/2016   HCT 46.0 08/30/2016   MCV 90.8 08/30/2016   PLT 248 08/30/2016      Chemistry      Component Value Date/Time   NA 140 08/30/2016 1016   K 4.3 08/30/2016 1016   CL 97 (L) 08/14/2015 2201   CO2 27 08/30/2016 1016   BUN 23.5 08/30/2016 1016   CREATININE 0.8 08/30/2016 1016      Component Value Date/Time   CALCIUM 10.3 08/30/2016 1016   ALKPHOS 71 08/30/2016 1016   AST 25 08/30/2016 1016   ALT 24 08/30/2016 1016   BILITOT 0.49 08/30/2016 1016       RADIOGRAPHIC STUDIES: Ct Chest W Contrast  Result Date: 08/30/2016 CLINICAL DATA:  Right lung cancer, diagnosed  07/2015, status post right upper lobectomy, chemotherapy/ XRT complete EXAM: CT CHEST WITH CONTRAST TECHNIQUE: Multidetector CT imaging of the chest was performed during intravenous contrast administration. CONTRAST:  43m ISOVUE-300 IOPAMIDOL (ISOVUE-300) INJECTION 61% COMPARISON:  05/30/2016 and 11/25/2015 FINDINGS: Cardiovascular: The heart is normal in size. No pericardial effusion. No evidence of thoracic aortic aneurysm. Mediastinum/Nodes: No suspicious mediastinal lymphadenopathy. Visualized thyroid is unremarkable. Lungs/Pleura: Status post right upper lobectomy. Stable 4-5 mm nodules in the posterior right lower lobe (series 5/ images 62 and 74). Stable 3 mm left lower lobe nodule (series 5/ image 98). No focal consolidation. Mild right apical pleural-parenchymal scarring. No pleural effusion or  pneumothorax. Upper Abdomen: Visualized upper abdomen is unremarkable. Musculoskeletal: Visualized osseous structures are within normal limits. IMPRESSION: Status post right upper lobectomy. Stable bilateral pulmonary nodules measuring up to 5 mm, unchanged since at least 11/25/2015, likely benign. No findings suspicious for recurrent or metastatic disease. Electronically Signed   By: SJulian HyM.D.   On: 08/30/2016 14:00    ASSESSMENT AND PLAN:  This is a very pleasant 66years old white female with a stage IIIa non-small cell lung cancer, adenocarcinoma with positive EGFR mutation in exon 19, status post right upper lobectomy with lymph node dissection followed by adjuvant systemic chemotherapy as well as adjuvant radiation. She is currently on observation and the patient is feeling fine. She had repeat CT scan of the chest that showed no evidence for disease recurrence. I discussed the scan results with the patient and her husband. I recommended for her to continue on observation with repeat CT scan of the chest in 4 months. She was advised to call immediately if she has any concerning symptoms in the interval. She was advised to call immediately if she has any concerning symptoms in the interval. The patient voices understanding of current disease status and treatment options and is in agreement with the current care plan. All questions were answered. The patient knows to call the clinic with any problems, questions or concerns. We can certainly see the patient much sooner if necessary. I spent 10 minutes counseling the patient face to face. The total time spent in the appointment was 15 minutes.   Disclaimer: This note was dictated with voice recognition software. Similar sounding words can inadvertently be transcribed and may not be corrected upon review.

## 2016-09-02 NOTE — Telephone Encounter (Signed)
Sarah please advise if this message can be closed. Thanks!

## 2016-09-05 NOTE — Telephone Encounter (Signed)
Spoke with pt.  She is asking about possible volunteer opportunities to help get the word out for the lung screening program.  She states that she also works at [employer] and could get students to help with this also.  I advised her that I would talk to Eric Form, NP regarding this and will be back in touch with her.  Pt verbalized understanding.

## 2016-09-12 DIAGNOSIS — H0011 Chalazion right upper eyelid: Secondary | ICD-10-CM | POA: Diagnosis not present

## 2016-09-15 NOTE — Telephone Encounter (Signed)
I have spoken with Ms. Galasso. We greatly appreciate her willingness to assist with community awareness of the program. We made plans to call her when we are preparing to market program to the public. She is agreeable with this plan. Nothing further is needed. Langley Gauss, please keep Ms. Seubert contact information  so we can call her when we are ready for community engagement.Thanks so much.

## 2016-09-27 DIAGNOSIS — Z86018 Personal history of other benign neoplasm: Secondary | ICD-10-CM | POA: Diagnosis not present

## 2016-09-27 DIAGNOSIS — L814 Other melanin hyperpigmentation: Secondary | ICD-10-CM | POA: Diagnosis not present

## 2016-09-27 DIAGNOSIS — D225 Melanocytic nevi of trunk: Secondary | ICD-10-CM | POA: Diagnosis not present

## 2016-09-27 DIAGNOSIS — L821 Other seborrheic keratosis: Secondary | ICD-10-CM | POA: Diagnosis not present

## 2016-09-27 DIAGNOSIS — D1801 Hemangioma of skin and subcutaneous tissue: Secondary | ICD-10-CM | POA: Diagnosis not present

## 2016-10-17 DIAGNOSIS — H5213 Myopia, bilateral: Secondary | ICD-10-CM | POA: Diagnosis not present

## 2016-10-17 DIAGNOSIS — H43811 Vitreous degeneration, right eye: Secondary | ICD-10-CM | POA: Diagnosis not present

## 2016-10-17 DIAGNOSIS — H2513 Age-related nuclear cataract, bilateral: Secondary | ICD-10-CM | POA: Diagnosis not present

## 2016-10-26 ENCOUNTER — Other Ambulatory Visit: Payer: Self-pay | Admitting: Family Medicine

## 2016-10-26 DIAGNOSIS — Z1231 Encounter for screening mammogram for malignant neoplasm of breast: Secondary | ICD-10-CM

## 2016-11-17 ENCOUNTER — Encounter: Payer: Self-pay | Admitting: Internal Medicine

## 2016-11-22 ENCOUNTER — Encounter (HOSPITAL_COMMUNITY): Payer: Self-pay | Admitting: Adult Health

## 2016-11-23 ENCOUNTER — Telehealth: Payer: Self-pay | Admitting: *Deleted

## 2016-11-23 NOTE — Telephone Encounter (Signed)
Oncology Nurse Navigator Documentation  Oncology Nurse Navigator Flowsheets 11/23/2016  Navigator Location [facility]  Navigator Encounter Type Telephone/I received a message from NP at [facility].  She requested I call patient and updated her on why scan is not authorized. I called and spoke with patient. Ms. Kriegel was thankful for the call and update.   Telephone Outgoing Call  Treatment Phase Post-Tx Follow-up  Barriers/Navigation Needs Education  Education Other  Interventions Education  Acuity Level 2  Time Spent with Patient 30

## 2016-11-24 DIAGNOSIS — H01004 Unspecified blepharitis left upper eyelid: Secondary | ICD-10-CM | POA: Diagnosis not present

## 2016-11-24 DIAGNOSIS — H01001 Unspecified blepharitis right upper eyelid: Secondary | ICD-10-CM | POA: Diagnosis not present

## 2016-12-22 NOTE — Anesthesia Postprocedure Evaluation (Signed)
Anesthesia Post Note  Patient: [name] West  Procedure(s) Performed: Procedure(s) (LRB): MINI LAPAROTOMY, REMOVAL OF LEFT OVARIAN CYST, LEFT SALPINGO-OOPHORECTOMY (Left)     Anesthesia Post Evaluation  Last Vitals:  Vitals:   07/20/16 2331 07/21/16 0800  BP:  (!) 110/55  Pulse: 95 98  Resp: 16 18  Temp: 36.9 C 36.7 C    Last Pain:  Vitals:   07/21/16 0830  TempSrc:   PainSc: 3                  FOSTER,MICHAEL A.

## 2016-12-22 NOTE — Addendum Note (Signed)
Addendum  created 12/22/16 1542 by Josephine Igo, MD   Sign clinical note

## 2016-12-28 ENCOUNTER — Ambulatory Visit
Admission: RE | Admit: 2016-12-28 | Discharge: 2016-12-28 | Disposition: A | Discharge status: Home / Self Care | Payer: Medicare Other | Source: Ambulatory Visit | Attending: Family Medicine | Admitting: Family Medicine

## 2016-12-28 DIAGNOSIS — Z1231 Encounter for screening mammogram for malignant neoplasm of breast: Secondary | ICD-10-CM

## 2016-12-30 ENCOUNTER — Other Ambulatory Visit (HOSPITAL_BASED_OUTPATIENT_CLINIC_OR_DEPARTMENT_OTHER): Payer: Medicare Other

## 2016-12-30 ENCOUNTER — Ambulatory Visit (HOSPITAL_COMMUNITY)
Admission: RE | Admit: 2016-12-30 | Discharge: 2016-12-30 | Disposition: A | Discharge status: Home / Self Care | Payer: Medicare Other | Source: Ambulatory Visit | Attending: Internal Medicine | Admitting: Internal Medicine

## 2016-12-30 DIAGNOSIS — Z902 Acquired absence of lung [part of]: Secondary | ICD-10-CM | POA: Insufficient documentation

## 2016-12-30 DIAGNOSIS — C3411 Malignant neoplasm of upper lobe, right bronchus or lung: Secondary | ICD-10-CM

## 2016-12-30 DIAGNOSIS — I2510 Atherosclerotic heart disease of native coronary artery without angina pectoris: Secondary | ICD-10-CM | POA: Diagnosis not present

## 2016-12-30 DIAGNOSIS — C3491 Malignant neoplasm of unspecified part of right bronchus or lung: Secondary | ICD-10-CM | POA: Insufficient documentation

## 2016-12-30 DIAGNOSIS — R918 Other nonspecific abnormal finding of lung field: Secondary | ICD-10-CM | POA: Diagnosis not present

## 2016-12-30 LAB — CBC WITH DIFFERENTIAL/PLATELET
BASO%: 0.2 % (ref 0.0–2.0)
Basophils Absolute: 0 10*3/uL (ref 0.0–0.1)
EOS%: 0 % (ref 0.0–7.0)
Eosinophils Absolute: 0 10*3/uL (ref 0.0–0.5)
HCT: 45.5 % (ref 34.8–46.6)
HGB: 15.6 g/dL (ref 11.6–15.9)
LYMPH%: 9.2 % — ABNORMAL LOW (ref 14.0–49.7)
MCH: 31.5 pg (ref 25.1–34.0)
MCHC: 34.4 g/dL (ref 31.5–36.0)
MCV: 91.6 fL (ref 79.5–101.0)
MONO#: 0.4 10*3/uL (ref 0.1–0.9)
MONO%: 5 % (ref 0.0–14.0)
NEUT#: 7.5 10*3/uL — ABNORMAL HIGH (ref 1.5–6.5)
NEUT%: 85.6 % — ABNORMAL HIGH (ref 38.4–76.8)
Platelets: 238 10*3/uL (ref 145–400)
RBC: 4.97 10*6/uL (ref 3.70–5.45)
RDW: 12.8 % (ref 11.2–14.5)
WBC: 8.7 10*3/uL (ref 3.9–10.3)
lymph#: 0.8 10*3/uL — ABNORMAL LOW (ref 0.9–3.3)

## 2016-12-30 LAB — COMPREHENSIVE METABOLIC PANEL
ALT: 17 U/L (ref 0–55)
AST: 20 U/L (ref 5–34)
Albumin: 4.5 g/dL (ref 3.5–5.0)
Alkaline Phosphatase: 60 U/L (ref 40–150)
Anion Gap: 10 mEq/L (ref 3–11)
BUN: 21.1 mg/dL (ref 7.0–26.0)
CO2: 27 mEq/L (ref 22–29)
Calcium: 10.4 mg/dL (ref 8.4–10.4)
Chloride: 103 mEq/L (ref 98–109)
Creatinine: 0.9 mg/dL (ref 0.6–1.1)
EGFR: 69 mL/min/{1.73_m2} — ABNORMAL LOW (ref >90)
Glucose: 109 mg/dl (ref 70–140)
Potassium: 4.9 mEq/L (ref 3.5–5.1)
Sodium: 140 mEq/L (ref 136–145)
Total Bilirubin: 0.59 mg/dL (ref 0.20–1.20)
Total Protein: 7.3 g/dL (ref 6.4–8.3)

## 2016-12-30 MED ORDER — IOPAMIDOL (ISOVUE-300) INJECTION 61%
INTRAVENOUS | Status: AC
  Filled 2016-12-30: qty 75

## 2016-12-30 MED ORDER — IOPAMIDOL (ISOVUE-300) INJECTION 61%
75.0000 mL | Freq: Once | INTRAVENOUS | Status: AC | PRN
  Administered 2016-12-30: 75 mL via INTRAVENOUS

## 2017-01-03 ENCOUNTER — Encounter: Payer: Self-pay | Admitting: Internal Medicine

## 2017-01-03 ENCOUNTER — Telehealth: Payer: Self-pay | Admitting: Internal Medicine

## 2017-01-03 ENCOUNTER — Ambulatory Visit (HOSPITAL_BASED_OUTPATIENT_CLINIC_OR_DEPARTMENT_OTHER): Payer: Medicare Other | Admitting: Internal Medicine

## 2017-01-03 VITALS — BP 117/64 | HR 83 | Temp 97.9°F | Resp 20 | Ht 59.5 in | Wt 93.0 lb

## 2017-01-03 DIAGNOSIS — C3411 Malignant neoplasm of upper lobe, right bronchus or lung: Secondary | ICD-10-CM | POA: Diagnosis not present

## 2017-01-03 DIAGNOSIS — C3491 Malignant neoplasm of unspecified part of right bronchus or lung: Secondary | ICD-10-CM

## 2017-01-03 NOTE — Telephone Encounter (Signed)
Scheduled appt per 7/24 los - Gave patient AVS and calender per los. [facility] to contact patient with ct schedule.  

## 2017-01-03 NOTE — Progress Notes (Signed)
[facility] [phone]   [phone]  OFFICE PROGRESS NOTE  [name] West, [place] [address]  DIAGNOSIS: Stage IIIA (T2a, N2, M0) non-small cell lung cancer, adenocarcinoma with positive EGFR mutation with deletion in exon 19, presented with right upper lobe lung mass and mediastinal lymphadenopathy, status post surgical resection February 2017.  PRIOR THERAPY: 1) Right VATS with right upper lobectomy with bronchoplasty closure and en bloc resection of a wedge from the superior segment of the right lower lobe in addition to mediastinal lymph node dissection on 07/30/2015 under the care of Dr. Roxan Hockey. 2) Adjuvant systemic chemotherapy with cisplatin 75 MG/M2 and Alimta 500 MG/M2 every 3 weeks. First dose was given on 09/10/2015. Status post 4 cycles. 3) adjuvant radiotherapy under the care of Dr. Tammi Klippel.  CURRENT THERAPY: Observation.  INTERVAL HISTORY: [name] West 66 y.o. female returns to the clinic today for follow-up visit accompanied by her husband. The patient is feeling fine today with no specific complaints. She denied having any chest pain, shortness of breath, cough or hemoptysis. She denied having any fever or chills. She has no nausea, vomiting, diarrhea or constipation. She denied having any weight loss or night sweats. The patient had repeat CT scan of the chest performed recently and she is here for evaluation and discussion of her scan results.  MEDICAL HISTORY: Past Medical History:  Diagnosis Date  . Anxiety   . Arthritis    shoulders  . Cancer ([place])    Lung Cancer  . Complication of anesthesia    nausea  . Cough 09/17/2015  . Encounter for antineoplastic chemotherapy 10/22/2015  . Environmental and seasonal allergies   . Family history of adverse reaction to anesthesia    Patients grandmother died while having hand surgery; pt unsure of cause  . Oral thrush 10/22/2015  . Ovarian cyst   .  PONV (postoperative nausea and vomiting)   . Raynaud disease     ALLERGIES:  is allergic to avelox [moxifloxacin hcl in nacl]; cephalosporins; clindamycin/lincomycin; penicillins; macrobid [nitrofurantoin monohyd macro]; omnipaque [iohexol]; ciprofloxacin; adhesive [tape]; doxycycline; lidoderm [lidocaine]; pseudoephedrine; and sulfa antibiotics.  MEDICATIONS:  Current Outpatient Prescriptions  Medication Sig Dispense Refill  . acetaminophen (TYLENOL) 500 MG tablet Take 2 tablets (1,000 mg total) by mouth every 6 (six) hours as needed. (Patient not taking: Reported on 09/01/2016) 30 tablet 0  . Azelaic Acid (FINACEA) 15 % cream Apply 1 application topically 2 (two) times daily. After skin is thoroughly washed and patted dry, gently but thoroughly massage a thin film of azelaic acid cream into the affected area twice daily, in the morning and evening.    . Brimonidine Tartrate (MIRVASO) 0.33 % GEL Apply 1 application topically daily.    . cholecalciferol (VITAMIN D) 1000 UNITS tablet Take 1,000 Units by mouth daily.    . citalopram (CELEXA) 10 MG tablet Take 10 mg by mouth every morning.    Marland Kitchen EPINEPHrine 0.3 mg/0.3 mL IJ SOAJ injection Inject 0.3 mg into the muscle as needed (For allergic reaction).    . fexofenadine (ALLEGRA) 180 MG tablet Take 180 mg by mouth daily.    Marland Kitchen ibuprofen (ADVIL,MOTRIN) 200 MG tablet Take 200 mg by mouth every 6 (six) hours as needed for mild pain or moderate pain.    Marland Kitchen LORazepam (ATIVAN) 0.5 MG tablet Take 0.25 mg by mouth at bedtime as needed for sleep. Reported on 10/07/2015    . Multiple Vitamin (MULTIVITAMIN  WITH MINERALS) TABS Take 1 tablet by mouth daily.    . phenylephrine (SUDAFED PE) 10 MG TABS tablet Take 10 mg by mouth daily as needed.    . polyethylene glycol (MIRALAX / GLYCOLAX) packet Take 17 g by mouth daily as needed for mild constipation.    . predniSONE (DELTASONE) 50 MG tablet Take one tablet ( 50 mg) 13 hours and 2 hours prior to scan. (Patient not  taking: Reported on 09/01/2016) 2 tablet 1  . zolpidem (AMBIEN) 5 MG tablet Take 5 mg by mouth at bedtime as needed for sleep. Reported on 01/01/2016     No current facility-administered medications for this visit.     SURGICAL HISTORY:  Past Surgical History:  Procedure Laterality Date  . CESAREAN SECTION  1984  . COLONOSCOPY WITH PROPOFOL N/A 09/27/2012   Procedure: COLONOSCOPY WITH PROPOFOL;  Surgeon: Garlan Fair, MD;  Location: [facility];  Service: Endoscopy;  Laterality: N/A;  . Fibroid Removed    . LAPAROTOMY Left 07/20/2016   Procedure: MINI LAPAROTOMY, REMOVAL OF LEFT OVARIAN CYST, LEFT SALPINGO-OOPHORECTOMY;  Surgeon: Dian Queen, MD;  Location: [facility];  Service: Gynecology;  Laterality: Left;  please moved to 7:30am if spot opens  . Right Rotator Cuff Right   . VIDEO ASSISTED THORACOSCOPY (VATS)/ LOBECTOMY Right 07/30/2015   Procedure: RIGHT VIDEO ASSISTED THORACOSCOPY (VATS)/RIGHT UPPER LOBECTOMY;  Surgeon: Melrose Nakayama, MD;  Location: [facility];  Service: Thoracic;  Laterality: Right;  Marland Kitchen VIDEO BRONCHOSCOPY WITH ENDOBRONCHIAL NAVIGATION N/A 07/15/2015   Procedure: VIDEO BRONCHOSCOPY WITH ENDOBRONCHIAL NAVIGATION with Biopsy;  Surgeon: Collene Gobble, MD;  Location: [facility];  Service: Thoracic;  Laterality: N/A;    REVIEW OF SYSTEMS:  A comprehensive review of systems was negative.   PHYSICAL EXAMINATION: General appearance: alert, cooperative and no distress Head: Normocephalic, without obvious abnormality, atraumatic Neck: no adenopathy, no JVD, supple, symmetrical, trachea midline and thyroid not enlarged, symmetric, no tenderness/mass/nodules Lymph nodes: Cervical, supraclavicular, and axillary nodes normal. Resp: clear to auscultation bilaterally Back: symmetric, no curvature. ROM normal. No CVA tenderness. Cardio: regular rate and rhythm, S1, S2 normal, no murmur, click, rub or gallop GI: soft, non-tender; bowel sounds normal; no masses,  no  organomegaly Extremities: extremities normal, atraumatic, no cyanosis or edema  ECOG PERFORMANCE STATUS: 1 - Symptomatic but completely ambulatory  Blood pressure 117/64, pulse 83, temperature 97.9 F (36.6 C), temperature source Oral, resp. rate 20, height 4' 11.5" (1.511 m), weight 93 lb (42.2 kg), SpO2 98 %.  LABORATORY DATA: Lab Results  Component Value Date   WBC 8.7 12/30/2016   HGB 15.6 12/30/2016   HCT 45.5 12/30/2016   MCV 91.6 12/30/2016   PLT 238 12/30/2016      Chemistry      Component Value Date/Time   NA 140 12/30/2016 1002   K 4.9 12/30/2016 1002   CL 97 (L) 08/14/2015 2201   CO2 27 12/30/2016 1002   BUN 21.1 12/30/2016 1002   CREATININE 0.9 12/30/2016 1002      Component Value Date/Time   CALCIUM 10.4 12/30/2016 1002   ALKPHOS 60 12/30/2016 1002   AST 20 12/30/2016 1002   ALT 17 12/30/2016 1002   BILITOT 0.59 12/30/2016 1002       RADIOGRAPHIC STUDIES: Ct Chest W Contrast  Result Date: 12/30/2016 CLINICAL DATA:  Stage IIIA right upper lobe lung adenocarcinoma status post right upper lobectomy 07/30/2015, adjuvant chemotherapy and radiation therapy. Restaging. No acute symptoms reported. EXAM: CT CHEST WITH CONTRAST TECHNIQUE:  Multidetector CT imaging of the chest was performed during intravenous contrast administration. CONTRAST:  5m ISOVUE-300 IOPAMIDOL (ISOVUE-300) INJECTION 61% COMPARISON:  08/30/2016 chest CT. FINDINGS: Cardiovascular: Normal heart size. Stable small pericardial effusion/ thickening. Left anterior descending coronary atherosclerosis. Great vessels are normal in course and caliber. No central pulmonary emboli. Mediastinum/Nodes: No discrete thyroid nodules. Unremarkable esophagus. No pathologically enlarged axillary, mediastinal or hilar lymph nodes. Lungs/Pleura: No pneumothorax. No pleural effusion. Status post right upper lobectomy. A few scattered solid pulmonary nodules in both lungs, largest 5 mm in the the posterior right lower  lobe (series 7/ image 79), all stable since 11/25/2015. No acute consolidative airspace disease or new significant pulmonary nodules. Upper abdomen: Unremarkable. Musculoskeletal: No aggressive appearing focal osseous lesions. Minimal thoracic spondylosis. IMPRESSION: 1. No evidence of local tumor recurrence in the right lung status post right upper lobectomy. 2. No findings suspicious for metastatic disease in the chest. Scattered small pulmonary nodules are all stable for 13 months and considered benign. 3. One vessel coronary atherosclerosis. Electronically Signed   By: JIlona SorrelM.D.   On: 12/30/2016 14:28   Mm Screening Breast Tomo Bilateral  Result Date: 12/29/2016 CLINICAL DATA:  Screening. EXAM: 2D DIGITAL SCREENING BILATERAL MAMMOGRAM WITH CAD AND ADJUNCT TOMO COMPARISON:  Previous exam(s). ACR Breast Density Category d: The breast tissue is extremely dense, which lowers the sensitivity of mammography. FINDINGS: There are no findings suspicious for malignancy. Images were processed with CAD. IMPRESSION: No mammographic evidence of malignancy. A result letter of this screening mammogram will be mailed directly to the patient. RECOMMENDATION: Screening mammogram in one year. (Code:[id number]-B-01Y) BI-RADS CATEGORY  1: Negative. Electronically Signed   By: MAmmie FerrierM.D.   On: 12/29/2016 10:43    ASSESSMENT AND PLAN:  This is a very pleasant 66years old white female with stage IIIa non-small cell lung cancer, adenocarcinoma with positive EGFR mutation in exon 19 status post right upper lobectomy with lymph node dissection followed by adjuvant systemic chemotherapy as well as adjuvant radiation. The patient is currently on observation and she is feeling fine. She had repeat CT scan of the chest that showed no clear evidence for disease recurrence. I discussed the scan results with the patient and her husband today. I recommended for her to continue in observation with repeat CT scan of the chest  in 6 months. She was advised to call immediately if she has any concerning symptoms in the interval. The patient voices understanding of current disease status and treatment options and is in agreement with the current care plan. All questions were answered. The patient knows to call the clinic with any problems, questions or concerns. We can certainly see the patient much sooner if necessary. I spent 10 minutes counseling the patient face to face. The total time spent in the appointment was 15 minutes.   Disclaimer: This note was dictated with voice recognition software. Similar sounding words can inadvertently be transcribed and may not be corrected upon review.

## 2017-01-30 ENCOUNTER — Encounter: Payer: Self-pay | Admitting: Internal Medicine

## 2017-01-31 ENCOUNTER — Encounter: Payer: Self-pay | Admitting: Internal Medicine

## 2017-02-03 ENCOUNTER — Encounter: Payer: Self-pay | Admitting: Medical Oncology

## 2017-03-28 DIAGNOSIS — Z23 Encounter for immunization: Secondary | ICD-10-CM | POA: Diagnosis not present

## 2017-04-26 DIAGNOSIS — Z136 Encounter for screening for cardiovascular disorders: Secondary | ICD-10-CM | POA: Diagnosis not present

## 2017-04-26 DIAGNOSIS — M25519 Pain in unspecified shoulder: Secondary | ICD-10-CM | POA: Diagnosis not present

## 2017-04-26 DIAGNOSIS — C3491 Malignant neoplasm of unspecified part of right bronchus or lung: Secondary | ICD-10-CM | POA: Diagnosis not present

## 2017-04-26 DIAGNOSIS — M85851 Other specified disorders of bone density and structure, right thigh: Secondary | ICD-10-CM | POA: Diagnosis not present

## 2017-04-26 DIAGNOSIS — L719 Rosacea, unspecified: Secondary | ICD-10-CM | POA: Diagnosis not present

## 2017-04-26 DIAGNOSIS — Z91038 Other insect allergy status: Secondary | ICD-10-CM | POA: Diagnosis not present

## 2017-04-26 DIAGNOSIS — F411 Generalized anxiety disorder: Secondary | ICD-10-CM | POA: Diagnosis not present

## 2017-04-26 DIAGNOSIS — J309 Allergic rhinitis, unspecified: Secondary | ICD-10-CM | POA: Diagnosis not present

## 2017-04-26 DIAGNOSIS — Z0000 Encounter for general adult medical examination without abnormal findings: Secondary | ICD-10-CM | POA: Diagnosis not present

## 2017-04-26 DIAGNOSIS — Z131 Encounter for screening for diabetes mellitus: Secondary | ICD-10-CM | POA: Diagnosis not present

## 2017-05-02 DIAGNOSIS — C3491 Malignant neoplasm of unspecified part of right bronchus or lung: Secondary | ICD-10-CM | POA: Diagnosis not present

## 2017-05-02 DIAGNOSIS — K5900 Constipation, unspecified: Secondary | ICD-10-CM | POA: Diagnosis not present

## 2017-05-02 DIAGNOSIS — J309 Allergic rhinitis, unspecified: Secondary | ICD-10-CM | POA: Diagnosis not present

## 2017-05-02 DIAGNOSIS — Z91038 Other insect allergy status: Secondary | ICD-10-CM | POA: Diagnosis not present

## 2017-05-02 DIAGNOSIS — M8580 Other specified disorders of bone density and structure, unspecified site: Secondary | ICD-10-CM | POA: Diagnosis not present

## 2017-05-02 DIAGNOSIS — M25519 Pain in unspecified shoulder: Secondary | ICD-10-CM | POA: Diagnosis not present

## 2017-05-02 DIAGNOSIS — F411 Generalized anxiety disorder: Secondary | ICD-10-CM | POA: Diagnosis not present

## 2017-05-02 DIAGNOSIS — L719 Rosacea, unspecified: Secondary | ICD-10-CM | POA: Diagnosis not present

## 2017-05-02 DIAGNOSIS — Z0000 Encounter for general adult medical examination without abnormal findings: Secondary | ICD-10-CM | POA: Diagnosis not present

## 2017-05-02 DIAGNOSIS — N952 Postmenopausal atrophic vaginitis: Secondary | ICD-10-CM | POA: Diagnosis not present

## 2017-05-16 DIAGNOSIS — M25612 Stiffness of left shoulder, not elsewhere classified: Secondary | ICD-10-CM | POA: Diagnosis not present

## 2017-05-16 DIAGNOSIS — M25512 Pain in left shoulder: Secondary | ICD-10-CM | POA: Diagnosis not present

## 2017-05-16 DIAGNOSIS — M60812 Other myositis, left shoulder: Secondary | ICD-10-CM | POA: Diagnosis not present

## 2017-05-16 DIAGNOSIS — M62522 Muscle wasting and atrophy, not elsewhere classified, left upper arm: Secondary | ICD-10-CM | POA: Diagnosis not present

## 2017-05-19 DIAGNOSIS — M60812 Other myositis, left shoulder: Secondary | ICD-10-CM | POA: Diagnosis not present

## 2017-05-19 DIAGNOSIS — M62522 Muscle wasting and atrophy, not elsewhere classified, left upper arm: Secondary | ICD-10-CM | POA: Diagnosis not present

## 2017-05-19 DIAGNOSIS — M25612 Stiffness of left shoulder, not elsewhere classified: Secondary | ICD-10-CM | POA: Diagnosis not present

## 2017-05-19 DIAGNOSIS — M25512 Pain in left shoulder: Secondary | ICD-10-CM | POA: Diagnosis not present

## 2017-05-23 DIAGNOSIS — M60812 Other myositis, left shoulder: Secondary | ICD-10-CM | POA: Diagnosis not present

## 2017-05-23 DIAGNOSIS — M25512 Pain in left shoulder: Secondary | ICD-10-CM | POA: Diagnosis not present

## 2017-05-23 DIAGNOSIS — M62522 Muscle wasting and atrophy, not elsewhere classified, left upper arm: Secondary | ICD-10-CM | POA: Diagnosis not present

## 2017-05-23 DIAGNOSIS — M25612 Stiffness of left shoulder, not elsewhere classified: Secondary | ICD-10-CM | POA: Diagnosis not present

## 2017-05-24 DIAGNOSIS — M62522 Muscle wasting and atrophy, not elsewhere classified, left upper arm: Secondary | ICD-10-CM | POA: Diagnosis not present

## 2017-05-24 DIAGNOSIS — M25612 Stiffness of left shoulder, not elsewhere classified: Secondary | ICD-10-CM | POA: Diagnosis not present

## 2017-05-24 DIAGNOSIS — M60812 Other myositis, left shoulder: Secondary | ICD-10-CM | POA: Diagnosis not present

## 2017-05-24 DIAGNOSIS — M25512 Pain in left shoulder: Secondary | ICD-10-CM | POA: Diagnosis not present

## 2017-05-26 DIAGNOSIS — M25612 Stiffness of left shoulder, not elsewhere classified: Secondary | ICD-10-CM | POA: Diagnosis not present

## 2017-05-26 DIAGNOSIS — M62522 Muscle wasting and atrophy, not elsewhere classified, left upper arm: Secondary | ICD-10-CM | POA: Diagnosis not present

## 2017-05-26 DIAGNOSIS — M25512 Pain in left shoulder: Secondary | ICD-10-CM | POA: Diagnosis not present

## 2017-05-26 DIAGNOSIS — M60812 Other myositis, left shoulder: Secondary | ICD-10-CM | POA: Diagnosis not present

## 2017-05-29 DIAGNOSIS — M62522 Muscle wasting and atrophy, not elsewhere classified, left upper arm: Secondary | ICD-10-CM | POA: Diagnosis not present

## 2017-05-29 DIAGNOSIS — M25512 Pain in left shoulder: Secondary | ICD-10-CM | POA: Diagnosis not present

## 2017-05-29 DIAGNOSIS — M25612 Stiffness of left shoulder, not elsewhere classified: Secondary | ICD-10-CM | POA: Diagnosis not present

## 2017-05-29 DIAGNOSIS — M60812 Other myositis, left shoulder: Secondary | ICD-10-CM | POA: Diagnosis not present

## 2017-05-30 ENCOUNTER — Encounter: Payer: Self-pay | Admitting: Internal Medicine

## 2017-05-31 ENCOUNTER — Telehealth: Payer: Self-pay | Admitting: Internal Medicine

## 2017-05-31 ENCOUNTER — Telehealth: Payer: Self-pay | Admitting: *Deleted

## 2017-05-31 ENCOUNTER — Other Ambulatory Visit: Payer: Self-pay | Admitting: *Deleted

## 2017-05-31 DIAGNOSIS — Z91041 Radiographic dye allergy status: Secondary | ICD-10-CM

## 2017-05-31 DIAGNOSIS — H43812 Vitreous degeneration, left eye: Secondary | ICD-10-CM | POA: Diagnosis not present

## 2017-05-31 DIAGNOSIS — H5310 Unspecified subjective visual disturbances: Secondary | ICD-10-CM | POA: Diagnosis not present

## 2017-05-31 MED ORDER — PREDNISONE 50 MG PO TABS: ORAL_TABLET | ORAL | 1 refills | Status: DC

## 2017-05-31 NOTE — Telephone Encounter (Signed)
Returned call to pt with MD's recommendations. Pt agreed she would like to reschedule CT scan. Pt is out walking and would like a call back regarding new appt for scan.  Central scheduling called appt moved from 1/22 to 06/16/16 1230pm. Message to scheduling for lab and MD visit to be moved.

## 2017-05-31 NOTE — Telephone Encounter (Signed)
Scan moved and prednisone sent to [facility] per pt request as she has allergy to contrast. No further concerns.

## 2017-05-31 NOTE — Telephone Encounter (Signed)
-----   Message from Curt Bears, MD sent at 05/31/2017  8:54 AM EST ----- We can do it sooner if she is concerned ----- Message ----- From: Ardeen Garland, RN Sent: 05/31/2017   8:16 AM To: Lucile Crater, RN, Curt Bears, MD  ----- Message from [place], Generic sent at 05/30/2017 10:34 AM EST -----  Hi, I seem to be out of breadth more than I used to be. I do walk 2-3 miles a day, so maybe this is just winter or sinuses. But it is different. I have a CT scan scheduled for Jan 22. Should I wait or do the scan sooner? Thanks  [employer]

## 2017-05-31 NOTE — Telephone Encounter (Signed)
Scheduled appt per 12/19 sch message - patient is aware of appt date and time.

## 2017-06-01 DIAGNOSIS — M25612 Stiffness of left shoulder, not elsewhere classified: Secondary | ICD-10-CM | POA: Diagnosis not present

## 2017-06-01 DIAGNOSIS — M60812 Other myositis, left shoulder: Secondary | ICD-10-CM | POA: Diagnosis not present

## 2017-06-01 DIAGNOSIS — M62522 Muscle wasting and atrophy, not elsewhere classified, left upper arm: Secondary | ICD-10-CM | POA: Diagnosis not present

## 2017-06-01 DIAGNOSIS — M25512 Pain in left shoulder: Secondary | ICD-10-CM | POA: Diagnosis not present

## 2017-06-02 DIAGNOSIS — M25512 Pain in left shoulder: Secondary | ICD-10-CM | POA: Diagnosis not present

## 2017-06-02 DIAGNOSIS — M62522 Muscle wasting and atrophy, not elsewhere classified, left upper arm: Secondary | ICD-10-CM | POA: Diagnosis not present

## 2017-06-02 DIAGNOSIS — M60812 Other myositis, left shoulder: Secondary | ICD-10-CM | POA: Diagnosis not present

## 2017-06-02 DIAGNOSIS — M25612 Stiffness of left shoulder, not elsewhere classified: Secondary | ICD-10-CM | POA: Diagnosis not present

## 2017-06-04 DIAGNOSIS — M60812 Other myositis, left shoulder: Secondary | ICD-10-CM | POA: Diagnosis not present

## 2017-06-04 DIAGNOSIS — M25512 Pain in left shoulder: Secondary | ICD-10-CM | POA: Diagnosis not present

## 2017-06-04 DIAGNOSIS — M62522 Muscle wasting and atrophy, not elsewhere classified, left upper arm: Secondary | ICD-10-CM | POA: Diagnosis not present

## 2017-06-04 DIAGNOSIS — M25612 Stiffness of left shoulder, not elsewhere classified: Secondary | ICD-10-CM | POA: Diagnosis not present

## 2017-06-12 DIAGNOSIS — M25512 Pain in left shoulder: Secondary | ICD-10-CM | POA: Diagnosis not present

## 2017-06-12 DIAGNOSIS — M60812 Other myositis, left shoulder: Secondary | ICD-10-CM | POA: Diagnosis not present

## 2017-06-12 DIAGNOSIS — M62522 Muscle wasting and atrophy, not elsewhere classified, left upper arm: Secondary | ICD-10-CM | POA: Diagnosis not present

## 2017-06-12 DIAGNOSIS — M25612 Stiffness of left shoulder, not elsewhere classified: Secondary | ICD-10-CM | POA: Diagnosis not present

## 2017-06-14 DIAGNOSIS — M62522 Muscle wasting and atrophy, not elsewhere classified, left upper arm: Secondary | ICD-10-CM | POA: Diagnosis not present

## 2017-06-14 DIAGNOSIS — M25612 Stiffness of left shoulder, not elsewhere classified: Secondary | ICD-10-CM | POA: Diagnosis not present

## 2017-06-14 DIAGNOSIS — M60812 Other myositis, left shoulder: Secondary | ICD-10-CM | POA: Diagnosis not present

## 2017-06-14 DIAGNOSIS — M25512 Pain in left shoulder: Secondary | ICD-10-CM | POA: Diagnosis not present

## 2017-06-15 ENCOUNTER — Other Ambulatory Visit: Payer: Self-pay | Admitting: Medical Oncology

## 2017-06-15 DIAGNOSIS — M25512 Pain in left shoulder: Secondary | ICD-10-CM | POA: Diagnosis not present

## 2017-06-15 DIAGNOSIS — M25612 Stiffness of left shoulder, not elsewhere classified: Secondary | ICD-10-CM | POA: Diagnosis not present

## 2017-06-15 DIAGNOSIS — M62522 Muscle wasting and atrophy, not elsewhere classified, left upper arm: Secondary | ICD-10-CM | POA: Diagnosis not present

## 2017-06-15 DIAGNOSIS — M60812 Other myositis, left shoulder: Secondary | ICD-10-CM | POA: Diagnosis not present

## 2017-06-16 ENCOUNTER — Other Ambulatory Visit (HOSPITAL_BASED_OUTPATIENT_CLINIC_OR_DEPARTMENT_OTHER): Payer: Medicare Other

## 2017-06-16 ENCOUNTER — Ambulatory Visit (HOSPITAL_COMMUNITY)
Admission: RE | Admit: 2017-06-16 | Discharge: 2017-06-16 | Disposition: A | Discharge status: Home / Self Care | Payer: Medicare Other | Source: Ambulatory Visit | Attending: Internal Medicine | Admitting: Internal Medicine

## 2017-06-16 DIAGNOSIS — C3411 Malignant neoplasm of upper lobe, right bronchus or lung: Secondary | ICD-10-CM

## 2017-06-16 DIAGNOSIS — C3491 Malignant neoplasm of unspecified part of right bronchus or lung: Secondary | ICD-10-CM | POA: Diagnosis not present

## 2017-06-16 DIAGNOSIS — R918 Other nonspecific abnormal finding of lung field: Secondary | ICD-10-CM | POA: Insufficient documentation

## 2017-06-16 DIAGNOSIS — C3490 Malignant neoplasm of unspecified part of unspecified bronchus or lung: Secondary | ICD-10-CM | POA: Diagnosis not present

## 2017-06-16 LAB — COMPREHENSIVE METABOLIC PANEL
ALT: 18 U/L (ref 0–55)
AST: 21 U/L (ref 5–34)
Albumin: 4.5 g/dL (ref 3.5–5.0)
Alkaline Phosphatase: 68 U/L (ref 40–150)
Anion Gap: 7 mEq/L (ref 3–11)
BUN: 19.5 mg/dL (ref 7.0–26.0)
CO2: 28 mEq/L (ref 22–29)
Calcium: 10 mg/dL (ref 8.4–10.4)
Chloride: 104 mEq/L (ref 98–109)
Creatinine: 0.8 mg/dL (ref 0.6–1.1)
EGFR: >60 mL/min/{1.73_m2} (ref >60)
Glucose: 93 mg/dl (ref 70–140)
Potassium: 4.7 mEq/L (ref 3.5–5.1)
Sodium: 139 mEq/L (ref 136–145)
Total Bilirubin: 0.5 mg/dL (ref 0.20–1.20)
Total Protein: 7.3 g/dL (ref 6.4–8.3)

## 2017-06-16 LAB — CBC WITH DIFFERENTIAL/PLATELET
BASO%: 0.3 % (ref 0.0–2.0)
Basophils Absolute: 0 10*3/uL (ref 0.0–0.1)
EOS%: 0 % (ref 0.0–7.0)
Eosinophils Absolute: 0 10*3/uL (ref 0.0–0.5)
HCT: 44.1 % (ref 34.8–46.6)
HGB: 15 g/dL (ref 11.6–15.9)
LYMPH%: 8.4 % — ABNORMAL LOW (ref 14.0–49.7)
MCH: 31.1 pg (ref 25.1–34.0)
MCHC: 34.1 g/dL (ref 31.5–36.0)
MCV: 91.3 fL (ref 79.5–101.0)
MONO#: 0.5 10*3/uL (ref 0.1–0.9)
MONO%: 5.1 % (ref 0.0–14.0)
NEUT#: 8.7 10*3/uL — ABNORMAL HIGH (ref 1.5–6.5)
NEUT%: 86.2 % — ABNORMAL HIGH (ref 38.4–76.8)
Platelets: 258 10*3/uL (ref 145–400)
RBC: 4.83 10*6/uL (ref 3.70–5.45)
RDW: 13 % (ref 11.2–14.5)
WBC: 10.1 10*3/uL (ref 3.9–10.3)
lymph#: 0.9 10*3/uL (ref 0.9–3.3)

## 2017-06-16 MED ORDER — IOPAMIDOL (ISOVUE-300) INJECTION 61%
INTRAVENOUS | Status: AC
  Administered 2017-06-16: 75 mL via INTRAVENOUS
  Filled 2017-06-16: qty 75

## 2017-06-19 DIAGNOSIS — M25512 Pain in left shoulder: Secondary | ICD-10-CM | POA: Diagnosis not present

## 2017-06-19 DIAGNOSIS — M62522 Muscle wasting and atrophy, not elsewhere classified, left upper arm: Secondary | ICD-10-CM | POA: Diagnosis not present

## 2017-06-19 DIAGNOSIS — M25612 Stiffness of left shoulder, not elsewhere classified: Secondary | ICD-10-CM | POA: Diagnosis not present

## 2017-06-19 DIAGNOSIS — M60812 Other myositis, left shoulder: Secondary | ICD-10-CM | POA: Diagnosis not present

## 2017-06-20 ENCOUNTER — Telehealth: Payer: Self-pay | Admitting: Internal Medicine

## 2017-06-20 ENCOUNTER — Other Ambulatory Visit: Payer: Self-pay

## 2017-06-20 ENCOUNTER — Encounter: Payer: Self-pay | Admitting: Oncology

## 2017-06-20 ENCOUNTER — Inpatient Hospital Stay: Payer: Medicare Other | Attending: Internal Medicine | Admitting: Oncology

## 2017-06-20 VITALS — BP 121/76 | HR 74 | Temp 98.0°F | Resp 18 | Ht 59.5 in | Wt 92.0 lb

## 2017-06-20 DIAGNOSIS — Z85118 Personal history of other malignant neoplasm of bronchus and lung: Secondary | ICD-10-CM | POA: Insufficient documentation

## 2017-06-20 DIAGNOSIS — R911 Solitary pulmonary nodule: Secondary | ICD-10-CM | POA: Diagnosis not present

## 2017-06-20 DIAGNOSIS — R0602 Shortness of breath: Secondary | ICD-10-CM | POA: Insufficient documentation

## 2017-06-20 DIAGNOSIS — Z9221 Personal history of antineoplastic chemotherapy: Secondary | ICD-10-CM | POA: Insufficient documentation

## 2017-06-20 DIAGNOSIS — Z923 Personal history of irradiation: Secondary | ICD-10-CM | POA: Diagnosis not present

## 2017-06-20 DIAGNOSIS — C3491 Malignant neoplasm of unspecified part of right bronchus or lung: Secondary | ICD-10-CM

## 2017-06-20 NOTE — Telephone Encounter (Signed)
Gave avs and calendar for june °

## 2017-06-20 NOTE — Assessment & Plan Note (Signed)
This is a very pleasant 67 year old white female with stage IIIa non-small cell lung cancer, adenocarcinoma with positive EGFR mutation in exon 19 status post right upper lobectomy with lymph node dissection followed by adjuvant systemic chemotherapy as well as adjuvant radiation. The patient is currently on observation and she is feeling fine.  The patient was seen with Dr. Julien Nordmann. She had repeat CT scan of the chest that showed no clear evidence for disease recurrence.  Results were discussed with the patient and her husband and images were shown to them.  I recommended for her to continue in observation with repeat CT scan of the chest in 6 months.  The patient will be going out of the country on vacation in late June and prefers to have her CT prior to that trip.  Orders placed to have her CT done and follow-up visit in approximately 1 week prior to going out of the country.   She was advised to call immediately if she has any concerning symptoms in the interval. The patient voices understanding of current disease status and treatment options and is in agreement with the current care plan. All questions were answered. The patient knows to call the clinic with any problems, questions or concerns. We can certainly see the patient much sooner if necessary.

## 2017-06-20 NOTE — Progress Notes (Signed)
[place] OFFICE PROGRESS NOTE  [name] West, [place] [address]  DIAGNOSIS: Stage IIIA (T2a, N2, M0) non-small cell lung cancer, adenocarcinoma with positive EGFR mutation with deletion in exon 19, presented with right upper lobe lung mass and mediastinal lymphadenopathy, status post surgical resection February 2017.  PRIOR THERAPY: 1) Right VATS with right upper lobectomy with bronchoplasty closure and en bloc resection of a wedge from the superior segment of the right lower lobe in addition to mediastinal lymph node dissection on 07/30/2015 under the care of Dr. Roxan Hockey. 2) Adjuvant systemic chemotherapy with cisplatin 75 MG/M2 and Alimta 500 MG/M2 every 3 weeks. First dose was given on 09/10/2015. Status post 4cycles. 3) adjuvant radiotherapy under the care of Dr. Tammi Klippel.  CURRENT THERAPY: Observation  INTERVAL HISTORY: [name] West 67 y.o. female returns for routine follow-up visit accompanied by her husband.  The patient is feeling fine today with no specific complaints except noticing that she becomes more short of breath at times.  She reports that she felt this way last winter as well.  Shortness of breath does not occur all the time.  She denies fevers and chills.  Denies chest pain, cough, hemoptysis.  Denies nausea, vomiting, constipation, diarrhea.  She has not had any recent weight loss or night sweats.  The patient had a repeat CT scan of the chest performed is here to discuss the results.  MEDICAL HISTORY: Past Medical History:  Diagnosis Date  . Anxiety   . Arthritis    shoulders  . Cancer ([place])    Lung Cancer  . Complication of anesthesia    nausea  . Cough 09/17/2015  . Encounter for antineoplastic chemotherapy 10/22/2015  . Environmental and seasonal allergies   . Family history of adverse reaction to anesthesia    Patients grandmother died while having hand surgery; pt unsure of cause  . Oral thrush 10/22/2015  .  Ovarian cyst   . PONV (postoperative nausea and vomiting)   . Raynaud disease     ALLERGIES:  is allergic to avelox [moxifloxacin hcl in nacl]; cephalosporins; clindamycin/lincomycin; penicillins; macrobid [nitrofurantoin monohyd macro]; omnipaque [iohexol]; ciprofloxacin; adhesive [tape]; doxycycline; lidoderm [lidocaine]; pseudoephedrine; and sulfa antibiotics.  MEDICATIONS:  Current Outpatient Medications  Medication Sig Dispense Refill  . Azelaic Acid (FINACEA) 15 % cream Apply 1 application topically 2 (two) times daily. After skin is thoroughly washed and patted dry, gently but thoroughly massage a thin film of azelaic acid cream into the affected area twice daily, in the morning and evening.    . Brimonidine Tartrate (MIRVASO) 0.33 % GEL Apply 1 application topically daily.    . cholecalciferol (VITAMIN D) 1000 UNITS tablet Take 1,000 Units by mouth daily.    . citalopram (CELEXA) 10 MG tablet Take 10 mg by mouth every morning.    . fexofenadine (ALLEGRA) 180 MG tablet Take 180 mg by mouth daily.    Marland Kitchen ibuprofen (ADVIL,MOTRIN) 200 MG tablet Take 200 mg by mouth every 6 (six) hours as needed for mild pain or moderate pain.    Marland Kitchen LORazepam (ATIVAN) 0.5 MG tablet Take 0.25 mg by mouth at bedtime as needed for sleep. Reported on 10/07/2015    . Multiple Vitamin (MULTIVITAMIN WITH MINERALS) TABS Take 1 tablet by mouth daily.    . Wheat Dextrin (BENEFIBER) POWD Take 1 Dose by mouth daily.    Marland Kitchen acetaminophen (TYLENOL) 500 MG tablet Take 2 tablets (1,000 mg total) by mouth every 6 (six) hours  as needed. (Patient not taking: Reported on 09/01/2016) 30 tablet 0  . EPINEPHrine 0.3 mg/0.3 mL IJ SOAJ injection Inject 0.3 mg into the muscle as needed (For allergic reaction).    . phenylephrine (SUDAFED PE) 10 MG TABS tablet Take 10 mg by mouth daily as needed.    . predniSONE (DELTASONE) 50 MG tablet Take one tablet ( 50 mg) 13 hours and 2 hours prior to scan. (Patient not taking: Reported on 06/20/2017) 2  tablet 1  . zolpidem (AMBIEN) 5 MG tablet Take 5 mg by mouth at bedtime as needed for sleep. Reported on 01/01/2016     No current facility-administered medications for this visit.     SURGICAL HISTORY:  Past Surgical History:  Procedure Laterality Date  . CESAREAN SECTION  1984  . COLONOSCOPY WITH PROPOFOL N/A 09/27/2012   Procedure: COLONOSCOPY WITH PROPOFOL;  Surgeon: Garlan Fair, MD;  Location: [facility];  Service: Endoscopy;  Laterality: N/A;  . Fibroid Removed    . LAPAROTOMY Left 07/20/2016   Procedure: MINI LAPAROTOMY, REMOVAL OF LEFT OVARIAN CYST, LEFT SALPINGO-OOPHORECTOMY;  Surgeon: Dian Queen, MD;  Location: [facility];  Service: Gynecology;  Laterality: Left;  please moved to 7:30am if spot opens  . Right Rotator Cuff Right   . VIDEO ASSISTED THORACOSCOPY (VATS)/ LOBECTOMY Right 07/30/2015   Procedure: RIGHT VIDEO ASSISTED THORACOSCOPY (VATS)/RIGHT UPPER LOBECTOMY;  Surgeon: Melrose Nakayama, MD;  Location: [facility];  Service: Thoracic;  Laterality: Right;  Marland Kitchen VIDEO BRONCHOSCOPY WITH ENDOBRONCHIAL NAVIGATION N/A 07/15/2015   Procedure: VIDEO BRONCHOSCOPY WITH ENDOBRONCHIAL NAVIGATION with Biopsy;  Surgeon: Collene Gobble, MD;  Location: [facility];  Service: Thoracic;  Laterality: N/A;    REVIEW OF SYSTEMS:   Review of Systems  Constitutional: Negative for appetite change, chills, fatigue, fever and unexpected weight change.  HENT:   Negative for mouth sores, nosebleeds, sore throat and trouble swallowing.   Eyes: Negative for eye problems and icterus.  Respiratory: Negative for cough, hemoptysis, and wheezing.  Positive for intermittent shortness of breath.  Cardiovascular: Negative for chest pain and leg swelling.  Gastrointestinal: Negative for abdominal pain, constipation, diarrhea, nausea and vomiting.  Genitourinary: Negative for bladder incontinence, difficulty urinating, dysuria, frequency and hematuria.   Musculoskeletal: Negative for back pain, gait problem, neck  pain and neck stiffness.  Skin: Negative for itching and rash.  Neurological: Negative for dizziness, extremity weakness, gait problem, headaches, light-headedness and seizures.  Hematological: Negative for adenopathy. Does not bruise/bleed easily.  Psychiatric/Behavioral: Negative for confusion, depression and sleep disturbance. The patient is not nervous/anxious.     PHYSICAL EXAMINATION:  Blood pressure 121/76, pulse 74, temperature 98 F (36.7 C), temperature source Oral, resp. rate 18, height 4' 11.5" (1.511 m), weight 92 lb (41.7 kg), SpO2 100 %.  ECOG PERFORMANCE STATUS: 1 - Symptomatic but completely ambulatory  Physical Exam  Constitutional: Oriented to person, place, and time and well-developed, well-nourished, and in no distress. No distress.  HENT:  Head: Normocephalic and atraumatic.  Mouth/Throat: Oropharynx is clear and moist. No oropharyngeal exudate.  Eyes: Conjunctivae are normal. Right eye exhibits no discharge. Left eye exhibits no discharge. No scleral icterus.  Neck: Normal range of motion. Neck supple.  Cardiovascular: Normal rate, regular rhythm, normal heart sounds and intact distal pulses.   Pulmonary/Chest: Effort normal and breath sounds normal. No respiratory distress. No wheezes. No rales.  Abdominal: Soft. Bowel sounds are normal. Exhibits no distension and no mass. There is no tenderness.  Musculoskeletal: Normal range of motion.  Exhibits no edema.  Lymphadenopathy:    No cervical adenopathy.  Neurological: Alert and oriented to person, place, and time. Exhibits normal muscle tone. Gait normal. Coordination normal.  Skin: Skin is warm and dry. No rash noted. Not diaphoretic. No erythema. No pallor.  Psychiatric: Mood, memory and judgment normal.  Vitals reviewed.  LABORATORY DATA: Lab Results  Component Value Date   WBC 10.1 06/16/2017   HGB 15.0 06/16/2017   HCT 44.1 06/16/2017   MCV 91.3 06/16/2017   PLT 258 06/16/2017      Chemistry       Component Value Date/Time   NA 139 06/16/2017 1132   K 4.7 06/16/2017 1132   CL 97 (L) 08/14/2015 2201   CO2 28 06/16/2017 1132   BUN 19.5 06/16/2017 1132   CREATININE 0.8 06/16/2017 1132      Component Value Date/Time   CALCIUM 10.0 06/16/2017 1132   ALKPHOS 68 06/16/2017 1132   AST 21 06/16/2017 1132   ALT 18 06/16/2017 1132   BILITOT 0.50 06/16/2017 1132       RADIOGRAPHIC STUDIES:  Ct Chest W Contrast  Result Date: 06/16/2017 CLINICAL DATA:  Non-small cell right lung cancer EXAM: CT CHEST WITH CONTRAST TECHNIQUE: Multidetector CT imaging of the chest was performed during intravenous contrast administration. CONTRAST:  84m ISOVUE-300 IOPAMIDOL (ISOVUE-300) INJECTION 61% COMPARISON:  01/07/2017 FINDINGS: Cardiovascular: Cardiopericardial silhouette is at upper limits of normal for size. Similar appearance trace pericardial fluid. No thoracic aortic aneurysm. Mediastinum/Nodes: No mediastinal lymphadenopathy. There is no hilar lymphadenopathy. The esophagus has normal imaging features. There is no axillary lymphadenopathy. Lungs/Pleura: Postsurgical change noted right hemithorax. Similar appearance of scarring. 5 mm right posterior subpleural nodule measured previously is stable (image 80 series 5 today). The 3 mm right pulmonary nodule seen on image 75 of series 5 is new in the interval. 5 mm subpleural posterior right lung nodule seen image 68 is unchanged. Several other scattered tiny pulmonary nodules are similar, including the 4 mm left lower lobe nodule seen on image 103 of series 5. Upper Abdomen: Unremarkable. Musculoskeletal: Bone windows reveal no worrisome lytic or sclerotic osseous lesions. IMPRESSION: 1. Stable exam. No definite evidence for recurrent or metastatic disease. 2. Scattered tiny bilateral pulmonary nodules are similar to prior. There is a new very tiny nodule in the posterior right lung, likely benign, but attention on follow-up recommended. Electronically Signed    By: EMisty StanleyM.D.   On: 06/16/2017 14:12     ASSESSMENT/PLAN:  Non-small cell cancer of right lung ([facility] This is a very pleasant 67year old white female with stage IIIa non-small cell lung cancer, adenocarcinoma with positive EGFR mutation in exon 19 status post right upper lobectomy with lymph node dissection followed by adjuvant systemic chemotherapy as well as adjuvant radiation. The patient is currently on observation and she is feeling fine.  The patient was seen with Dr. MJulien Nordmann She had repeat CT scan of the chest that showed no clear evidence for disease recurrence.  Results were discussed with the patient and her husband and images were shown to them.  I recommended for her to continue in observation with repeat CT scan of the chest in 6 months.  The patient will be going out of the country on vacation in late June and prefers to have her CT prior to that trip.  Orders placed to have her CT done and follow-up visit in approximately 1 week prior to going out of the country.   She was  advised to call immediately if she has any concerning symptoms in the interval. The patient voices understanding of current disease status and treatment options and is in agreement with the current care plan. All questions were answered. The patient knows to call the clinic with any problems, questions or concerns. We can certainly see the patient much sooner if necessary.  Orders Placed This Encounter  Procedures  . CT CHEST W CONTRAST    Standing Status:   Future    Standing Expiration Date:   06/20/2018    Order Specific Question:   If indicated for the ordered procedure, I authorize the administration of contrast media per Radiology protocol    Answer:   Yes    Order Specific Question:   Preferred imaging location?    Answer:   [facility]    Order Specific Question:   Call Results- Best Contact Number?    Answer:   lung cancer. restaging.    Order Specific Question:   Radiology  Contrast Protocol - do NOT remove file path    Answer:   [email or web address]  . CBC with Differential ([place] [facility] Only)    Standing Status:   Future    Standing Expiration Date:   06/20/2018  . CMP ([facility] only)    Standing Status:   Future    Standing Expiration Date:   06/20/2018    Mikey Bussing, DNP, AGPCNP-BC, AOCNP 06/20/17  ADDENDUM: Hematology/Oncology Attending: I had a face-to-face encounter with the patient.  I recommended her care plan.  This is a very pleasant 67 years old white female with history of a stage IIIa non-small cell lung cancer, adenocarcinoma with positive EGFR mutation in exon 19 status post right upper lobectomy with lymph node dissection followed by adjuvant systemic chemotherapy as well as adjuvant radiation.  She has been in observation for a few years now The patient has no complaints today.  Repeat CT scan of the chest performed recently showed no concerning findings for disease progression except for new 3 mm nodule that is likely benign but require close observation on upcoming scans. I discussed the scan results with the patient and her husband.  I recommended for her to continue on observation with repeat CT scan of the chest in 6 months for restaging of her disease. She was advised to call immediately if she has any concerning symptoms in the interval.  Disclaimer: This note was dictated with voice recognition software. Similar sounding words can inadvertently be transcribed and may be missed upon review. Eilleen Kempf, MD 06/21/17

## 2017-06-21 DIAGNOSIS — M25512 Pain in left shoulder: Secondary | ICD-10-CM | POA: Diagnosis not present

## 2017-06-21 DIAGNOSIS — M62522 Muscle wasting and atrophy, not elsewhere classified, left upper arm: Secondary | ICD-10-CM | POA: Diagnosis not present

## 2017-06-21 DIAGNOSIS — M25612 Stiffness of left shoulder, not elsewhere classified: Secondary | ICD-10-CM | POA: Diagnosis not present

## 2017-06-21 DIAGNOSIS — M60812 Other myositis, left shoulder: Secondary | ICD-10-CM | POA: Diagnosis not present

## 2017-06-23 DIAGNOSIS — M60812 Other myositis, left shoulder: Secondary | ICD-10-CM | POA: Diagnosis not present

## 2017-06-23 DIAGNOSIS — M62522 Muscle wasting and atrophy, not elsewhere classified, left upper arm: Secondary | ICD-10-CM | POA: Diagnosis not present

## 2017-06-23 DIAGNOSIS — M25512 Pain in left shoulder: Secondary | ICD-10-CM | POA: Diagnosis not present

## 2017-06-23 DIAGNOSIS — M25612 Stiffness of left shoulder, not elsewhere classified: Secondary | ICD-10-CM | POA: Diagnosis not present

## 2017-06-27 DIAGNOSIS — M60812 Other myositis, left shoulder: Secondary | ICD-10-CM | POA: Diagnosis not present

## 2017-06-27 DIAGNOSIS — M25612 Stiffness of left shoulder, not elsewhere classified: Secondary | ICD-10-CM | POA: Diagnosis not present

## 2017-06-27 DIAGNOSIS — M62522 Muscle wasting and atrophy, not elsewhere classified, left upper arm: Secondary | ICD-10-CM | POA: Diagnosis not present

## 2017-06-27 DIAGNOSIS — M25512 Pain in left shoulder: Secondary | ICD-10-CM | POA: Diagnosis not present

## 2017-06-28 DIAGNOSIS — M60812 Other myositis, left shoulder: Secondary | ICD-10-CM | POA: Diagnosis not present

## 2017-06-28 DIAGNOSIS — M25512 Pain in left shoulder: Secondary | ICD-10-CM | POA: Diagnosis not present

## 2017-06-28 DIAGNOSIS — M62522 Muscle wasting and atrophy, not elsewhere classified, left upper arm: Secondary | ICD-10-CM | POA: Diagnosis not present

## 2017-06-28 DIAGNOSIS — M25612 Stiffness of left shoulder, not elsewhere classified: Secondary | ICD-10-CM | POA: Diagnosis not present

## 2017-06-30 DIAGNOSIS — M62522 Muscle wasting and atrophy, not elsewhere classified, left upper arm: Secondary | ICD-10-CM | POA: Diagnosis not present

## 2017-06-30 DIAGNOSIS — M25512 Pain in left shoulder: Secondary | ICD-10-CM | POA: Diagnosis not present

## 2017-06-30 DIAGNOSIS — M25612 Stiffness of left shoulder, not elsewhere classified: Secondary | ICD-10-CM | POA: Diagnosis not present

## 2017-06-30 DIAGNOSIS — M60812 Other myositis, left shoulder: Secondary | ICD-10-CM | POA: Diagnosis not present

## 2017-07-03 ENCOUNTER — Other Ambulatory Visit: Payer: Medicare Other

## 2017-07-03 DIAGNOSIS — M62522 Muscle wasting and atrophy, not elsewhere classified, left upper arm: Secondary | ICD-10-CM | POA: Diagnosis not present

## 2017-07-03 DIAGNOSIS — M25512 Pain in left shoulder: Secondary | ICD-10-CM | POA: Diagnosis not present

## 2017-07-03 DIAGNOSIS — M25612 Stiffness of left shoulder, not elsewhere classified: Secondary | ICD-10-CM | POA: Diagnosis not present

## 2017-07-03 DIAGNOSIS — M60812 Other myositis, left shoulder: Secondary | ICD-10-CM | POA: Diagnosis not present

## 2017-07-04 ENCOUNTER — Ambulatory Visit (HOSPITAL_COMMUNITY): Payer: Medicare Other

## 2017-07-04 ENCOUNTER — Other Ambulatory Visit: Payer: Medicare Other

## 2017-07-04 DIAGNOSIS — M25612 Stiffness of left shoulder, not elsewhere classified: Secondary | ICD-10-CM | POA: Diagnosis not present

## 2017-07-04 DIAGNOSIS — M60812 Other myositis, left shoulder: Secondary | ICD-10-CM | POA: Diagnosis not present

## 2017-07-04 DIAGNOSIS — M25512 Pain in left shoulder: Secondary | ICD-10-CM | POA: Diagnosis not present

## 2017-07-04 DIAGNOSIS — M62522 Muscle wasting and atrophy, not elsewhere classified, left upper arm: Secondary | ICD-10-CM | POA: Diagnosis not present

## 2017-07-06 ENCOUNTER — Ambulatory Visit: Payer: Medicare Other | Admitting: Internal Medicine

## 2017-07-11 DIAGNOSIS — M60812 Other myositis, left shoulder: Secondary | ICD-10-CM | POA: Diagnosis not present

## 2017-07-11 DIAGNOSIS — M62522 Muscle wasting and atrophy, not elsewhere classified, left upper arm: Secondary | ICD-10-CM | POA: Diagnosis not present

## 2017-07-11 DIAGNOSIS — M25612 Stiffness of left shoulder, not elsewhere classified: Secondary | ICD-10-CM | POA: Diagnosis not present

## 2017-07-11 DIAGNOSIS — M25512 Pain in left shoulder: Secondary | ICD-10-CM | POA: Diagnosis not present

## 2017-07-13 DIAGNOSIS — M25512 Pain in left shoulder: Secondary | ICD-10-CM | POA: Diagnosis not present

## 2017-07-13 DIAGNOSIS — H43812 Vitreous degeneration, left eye: Secondary | ICD-10-CM | POA: Diagnosis not present

## 2017-07-13 DIAGNOSIS — M62522 Muscle wasting and atrophy, not elsewhere classified, left upper arm: Secondary | ICD-10-CM | POA: Diagnosis not present

## 2017-07-13 DIAGNOSIS — M25612 Stiffness of left shoulder, not elsewhere classified: Secondary | ICD-10-CM | POA: Diagnosis not present

## 2017-07-13 DIAGNOSIS — M60812 Other myositis, left shoulder: Secondary | ICD-10-CM | POA: Diagnosis not present

## 2017-07-18 DIAGNOSIS — M62522 Muscle wasting and atrophy, not elsewhere classified, left upper arm: Secondary | ICD-10-CM | POA: Diagnosis not present

## 2017-07-18 DIAGNOSIS — M25612 Stiffness of left shoulder, not elsewhere classified: Secondary | ICD-10-CM | POA: Diagnosis not present

## 2017-07-18 DIAGNOSIS — M60812 Other myositis, left shoulder: Secondary | ICD-10-CM | POA: Diagnosis not present

## 2017-07-18 DIAGNOSIS — M25512 Pain in left shoulder: Secondary | ICD-10-CM | POA: Diagnosis not present

## 2017-07-20 DIAGNOSIS — M25512 Pain in left shoulder: Secondary | ICD-10-CM | POA: Diagnosis not present

## 2017-07-20 DIAGNOSIS — M62522 Muscle wasting and atrophy, not elsewhere classified, left upper arm: Secondary | ICD-10-CM | POA: Diagnosis not present

## 2017-07-20 DIAGNOSIS — M60812 Other myositis, left shoulder: Secondary | ICD-10-CM | POA: Diagnosis not present

## 2017-07-20 DIAGNOSIS — M25612 Stiffness of left shoulder, not elsewhere classified: Secondary | ICD-10-CM | POA: Diagnosis not present

## 2017-07-20 DIAGNOSIS — Z681 Body mass index (BMI) 19 or less, adult: Secondary | ICD-10-CM | POA: Diagnosis not present

## 2017-07-20 DIAGNOSIS — Z124 Encounter for screening for malignant neoplasm of cervix: Secondary | ICD-10-CM | POA: Diagnosis not present

## 2017-07-24 DIAGNOSIS — M25612 Stiffness of left shoulder, not elsewhere classified: Secondary | ICD-10-CM | POA: Diagnosis not present

## 2017-07-24 DIAGNOSIS — M25512 Pain in left shoulder: Secondary | ICD-10-CM | POA: Diagnosis not present

## 2017-07-24 DIAGNOSIS — M62522 Muscle wasting and atrophy, not elsewhere classified, left upper arm: Secondary | ICD-10-CM | POA: Diagnosis not present

## 2017-07-24 DIAGNOSIS — M60812 Other myositis, left shoulder: Secondary | ICD-10-CM | POA: Diagnosis not present

## 2017-07-27 DIAGNOSIS — M25612 Stiffness of left shoulder, not elsewhere classified: Secondary | ICD-10-CM | POA: Diagnosis not present

## 2017-07-27 DIAGNOSIS — M60812 Other myositis, left shoulder: Secondary | ICD-10-CM | POA: Diagnosis not present

## 2017-07-27 DIAGNOSIS — M25512 Pain in left shoulder: Secondary | ICD-10-CM | POA: Diagnosis not present

## 2017-07-27 DIAGNOSIS — M62522 Muscle wasting and atrophy, not elsewhere classified, left upper arm: Secondary | ICD-10-CM | POA: Diagnosis not present

## 2017-08-01 DIAGNOSIS — M25512 Pain in left shoulder: Secondary | ICD-10-CM | POA: Diagnosis not present

## 2017-08-01 DIAGNOSIS — M60812 Other myositis, left shoulder: Secondary | ICD-10-CM | POA: Diagnosis not present

## 2017-08-01 DIAGNOSIS — M25612 Stiffness of left shoulder, not elsewhere classified: Secondary | ICD-10-CM | POA: Diagnosis not present

## 2017-08-01 DIAGNOSIS — M62522 Muscle wasting and atrophy, not elsewhere classified, left upper arm: Secondary | ICD-10-CM | POA: Diagnosis not present

## 2017-08-03 DIAGNOSIS — M60812 Other myositis, left shoulder: Secondary | ICD-10-CM | POA: Diagnosis not present

## 2017-08-03 DIAGNOSIS — M62522 Muscle wasting and atrophy, not elsewhere classified, left upper arm: Secondary | ICD-10-CM | POA: Diagnosis not present

## 2017-08-03 DIAGNOSIS — M25612 Stiffness of left shoulder, not elsewhere classified: Secondary | ICD-10-CM | POA: Diagnosis not present

## 2017-08-03 DIAGNOSIS — M25512 Pain in left shoulder: Secondary | ICD-10-CM | POA: Diagnosis not present

## 2017-08-10 DIAGNOSIS — M25519 Pain in unspecified shoulder: Secondary | ICD-10-CM | POA: Diagnosis not present

## 2017-08-10 DIAGNOSIS — I7300 Raynaud's syndrome without gangrene: Secondary | ICD-10-CM | POA: Diagnosis not present

## 2017-08-11 DIAGNOSIS — M62522 Muscle wasting and atrophy, not elsewhere classified, left upper arm: Secondary | ICD-10-CM | POA: Diagnosis not present

## 2017-08-11 DIAGNOSIS — M60812 Other myositis, left shoulder: Secondary | ICD-10-CM | POA: Diagnosis not present

## 2017-08-11 DIAGNOSIS — M25612 Stiffness of left shoulder, not elsewhere classified: Secondary | ICD-10-CM | POA: Diagnosis not present

## 2017-08-11 DIAGNOSIS — M25512 Pain in left shoulder: Secondary | ICD-10-CM | POA: Diagnosis not present

## 2017-09-13 DIAGNOSIS — M13812 Other specified arthritis, left shoulder: Secondary | ICD-10-CM | POA: Diagnosis not present

## 2017-09-13 DIAGNOSIS — M25512 Pain in left shoulder: Secondary | ICD-10-CM | POA: Diagnosis not present

## 2017-09-19 DIAGNOSIS — R350 Frequency of micturition: Secondary | ICD-10-CM | POA: Diagnosis not present

## 2017-10-19 DIAGNOSIS — H25013 Cortical age-related cataract, bilateral: Secondary | ICD-10-CM | POA: Diagnosis not present

## 2017-10-19 DIAGNOSIS — H43812 Vitreous degeneration, left eye: Secondary | ICD-10-CM | POA: Diagnosis not present

## 2017-10-19 DIAGNOSIS — H524 Presbyopia: Secondary | ICD-10-CM | POA: Diagnosis not present

## 2017-10-19 DIAGNOSIS — H2513 Age-related nuclear cataract, bilateral: Secondary | ICD-10-CM | POA: Diagnosis not present

## 2017-11-10 ENCOUNTER — Telehealth: Payer: Self-pay | Admitting: *Deleted

## 2017-11-10 NOTE — Telephone Encounter (Signed)
Pt called regarding her appt 6/19- pt advised she was told her appt with MD could be changed to different day as she will be going to [place]. Informed pt I do not see any changes to her schedule at this time.At this time he does nothave any openings sooner. Pt advised she will call back Monday to see if there have been an openings.

## 2017-11-13 ENCOUNTER — Telehealth: Payer: Self-pay | Admitting: Internal Medicine

## 2017-11-13 DIAGNOSIS — F430 Acute stress reaction: Secondary | ICD-10-CM | POA: Diagnosis not present

## 2017-11-13 DIAGNOSIS — F5102 Adjustment insomnia: Secondary | ICD-10-CM | POA: Diagnosis not present

## 2017-11-13 DIAGNOSIS — R3915 Urgency of urination: Secondary | ICD-10-CM | POA: Diagnosis not present

## 2017-11-13 NOTE — Telephone Encounter (Signed)
Spoke w/ pt regarding change in appt on 6/19.  Pt good with this appointment time.

## 2017-11-14 ENCOUNTER — Other Ambulatory Visit: Payer: Self-pay | Admitting: Family Medicine

## 2017-11-14 DIAGNOSIS — Z1231 Encounter for screening mammogram for malignant neoplasm of breast: Secondary | ICD-10-CM

## 2017-11-20 DIAGNOSIS — N952 Postmenopausal atrophic vaginitis: Secondary | ICD-10-CM | POA: Diagnosis not present

## 2017-11-20 DIAGNOSIS — J3489 Other specified disorders of nose and nasal sinuses: Secondary | ICD-10-CM | POA: Diagnosis not present

## 2017-11-27 ENCOUNTER — Other Ambulatory Visit: Payer: Medicare Other

## 2017-11-27 ENCOUNTER — Ambulatory Visit (HOSPITAL_COMMUNITY)
Admission: RE | Admit: 2017-11-27 | Discharge: 2017-11-27 | Disposition: A | Discharge status: Home / Self Care | Payer: Medicare Other | Source: Ambulatory Visit | Attending: Oncology | Admitting: Oncology

## 2017-11-27 ENCOUNTER — Inpatient Hospital Stay: Payer: Medicare Other | Attending: Internal Medicine

## 2017-11-27 DIAGNOSIS — C3491 Malignant neoplasm of unspecified part of right bronchus or lung: Secondary | ICD-10-CM

## 2017-11-27 DIAGNOSIS — C3411 Malignant neoplasm of upper lobe, right bronchus or lung: Secondary | ICD-10-CM | POA: Insufficient documentation

## 2017-11-27 DIAGNOSIS — R918 Other nonspecific abnormal finding of lung field: Secondary | ICD-10-CM | POA: Insufficient documentation

## 2017-11-27 DIAGNOSIS — Z902 Acquired absence of lung [part of]: Secondary | ICD-10-CM | POA: Insufficient documentation

## 2017-11-27 DIAGNOSIS — I2510 Atherosclerotic heart disease of native coronary artery without angina pectoris: Secondary | ICD-10-CM | POA: Insufficient documentation

## 2017-11-27 DIAGNOSIS — J432 Centrilobular emphysema: Secondary | ICD-10-CM | POA: Diagnosis not present

## 2017-11-27 DIAGNOSIS — I700 Atherosclerosis of aorta: Secondary | ICD-10-CM | POA: Insufficient documentation

## 2017-11-27 DIAGNOSIS — C3490 Malignant neoplasm of unspecified part of unspecified bronchus or lung: Secondary | ICD-10-CM | POA: Diagnosis not present

## 2017-11-27 LAB — CBC WITH DIFFERENTIAL (CANCER CENTER ONLY)
Basophils Absolute: 0 10*3/uL (ref 0.0–0.1)
Basophils Relative: 0 %
Eosinophils Absolute: 0 10*3/uL (ref 0.0–0.5)
Eosinophils Relative: 0 %
HCT: 45 % (ref 34.8–46.6)
Hemoglobin: 15.3 g/dL (ref 11.6–15.9)
Lymphocytes Relative: 8 %
Lymphs Abs: 0.8 10*3/uL — ABNORMAL LOW (ref 0.9–3.3)
MCH: 31.2 pg (ref 25.1–34.0)
MCHC: 34 g/dL (ref 31.5–36.0)
MCV: 91.6 fL (ref 79.5–101.0)
Monocytes Absolute: 0.6 10*3/uL (ref 0.1–0.9)
Monocytes Relative: 6 %
Neutro Abs: 9.1 10*3/uL — ABNORMAL HIGH (ref 1.5–6.5)
Neutrophils Relative %: 86 %
Platelet Count: 253 10*3/uL (ref 145–400)
RBC: 4.91 MIL/uL (ref 3.70–5.45)
RDW: 12.8 % (ref 11.2–14.5)
WBC Count: 10.6 10*3/uL — ABNORMAL HIGH (ref 3.9–10.3)

## 2017-11-27 LAB — CMP (CANCER CENTER ONLY)
ALT: 19 U/L (ref 0–55)
AST: 23 U/L (ref 5–34)
Albumin: 4.9 g/dL (ref 3.5–5.0)
Alkaline Phosphatase: 67 U/L (ref 40–150)
Anion gap: 9 (ref 3–11)
BUN: 19 mg/dL (ref 7–26)
CO2: 28 mmol/L (ref 22–29)
Calcium: 10.1 mg/dL (ref 8.4–10.4)
Chloride: 100 mmol/L (ref 98–109)
Creatinine: 0.86 mg/dL (ref 0.60–1.10)
GFR, Est AFR Am: >60 mL/min (ref >60)
GFR, Estimated: >60 mL/min (ref >60)
Glucose, Bld: 91 mg/dL (ref 70–140)
Potassium: 4.2 mmol/L (ref 3.5–5.1)
Sodium: 137 mmol/L (ref 136–145)
Total Bilirubin: 0.5 mg/dL (ref 0.2–1.2)
Total Protein: 7.5 g/dL (ref 6.4–8.3)

## 2017-11-27 MED ORDER — IOHEXOL 300 MG/ML  SOLN
75.0000 mL | Freq: Once | INTRAMUSCULAR | Status: AC | PRN
  Administered 2017-11-27: 75 mL via INTRAVENOUS

## 2017-11-28 ENCOUNTER — Other Ambulatory Visit: Payer: Medicare Other

## 2017-11-29 ENCOUNTER — Inpatient Hospital Stay: Payer: Medicare Other

## 2017-11-29 ENCOUNTER — Encounter: Payer: Self-pay | Admitting: Internal Medicine

## 2017-11-29 ENCOUNTER — Encounter: Payer: Self-pay | Admitting: *Deleted

## 2017-11-29 ENCOUNTER — Telehealth: Payer: Self-pay | Admitting: Internal Medicine

## 2017-11-29 ENCOUNTER — Inpatient Hospital Stay (HOSPITAL_BASED_OUTPATIENT_CLINIC_OR_DEPARTMENT_OTHER): Payer: Medicare Other | Admitting: Internal Medicine

## 2017-11-29 VITALS — BP 109/69 | HR 80 | Temp 97.6°F | Resp 18 | Ht 59.5 in | Wt 91.3 lb

## 2017-11-29 DIAGNOSIS — C3490 Malignant neoplasm of unspecified part of unspecified bronchus or lung: Secondary | ICD-10-CM

## 2017-11-29 DIAGNOSIS — C3491 Malignant neoplasm of unspecified part of right bronchus or lung: Secondary | ICD-10-CM

## 2017-11-29 DIAGNOSIS — C3411 Malignant neoplasm of upper lobe, right bronchus or lung: Secondary | ICD-10-CM | POA: Diagnosis not present

## 2017-11-29 DIAGNOSIS — Z902 Acquired absence of lung [part of]: Secondary | ICD-10-CM | POA: Diagnosis not present

## 2017-11-29 LAB — RESEARCH LABS

## 2017-11-29 NOTE — Telephone Encounter (Signed)
Appointments scheduled AVS/Calendar printed per 6/19 los °

## 2017-11-29 NOTE — Progress Notes (Signed)
[place] [phone]   [phone]  OFFICE PROGRESS NOTE  [name], [place] [address]  DIAGNOSIS: Stage IIIA (T2a, N2, M0) non-small cell lung cancer, adenocarcinoma with positive EGFR mutation with deletion in exon 19, presented with right upper lobe lung mass and mediastinal lymphadenopathy, status post surgical resection February 2017.  PRIOR THERAPY: 1) Right VATS with right upper lobectomy with bronchoplasty closure and en bloc resection of a wedge from the superior segment of the right lower lobe in addition to mediastinal lymph node dissection on 07/30/2015 under the care of Dr. Roxan Hockey. 2) Adjuvant systemic chemotherapy with cisplatin 75 MG/M2 and Alimta 500 MG/M2 every 3 weeks. First dose was given on 09/10/2015. Status post 4 cycles. 3) adjuvant radiotherapy under the care of Dr. Tammi Klippel.  CURRENT THERAPY: Observation.  INTERVAL HISTORY: [name] West 67 y.o. female returns to the clinic today for follow-up visit accompanied by her husband.  The patient is feeling fine today with no specific complaints.  Unfortunately she lost her mother 2 months ago.  She denied having any current chest pain, shortness breath, cough or hemoptysis.  She denied having any fever or chills.  She has no nausea, vomiting, diarrhea or constipation.  She has been in observation since June 2017.  She had repeat CT scan of the chest performed recently and she is here for evaluation and discussion of her scan results.  MEDICAL HISTORY: Past Medical History:  Diagnosis Date  . Anxiety   . Arthritis    shoulders  . Cancer ([place])    Lung Cancer  . Complication of anesthesia    nausea  . Cough 09/17/2015  . Encounter for antineoplastic chemotherapy 10/22/2015  . Environmental and seasonal allergies   . Family history of adverse reaction to anesthesia    Patients grandmother died while having hand surgery; pt unsure of cause  .  Oral thrush 10/22/2015  . Ovarian cyst   . PONV (postoperative nausea and vomiting)   . Raynaud disease     ALLERGIES:  is allergic to avelox [moxifloxacin hcl in nacl]; cephalosporins; clindamycin/lincomycin; penicillins; macrobid [nitrofurantoin monohyd macro]; omnipaque [iohexol]; ciprofloxacin; adhesive [tape]; doxycycline; lidoderm [lidocaine]; pseudoephedrine; and sulfa antibiotics.  MEDICATIONS:  Current Outpatient Medications  Medication Sig Dispense Refill  . acetaminophen (TYLENOL) 500 MG tablet Take 2 tablets (1,000 mg total) by mouth every 6 (six) hours as needed. (Patient not taking: Reported on 09/01/2016) 30 tablet 0  . Azelaic Acid (FINACEA) 15 % cream Apply 1 application topically 2 (two) times daily. After skin is thoroughly washed and patted dry, gently but thoroughly massage a thin film of azelaic acid cream into the affected area twice daily, in the morning and evening.    . Brimonidine Tartrate (MIRVASO) 0.33 % GEL Apply 1 application topically daily.    . cholecalciferol (VITAMIN D) 1000 UNITS tablet Take 1,000 Units by mouth daily.    . citalopram (CELEXA) 10 MG tablet Take 10 mg by mouth every morning.    Marland Kitchen EPINEPHrine 0.3 mg/0.3 mL IJ SOAJ injection Inject 0.3 mg into the muscle as needed (For allergic reaction).    . fexofenadine (ALLEGRA) 180 MG tablet Take 180 mg by mouth daily.    Marland Kitchen ibuprofen (ADVIL,MOTRIN) 200 MG tablet Take 200 mg by mouth every 6 (six) hours as needed for mild pain or moderate pain.    Marland Kitchen LORazepam (ATIVAN) 0.5 MG tablet Take 0.25 mg by mouth at bedtime as  needed for sleep. Reported on 10/07/2015    . Multiple Vitamin (MULTIVITAMIN WITH MINERALS) TABS Take 1 tablet by mouth daily.    . phenylephrine (SUDAFED PE) 10 MG TABS tablet Take 10 mg by mouth daily as needed.    . predniSONE (DELTASONE) 50 MG tablet Take one tablet ( 50 mg) 13 hours and 2 hours prior to scan. (Patient not taking: Reported on 06/20/2017) 2 tablet 1  . Wheat Dextrin (BENEFIBER)  POWD Take 1 Dose by mouth daily.    Marland Kitchen zolpidem (AMBIEN) 5 MG tablet Take 5 mg by mouth at bedtime as needed for sleep. Reported on 01/01/2016     No current facility-administered medications for this visit.     SURGICAL HISTORY:  Past Surgical History:  Procedure Laterality Date  . CESAREAN SECTION  1984  . COLONOSCOPY WITH PROPOFOL N/A 09/27/2012   Procedure: COLONOSCOPY WITH PROPOFOL;  Surgeon: Garlan Fair, MD;  Location: [facility];  Service: Endoscopy;  Laterality: N/A;  . Fibroid Removed    . LAPAROTOMY Left 07/20/2016   Procedure: MINI LAPAROTOMY, REMOVAL OF LEFT OVARIAN CYST, LEFT SALPINGO-OOPHORECTOMY;  Surgeon: Dian Queen, MD;  Location: [facility];  Service: Gynecology;  Laterality: Left;  please moved to 7:30am if spot opens  . Right Rotator Cuff Right   . VIDEO ASSISTED THORACOSCOPY (VATS)/ LOBECTOMY Right 07/30/2015   Procedure: RIGHT VIDEO ASSISTED THORACOSCOPY (VATS)/RIGHT UPPER LOBECTOMY;  Surgeon: [place] Nakayama, MD;  Location: [facility];  Service: Thoracic;  Laterality: Right;  Marland Kitchen VIDEO BRONCHOSCOPY WITH ENDOBRONCHIAL NAVIGATION N/A 07/15/2015   Procedure: VIDEO BRONCHOSCOPY WITH ENDOBRONCHIAL NAVIGATION with Biopsy;  Surgeon: Coll[place] Gobble, MD;  Location: [facility];  Service: Thoracic;  Laterality: N/A;    REVIEW OF SYSTEMS:  A comprehensive review of systems was negative.   PHYSICAL EXAMINATION: General appearance: alert, cooperative and no distress Head: Normocephalic, without obvious abnormality, atraumatic Neck: no adenopathy, no JVD, supple, symmetrical, trachea midline and thyroid not enlarged, symmetric, no tenderness/mass/nodules Lymph nodes: Cervical, supraclavicular, and axillary nodes normal. Resp: clear to auscultation bilaterally Back: symmetric, no curvature. ROM normal. No CVA tenderness. Cardio: regular rate and rhythm, S1, S2 normal, no murmur, click, rub or gallop GI: soft, non-tender; bowel sounds normal; no masses,  no organomegaly Extremities:  extremities normal, atraumatic, no cyanosis or edema  ECOG PERFORMANCE STATUS: 0 - Asymptomatic  Blood pressure 109/69, pulse 80, temperature 97.6 F (36.4 C), temperature source Oral, resp. rate 18, height 4' 11.5" (1.511 m), weight 91 lb 4.8 oz (41.4 kg), SpO2 100 %.  LABORATORY DATA: Lab Results  Component Value Date   WBC 10.6 (H) 11/27/2017   HGB 15.3 11/27/2017   HCT 45.0 11/27/2017   MCV 91.6 11/27/2017   PLT 253 11/27/2017      Chemistry      Component Value Date/Time   NA 137 11/27/2017 1051   NA 139 06/16/2017 1132   K 4.2 11/27/2017 1051   K 4.7 06/16/2017 1132   CL 100 11/27/2017 1051   CO2 28 11/27/2017 1051   CO2 28 06/16/2017 1132   BUN 19 11/27/2017 1051   BUN 19.5 06/16/2017 1132   CREATININE 0.86 11/27/2017 1051   CREATININE 0.8 06/16/2017 1132      Component Value Date/Time   CALCIUM 10.1 11/27/2017 1051   CALCIUM 10.0 06/16/2017 1132   ALKPHOS 67 11/27/2017 1051   ALKPHOS 68 06/16/2017 1132   AST 23 11/27/2017 1051   AST 21 06/16/2017 1132   ALT 19 11/27/2017 1051  ALT 18 06/16/2017 1132   BILITOT 0.5 11/27/2017 1051   BILITOT 0.50 06/16/2017 1132       RADIOGRAPHIC STUDIES: Ct Chest W Contrast  Result Date: 11/27/2017 CLINICAL DATA:  Stage IIIA non-small cell lung cancer. Right upper lobectomy with lymph node dissection 07/30/2015. Postsurgical chemotherapy. EXAM: CT CHEST WITH CONTRAST TECHNIQUE: Multidetector CT imaging of the chest was performed during intravenous contrast administration. CONTRAST:  51m OMNIPAQUE IOHEXOL 300 MG/ML  SOLN COMPARISON:  06/16/2017 FINDINGS: Cardiovascular: Aortic and branch vessel atherosclerosis. Tortuous thoracic aorta. Borderline cardiomegaly, without pericardial effusion. Lad coronary artery atherosclerosis. No central pulmonary embolism, on this non-dedicated study. Mediastinum/Nodes: No mediastinal or hilar adenopathy. Lungs/Pleura: No pleural fluid. Minimal right apical pleural thickening is similar.  Right upper lobectomy. Moderate centrilobular emphysema. Similar architectural distortion in the right suprahilar region. Right-sided pulmonary nodules are similar in size and distribution and identified on series 7. Example in the subpleural posterior right lower lobe 4 mm on image 60/7, more centrally in the posterior right lower lobe on image 67/7, and more superiorly in the subpleural right lower lobe at 4 mm on image 60/7. Tiny left sided pulmonary nodules are primarily subpleural in distribution and felt to be similar. Example in the left lower lobe at 4 mm anteriorly on image 81/7 and 3 mm posteriorly on image 88/7. Upper Abdomen: Normal imaged portions of the liver, spleen, pancreas, adrenal glands. Bilateral extrarenal pelves. Apparent proximal gastric wall thickening is favored to be due to underdistention. Normal imaged gallbladder. Large colonic stool burden. Musculoskeletal: Bilateral glenohumeral joint osteoarthritis. IMPRESSION: 1. Status post right upper lobectomy. No findings to suggest recurrent or metastatic disease. 2. Tiny bilateral pulmonary nodules, felt to be similar and most likely subpleural lymph nodes. Given the multiplicity of nodules, follow-up attention is warranted. 3. Aortic atherosclerosis (ICD10-I70.0), coronary artery atherosclerosis and emphysema (ICD10-J43.9). Electronically Signed   By: KAbigail MiyamotoM.D.   On: 11/27/2017 14:45    ASSESSMENT AND PLAN:  This is a very pleasant 67years old white female with stage IIIA non-small cell lung cancer, adenocarcinoma with positive EGFR mutation in exon 19 status post right upper lobectomy with lymph node dissection followed by adjuvant systemic chemotherapy as well as adjuvant radiation. The patient is currently on observation.  She is feeling fine with no concerning complaints. Repeat CT scan of the chest showed no concerning findings for recurrent or metastatic disease. I discussed the scan results with the patient and her  husband. I recommended for her to continue on observation with repeat CT scan of the chest in 6 months. She was advised to call immediately if she has any concerning symptoms in the interval. The patient voices understanding of current disease status and treatment options and is in agreement with the current care plan. All questions were answered. The patient knows to call the clinic with any problems, questions or concerns. We can certainly see the patient much sooner if necessary. I spent 10 minutes counseling the patient face to face. The total time spent in the appointment was 15 minutes.   Disclaimer: This note was dictated with voice recognition software. Similar sounding words can inadvertently be transcribed and may not be corrected upon review.

## 2017-11-29 NOTE — Progress Notes (Signed)
11/29/2017 See Consent Form Encounter for details regarding today's visit. Cindy S. Brigitte Pulse BSN, RN, [place] 11/29/2017 2:10 PM

## 2017-11-30 ENCOUNTER — Ambulatory Visit: Payer: Medicare Other | Admitting: Internal Medicine

## 2018-01-03 ENCOUNTER — Ambulatory Visit
Admission: RE | Admit: 2018-01-03 | Discharge: 2018-01-03 | Disposition: A | Discharge status: Home / Self Care | Payer: Medicare Other | Source: Ambulatory Visit | Attending: Family Medicine | Admitting: Family Medicine

## 2018-01-03 ENCOUNTER — Other Ambulatory Visit: Payer: Self-pay | Admitting: Internal Medicine

## 2018-01-03 ENCOUNTER — Telehealth: Payer: Self-pay | Admitting: *Deleted

## 2018-01-03 DIAGNOSIS — Z1231 Encounter for screening mammogram for malignant neoplasm of breast: Secondary | ICD-10-CM | POA: Diagnosis not present

## 2018-01-03 DIAGNOSIS — Z91041 Radiographic dye allergy status: Secondary | ICD-10-CM

## 2018-01-03 MED ORDER — PREDNISONE 50 MG PO TABS: ORAL_TABLET | ORAL | 1 refills | Status: DC

## 2018-01-03 NOTE — Telephone Encounter (Signed)
Oncology Nurse Navigator Documentation  Oncology Nurse Navigator Flowsheets 01/03/2018  Navigator Location [facility]  Navigator Encounter Type Telephone/I spoke with patient yesterday and she needed her pre-meds for upcoming scan. I updated Dr. Julien Nordmann and he e-scribed medication to pharmacy.  I called to update her. I was unable to reach her but I did leave a vm message with instruction. I asked that she call me with my name and phone number.   Telephone Outgoing Call  Treatment Phase Follow-up  Barriers/Navigation Needs Education;Coordination of Care  Education Other  Interventions Coordination of Care;Education  Coordination of Care Other  Education Method Verbal  Acuity Level 2  Time Spent with Patient 30

## 2018-03-02 DIAGNOSIS — M19012 Primary osteoarthritis, left shoulder: Secondary | ICD-10-CM | POA: Diagnosis not present

## 2018-03-06 DIAGNOSIS — L292 Pruritus vulvae: Secondary | ICD-10-CM | POA: Diagnosis not present

## 2018-03-13 DIAGNOSIS — Z23 Encounter for immunization: Secondary | ICD-10-CM | POA: Diagnosis not present

## 2018-03-15 ENCOUNTER — Telehealth: Payer: Self-pay | Admitting: *Deleted

## 2018-03-15 DIAGNOSIS — R590 Localized enlarged lymph nodes: Secondary | ICD-10-CM | POA: Diagnosis not present

## 2018-03-15 NOTE — Telephone Encounter (Signed)
Pt called w/concern " I have a bump right over where they took out my ovarian cysts. I saw my GYN the PA said it wasn't a GYN problem and to call GP or Onc office. Pt advised she has an appt 10/4 at 2pm with Dr. Addison Lank to discuss this concern. Pt denied pain to sight, redness, tenderness to touch, no n/v fever chills or night sweats.  Discussed with pt site could be scar tissue that has formed. Pt advised she will see her GP tomorrow and call back with any additional concerns. No further questions.

## 2018-03-16 DIAGNOSIS — I2510 Atherosclerotic heart disease of native coronary artery without angina pectoris: Secondary | ICD-10-CM | POA: Diagnosis not present

## 2018-03-16 DIAGNOSIS — R599 Enlarged lymph nodes, unspecified: Secondary | ICD-10-CM | POA: Diagnosis not present

## 2018-03-16 NOTE — Telephone Encounter (Signed)
"  Hiram Gash calling to speak with Stanton Kidney.  We spoke yesterday and I needed to follow up."  Aware nurse not in at this time.  "Will return call Monday of next week."

## 2018-03-23 DIAGNOSIS — R590 Localized enlarged lymph nodes: Secondary | ICD-10-CM | POA: Diagnosis not present

## 2018-03-27 ENCOUNTER — Encounter: Payer: Self-pay | Admitting: Internal Medicine

## 2018-03-27 ENCOUNTER — Ambulatory Visit (INDEPENDENT_AMBULATORY_CARE_PROVIDER_SITE_OTHER): Payer: Medicare Other | Admitting: Internal Medicine

## 2018-03-27 VITALS — BP 110/80 | HR 78 | Ht 59.5 in | Wt 91.6 lb

## 2018-03-27 DIAGNOSIS — I2510 Atherosclerotic heart disease of native coronary artery without angina pectoris: Secondary | ICD-10-CM | POA: Insufficient documentation

## 2018-03-27 DIAGNOSIS — I2584 Coronary atherosclerosis due to calcified coronary lesion: Secondary | ICD-10-CM | POA: Diagnosis not present

## 2018-03-27 DIAGNOSIS — R0602 Shortness of breath: Secondary | ICD-10-CM | POA: Insufficient documentation

## 2018-03-27 NOTE — Progress Notes (Signed)
OFFICE CONSULT NOTE  Chief Complaint:  Evaluate coronary artery calcification  Primary Care Physician: [name], MD  HPI:  [name] West is a 67 y.o. female who is being seen today for the evaluation of coronary artery calcification at the request of [name], MD.  This is a pleasant 67 year old female with unfortunate history of non-small cell lung cancer.  She underwent lobectomy by Dr. Roxan Hockey in 2017 with radiation chemotherapy.  Fortunately she seems to be cancer free with some recurrent scans.  Recently she is developed some inguinal lymphadenopathy.  She is on antibiotics for this however her is concerned about a neoplastic process.  Her most recent screening CT scan of the chest did demonstrate coronary artery calcification both in the LAD as well as aortic atherosclerosis which was calcified.  Family history is significant for a brother 110 years older who has coronary artery disease and has had an MI and CABG (however she notes he is live with HIV disease on HAART for more than 30 years).  She denies any history of diabetes or hypertension in fact does not take any daily medications.  Her last lipid profile was November 2018 which showed total cholesterol 210, HDL 81, triglycerides 65 and LDL 116.  She is asymptomatic from an anginal standpoint, but does report shortness of breath with exertion.  She felt like this may mostly be related to her lung cancer and the fact that she has had partial lung removal.  PMHx:  Past Medical History:  Diagnosis Date  . Anxiety   . Arthritis    shoulders  . Cancer ([place]) 06/2014   Lung Cancer  . Complication of anesthesia    nausea  . Cough 09/17/2015  . Encounter for antineoplastic chemotherapy 10/22/2015  . Environmental and seasonal allergies   . Family history of adverse reaction to anesthesia    Patients grandmother died while having hand surgery; pt unsure of cause  . Oral thrush 10/22/2015  . Ovarian cyst   . PONV  (postoperative nausea and vomiting)   . Raynaud disease     Past Surgical History:  Procedure Laterality Date  . BREAST EXCISIONAL BIOPSY Left    1996  . CESAREAN SECTION  1984  . COLONOSCOPY WITH PROPOFOL N/A 09/27/2012   Procedure: COLONOSCOPY WITH PROPOFOL;  Surgeon: Garlan Fair, MD;  [facility];  Service: Endoscopy;  Laterality: N/A;  . Fibroid Removed    . LAPAROTOMY Left 07/20/2016   Procedure: MINI LAPAROTOMY, REMOVAL OF LEFT OVARIAN CYST, LEFT SALPINGO-OOPHORECTOMY;  Surgeon: Dian Queen, MD;  Location: [facility];  Service: Gynecology;  Laterality: Left;  please moved to 7:30am if spot opens  . Right Rotator Cuff Right   . VIDEO ASSISTED THORACOSCOPY (VATS)/ LOBECTOMY Right 07/30/2015   Procedure: RIGHT VIDEO ASSISTED THORACOSCOPY (VATS)/RIGHT UPPER LOBECTOMY;  Surgeon: [place] Nakayama, MD;  Location: [facility];  Service: Thoracic;  Laterality: Right;  Marland Kitchen VIDEO BRONCHOSCOPY WITH ENDOBRONCHIAL NAVIGATION N/A 07/15/2015   Procedure: VIDEO BRONCHOSCOPY WITH ENDOBRONCHIAL NAVIGATION with Biopsy;  Surgeon: Collene Gobble, MD;  Location: [facility];  Service: Thoracic;  Laterality: N/A;    FAMHx:  Family History  Problem Relation Age of Onset  . Heart disease Brother   . Heart attack Brother     SOCHx:   reports that she has never smoked. She has never used smokeless tobacco. She reports that she drinks alcohol. She reports that she does not use drugs.  ALLERGIES:  Allergies  Allergen Reactions  .  Avelox [Moxifloxacin Hcl In Nacl] Anaphylaxis  . Cephalosporins Anaphylaxis  . Clindamycin/Lincomycin Anaphylaxis  . Penicillins Anaphylaxis    Has patient had a PCN reaction causing immediate rash, facial/tongue/throat swelling, SOB or lightheadedness with hypotension: Yes Has patient had a PCN reaction causing severe rash involving mucus membranes or skin necrosis: No Has patient had a PCN reaction that required hospitalization Yes Has patient had a PCN reaction occurring  within the last 10 years: No If all of the above answers are "NO", then may proceed with Cephalosporin use.   Marland Kitchen Macrobid [Nitrofurantoin Monohyd Macro] Other (See Comments)    CHEST TIGHTNESS...Marland KitchenHAD USED BEFORE WITH NO REACTION  . Omnipaque [Iohexol] Hives  . Ciprofloxacin   . Adhesive [Tape] Rash  . Doxycycline Other (See Comments)    Frequently urination   . Lidoderm [Lidocaine] Rash  . Pseudoephedrine Palpitations  . Sulfa Antibiotics Rash    ROS: Pertinent items noted in HPI and remainder of comprehensive ROS otherwise negative.  HOME MEDS: Current Outpatient Medications on File Prior to Visit  Medication Sig Dispense Refill  . Azelaic Acid (FINACEA) 15 % cream Apply 1 application topically 2 (two) times daily. After skin is thoroughly washed and patted dry, gently but thoroughly massage a thin film of azelaic acid cream into the affected area twice daily, in the morning and evening.    . Brimonidine Tartrate (MIRVASO) 0.33 % GEL Apply 1 application topically daily.    . cholecalciferol (VITAMIN D) 1000 UNITS tablet Take 1,000 Units by mouth daily.    . fexofenadine (ALLEGRA) 180 MG tablet Take 180 mg by mouth daily.    . Multiple Vitamin (MULTIVITAMIN WITH MINERALS) TABS Take 1 tablet by mouth daily.    Marland Kitchen omeprazole (PRILOSEC) 20 MG capsule Take 20 mg by mouth daily.    . phenylephrine (SUDAFED PE) 10 MG TABS tablet Take 10 mg by mouth daily as needed.    . Wheat Dextrin (BENEFIBER) POWD Take 1 Dose by mouth daily.    Marland Kitchen acetaminophen (TYLENOL) 500 MG tablet Take 2 tablets (1,000 mg total) by mouth every 6 (six) hours as needed. (Patient not taking: Reported on 09/01/2016) 30 tablet 0  . EPINEPHrine 0.3 mg/0.3 mL IJ SOAJ injection Inject 0.3 mg into the muscle as needed (For allergic reaction).    Marland Kitchen ibuprofen (ADVIL,MOTRIN) 200 MG tablet Take 200 mg by mouth every 6 (six) hours as needed for mild pain or moderate pain.    Marland Kitchen LORazepam (ATIVAN) 0.5 MG tablet Take 0.25 mg by mouth at  bedtime as needed for sleep. Reported on 10/07/2015    . zolpidem (AMBIEN) 5 MG tablet Take 5 mg by mouth at bedtime as needed for sleep. Reported on 01/01/2016     No current facility-administered medications on file prior to visit.     LABS/IMAGING: No results found for this or any previous visit (from the past 48 hour(s)). No results found.  LIPID PANEL: No results found for: CHOL, TRIG, HDL, CHOLHDL, VLDL, LDLCALC, LDLDIRECT  WEIGHTS: Wt Readings from Last 3 Encounters:  03/27/18 91 lb 9.6 oz (41.5 kg)  11/29/17 91 lb 4.8 oz (41.4 kg)  06/20/17 92 lb (41.7 kg)    VITALS: BP 110/80   Pulse 78   Ht 4' 11.5" (1.511 m)   Wt 91 lb 9.6 oz (41.5 kg)   BMI 18.19 kg/m   EXAM: General appearance: alert and no distress Neck: no carotid bruit, no JVD and thyroid not enlarged, symmetric, no tenderness/mass/nodules Lungs: clear to  auscultation bilaterally Heart: regular rate and rhythm, S1, S2 normal, no murmur, click, rub or gallop Abdomen: soft, non-tender; bowel sounds normal; no masses,  no organomegaly Extremities: extremities normal, atraumatic, no cyanosis or edema Pulses: 2+ and symmetric Skin: Skin color, texture, turgor normal. No rashes or lesions Neurologic: Grossly normal Psych: Pleasant  EKG: Normal sinus rhythm 78- personally reviewed  ASSESSMENT: 1. Coronary artery calcification-LAD and aortic atherosclerosis 2. 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 Family history of coronary artery disease  PLAN: 1.   Ms. Bickhart has location of the LAD as well as aortic atherosclerosis and calcification suggestive of coronary artery disease.  She seems to have few risk factors.  She is not diabetic and does not have a history of hypertension.  Her LDL cholesterol is above goal 70 now that we are aware of her coronary disease, but previously her only main issue was lung cancer.  There is family history of heart disease in her brother however he  does have long-standing HIV disease.  Nonetheless, she will need aggressive lipid management.  I would likely recommend starting her on statin therapy but will repeat a fasting lipid profile to help determine the degree of improvement that we need.  Finally, I would recommend work-up of her dyspnea.  Although this may be related to her lung cancer, she has had some progressive symptoms and should be more evaluated.  She is hesitant to do nuclear stress testing or repeat CT scans given the fact that she has had so much radiation and prior cancer.  I somewhat agree with this and would therefore recommend pursuing a stress echocardiogram.  Although this is less sensitive, she is not describing significant anginal symptoms at this time and would be satisfying if it were low risk.  Plan follow-up with me afterwards.  Thanks again for the kind referral.  Pixie Casino, MD, FACC, [place] Director of the [facility]  [facility] Diplomate of the American Board of Clinical Lipidology Attending Cardiologist  Direct Dial: [phone]  Fax: [phone]  Website:  [email or web address] 03/27/2018, 3:17 PM

## 2018-03-27 NOTE — Patient Instructions (Addendum)
Medication Instructions:  Continue current medications If you need a refill on your cardiac medications before your next appointment, please call your pharmacy.    Lab work: FASTING lab work to check cholesterol  If you have labs (blood work) drawn today and your tests are completely normal, you will receive your results only by: Marland Kitchen MyChart Message (if you have MyChart) OR . A paper copy in the mail If you have any lab test that is abnormal or we need to change your treatment, we will call you to review the results.  Testing/Procedures: Your physician has requested that you have a stress echocardiogram. This is done @ [address] - [address] Floor    Follow-Up: At [facility], you and your health needs are our priority.  As part of our continuing mission to provide you with exceptional heart care, we have created designated Provider Care Teams.  These Care Teams include your primary Cardiologist (physician) and Advanced Practice Providers (APPs -  Physician Assistants and Nurse Practitioners) who all work together to provide you with the care you need, when you need it. You will need a follow up appointment after your stress echo. You will see Dr. Debara Pickett or one of the following Advanced Practice Providers on your designated Care Team: Almyra Deforest, [place] . Fabian Sharp, PA-C  Any Other Special Instructions Will Be Listed Below (If Applicable).

## 2018-03-28 DIAGNOSIS — R0602 Shortness of breath: Secondary | ICD-10-CM | POA: Diagnosis not present

## 2018-03-28 DIAGNOSIS — I2510 Atherosclerotic heart disease of native coronary artery without angina pectoris: Secondary | ICD-10-CM | POA: Diagnosis not present

## 2018-03-28 DIAGNOSIS — I2584 Coronary atherosclerosis due to calcified coronary lesion: Secondary | ICD-10-CM | POA: Diagnosis not present

## 2018-03-29 LAB — LIPOPROTEIN A (LPA): Lipoprotein (a): 21 nmol/L (ref <75.0)

## 2018-03-29 LAB — NMR, LIPOPROFILE
Cholesterol, Total: 219 mg/dL — ABNORMAL HIGH (ref 100–199)
HDL Particle Number: 40.3 umol/L (ref >=30.5)
HDL-C: 92 mg/dL (ref >39)
LDL Particle Number: 1151 nmol/L — ABNORMAL HIGH (ref <1000)
LDL Size: 21.2 nm (ref >20.5)
LDL-C: 110 mg/dL — ABNORMAL HIGH (ref 0–99)
LP-IR Score: <25 (ref <=45)
Small LDL Particle Number: <90 nmol/L (ref <=527)
Triglycerides: 85 mg/dL (ref 0–149)

## 2018-04-04 ENCOUNTER — Telehealth (HOSPITAL_COMMUNITY): Payer: Self-pay | Admitting: *Deleted

## 2018-04-04 NOTE — Telephone Encounter (Signed)
Patient given detailed instructions per Stress Test Requisition Sheet for test on 04/09/18 at 2:00.Patient Notified to arrive 30 minutes early, and that it is imperative to arrive on time for appointment to keep from having the test rescheduled.  Patient verbalized understanding. [name] West

## 2018-04-05 DIAGNOSIS — S39012A Strain of muscle, fascia and tendon of lower back, initial encounter: Secondary | ICD-10-CM | POA: Diagnosis not present

## 2018-04-05 DIAGNOSIS — R590 Localized enlarged lymph nodes: Secondary | ICD-10-CM | POA: Diagnosis not present

## 2018-04-09 ENCOUNTER — Other Ambulatory Visit: Payer: Self-pay

## 2018-04-09 ENCOUNTER — Ambulatory Visit (HOSPITAL_COMMUNITY): Payer: Medicare Other | Attending: Cardiology

## 2018-04-09 ENCOUNTER — Ambulatory Visit (HOSPITAL_COMMUNITY): Payer: Medicare Other

## 2018-04-09 DIAGNOSIS — I2510 Atherosclerotic heart disease of native coronary artery without angina pectoris: Secondary | ICD-10-CM | POA: Diagnosis not present

## 2018-04-09 DIAGNOSIS — I2584 Coronary atherosclerosis due to calcified coronary lesion: Secondary | ICD-10-CM | POA: Diagnosis not present

## 2018-04-09 DIAGNOSIS — R0602 Shortness of breath: Secondary | ICD-10-CM | POA: Insufficient documentation

## 2018-04-09 MED ORDER — PERFLUTREN LIPID MICROSPHERE
1.0000 mL | INTRAVENOUS | Status: AC | PRN
  Administered 2018-04-09: 2 mL via INTRAVENOUS
  Administered 2018-04-09 (×2): 1 mL via INTRAVENOUS

## 2018-04-24 ENCOUNTER — Ambulatory Visit (INDEPENDENT_AMBULATORY_CARE_PROVIDER_SITE_OTHER): Payer: Medicare Other | Admitting: Internal Medicine

## 2018-04-24 ENCOUNTER — Encounter: Payer: Self-pay | Admitting: Internal Medicine

## 2018-04-24 VITALS — BP 113/77 | HR 80 | Ht 59.5 in | Wt 92.0 lb

## 2018-04-24 DIAGNOSIS — I2584 Coronary atherosclerosis due to calcified coronary lesion: Secondary | ICD-10-CM | POA: Diagnosis not present

## 2018-04-24 DIAGNOSIS — I2510 Atherosclerotic heart disease of native coronary artery without angina pectoris: Secondary | ICD-10-CM | POA: Diagnosis not present

## 2018-04-24 DIAGNOSIS — E782 Mixed hyperlipidemia: Secondary | ICD-10-CM | POA: Diagnosis not present

## 2018-04-24 DIAGNOSIS — R0602 Shortness of breath: Secondary | ICD-10-CM

## 2018-04-24 NOTE — Patient Instructions (Signed)
Medication Instructions:  Continue current medications If you need a refill on your cardiac medications before your next appointment, please call your pharmacy.   Follow-Up: At [facility], you and your health needs are our priority.  As part of our continuing mission to provide you with exceptional heart care, we have created designated Provider Care Teams.  These Care Teams include your primary Cardiologist (physician) and Advanced Practice Providers (APPs -  Physician Assistants and Nurse Practitioners) who all work together to provide you with the care you need, when you need it. You will need a follow up appointment in 6 months.  Please call our office 2 months in advance to schedule this appointment.  You may see Dr. Debara Pickett or one of the following Advanced Practice Providers on your designated Care Team: Almyra Deforest, [place] . Fabian Sharp, PA-C  Any Other Special Instructions Will Be Listed Below (If Applicable).

## 2018-04-24 NOTE — Progress Notes (Signed)
OFFICE CONSULT NOTE  Chief Complaint:  Follow-up stress echocardiogram  Primary Care Physician: [name], MD  HPI:  [name] West is a 67 y.o. female who is being seen today for the evaluation of coronary artery calcification at the request of [name], MD.  This is a pleasant 67 year old female with unfortunate history of non-small cell lung cancer.  She underwent lobectomy by Dr. Roxan Hockey in 2017 with radiation chemotherapy.  Fortunately she seems to be cancer free with some recurrent scans.  Recently she is developed some inguinal lymphadenopathy.  She is on antibiotics for this however her is concerned about a neoplastic process.  Her most recent screening CT scan of the chest did demonstrate coronary artery calcification both in the LAD as well as aortic atherosclerosis which was calcified.  Family history is significant for a brother 47 years older who has coronary artery disease and has had an MI and CABG (however she notes he is live with HIV disease on HAART for more than 30 years).  She denies any history of diabetes or hypertension in fact does not take any daily medications.  Her last lipid profile was November 2018 which showed total cholesterol 210, HDL 81, triglycerides 65 and LDL 116.  She is asymptomatic from an anginal standpoint, but does report shortness of breath with exertion.  She felt like this may mostly be related to her lung cancer and the fact that she has had partial lung removal.  12/22/2017  Ms. Melena returns today for follow-up of her stress echocardiogram.  Fortunately this was a negative study without any exercise-induced wall motion abnormalities and normal LV function.  Overall she feels well except for ongoing shoulder problems.  She is scheduled for sold shoulder surgery in January.  She is supposed to have another CT scan of her chest in December.  We discussed the fact that she does have coronary disease as evidenced by her coronary artery  calcifications.  Although her stress test was low risk, her goal LDL is less than 70.  Currently her LDL is 116.  Given her body habitus, thin weight and fairly healthy diet, is unlikely she is going get a significant increased improvement in her numbers.  I did repeat a lipid profile which was an NMR.  Her LDL-C is 110, and her LDL-P is 1151.  Total cholesterol is 219 and LPA was negative at 21.   PMHx:  Past Medical History:  Diagnosis Date  . Anxiety   . Arthritis    shoulders  . Cancer ([place]) 06/2014   Lung Cancer  . Complication of anesthesia    nausea  . Cough 09/17/2015  . Encounter for antineoplastic chemotherapy 10/22/2015  . Environmental and seasonal allergies   . Family history of adverse reaction to anesthesia    Patients grandmother died while having hand surgery; pt unsure of cause  . Oral thrush 10/22/2015  . Ovarian cyst   . PONV (postoperative nausea and vomiting)   . Raynaud disease     Past Surgical History:  Procedure Laterality Date  . BREAST EXCISIONAL BIOPSY Left    1996  . CESAREAN SECTION  1984  . COLONOSCOPY WITH PROPOFOL N/A 09/27/2012   Procedure: COLONOSCOPY WITH PROPOFOL;  Surgeon: Garlan Fair, MD;  Location: [facility];  Service: Endoscopy;  Laterality: N/A;  . Fibroid Removed    . LAPAROTOMY Left 07/20/2016   Procedure: MINI LAPAROTOMY, REMOVAL OF LEFT OVARIAN CYST, LEFT SALPINGO-OOPHORECTOMY;  Surgeon: Dian Queen, MD;  Location:  [facility];  Service: Gynecology;  Laterality: Left;  please moved to 7:30am if spot opens  . Right Rotator Cuff Right   . VIDEO ASSISTED THORACOSCOPY (VATS)/ LOBECTOMY Right 07/30/2015   Procedure: RIGHT VIDEO ASSISTED THORACOSCOPY (VATS)/RIGHT UPPER LOBECTOMY;  Surgeon: Melrose Nakayama, MD;  Location: [facility];  Service: Thoracic;  Laterality: Right;  Marland Kitchen VIDEO BRONCHOSCOPY WITH ENDOBRONCHIAL NAVIGATION N/A 07/15/2015   Procedure: VIDEO BRONCHOSCOPY WITH ENDOBRONCHIAL NAVIGATION with Biopsy;  Surgeon: Collene Gobble, MD;   Location: [facility];  Service: Thoracic;  Laterality: N/A;    FAMHx:  Family History  Problem Relation Age of Onset  . Heart disease Brother   . Heart attack Brother     SOCHx:   reports that she has never smoked. She has never used smokeless tobacco. She reports that she drinks alcohol. She reports that she does not use drugs.  ALLERGIES:  Allergies  Allergen Reactions  . Avelox [Moxifloxacin Hcl In Nacl] Anaphylaxis  . Cephalosporins Anaphylaxis  . Clindamycin/Lincomycin Anaphylaxis  . Penicillins Anaphylaxis    Has patient had a PCN reaction causing immediate rash, facial/tongue/throat swelling, SOB or lightheadedness with hypotension: Yes Has patient had a PCN reaction causing severe rash involving mucus membranes or skin necrosis: No Has patient had a PCN reaction that required hospitalization Yes Has patient had a PCN reaction occurring within the last 10 years: No If all of the above answers are "NO", then may proceed with Cephalosporin use.   Marland Kitchen Macrobid [Nitrofurantoin Monohyd Macro] Other (See Comments)    CHEST TIGHTNESS...Marland KitchenHAD USED BEFORE WITH NO REACTION  . Omnipaque [Iohexol] Hives  . Ciprofloxacin   . Adhesive [Tape] Rash  . Doxycycline Other (See Comments)    Frequently urination   . Lidoderm [Lidocaine] Rash  . Pseudoephedrine Palpitations  . Sulfa Antibiotics Rash    ROS: Pertinent items noted in HPI and remainder of comprehensive ROS otherwise negative.  HOME MEDS: Current Outpatient Medications on File Prior to Visit  Medication Sig Dispense Refill  . acetaminophen (TYLENOL) 500 MG tablet Take 2 tablets (1,000 mg total) by mouth every 6 (six) hours as needed. 30 tablet 0  . Azelaic Acid (FINACEA) 15 % cream Apply 1 application topically 2 (two) times daily. After skin is thoroughly washed and patted dry, gently but thoroughly massage a thin film of azelaic acid cream into the affected area twice daily, in the morning and evening.    . Brimonidine Tartrate  (MIRVASO) 0.33 % GEL Apply 1 application topically daily.    . cholecalciferol (VITAMIN D) 1000 UNITS tablet Take 1,000 Units by mouth daily.    Marland Kitchen EPINEPHrine 0.3 mg/0.3 mL IJ SOAJ injection Inject 0.3 mg into the muscle as needed (For allergic reaction).    . fexofenadine (ALLEGRA) 180 MG tablet Take 180 mg by mouth daily.    Marland Kitchen ibuprofen (ADVIL,MOTRIN) 200 MG tablet Take 200 mg by mouth every 6 (six) hours as needed for mild pain or moderate pain.    Marland Kitchen LORazepam (ATIVAN) 0.5 MG tablet Take 0.25 mg by mouth at bedtime as needed for sleep. Reported on 10/07/2015    . Multiple Vitamin (MULTIVITAMIN WITH MINERALS) TABS Take 1 tablet by mouth daily.    . phenylephrine (SUDAFED PE) 10 MG TABS tablet Take 10 mg by mouth daily as needed.    . Wheat Dextrin (BENEFIBER) POWD Take 1 Dose by mouth daily.     No current facility-administered medications on file prior to visit.     LABS/IMAGING: No results  found for this or any previous visit (from the past 48 hour(s)). No results found.  LIPID PANEL: No results found for: CHOL, TRIG, HDL, CHOLHDL, VLDL, LDLCALC, LDLDIRECT  WEIGHTS: Wt Readings from Last 3 Encounters:  04/24/18 92 lb (41.7 kg)  03/27/18 91 lb 9.6 oz (41.5 kg)  11/29/17 91 lb 4.8 oz (41.4 kg)    VITALS: BP 113/77   Pulse 80   Ht 4' 11.5" (1.511 m)   Wt 92 lb (41.7 kg)   SpO2 99%   BMI 18.27 kg/m   EXAM: Deferred  EKG: Deferred  ASSESSMENT: 1. Coronary artery calcification-LAD and aortic atherosclerosis (negative stress echocardiogram (03/2018)) 2. 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 Family history of coronary artery disease 4. Dyslipidemia-goal LDL less than 70  PLAN: 1.   Ms. Much has known coronary artery disease by virtue of coronary artery calcification.  Her LDL particle numbers and LDL-C is still greater than target.  She would benefit from statin therapy to reduce cardiovascular risk and I would likely recommend either  atorvastatin 40 mg or rosuvastatin 20 mg daily.  At this point she is hesitant to take statin due to concern about side effects and the fact that she has problems with her shoulder including pain and wants to wait 6 months until after she has shoulder surgery and has recovered to reevaluate it.  This is reasonable.  We will plan to see her back in 6 months or sooner as necessary.  Pixie Casino, MD, [facility], [place] Director of the Advanced Lipid Disorders &  Cardiovascular Risk Reduction Clinic Diplomate of the American Board of Clinical Lipidology Attending Cardiologist  Direct Dial: [phone]  Fax: [phone]  Website:  [email or web address] 04/24/2018, 10:01 AM

## 2018-05-15 ENCOUNTER — Encounter: Payer: Self-pay | Admitting: Internal Medicine

## 2018-05-15 DIAGNOSIS — L719 Rosacea, unspecified: Secondary | ICD-10-CM | POA: Diagnosis not present

## 2018-05-15 DIAGNOSIS — K5900 Constipation, unspecified: Secondary | ICD-10-CM | POA: Diagnosis not present

## 2018-05-15 DIAGNOSIS — N959 Unspecified menopausal and perimenopausal disorder: Secondary | ICD-10-CM | POA: Diagnosis not present

## 2018-05-15 DIAGNOSIS — M8580 Other specified disorders of bone density and structure, unspecified site: Secondary | ICD-10-CM | POA: Diagnosis not present

## 2018-05-15 DIAGNOSIS — J309 Allergic rhinitis, unspecified: Secondary | ICD-10-CM | POA: Diagnosis not present

## 2018-05-15 DIAGNOSIS — C3491 Malignant neoplasm of unspecified part of right bronchus or lung: Secondary | ICD-10-CM | POA: Diagnosis not present

## 2018-05-15 DIAGNOSIS — R590 Localized enlarged lymph nodes: Secondary | ICD-10-CM | POA: Diagnosis not present

## 2018-05-15 DIAGNOSIS — E559 Vitamin D deficiency, unspecified: Secondary | ICD-10-CM | POA: Diagnosis not present

## 2018-05-15 DIAGNOSIS — Z0000 Encounter for general adult medical examination without abnormal findings: Secondary | ICD-10-CM | POA: Diagnosis not present

## 2018-05-15 DIAGNOSIS — Z1211 Encounter for screening for malignant neoplasm of colon: Secondary | ICD-10-CM | POA: Diagnosis not present

## 2018-05-15 DIAGNOSIS — Z91038 Other insect allergy status: Secondary | ICD-10-CM | POA: Diagnosis not present

## 2018-05-15 DIAGNOSIS — F411 Generalized anxiety disorder: Secondary | ICD-10-CM | POA: Diagnosis not present

## 2018-05-16 ENCOUNTER — Encounter: Payer: Self-pay | Admitting: Internal Medicine

## 2018-05-17 ENCOUNTER — Telehealth: Payer: Self-pay | Admitting: *Deleted

## 2018-05-17 ENCOUNTER — Other Ambulatory Visit: Payer: Self-pay | Admitting: *Deleted

## 2018-05-17 DIAGNOSIS — C3490 Malignant neoplasm of unspecified part of unspecified bronchus or lung: Secondary | ICD-10-CM

## 2018-05-17 NOTE — Telephone Encounter (Signed)
Pt called states " Dr. Addison Lank would like Dr. Julien Nordmann to order scan  Left Groin so this could be done on 12/17 when she has the Chest CT. Placed a call to Dr. Baldomero Lamy office regarding left groin imaging. Request MD's nurse call back with name of imaging order as MD will be glad to order it.

## 2018-05-17 NOTE — Progress Notes (Signed)
CT Abdomen Pelvis order entered. Pt and Dr. Addison Lank notified.

## 2018-05-18 ENCOUNTER — Telehealth: Payer: Self-pay | Admitting: *Deleted

## 2018-05-18 NOTE — Telephone Encounter (Signed)
Oncology Nurse Navigator Documentation  Oncology Nurse Navigator Flowsheets 05/18/2018  Navigator Location [facility]  Navigator Encounter Type Telephone/[name] West called with questions about prep for radiology scans.  I called her to clarify after getting an update from radiology dept.  She verbalized understanding of instructions.   Telephone Outgoing Call  Treatment Phase Follow-up  Barriers/Navigation Needs Education  Education Other  Interventions Education  Education Method Verbal  Acuity Level 1  Time Spent with Patient 15

## 2018-05-22 ENCOUNTER — Other Ambulatory Visit: Payer: Self-pay | Admitting: Family Medicine

## 2018-05-22 DIAGNOSIS — M8580 Other specified disorders of bone density and structure, unspecified site: Secondary | ICD-10-CM

## 2018-05-26 ENCOUNTER — Other Ambulatory Visit: Payer: Self-pay | Admitting: Internal Medicine

## 2018-05-26 DIAGNOSIS — C3490 Malignant neoplasm of unspecified part of unspecified bronchus or lung: Secondary | ICD-10-CM

## 2018-05-29 ENCOUNTER — Ambulatory Visit (HOSPITAL_COMMUNITY)
Admission: RE | Admit: 2018-05-29 | Discharge: 2018-05-29 | Disposition: A | Discharge status: Home / Self Care | Payer: Medicare Other | Source: Ambulatory Visit | Attending: Internal Medicine | Admitting: Internal Medicine

## 2018-05-29 ENCOUNTER — Ambulatory Visit (HOSPITAL_COMMUNITY): Payer: Medicare Other

## 2018-05-29 ENCOUNTER — Inpatient Hospital Stay: Payer: Medicare Other | Attending: Internal Medicine

## 2018-05-29 DIAGNOSIS — C3490 Malignant neoplasm of unspecified part of unspecified bronchus or lung: Secondary | ICD-10-CM | POA: Insufficient documentation

## 2018-05-29 DIAGNOSIS — C3411 Malignant neoplasm of upper lobe, right bronchus or lung: Secondary | ICD-10-CM | POA: Diagnosis not present

## 2018-05-29 DIAGNOSIS — R911 Solitary pulmonary nodule: Secondary | ICD-10-CM | POA: Diagnosis not present

## 2018-05-29 DIAGNOSIS — Z85118 Personal history of other malignant neoplasm of bronchus and lung: Secondary | ICD-10-CM | POA: Insufficient documentation

## 2018-05-29 DIAGNOSIS — R918 Other nonspecific abnormal finding of lung field: Secondary | ICD-10-CM | POA: Diagnosis not present

## 2018-05-29 DIAGNOSIS — I700 Atherosclerosis of aorta: Secondary | ICD-10-CM | POA: Diagnosis not present

## 2018-05-29 LAB — CMP (CANCER CENTER ONLY)
ALT: 21 U/L (ref 0–44)
AST: 22 U/L (ref 15–41)
Albumin: 4.7 g/dL (ref 3.5–5.0)
Alkaline Phosphatase: 72 U/L (ref 38–126)
Anion gap: 12 (ref 5–15)
BUN: 21 mg/dL (ref 8–23)
CO2: 28 mmol/L (ref 22–32)
Calcium: 10.3 mg/dL (ref 8.9–10.3)
Chloride: 100 mmol/L (ref 98–111)
Creatinine: 0.88 mg/dL (ref 0.44–1.00)
GFR, Est AFR Am: >60 mL/min (ref >60)
GFR, Estimated: >60 mL/min (ref >60)
Glucose, Bld: 150 mg/dL — ABNORMAL HIGH (ref 70–99)
Potassium: 4.2 mmol/L (ref 3.5–5.1)
Sodium: 140 mmol/L (ref 135–145)
Total Bilirubin: 0.4 mg/dL (ref 0.3–1.2)
Total Protein: 7.9 g/dL (ref 6.5–8.1)

## 2018-05-29 LAB — CBC WITH DIFFERENTIAL (CANCER CENTER ONLY)
Abs Immature Granulocytes: 0.03 10*3/uL (ref 0.00–0.07)
Basophils Absolute: 0 10*3/uL (ref 0.0–0.1)
Basophils Relative: 0 %
Eosinophils Absolute: 0 10*3/uL (ref 0.0–0.5)
Eosinophils Relative: 0 %
HCT: 47 % — ABNORMAL HIGH (ref 36.0–46.0)
Hemoglobin: 16.1 g/dL — ABNORMAL HIGH (ref 12.0–15.0)
Immature Granulocytes: 0 %
Lymphocytes Relative: 7 %
Lymphs Abs: 0.6 10*3/uL — ABNORMAL LOW (ref 0.7–4.0)
MCH: 31.3 pg (ref 26.0–34.0)
MCHC: 34.3 g/dL (ref 30.0–36.0)
MCV: 91.3 fL (ref 80.0–100.0)
Monocytes Absolute: 0.1 10*3/uL (ref 0.1–1.0)
Monocytes Relative: 2 %
Neutro Abs: 7.8 10*3/uL — ABNORMAL HIGH (ref 1.7–7.7)
Neutrophils Relative %: 91 %
Platelet Count: 257 10*3/uL (ref 150–400)
RBC: 5.15 MIL/uL — ABNORMAL HIGH (ref 3.87–5.11)
RDW: 12.4 % (ref 11.5–15.5)
WBC Count: 8.5 10*3/uL (ref 4.0–10.5)
nRBC: 0 % (ref 0.0–0.2)

## 2018-05-29 MED ORDER — IOHEXOL 300 MG/ML  SOLN
100.0000 mL | Freq: Once | INTRAMUSCULAR | Status: AC | PRN
  Administered 2018-05-29: 100 mL via INTRAVENOUS

## 2018-05-29 MED ORDER — SODIUM CHLORIDE (PF) 0.9 % IJ SOLN
INTRAMUSCULAR | Status: AC
  Filled 2018-05-29: qty 50

## 2018-05-30 ENCOUNTER — Other Ambulatory Visit: Payer: Medicare Other

## 2018-05-30 NOTE — Patient Instructions (Signed)
[name] West  05/30/2018   Your procedure is scheduled on: 06-14-2018   Report to [facility] Main  Entrance    Report to admitting at 5:30AM    Call this number if you have problems the morning of surgery [phone]     Remember: Do not eat food or drink liquids :After Midnight. BRUSH YOUR TEETH MORNING OF SURGERY AND RINSE YOUR MOUTH OUT, NO CHEWING GUM CANDY OR MINTS.     Take these medicines the morning of surgery with A SIP OF WATER: CITALOPRAM, ALLEGRA                                 You may not have any metal on your body including hair pins and              piercings  Do not wear jewelry, make-up, lotions, powders or perfumes, deodorant             Do not wear nail polish.  Do not shave  48 hours prior to surgery.        Do not bring valuables to the hospital. [place].  Contacts, dentures or bridgework may not be worn into surgery.  Leave suitcase in the car. After surgery it may be brought to your room.                  Please read over the following fact sheets you were given: _____________________________________________________________________             [facility] - Preparing for Surgery Before surgery, you can play an important role.  Because skin is not sterile, your skin needs to be as free of germs as possible.  You can reduce the number of germs on your skin by washing with CHG (chlorahexidine gluconate) soap before surgery.  CHG is an antiseptic cleaner which kills germs and bonds with the skin to continue killing germs even after washing. Please DO NOT use if you have an allergy to CHG or antibacterial soaps.  If your skin becomes reddened/irritated stop using the CHG and inform your nurse when you arrive at Short Stay. Do not shave (including legs and underarms) for at least 48 hours prior to the first CHG shower.  You may shave your face/neck. Please follow these instructions  carefully:  1.  Shower with CHG Soap the night before surgery and the  morning of Surgery.  2.  If you choose to wash your hair, wash your hair first as usual with your  normal  shampoo.  3.  After you shampoo, rinse your hair and body thoroughly to remove the  shampoo.                           4.  Use CHG as you would any other liquid soap.  You can apply chg directly  to the skin and wash                       Gently with a scrungie or clean washcloth.  5.  Apply the CHG Soap to your body ONLY FROM THE NECK DOWN.   Do not use on face/ open  Wound or open sores. Avoid contact with eyes, ears mouth and genitals (private parts).                       Wash face,  Genitals (private parts) with your normal soap.             6.  Wash thoroughly, paying special attention to the area where your surgery  will be performed.  7.  Thoroughly rinse your body with warm water from the neck down.  8.  DO NOT shower/wash with your normal soap after using and rinsing off  the CHG Soap.                9.  Pat yourself dry with a clean towel.            10.  Wear clean pajamas.            11.  Place clean sheets on your bed the night of your first shower and do not  sleep with pets. Day of Surgery : Do not apply any lotions/deodorants the morning of surgery.  Please wear clean clothes to the hospital/surgery center.  FAILURE TO FOLLOW THESE INSTRUCTIONS MAY RESULT IN THE CANCELLATION OF YOUR SURGERY PATIENT SIGNATURE_________________________________  NURSE SIGNATURE__________________________________  ________________________________________________________________________   [name] West  An incentive spirometer is a tool that can help keep your lungs clear and active. This tool measures how well you are filling your lungs with each breath. Taking long deep breaths may help reverse or decrease the chance of developing breathing (pulmonary) problems (especially infection)  following:  A long period of time when you are unable to move or be active. BEFORE THE PROCEDURE   If the spirometer includes an indicator to show your best effort, your nurse or respiratory therapist will set it to a desired goal.  If possible, sit up straight or lean slightly forward. Try not to slouch.  Hold the incentive spirometer in an upright position. INSTRUCTIONS FOR USE  1. Sit on the edge of your bed if possible, or sit up as far as you can in bed or on a chair. 2. Hold the incentive spirometer in an upright position. 3. Breathe out normally. 4. Place the mouthpiece in your mouth and seal your lips tightly around it. 5. Breathe in slowly and as deeply as possible, raising the piston or the ball toward the top of the column. 6. Hold your breath for 3-5 seconds or for as long as possible. Allow the piston or ball to fall to the bottom of the column. 7. Remove the mouthpiece from your mouth and breathe out normally. 8. Rest for a few seconds and repeat Steps 1 through 7 at least 10 times every 1-2 hours when you are awake. Take your time and take a few normal breaths between deep breaths. 9. The spirometer may include an indicator to show your best effort. Use the indicator as a goal to work toward during each repetition. 10. After each set of 10 deep breaths, practice coughing to be sure your lungs are clear. If you have an incision (the cut made at the time of surgery), support your incision when coughing by placing a pillow or rolled up towels firmly against it. Once you are able to get out of bed, walk around indoors and cough well. You may stop using the incentive spirometer when instructed by your caregiver.  RISKS AND COMPLICATIONS  Take your time so you do not get  dizzy or light-headed.  If you are in pain, you may need to take or ask for pain medication before doing incentive spirometry. It is harder to take a deep breath if you are having pain. AFTER USE  Rest and  breathe slowly and easily.  It can be helpful to keep track of a log of your progress. Your caregiver can provide you with a simple table to help with this. If you are using the spirometer at home, follow these instructions: [place] IF:   You are having difficultly using the spirometer.  You have trouble using the spirometer as often as instructed.  Your pain medication is not giving enough relief while using the spirometer.  You develop fever of 100.5 F (38.1 C) or higher. SEEK IMMEDIATE MEDICAL CARE IF:   You cough up bloody sputum that had not been present before.  You develop fever of 102 F (38.9 C) or greater.  You develop worsening pain at or near the incision site. MAKE SURE YOU:   Understand these instructions.  Will watch your condition.  Will get help right away if you are not doing well or get worse. Document Released: 10/10/2006 Document Revised: 08/22/2011 Document Reviewed: 12/11/2006 [facility] Patient Information 2014 [place], [place].   ________________________________________________________________________

## 2018-05-30 NOTE — Progress Notes (Signed)
CMP, CBCDIFF 05-29-18 Epic  CT CHEST 05-29-18 Epic  LOV CARDIOLOGY DR. HILTY 04-24-18 Epic  STRESS ECHO 04-09-18 Epic  EKG 03-27-18 Epic

## 2018-05-31 ENCOUNTER — Inpatient Hospital Stay (HOSPITAL_BASED_OUTPATIENT_CLINIC_OR_DEPARTMENT_OTHER): Payer: Medicare Other | Admitting: Internal Medicine

## 2018-05-31 ENCOUNTER — Encounter: Payer: Self-pay | Admitting: Internal Medicine

## 2018-05-31 VITALS — BP 130/80 | HR 89 | Temp 98.4°F | Resp 18 | Ht 59.0 in | Wt 91.1 lb

## 2018-05-31 DIAGNOSIS — Z85118 Personal history of other malignant neoplasm of bronchus and lung: Secondary | ICD-10-CM

## 2018-05-31 DIAGNOSIS — C781 Secondary malignant neoplasm of mediastinum: Secondary | ICD-10-CM

## 2018-05-31 DIAGNOSIS — R911 Solitary pulmonary nodule: Secondary | ICD-10-CM

## 2018-05-31 DIAGNOSIS — C3491 Malignant neoplasm of unspecified part of right bronchus or lung: Secondary | ICD-10-CM

## 2018-05-31 DIAGNOSIS — Z902 Acquired absence of lung [part of]: Secondary | ICD-10-CM

## 2018-05-31 DIAGNOSIS — C3411 Malignant neoplasm of upper lobe, right bronchus or lung: Secondary | ICD-10-CM

## 2018-05-31 DIAGNOSIS — C3490 Malignant neoplasm of unspecified part of unspecified bronchus or lung: Secondary | ICD-10-CM

## 2018-05-31 NOTE — Progress Notes (Signed)
[facility] [phone]) [phone]   Fax:([phone]  OFFICE PROGRESS NOTE  [name], [place] [address]  DIAGNOSIS: Stage IIIA (T2a, N2, M0) non-small cell lung cancer, adenocarcinoma with positive EGFR mutation with deletion in exon 19, presented with right upper lobe lung mass and mediastinal lymphadenopathy, status post surgical resection February 2017.  PRIOR THERAPY: 1) Right VATS with right upper lobectomy with bronchoplasty closure and en bloc resection of a wedge from the superior segment of the right lower lobe in addition to mediastinal lymph node dissection on 07/30/2015 under the care of Dr. Roxan Hockey. 2) Adjuvant systemic chemotherapy with cisplatin 75 MG/M2 and Alimta 500 MG/M2 every 3 weeks. First dose was given on 09/10/2015. Status post 4 cycles. 3) adjuvant radiotherapy under the care of Dr. Tammi Klippel.  CURRENT THERAPY: Observation.  INTERVAL HISTORY: CHARA MARQUARD 67 y.o. female returns to the clinic today for follow-up visit accompanied by her husband.  The patient is feeling fine today with no concerning complaints.  She denied having any chest pain, shortness of breath, cough or hemoptysis.  She denied having any fever or chills.  She has no nausea, vomiting, diarrhea or constipation.  She has no headache or visual changes.  She is here today for evaluation and repeat CT scan of the chest, abdomen and pelvis for restaging of her disease.  MEDICAL HISTORY: Past Medical History:  Diagnosis Date  . Anxiety   . Arthritis    shoulders  . Cancer ([place]) 06/2014   Lung Cancer  . Complication of anesthesia    nausea  . Cough 09/17/2015  . Encounter for antineoplastic chemotherapy 10/22/2015  . Environmental and seasonal allergies   . Family history of adverse reaction to anesthesia    Patients grandmother died while having hand surgery; pt unsure of cause  . Oral thrush 10/22/2015  . Ovarian cyst   . PONV  (postoperative nausea and vomiting)   . Raynaud disease     ALLERGIES:  is allergic to avelox [moxifloxacin hcl in nacl]; cephalosporins; clindamycin/lincomycin; penicillins; macrobid [nitrofurantoin monohyd macro]; omnipaque [iohexol]; ciprofloxacin; adhesive [tape]; doxycycline; lidoderm [lidocaine]; pseudoephedrine; and sulfa antibiotics.  MEDICATIONS:  Current Outpatient Medications  Medication Sig Dispense Refill  . acetaminophen (TYLENOL) 500 MG tablet Take 2 tablets (1,000 mg total) by mouth every 6 (six) hours as needed. (Patient taking differently: Take 1,000 mg by mouth every 6 (six) hours as needed for moderate pain or headache. ) 30 tablet 0  . Azelaic Acid (FINACEA) 15 % cream Apply 1 application topically daily. After skin is thoroughly washed and patted dry, gently but thoroughly massage a thin film of azelaic acid cream into the affected area twice daily, in the morning and evening.     . Brimonidine Tartrate (MIRVASO) 0.33 % GEL Apply 1 application topically daily.    . cholecalciferol (VITAMIN D) 1000 UNITS tablet Take 1,000 Units by mouth daily.    . citalopram (CELEXA) 10 MG tablet Take 10 mg by mouth daily.    Marland Kitchen EPINEPHrine 0.3 mg/0.3 mL IJ SOAJ injection Inject 0.3 mg into the muscle once.     . fexofenadine (ALLEGRA) 180 MG tablet Take 180 mg by mouth daily.    Marland Kitchen LORazepam (ATIVAN) 0.5 MG tablet Take 0.5 mg by mouth at bedtime as needed for anxiety or sleep.     . Multiple Vitamin (MULTIVITAMIN WITH MINERALS) TABS Take 1 tablet by mouth daily.    . NONFORMULARY OR COMPOUNDED  ITEM Place 1 application vaginally 2 (two) times a week. Estradiol 0.02% Cream    . triamcinolone (NASACORT ALLERGY 24HR) 55 MCG/ACT AERO nasal inhaler Place 1 spray into the nose daily as needed (for congestion).    . Wheat Dextrin (BENEFIBER) POWD Take 1 Dose by mouth daily.     No current facility-administered medications for this visit.     SURGICAL HISTORY:  Past Surgical History:  Procedure  Laterality Date  . BREAST EXCISIONAL BIOPSY Left    1996  . CESAREAN SECTION  1984  . COLONOSCOPY WITH PROPOFOL N/A 09/27/2012   Procedure: COLONOSCOPY WITH PROPOFOL;  Surgeon: Garlan Fair, MD;  Location: [facility];  Service: Endoscopy;  Laterality: N/A;  . Fibroid Removed    . LAPAROTOMY Left 07/20/2016   Procedure: MINI LAPAROTOMY, REMOVAL OF LEFT OVARIAN CYST, LEFT SALPINGO-OOPHORECTOMY;  Surgeon: Dian Queen, MD;  Location: [facility];  Service: Gynecology;  Laterality: Left;  please moved to 7:30am if spot opens  . Right Rotator Cuff Right   . VIDEO ASSISTED THORACOSCOPY (VATS)/ LOBECTOMY Right 07/30/2015   Procedure: RIGHT VIDEO ASSISTED THORACOSCOPY (VATS)/RIGHT UPPER LOBECTOMY;  Surgeon: Melrose Nakayama, MD;  Location: [facility];  Service: Thoracic;  Laterality: Right;  Marland Kitchen VIDEO BRONCHOSCOPY WITH ENDOBRONCHIAL NAVIGATION N/A 07/15/2015   Procedure: VIDEO BRONCHOSCOPY WITH ENDOBRONCHIAL NAVIGATION with Biopsy;  Surgeon: Collene Gobble, MD;  Location: [facility];  Service: Thoracic;  Laterality: N/A;    REVIEW OF SYSTEMS:  Constitutional: negative Eyes: negative Ears, nose, mouth, throat, and face: negative Respiratory: negative Cardiovascular: negative Gastrointestinal: negative Genitourinary:negative Integument/breast: negative Hematologic/lymphatic: negative Musculoskeletal:negative Neurological: negative Behavioral/Psych: negative Endocrine: negative Allergic/Immunologic: negative   PHYSICAL EXAMINATION: General appearance: alert, cooperative and no distress Head: Normocephalic, without obvious abnormality, atraumatic Neck: no adenopathy, no JVD, supple, symmetrical, trachea midline and thyroid not enlarged, symmetric, no tenderness/mass/nodules Lymph nodes: Cervical, supraclavicular, and axillary nodes normal. Resp: clear to auscultation bilaterally Back: symmetric, no curvature. ROM normal. No CVA tenderness. Cardio: regular rate and rhythm, S1, S2 normal, no murmur,  click, rub or gallop GI: soft, non-tender; bowel sounds normal; no masses,  no organomegaly Extremities: extremities normal, atraumatic, no cyanosis or edema Neurologic: Alert and oriented X 3, normal strength and tone. Normal symmetric reflexes. Normal coordination and gait  ECOG PERFORMANCE STATUS: 0 - Asymptomatic  Blood pressure 130/80, pulse 89, temperature 98.4 F (36.9 C), temperature source Oral, resp. rate 18, height '4\' 11"'$  (1.499 m), weight 91 lb 1.6 oz (41.3 kg), SpO2 100 %.  LABORATORY DATA: Lab Results  Component Value Date   WBC 8.5 05/29/2018   HGB 16.1 (H) 05/29/2018   HCT 47.0 (H) 05/29/2018   MCV 91.3 05/29/2018   PLT 257 05/29/2018      Chemistry      Component Value Date/Time   NA 140 05/29/2018 1012   NA 139 06/16/2017 1132   K 4.2 05/29/2018 1012   K 4.7 06/16/2017 1132   CL 100 05/29/2018 1012   CO2 28 05/29/2018 1012   CO2 28 06/16/2017 1132   BUN 21 05/29/2018 1012   BUN 19.5 06/16/2017 1132   CREATININE 0.88 05/29/2018 1012   CREATININE 0.8 06/16/2017 1132      Component Value Date/Time   CALCIUM 10.3 05/29/2018 1012   CALCIUM 10.0 06/16/2017 1132   ALKPHOS 72 05/29/2018 1012   ALKPHOS 68 06/16/2017 1132   AST 22 05/29/2018 1012   AST 21 06/16/2017 1132   ALT 21 05/29/2018 1012   ALT 18 06/16/2017 1132  BILITOT 0.4 05/29/2018 1012   BILITOT 0.50 06/16/2017 1132       RADIOGRAPHIC STUDIES: Ct Chest W Contrast  Result Date: 05/29/2018 CLINICAL DATA:  Right upper lobe lung cancer post lobectomy 07/30/2015. Radiation and adjuvant chemotherapy completed. EXAM: CT CHEST, ABDOMEN, AND PELVIS WITH CONTRAST TECHNIQUE: Multidetector CT imaging of the chest, abdomen and pelvis was performed following the standard protocol during bolus administration of intravenous contrast. CONTRAST:  164m OMNIPAQUE IOHEXOL 300 MG/ML  SOLN COMPARISON:  Chest CT 06/16/2017 and 11/17/2017.  PET-CT 06/25/2015. FINDINGS: CT CHEST FINDINGS Cardiovascular: Minimal  coronary artery atherosclerosis without acute vascular findings. The heart size is normal. There is no pericardial effusion. Mediastinum/Nodes: There are no enlarged mediastinal, hilar or axillary lymph nodes. The thyroid gland, trachea and esophagus demonstrate no significant findings. Lungs/Pleura: There is no pleural effusion. Status post right upper lobectomy with stable right hilar distortion and right apical scarring. Again noted are numerous small pulmonary nodules bilaterally. These nodules appear unchanged from the most recent study. There are a couple nodules which appear slightly larger from January and older studies, including a 4 mm left lower lobe nodule on image 94/6. No definite changes are seen compared with the most recent study. Musculoskeletal/Chest wall: No chest wall mass or suspicious osseous findings. Stable mild glenohumeral degenerative changes bilaterally. CT ABDOMEN AND PELVIS FINDINGS Hepatobiliary: The liver is normal in density without suspicious focal abnormality. No evidence of gallstones, gallbladder wall thickening or biliary dilatation. Pancreas: Unremarkable. No pancreatic ductal dilatation or surrounding inflammatory changes. Spleen: Normal in size without focal abnormality. Adrenals/Urinary Tract: Both adrenal glands appear normal. The kidneys appear normal without evidence of urinary tract calculus, suspicious lesion or hydronephrosis. No bladder abnormalities are seen. Stomach/Bowel: No evidence of bowel wall thickening, distention or surrounding inflammatory change. Prominent stool throughout the colon. Vascular/Lymphatic: There are no enlarged abdominal or pelvic lymph nodes. Mild aortic and branch vessel atherosclerosis. Reproductive: The uterus and ovaries appear normal. No adnexal mass. The cystic mass in the posterior pelvis seen on prior PET-CT is no longer demonstrated. Other: No evidence of abdominal wall mass or hernia. No ascites. Musculoskeletal: No acute or  significant osseous findings. Stable mild degenerative changes at both hips and bilateral L5 pars defects without significant foraminal narrowing at L5-S1. IMPRESSION: 1. Innumerable small multiple pulmonary nodules are again noted, not significantly changed from previous study of 6 months ago, and likely benign. Some of these nodules are more conspicuous than on older prior studies. 2. No evidence of local recurrence or thoracic adenopathy. 3. No evidence of metastatic disease in the abdomen or pelvis. 4. Mild Aortic Atherosclerosis (ICD10-I70.0). Electronically Signed   By: WRichardean SaleM.D.   On: 05/29/2018 15:26   Ct Abdomen Pelvis W Contrast  Result Date: 05/29/2018 CLINICAL DATA:  Right upper lobe lung cancer post lobectomy 07/30/2015. Radiation and adjuvant chemotherapy completed. EXAM: CT CHEST, ABDOMEN, AND PELVIS WITH CONTRAST TECHNIQUE: Multidetector CT imaging of the chest, abdomen and pelvis was performed following the standard protocol during bolus administration of intravenous contrast. CONTRAST:  1076mOMNIPAQUE IOHEXOL 300 MG/ML  SOLN COMPARISON:  Chest CT 06/16/2017 and 11/17/2017.  PET-CT 06/25/2015. FINDINGS: CT CHEST FINDINGS Cardiovascular: Minimal coronary artery atherosclerosis without acute vascular findings. The heart size is normal. There is no pericardial effusion. Mediastinum/Nodes: There are no enlarged mediastinal, hilar or axillary lymph nodes. The thyroid gland, trachea and esophagus demonstrate no significant findings. Lungs/Pleura: There is no pleural effusion. Status post right upper lobectomy with stable right hilar distortion and  right apical scarring. Again noted are numerous small pulmonary nodules bilaterally. These nodules appear unchanged from the most recent study. There are a couple nodules which appear slightly larger from January and older studies, including a 4 mm left lower lobe nodule on image 94/6. No definite changes are seen compared with the most recent  study. Musculoskeletal/Chest wall: No chest wall mass or suspicious osseous findings. Stable mild glenohumeral degenerative changes bilaterally. CT ABDOMEN AND PELVIS FINDINGS Hepatobiliary: The liver is normal in density without suspicious focal abnormality. No evidence of gallstones, gallbladder wall thickening or biliary dilatation. Pancreas: Unremarkable. No pancreatic ductal dilatation or surrounding inflammatory changes. Spleen: Normal in size without focal abnormality. Adrenals/Urinary Tract: Both adrenal glands appear normal. The kidneys appear normal without evidence of urinary tract calculus, suspicious lesion or hydronephrosis. No bladder abnormalities are seen. Stomach/Bowel: No evidence of bowel wall thickening, distention or surrounding inflammatory change. Prominent stool throughout the colon. Vascular/Lymphatic: There are no enlarged abdominal or pelvic lymph nodes. Mild aortic and branch vessel atherosclerosis. Reproductive: The uterus and ovaries appear normal. No adnexal mass. The cystic mass in the posterior pelvis seen on prior PET-CT is no longer demonstrated. Other: No evidence of abdominal wall mass or hernia. No ascites. Musculoskeletal: No acute or significant osseous findings. Stable mild degenerative changes at both hips and bilateral L5 pars defects without significant foraminal narrowing at L5-S1. IMPRESSION: 1. Innumerable small multiple pulmonary nodules are again noted, not significantly changed from previous study of 6 months ago, and likely benign. Some of these nodules are more conspicuous than on older prior studies. 2. No evidence of local recurrence or thoracic adenopathy. 3. No evidence of metastatic disease in the abdomen or pelvis. 4. Mild Aortic Atherosclerosis (ICD10-I70.0). Electronically Signed   By: Richardean Sale M.D.   On: 05/29/2018 15:26    ASSESSMENT AND PLAN:  This is a very pleasant 67 years old white female with stage IIIA non-small cell lung cancer,  adenocarcinoma with positive EGFR mutation in exon 19 status post right upper lobectomy with lymph node dissection followed by adjuvant systemic chemotherapy as well as adjuvant radiation. She has been on observation now for more than 2 years.  The patient is feeling fine with no concerning complaints.  She had repeat CT scan of the chest, abdomen and pelvis performed recently.  This can showed the innumerable small multiple pulmonary nodules that were not significantly changed from previous study 6 months ago but some of these nodules are more suspicious than older prior studies. I personally and independently reviewed the scan images and discussed the results with the patient and her husband.  I still have some concern about these multiple pulmonary nodules which could be metastatic disease and with history of adenocarcinoma and EGFR mutation. I recommended for the patient to continue on observation with close monitoring and repeat imaging studies in 6 months.  She was also advised to call immediately if she has any concerning symptoms in the interval. The patient and her husband had several questions and answered them completely to their satisfaction. The patient voices understanding of current disease status and treatment options and is in agreement with the current care plan. All questions were answered. The patient knows to call the clinic with any problems, questions or concerns. We can certainly see the patient much sooner if necessary. I spent 15 minutes counseling the patient face to face. The total time spent in the appointment was 25 minutes. Disclaimer: This note was dictated with voice recognition software. Similar  sounding words can inadvertently be transcribed and may not be corrected upon review.

## 2018-06-04 ENCOUNTER — Encounter (HOSPITAL_COMMUNITY)
Admission: RE | Admit: 2018-06-04 | Discharge: 2018-06-04 | Disposition: A | Discharge status: Home / Self Care | Payer: Medicare Other | Source: Ambulatory Visit | Attending: Orthopedic Surgery | Admitting: Orthopedic Surgery

## 2018-06-04 ENCOUNTER — Other Ambulatory Visit: Payer: Self-pay

## 2018-06-04 ENCOUNTER — Encounter (HOSPITAL_COMMUNITY): Payer: Self-pay

## 2018-06-04 DIAGNOSIS — M19012 Primary osteoarthritis, left shoulder: Secondary | ICD-10-CM | POA: Diagnosis not present

## 2018-06-04 DIAGNOSIS — Z01812 Encounter for preprocedural laboratory examination: Secondary | ICD-10-CM | POA: Diagnosis not present

## 2018-06-04 LAB — SURGICAL PCR SCREEN
MRSA, PCR: NEGATIVE
Staphylococcus aureus: NEGATIVE

## 2018-06-13 ENCOUNTER — Encounter (HOSPITAL_COMMUNITY): Payer: Self-pay | Admitting: Anesthesiology

## 2018-06-13 NOTE — Anesthesia Preprocedure Evaluation (Addendum)
Anesthesia Evaluation  Patient identified by MRN, date of birth, ID band Patient awake    Reviewed: Allergy & Precautions, NPO status , Patient's Chart, lab work & pertinent test results  History of Anesthesia Complications (+) PONV and Family history of anesthesia reaction  Airway Mallampati: II  TM Distance: >3 FB Neck ROM: Full    Dental no notable dental hx. (+) Teeth Intact, Dental Advisory Given   Pulmonary shortness of breath,    Pulmonary exam normal breath sounds clear to auscultation       Cardiovascular + CAD  Normal cardiovascular exam Rhythm:Regular Rate:Normal  ECHo 04/09/18 Procedure narrative: Transthoracic stress echocardiography.   Images were captured at baseline and peak exercise. Intravenous   contrast (Definity) was administered. - Left ventricle: The cavity size was normal. Wall thickness was   normal. Systolic function was normal. The estimated ejection   fraction was in the range of 60% to 65%. Wall motion was normal;   there were no regional wall motion abnormalities. Doppler   parameters are consistent with abnormal left ventricular   relaxation (grade 1 diastolic dysfunction). - Stress ECG conclusions: There were no stress arrhythmias or   conduction abnormalities. The stress ECG was normal. - Staged echo: There was no echocardiographic evidence for   stress-induced ischemia.    Neuro/Psych Anxiety    GI/Hepatic negative GI ROS, Neg liver ROS,   Endo/Other  negative endocrine ROS  Renal/GU negative Renal ROS     Musculoskeletal  (+) Arthritis ,   Abdominal   Peds  Hematology   Anesthesia Other Findings Stage # Non Smal Cell Lung CA  Reproductive/Obstetrics                            Lab Results  Component Value Date   CREATININE 0.88 05/29/2018   BUN 21 05/29/2018   NA 140 05/29/2018   K 4.2 05/29/2018   CL 100 05/29/2018   CO2 28 05/29/2018     Lab Results  Component Value Date   WBC 8.5 05/29/2018   HGB 16.1 (H) 05/29/2018   HCT 47.0 (H) 05/29/2018   MCV 91.3 05/29/2018   PLT 257 05/29/2018    Anesthesia Physical Anesthesia Plan  ASA: III  Anesthesia Plan: General   Post-op Pain Management:  Regional for Post-op pain   Induction: Intravenous  PONV Risk Score and Plan: 4 or greater and Treatment may vary due to age or medical condition, Dexamethasone, Ondansetron and Scopolamine patch - Pre-op  Airway Management Planned: Oral ETT  Additional Equipment:   Intra-op Plan:   Post-operative Plan: Extubation in OR  Informed Consent: I have reviewed the patients History and Physical, chart, labs and discussed the procedure including the risks, benefits and alternatives for the proposed anesthesia with the patient or authorized representative who has indicated his/her understanding and acceptance.   Dental advisory given  Plan Discussed with: CRNA  Anesthesia Plan Comments: (exparel interscalene block L)       Anesthesia Quick Evaluation

## 2018-06-14 ENCOUNTER — Inpatient Hospital Stay (HOSPITAL_COMMUNITY): Payer: Medicare Other | Admitting: Anesthesiology

## 2018-06-14 ENCOUNTER — Other Ambulatory Visit: Payer: Self-pay

## 2018-06-14 ENCOUNTER — Encounter (HOSPITAL_COMMUNITY): Payer: Self-pay | Admitting: *Deleted

## 2018-06-14 ENCOUNTER — Encounter (HOSPITAL_COMMUNITY)
Admission: RE | Disposition: A | Discharge status: Home / Self Care | Payer: Self-pay | Source: Home / Self Care | Attending: Orthopedic Surgery

## 2018-06-14 ENCOUNTER — Inpatient Hospital Stay (HOSPITAL_COMMUNITY)
Admission: RE | Admit: 2018-06-14 | Discharge: 2018-06-15 | DRG: 483 | Disposition: A | Discharge status: Home / Self Care | Payer: Medicare Other | Attending: Orthopedic Surgery | Admitting: Orthopedic Surgery

## 2018-06-14 DIAGNOSIS — I7300 Raynaud's syndrome without gangrene: Secondary | ICD-10-CM | POA: Diagnosis present

## 2018-06-14 DIAGNOSIS — Z5111 Encounter for antineoplastic chemotherapy: Secondary | ICD-10-CM | POA: Diagnosis not present

## 2018-06-14 DIAGNOSIS — Z91041 Radiographic dye allergy status: Secondary | ICD-10-CM

## 2018-06-14 DIAGNOSIS — Z884 Allergy status to anesthetic agent status: Secondary | ICD-10-CM

## 2018-06-14 DIAGNOSIS — Z7951 Long term (current) use of inhaled steroids: Secondary | ICD-10-CM | POA: Diagnosis not present

## 2018-06-14 DIAGNOSIS — Z888 Allergy status to other drugs, medicaments and biological substances status: Secondary | ICD-10-CM

## 2018-06-14 DIAGNOSIS — Z880 Allergy status to penicillin: Secondary | ICD-10-CM

## 2018-06-14 DIAGNOSIS — Z8249 Family history of ischemic heart disease and other diseases of the circulatory system: Secondary | ICD-10-CM | POA: Diagnosis not present

## 2018-06-14 DIAGNOSIS — Z881 Allergy status to other antibiotic agents status: Secondary | ICD-10-CM | POA: Diagnosis not present

## 2018-06-14 DIAGNOSIS — G8918 Other acute postprocedural pain: Secondary | ICD-10-CM | POA: Diagnosis not present

## 2018-06-14 DIAGNOSIS — Z85118 Personal history of other malignant neoplasm of bronchus and lung: Secondary | ICD-10-CM | POA: Diagnosis not present

## 2018-06-14 DIAGNOSIS — I2510 Atherosclerotic heart disease of native coronary artery without angina pectoris: Secondary | ICD-10-CM | POA: Diagnosis present

## 2018-06-14 DIAGNOSIS — Z91048 Other nonmedicinal substance allergy status: Secondary | ICD-10-CM | POA: Diagnosis not present

## 2018-06-14 DIAGNOSIS — F419 Anxiety disorder, unspecified: Secondary | ICD-10-CM | POA: Diagnosis present

## 2018-06-14 DIAGNOSIS — M25512 Pain in left shoulder: Secondary | ICD-10-CM | POA: Diagnosis not present

## 2018-06-14 DIAGNOSIS — M19012 Primary osteoarthritis, left shoulder: Secondary | ICD-10-CM | POA: Diagnosis present

## 2018-06-14 DIAGNOSIS — Z882 Allergy status to sulfonamides status: Secondary | ICD-10-CM

## 2018-06-14 DIAGNOSIS — Z96612 Presence of left artificial shoulder joint: Secondary | ICD-10-CM

## 2018-06-14 HISTORY — PX: TOTAL SHOULDER ARTHROPLASTY: SHX126

## 2018-06-14 SURGERY — ARTHROPLASTY, SHOULDER, TOTAL
Anesthesia: General | Site: Shoulder | Laterality: Left

## 2018-06-14 MED ORDER — BUPIVACAINE LIPOSOME 1.3 % IJ SUSP
INTRAMUSCULAR | Status: DC | PRN
  Administered 2018-06-14: 10 mL via PERINEURAL

## 2018-06-14 MED ORDER — SODIUM CHLORIDE 0.9 % IR SOLN
Status: DC | PRN
  Administered 2018-06-14: 1000 mL

## 2018-06-14 MED ORDER — ONDANSETRON HCL 4 MG/2ML IJ SOLN
INTRAMUSCULAR | Status: DC | PRN
  Administered 2018-06-14: 4 mg via INTRAVENOUS

## 2018-06-14 MED ORDER — ONDANSETRON HCL 4 MG/2ML IJ SOLN
INTRAMUSCULAR | Status: AC
  Filled 2018-06-14: qty 6

## 2018-06-14 MED ORDER — BUPIVACAINE HCL (PF) 0.5 % IJ SOLN
INTRAMUSCULAR | Status: AC
  Filled 2018-06-14: qty 30

## 2018-06-14 MED ORDER — KETOROLAC TROMETHAMINE 15 MG/ML IJ SOLN
7.5000 mg | Freq: Four times a day (QID) | INTRAMUSCULAR | Status: AC
  Administered 2018-06-14 – 2018-06-15 (×4): 7.5 mg via INTRAVENOUS
  Filled 2018-06-14 (×4): qty 1

## 2018-06-14 MED ORDER — PANTOPRAZOLE SODIUM 40 MG PO TBEC
40.0000 mg | DELAYED_RELEASE_TABLET | Freq: Every day | ORAL | Status: DC
  Administered 2018-06-14 – 2018-06-15 (×2): 40 mg via ORAL
  Filled 2018-06-14 (×2): qty 1

## 2018-06-14 MED ORDER — ONDANSETRON HCL 4 MG PO TABS: 4.0000 mg | ORAL_TABLET | Freq: Four times a day (QID) | ORAL | Status: DC | PRN

## 2018-06-14 MED ORDER — BUPIVACAINE HCL (PF) 0.25 % IJ SOLN
INTRAMUSCULAR | Status: AC
  Filled 2018-06-14: qty 30

## 2018-06-14 MED ORDER — SODIUM CHLORIDE 0.9 % IV SOLN
INTRAVENOUS | Status: DC | PRN
  Administered 2018-06-14: 25 ug/min via INTRAVENOUS

## 2018-06-14 MED ORDER — DEXAMETHASONE SODIUM PHOSPHATE 10 MG/ML IJ SOLN
INTRAMUSCULAR | Status: DC | PRN
  Administered 2018-06-14: 8 mg via INTRAVENOUS

## 2018-06-14 MED ORDER — FENTANYL CITRATE (PF) 100 MCG/2ML IJ SOLN
INTRAMUSCULAR | Status: DC | PRN
  Administered 2018-06-14 (×2): 50 ug via INTRAVENOUS

## 2018-06-14 MED ORDER — FLEET ENEMA 7-19 GM/118ML RE ENEM: 1.0000 | ENEMA | Freq: Once | RECTAL | Status: DC | PRN

## 2018-06-14 MED ORDER — SCOPOLAMINE 1 MG/3DAYS TD PT72
MEDICATED_PATCH | TRANSDERMAL | Status: AC
  Filled 2018-06-14: qty 1

## 2018-06-14 MED ORDER — ALUM & MAG HYDROXIDE-SIMETH 200-200-20 MG/5ML PO SUSP: 30.0000 mL | ORAL | Status: DC | PRN

## 2018-06-14 MED ORDER — MIDAZOLAM HCL 5 MG/5ML IJ SOLN
INTRAMUSCULAR | Status: DC | PRN
  Administered 2018-06-14 (×2): 1 mg via INTRAVENOUS

## 2018-06-14 MED ORDER — SUGAMMADEX SODIUM 200 MG/2ML IV SOLN
INTRAVENOUS | Status: AC
  Filled 2018-06-14: qty 2

## 2018-06-14 MED ORDER — BISACODYL 5 MG PO TBEC: 5.0000 mg | DELAYED_RELEASE_TABLET | Freq: Every day | ORAL | Status: DC | PRN

## 2018-06-14 MED ORDER — ROCURONIUM BROMIDE 10 MG/ML (PF) SYRINGE
PREFILLED_SYRINGE | INTRAVENOUS | Status: DC | PRN
  Administered 2018-06-14: 30 mg via INTRAVENOUS

## 2018-06-14 MED ORDER — PROPOFOL 10 MG/ML IV BOLUS
INTRAVENOUS | Status: AC
  Filled 2018-06-14: qty 40

## 2018-06-14 MED ORDER — LACTATED RINGERS IV SOLN
INTRAVENOUS | Status: DC
  Administered 2018-06-14: 06:00:00 via INTRAVENOUS

## 2018-06-14 MED ORDER — DEXAMETHASONE SODIUM PHOSPHATE 10 MG/ML IJ SOLN
INTRAMUSCULAR | Status: AC
  Filled 2018-06-14: qty 3

## 2018-06-14 MED ORDER — METOCLOPRAMIDE HCL 5 MG/ML IJ SOLN: 5.0000 mg | Freq: Three times a day (TID) | INTRAMUSCULAR | Status: DC | PRN

## 2018-06-14 MED ORDER — OXYCODONE HCL 5 MG PO TABS
5.0000 mg | ORAL_TABLET | ORAL | Status: DC | PRN
  Administered 2018-06-15 (×2): 5 mg via ORAL
  Filled 2018-06-14: qty 1

## 2018-06-14 MED ORDER — SUGAMMADEX SODIUM 200 MG/2ML IV SOLN
INTRAVENOUS | Status: DC | PRN
  Administered 2018-06-14: 100 mg via INTRAVENOUS

## 2018-06-14 MED ORDER — FENTANYL CITRATE (PF) 100 MCG/2ML IJ SOLN
INTRAMUSCULAR | Status: AC
  Filled 2018-06-14: qty 2

## 2018-06-14 MED ORDER — DIPHENHYDRAMINE HCL 12.5 MG/5ML PO ELIX: 12.5000 mg | ORAL_SOLUTION | ORAL | Status: DC | PRN

## 2018-06-14 MED ORDER — CHLORHEXIDINE GLUCONATE 4 % EX LIQD: 60.0000 mL | Freq: Once | CUTANEOUS | Status: DC

## 2018-06-14 MED ORDER — OXYCODONE HCL 5 MG PO TABS
10.0000 mg | ORAL_TABLET | ORAL | Status: DC | PRN
  Filled 2018-06-14: qty 2

## 2018-06-14 MED ORDER — KETOROLAC TROMETHAMINE 15 MG/ML IJ SOLN: 15.0000 mg | Freq: Once | INTRAMUSCULAR | Status: DC | PRN

## 2018-06-14 MED ORDER — BUPIVACAINE HCL (PF) 0.5 % IJ SOLN
INTRAMUSCULAR | Status: DC | PRN
  Administered 2018-06-14: 9 mL

## 2018-06-14 MED ORDER — LACTATED RINGERS IV SOLN
INTRAVENOUS | Status: DC
  Administered 2018-06-14 – 2018-06-15 (×2): via INTRAVENOUS

## 2018-06-14 MED ORDER — ONDANSETRON HCL 4 MG/2ML IJ SOLN: 4.0000 mg | Freq: Once | INTRAMUSCULAR | Status: DC | PRN

## 2018-06-14 MED ORDER — ACETAMINOPHEN 325 MG PO TABS: 325.0000 mg | ORAL_TABLET | Freq: Four times a day (QID) | ORAL | Status: DC | PRN

## 2018-06-14 MED ORDER — PHENOL 1.4 % MT LIQD
1.0000 | OROMUCOSAL | Status: DC | PRN
  Filled 2018-06-14: qty 177

## 2018-06-14 MED ORDER — MENTHOL 3 MG MT LOZG: 1.0000 | LOZENGE | OROMUCOSAL | Status: DC | PRN

## 2018-06-14 MED ORDER — MIDAZOLAM HCL 2 MG/2ML IJ SOLN
INTRAMUSCULAR | Status: AC
  Filled 2018-06-14: qty 2

## 2018-06-14 MED ORDER — METHOCARBAMOL 500 MG IVPB - SIMPLE MED
500.0000 mg | Freq: Four times a day (QID) | INTRAVENOUS | Status: DC | PRN
  Filled 2018-06-14: qty 50

## 2018-06-14 MED ORDER — ACETAMINOPHEN 325 MG PO TABS
325.0000 mg | ORAL_TABLET | Freq: Four times a day (QID) | ORAL | Status: DC | PRN
  Administered 2018-06-14 (×2): 650 mg via ORAL
  Filled 2018-06-14 (×2): qty 2

## 2018-06-14 MED ORDER — SCOPOLAMINE 1 MG/3DAYS TD PT72
MEDICATED_PATCH | TRANSDERMAL | Status: DC | PRN
  Administered 2018-06-14: 1 via TRANSDERMAL

## 2018-06-14 MED ORDER — LIDOCAINE 2% (20 MG/ML) 5 ML SYRINGE
INTRAMUSCULAR | Status: DC | PRN
  Administered 2018-06-14: 40 mg via INTRAVENOUS

## 2018-06-14 MED ORDER — FENTANYL CITRATE (PF) 100 MCG/2ML IJ SOLN: 25.0000 ug | INTRAMUSCULAR | Status: DC | PRN

## 2018-06-14 MED ORDER — CITALOPRAM HYDROBROMIDE 10 MG PO TABS
10.0000 mg | ORAL_TABLET | Freq: Every day | ORAL | Status: DC
  Administered 2018-06-15: 10 mg via ORAL
  Filled 2018-06-14: qty 1

## 2018-06-14 MED ORDER — PHENYLEPHRINE 40 MCG/ML (10ML) SYRINGE FOR IV PUSH (FOR BLOOD PRESSURE SUPPORT)
PREFILLED_SYRINGE | INTRAVENOUS | Status: AC
  Filled 2018-06-14: qty 10

## 2018-06-14 MED ORDER — PROPOFOL 10 MG/ML IV BOLUS
INTRAVENOUS | Status: DC | PRN
  Administered 2018-06-14: 140 mg via INTRAVENOUS

## 2018-06-14 MED ORDER — HYDROMORPHONE HCL 1 MG/ML IJ SOLN: 0.5000 mg | INTRAMUSCULAR | Status: DC | PRN

## 2018-06-14 MED ORDER — ACETAMINOPHEN 325 MG PO TABS
650.0000 mg | ORAL_TABLET | Freq: Once | ORAL | Status: AC
  Administered 2018-06-14: 650 mg via ORAL
  Filled 2018-06-14: qty 2

## 2018-06-14 MED ORDER — LORAZEPAM 0.5 MG PO TABS
0.5000 mg | ORAL_TABLET | Freq: Every evening | ORAL | Status: DC | PRN
  Administered 2018-06-15: 0.5 mg via ORAL
  Filled 2018-06-14: qty 1

## 2018-06-14 MED ORDER — ROCURONIUM BROMIDE 10 MG/ML (PF) SYRINGE
PREFILLED_SYRINGE | INTRAVENOUS | Status: AC
  Filled 2018-06-14: qty 30

## 2018-06-14 MED ORDER — PHENYLEPHRINE HCL 10 MG/ML IJ SOLN
INTRAMUSCULAR | Status: AC
  Filled 2018-06-14: qty 1

## 2018-06-14 MED ORDER — LIDOCAINE 2% (20 MG/ML) 5 ML SYRINGE
INTRAMUSCULAR | Status: AC
  Filled 2018-06-14: qty 15

## 2018-06-14 MED ORDER — ONDANSETRON HCL 4 MG/2ML IJ SOLN: 4.0000 mg | Freq: Four times a day (QID) | INTRAMUSCULAR | Status: DC | PRN

## 2018-06-14 MED ORDER — TRANEXAMIC ACID-NACL 1000-0.7 MG/100ML-% IV SOLN
1000.0000 mg | INTRAVENOUS | Status: AC
  Administered 2018-06-14: 1000 mg via INTRAVENOUS
  Filled 2018-06-14: qty 100

## 2018-06-14 MED ORDER — DOCUSATE SODIUM 100 MG PO CAPS
100.0000 mg | ORAL_CAPSULE | Freq: Two times a day (BID) | ORAL | Status: DC
  Administered 2018-06-14 – 2018-06-15 (×2): 100 mg via ORAL
  Filled 2018-06-14 (×2): qty 1

## 2018-06-14 MED ORDER — METHOCARBAMOL 500 MG PO TABS
500.0000 mg | ORAL_TABLET | Freq: Four times a day (QID) | ORAL | Status: DC | PRN
  Administered 2018-06-15: 500 mg via ORAL
  Filled 2018-06-14 (×2): qty 1

## 2018-06-14 MED ORDER — VANCOMYCIN HCL IN DEXTROSE 1-5 GM/200ML-% IV SOLN
1000.0000 mg | INTRAVENOUS | Status: AC
  Administered 2018-06-14: 1000 mg via INTRAVENOUS
  Filled 2018-06-14: qty 200

## 2018-06-14 MED ORDER — TRAMADOL HCL 50 MG PO TABS: 50.0000 mg | ORAL_TABLET | Freq: Four times a day (QID) | ORAL | Status: DC | PRN

## 2018-06-14 MED ORDER — METOCLOPRAMIDE HCL 5 MG PO TABS: 5.0000 mg | ORAL_TABLET | Freq: Three times a day (TID) | ORAL | Status: DC | PRN

## 2018-06-14 MED ORDER — PHENYLEPHRINE 40 MCG/ML (10ML) SYRINGE FOR IV PUSH (FOR BLOOD PRESSURE SUPPORT)
PREFILLED_SYRINGE | INTRAVENOUS | Status: DC | PRN
  Administered 2018-06-14 (×5): 80 ug via INTRAVENOUS

## 2018-06-14 MED ORDER — POLYETHYLENE GLYCOL 3350 17 G PO PACK: 17.0000 g | PACK | Freq: Every day | ORAL | Status: DC | PRN

## 2018-06-14 SURGICAL SUPPLY — 64 items
BAG ZIPLOCK 12X15 (MISCELLANEOUS) ×2 IMPLANT
BIT DRILL 2.0X128 (BIT) ×2 IMPLANT
BLADE SAW SGTL 83.5X18.5 (BLADE) ×2 IMPLANT
BLADE SURG SZ10 CARB STEEL (BLADE) ×4 IMPLANT
CEMENT BONE DEPUY (Cement) ×3 IMPLANT
COVER SURGICAL LIGHT HANDLE (MISCELLANEOUS) ×2 IMPLANT
COVER WAND RF STERILE (DRAPES) IMPLANT
DECANTER SPIKE VIAL GLASS SM (MISCELLANEOUS) ×2 IMPLANT
DERMABOND ADVANCED (GAUZE/BANDAGES/DRESSINGS) ×1
DERMABOND ADVANCED .7 DNX12 (GAUZE/BANDAGES/DRESSINGS) ×1 IMPLANT
DRAPE ORTHO SPLIT 77X108 STRL (DRAPES) ×1
DRAPE POUCH INSTRU U-SHP 10X18 (DRAPES) ×2 IMPLANT
DRAPE SURG 17X11 SM STRL (DRAPES) ×2 IMPLANT
DRAPE SURG ORHT 6 SPLT 77X108 (DRAPES) ×1 IMPLANT
DRSG AQUACEL AG ADV 3.5X 6 (GAUZE/BANDAGES/DRESSINGS) ×1 IMPLANT
DRSG AQUACEL AG ADV 3.5X10 (GAUZE/BANDAGES/DRESSINGS) ×2 IMPLANT
DURAPREP 26ML APPLICATOR (WOUND CARE) ×4 IMPLANT
ELECT BLADE TIP CTD 4 INCH (ELECTRODE) ×2 IMPLANT
ELECT REM PT RETURN 15FT ADLT (MISCELLANEOUS) ×2 IMPLANT
FACESHIELD WRAPAROUND (MASK) ×8 IMPLANT
FACESHIELD WRAPAROUND OR TEAM (MASK) ×3 IMPLANT
GLENOID WITH CLEAT SM (Miscellaneous) ×1 IMPLANT
GLOVE BIO SURGEON STRL SZ7.5 (GLOVE) ×2 IMPLANT
GLOVE BIO SURGEON STRL SZ8 (GLOVE) ×2 IMPLANT
GLOVE SS BIOGEL STRL SZ 7 (GLOVE) ×1 IMPLANT
GLOVE SS BIOGEL STRL SZ 7.5 (GLOVE) ×1 IMPLANT
GLOVE SUPERSENSE BIOGEL SZ 7 (GLOVE) ×1
GLOVE SUPERSENSE BIOGEL SZ 7.5 (GLOVE) ×1
GOWN STRL REUS W/ TWL XL LVL3 (GOWN DISPOSABLE) ×2 IMPLANT
GOWN STRL REUS W/TWL XL LVL3 (GOWN DISPOSABLE) ×2
HEAD HUMERAL UNIVERSE 42X17 (Head) ×1 IMPLANT
KIT BASIN OR (CUSTOM PROCEDURE TRAY) ×2 IMPLANT
KIT SET UNIVERSAL (KITS) ×1 IMPLANT
MANIFOLD NEPTUNE II (INSTRUMENTS) ×2 IMPLANT
NDL TAPERED W/ NITINOL LOOP (MISCELLANEOUS) ×1 IMPLANT
NEEDLE HYPO 22GX1.5 SAFETY (NEEDLE) ×1 IMPLANT
NEEDLE TAPERED W/ NITINOL LOOP (MISCELLANEOUS) ×2 IMPLANT
NS IRRIG 1000ML POUR BTL (IV SOLUTION) ×2 IMPLANT
PACK SHOULDER (CUSTOM PROCEDURE TRAY) ×2 IMPLANT
PASSER SUT SWANSON 36MM LOOP (INSTRUMENTS) ×1 IMPLANT
PROTECTOR NERVE ULNAR (MISCELLANEOUS) ×2 IMPLANT
SLING ARM FOAM STRAP LRG (SOFTGOODS) ×2 IMPLANT
SLING ARM FOAM STRAP MED (SOFTGOODS) ×2 IMPLANT
SMARTMIX MINI TOWER (MISCELLANEOUS) ×2
SPONGE LAP 4X18 RFD (DISPOSABLE) ×2 IMPLANT
STAPLER VISISTAT 35W (STAPLE) IMPLANT
STEM HUMERAL UNI APEX 10MM (Shoulder) ×1 IMPLANT
SUCTION FRAZIER HANDLE 12FR (TUBING) ×1
SUCTION TUBE FRAZIER 12FR DISP (TUBING) ×1 IMPLANT
SUT ETHIBOND 2 OS 4 DA (SUTURE) IMPLANT
SUT FIBERWIRE #2 38 T-5 BLUE (SUTURE) ×2
SUT MNCRL AB 3-0 PS2 18 (SUTURE) ×2 IMPLANT
SUT MON AB 2-0 CT1 36 (SUTURE) ×2 IMPLANT
SUT VIC AB 1 CT1 36 (SUTURE) ×2 IMPLANT
SUT VIC AB 2-0 CT1 27 (SUTURE) ×1
SUT VIC AB 2-0 CT1 TAPERPNT 27 (SUTURE) ×1 IMPLANT
SUTURE FIBERWR #2 38 T-5 BLUE (SUTURE) IMPLANT
SUTURE TAPE 1.3 40 TPR END (SUTURE) ×5 IMPLANT
SUTURETAPE 1.3 40 TPR END (SUTURE) ×4
SYR 10ML LL (SYRINGE) ×1 IMPLANT
TOWEL OR 17X26 10 PK STRL BLUE (TOWEL DISPOSABLE) ×2 IMPLANT
TOWEL OR NON WOVEN STRL DISP B (DISPOSABLE) ×2 IMPLANT
TOWER SMARTMIX MINI (MISCELLANEOUS) IMPLANT
WATER STERILE IRR 1000ML POUR (IV SOLUTION) ×4 IMPLANT

## 2018-06-14 NOTE — Anesthesia Procedure Notes (Signed)
Procedure Name: Intubation Date/Time: 06/14/2018 7:51 AM Performed by: Lavina Hamman, CRNA Pre-anesthesia Checklist: Patient identified, Emergency Drugs available, Suction available, Patient being monitored and Timeout performed Patient Re-evaluated:Patient Re-evaluated prior to induction Oxygen Delivery Method: Circle system utilized Preoxygenation: Pre-oxygenation with 100% oxygen Induction Type: IV induction Ventilation: Mask ventilation without difficulty Laryngoscope Size: Mac and 3 Grade View: Grade I Tube type: Oral Tube size: 7.0 mm Number of attempts: 1 Airway Equipment and Method: Stylet Placement Confirmation: ETT inserted through vocal cords under direct vision,  positive ETCO2,  CO2 detector and breath sounds checked- equal and bilateral Secured at: 21 cm Tube secured with: Tape Dental Injury: Teeth and Oropharynx as per pre-operative assessment  Comments: ATOI.

## 2018-06-14 NOTE — Anesthesia Postprocedure Evaluation (Signed)
Anesthesia Post Note  Patient: [name] West  Procedure(s) Performed: TOTAL SHOULDER ARTHROPLASTY (Left Shoulder)     Patient location during evaluation: PACU Anesthesia Type: General and Regional Level of consciousness: awake and alert Pain management: pain level controlled Vital Signs Assessment: post-procedure vital signs reviewed and stable Respiratory status: spontaneous breathing, nonlabored ventilation, respiratory function stable and patient connected to nasal cannula oxygen Cardiovascular status: blood pressure returned to baseline and stable Postop Assessment: no apparent nausea or vomiting Anesthetic complications: no    Last Vitals:  Vitals:   06/14/18 1248 06/14/18 1354  BP: 92/60 (!) 91/58  Pulse: 94 93  Resp: 18 16  Temp: 36.6 C 36.9 C  SpO2: 96% 95%    Last Pain:  Vitals:   06/14/18 1354  TempSrc: Oral  PainSc:                  [place]et [place]

## 2018-06-14 NOTE — H&P (Signed)
[name] West    Chief Complaint: left shoulder osteoarthritis HPI: The patient is a 68 y.o. female with increasing pain and functinal limitations secondary to end stage left shoulder OA  Past Medical History:  Diagnosis Date  . Anxiety   . Arthritis    shoulders  . Cancer ([place]) 06/2014   Lung Cancer  . Complication of anesthesia    nausea, does well with PROPOFOL   . Cough 09/17/2015  . Encounter for antineoplastic chemotherapy 10/22/2015  . Environmental and seasonal allergies   . Family history of adverse reaction to anesthesia    Patients grandmother died while having hand surgery; pt unsure of cause  . Oral thrush 10/22/2015  . Ovarian cyst   . PONV (postoperative nausea and vomiting)   . Raynaud disease     Past Surgical History:  Procedure Laterality Date  . BREAST EXCISIONAL BIOPSY Left    1996  . CESAREAN SECTION  1984  . COLONOSCOPY WITH PROPOFOL N/A 09/27/2012   Procedure: COLONOSCOPY WITH PROPOFOL;  Surgeon: Garlan Fair, MD;  Location: [facility];  Service: Endoscopy;  Laterality: N/A;  . Fibroid Removed    . LAPAROTOMY Left 07/20/2016   Procedure: MINI LAPAROTOMY, REMOVAL OF LEFT OVARIAN CYST, LEFT SALPINGO-OOPHORECTOMY;  Surgeon: Dian Queen, MD;  Location: [facility];  Service: Gynecology;  Laterality: Left;  please moved to 7:30am if spot opens  . Right Rotator Cuff Right   . VIDEO ASSISTED THORACOSCOPY (VATS)/ LOBECTOMY Right 07/30/2015   Procedure: RIGHT VIDEO ASSISTED THORACOSCOPY (VATS)/RIGHT UPPER LOBECTOMY;  Surgeon: Melrose Nakayama, MD;  Location: [facility];  Service: Thoracic;  Laterality: Right;  Marland Kitchen VIDEO BRONCHOSCOPY WITH ENDOBRONCHIAL NAVIGATION N/A 07/15/2015   Procedure: VIDEO BRONCHOSCOPY WITH ENDOBRONCHIAL NAVIGATION with Biopsy;  Surgeon: Collene Gobble, MD;  Location: [facility];  Service: Thoracic;  Laterality: N/A;    Family History  Problem Relation Age of Onset  . Heart disease Brother   . Heart attack Brother     Social History:  reports  that she has never smoked. She has never used smokeless tobacco. She reports current alcohol use. She reports that she does not use drugs.   Medications Prior to Admission  Medication Sig Dispense Refill  . Azelaic Acid (FINACEA) 15 % cream Apply 1 application topically daily. After skin is thoroughly washed and patted dry, gently but thoroughly massage a thin film of azelaic acid cream into the affected area twice daily, in the morning and evening.     . Brimonidine Tartrate (MIRVASO) 0.33 % GEL Apply 1 application topically daily.    . cholecalciferol (VITAMIN D) 1000 UNITS tablet Take 1,000 Units by mouth daily.    . citalopram (CELEXA) 10 MG tablet Take 10 mg by mouth daily.    Marland Kitchen EPINEPHrine 0.3 mg/0.3 mL IJ SOAJ injection Inject 0.3 mg into the muscle once.     . fexofenadine (ALLEGRA) 180 MG tablet Take 180 mg by mouth daily.    Marland Kitchen LORazepam (ATIVAN) 0.5 MG tablet Take 0.5 mg by mouth at bedtime as needed for anxiety or sleep.     . Multiple Vitamin (MULTIVITAMIN WITH MINERALS) TABS Take 1 tablet by mouth daily.    . NONFORMULARY OR COMPOUNDED ITEM Place 1 application vaginally 2 (two) times a week. Estradiol 0.02% Cream    . triamcinolone (NASACORT ALLERGY 24HR) 55 MCG/ACT AERO nasal inhaler Place 1 spray into the nose daily as needed (for congestion).    . Wheat Dextrin (BENEFIBER) POWD Take 1 Dose by  mouth daily.    Marland Kitchen acetaminophen (TYLENOL) 500 MG tablet Take 2 tablets (1,000 mg total) by mouth every 6 (six) hours as needed. (Patient taking differently: Take 1,000 mg by mouth every 6 (six) hours as needed for moderate pain or headache. ) 30 tablet 0  . omeprazole (PRILOSEC OTC) 20 MG tablet Take 20 mg by mouth daily.       Physical Exam: Left shoulder with painful and restricted motion as noted at recent office visits  Vitals  Temp:  [97.7 F (36.5 C)] 97.7 F (36.5 C) (01/02 0545) Pulse Rate:  [78] 78 (01/02 0545) Resp:  [14] 14 (01/02 0545) BP: (129)/(84) 129/84 (01/02  0545) SpO2:  [100 %] 100 % (01/02 0545)  Assessment/Plan  Impression: left shoulder osteoarthritis  Plan of Action: Procedure(s): TOTAL SHOULDER ARTHROPLASTY  KEVIN M SUPPLE 06/14/2018, 5:57 AM Contact # [phone]

## 2018-06-14 NOTE — Anesthesia Procedure Notes (Signed)
Anesthesia Regional Block: Interscalene brachial plexus block   Pre-Anesthetic Checklist: ,, timeout performed, Correct Patient, Correct Site, Correct Laterality, Correct Procedure, Correct Position, site marked, Risks and benefits discussed,  Surgical consent,  Pre-op evaluation,  At surgeon's request and post-op pain management  Laterality: Upper and Left  Prep: Maximum Sterile Barrier Precautions used, chloraprep       Needles:  Injection technique: Single-shot  Needle Type: Echogenic Needle     Needle Length: 5cm  Needle Gauge: 21     Additional Needles:   Procedures:,,,, ultrasound used (permanent image in chart),,,,  Narrative:  Start time: 06/14/2018 7:10 AM End time: 06/14/2018 7:18 AM Injection made incrementally with aspirations every 5 mL.  Performed by: Personally  Anesthesiologist: Barnet Glasgow, MD  Additional Notes: Block assessed prior to procedure. Patient tolerated procedure well.

## 2018-06-14 NOTE — Op Note (Signed)
06/14/2018  9:29 AM  PATIENT:   [name] West  68 y.o. female  PRE-OPERATIVE DIAGNOSIS:  left shoulder osteoarthritis  POST-OPERATIVE DIAGNOSIS: Same  PROCEDURE: Left total shoulder arthroplasty utilizing a press-fit size 10 Arthrex stem with a 42 x 17 eccentric head and a small glenoid  SURGEON:  , Metta Clines M.D.  ASSISTANTS: Jenetta Loges, PA-C  ANESTHESIA:   General endotracheal as well as an interscalene block with Exparel  EBL: 100 cc  SPECIMEN: None  Drains: None   PATIENT DISPOSITION:  PACU - hemodynamically stable.    PLAN OF CARE: Admit for overnight observation  Brief history:  [name] West has been followed for chronic and progressively increasing left shoulder pain and functional limitations related to end-stage osteoarthritis.  She is brought to the operating this time for planned left total shoulder arthroplasty  Preoperatively and counseled the patient regarding treatment options as well as the potential risks versus benefits thereof.  Possible surgical complications were reviewed including potential bleeding, infection, neurovascular injury, persistent pain, loss of motion, anesthetic complication, and possible need for additional surgery.  She understands and accepts and agrees with her planned procedure.  Procedure detail:  After undergoing routine preop evaluation patient received prophylactic antibiotics and interscalene block with Exparel was established in the holding area by the anesthesia department.  Placed supine on the operative underwent smooth induction of a general endotracheal anesthesia.  Placed into the beachchair position and appropriately padded and protected.  Left shoulder girdle region was sterilely prepped and draped in standard fashion.  Timeout was called.  An anterior deltopectoral approach left shoulder was made through an approximate centimeter incision.  Skin flaps were elevated dissection carried deeply vein taken laterally in the  upper centimeter the pectoralis major was tenotomized to improve exposure.  The long head biceps tendon was then tenodesed at the upper border of the pectoralis major and tenotomized and unroofed and excised proximally.  Conjoined tendon retracted medially we then outlined the insertions of the subscapularis and the most superior and inferior aspects of the lesser tuberosity insertions.  Once these were delineated the I then used an oscillating saw to create a lesser tuberosity osteotomy removing a thin wafer of bone and then mobilizing the subscap and reflected this medially and then divided the capsular attachments from the anterior and inferior margins of the humeral neck and then divided the rotator cuff along rotatable to the base of the coracoid and then allowing [place] to deliver the humeral head to the wound and reflected the subscap medially.  Of note we did find a very large cystic area beneath the lesser tuberosity which did find some compromise of the overall margin of the ring of metaphyseal bone at the proximal humerus and we took care to protect this area throughout the procedure.  I outlined our proposed humeral head resection with the extra medullary guide and this was then performed with an oscillating saw at the native retroversion of approxi-20 degrees and carefully protected the rotator cuff superiorly and posteriorly.  The humeral canal was then prepared hand reaming and then broaching up to a size 10 with good fit and fixation.  Size 9 trial with metal cap placed to protect the proximal humeral metaphysis and we then exposed the glenoid with appropriate retractors.  I performed a circumferential labral resection gaining complete visualization of the periphery of the glenoid.  A guidepin was then placed into the center of the glenoid glenoid was then reamed to a stable subchondral bony  bed and the central peripheral drill and pegs were placed using appropriate guides the glenoid was then broached  and a trial showed excellent fit.  In the back table this point we mixed our cement and the glenoid was then irrigated cleaned and dried cement was introduced into the superior and inferior peg and slot respectively and the glenoid was then impacted with excellent fit and fixation.  We then returned our attention back to the proximal humerus canal was cleared our size 10 implant was then prepared with the suture limbs that would be used for the repair of her lesser tuberosity osteotomy.  I placed drill holes through the anterior humeral cortex one medial and to lateral to the lesser tuberosity osteotomy site.  Passing sutures were placed then we shuttled our repair sutures for the LTO through the bone tunnels and then we impacted our final size 10 stem to the appropriate depth obtaining excellent fit and fixation.  The proximal locking screws were then tightened.  We then performed a series of trial reductions and felt that the 42 x 17 head gave [place] the best soft tissue balance with approximate 50% translation of the humeral head on the glenoid and the overall position alignment is most our satisfaction.  The final implant was open the [facility] taper was cleaned and dried the 42 x 17 eccentric head was then impacted in position with excellent fit fixation.  Final reduction was then performed after the joint was irrigated again very pleased with the soft tissue balance and shoulder motion.  The LTO was then repaired back to the proximal humerus using our previously placed suture limbs in our standard construct allowing [place] to use 2 horizontal and 2 crossing suture limbs which allowed excellent and stable fixation of the LTO to the humeral metaphysis.  We then repaired the rotator interval with a pair of figure-of-eight suture tape sutures.  At this point the arm demonstrated easily 45 degrees of external rotation without excessive tension on the subscap repair.  The wound was copes irrigated.  Hemostasis obtained.  The  deltopectoral interval was then reapproximated with a series of figure-of-eight #1 Vicryl sutures.  2-0 Vicryl used for the subcu layer and intracuticular 3 Monocryl for the skin followed by Dermabond and Aquasol dressing left arm was placed to a sling and the patient was awakened, extubated, taken recovery in stable addition.  Jenetta Loges, PA-C was used as an Environmental consultant throughout this case essential for help with positioning the patient, position extremity, tissue manipulation, implantation of prosthesis, wound closure, and intraoperative decision-making.  Metta Clines  MD   Contact # [phone]

## 2018-06-14 NOTE — Transfer of Care (Signed)
Immediate Anesthesia Transfer of Care Note  Patient: [name] West  Procedure(s) Performed: Procedure(s) with comments: TOTAL SHOULDER ARTHROPLASTY (Left) - 14min  Patient Location: PACU  Anesthesia Type:GA combined with regional for post-op pain  Level of Consciousness:  sedated, patient cooperative and responds to stimulation  Airway & Oxygen Therapy:Patient Spontanous Breathing and Patient connected to face mask oxgen  Post-op Assessment:  Report given to PACU RN and Post -op Vital signs reviewed and stable  Post vital signs:  Reviewed and stable  Last Vitals:  Vitals:   06/14/18 0545  BP: 129/84  Pulse: 78  Resp: 14  Temp: 36.5 C  SpO2: 601%    Complications: No apparent anesthesia complications

## 2018-06-15 MED ORDER — ONDANSETRON HCL 4 MG PO TABS: 4.0000 mg | ORAL_TABLET | Freq: Three times a day (TID) | ORAL | 0 refills | Status: DC | PRN

## 2018-06-15 MED ORDER — OXYCODONE-ACETAMINOPHEN 5-325 MG PO TABS: 1.0000 | ORAL_TABLET | ORAL | 0 refills | Status: DC | PRN

## 2018-06-15 MED ORDER — CYCLOBENZAPRINE HCL 10 MG PO TABS: 10.0000 mg | ORAL_TABLET | Freq: Three times a day (TID) | ORAL | 1 refills | Status: DC | PRN

## 2018-06-15 NOTE — Discharge Summary (Signed)
PATIENT ID:      [name] West  MRN:     [id number] DOB/AGE:    01/30/1951 / 68 y.o.     DISCHARGE SUMMARY  ADMISSION DATE:    06/14/2018 DISCHARGE DATE:    ADMISSION DIAGNOSIS: left shoulder osteoarthritis Past Medical History:  Diagnosis Date  . Anxiety   . Arthritis    shoulders  . Cancer ([place]) 06/2014   Lung Cancer  . Complication of anesthesia    nausea, does well with PROPOFOL   . Cough 09/17/2015  . Encounter for antineoplastic chemotherapy 10/22/2015  . Environmental and seasonal allergies   . Family history of adverse reaction to anesthesia    Patients grandmother died while having hand surgery; pt unsure of cause  . Oral thrush 10/22/2015  . Ovarian cyst   . PONV (postoperative nausea and vomiting)   . Raynaud disease     DISCHARGE DIAGNOSIS:   Active Problems:   S/P shoulder replacement, left   PROCEDURE: Procedure(s): TOTAL SHOULDER ARTHROPLASTY on 06/14/2018  CONSULTS:    HISTORY:  See H&P in chart.  HOSPITAL COURSE:  [name] West is a 68 y.o. admitted on 06/14/2018 with a diagnosis of left shoulder osteoarthritis.  They were brought to the operating room on 06/14/2018 and underwent Procedure(s): TOTAL SHOULDER ARTHROPLASTY.    They were given perioperative antibiotics:  Anti-infectives (From admission, onward)   Start     Dose/Rate Route Frequency Ordered Stop   06/14/18 0600  vancomycin (VANCOCIN) IVPB 1000 mg/200 mL premix     1,000 mg 200 mL/hr over 60 Minutes Intravenous On call to O.R. 06/14/18 1937 06/14/18 9024    .  Patient underwent the above named procedure and tolerated it well. The following day they were hemodynamically stable and pain was controlled on oral analgesics. They were neurovascularly intact to the operative extremity. OT was ordered and worked with patient per protocol. They were medically and orthopaedically stable for discharge on day 1 .     DIAGNOSTIC STUDIES:  RECENT RADIOGRAPHIC STUDIES :  Ct Chest W Contrast  Result Date:  05/29/2018 CLINICAL DATA:  Right upper lobe lung cancer post lobectomy 07/30/2015. Radiation and adjuvant chemotherapy completed. EXAM: CT CHEST, ABDOMEN, AND PELVIS WITH CONTRAST TECHNIQUE: Multidetector CT imaging of the chest, abdomen and pelvis was performed following the standard protocol during bolus administration of intravenous contrast. CONTRAST:  157mL OMNIPAQUE IOHEXOL 300 MG/ML  SOLN COMPARISON:  Chest CT 06/16/2017 and 11/17/2017.  PET-CT 06/25/2015. FINDINGS: CT CHEST FINDINGS Cardiovascular: Minimal coronary artery atherosclerosis without acute vascular findings. The heart size is normal. There is no pericardial effusion. Mediastinum/Nodes: There are no enlarged mediastinal, hilar or axillary lymph nodes. The thyroid gland, trachea and esophagus demonstrate no significant findings. Lungs/Pleura: There is no pleural effusion. Status post right upper lobectomy with stable right hilar distortion and right apical scarring. Again noted are numerous small pulmonary nodules bilaterally. These nodules appear unchanged from the most recent study. There are a couple nodules which appear slightly larger from January and older studies, including a 4 mm left lower lobe nodule on image 94/6. No definite changes are seen compared with the most recent study. Musculoskeletal/Chest wall: No chest wall mass or suspicious osseous findings. Stable mild glenohumeral degenerative changes bilaterally. CT ABDOMEN AND PELVIS FINDINGS Hepatobiliary: The liver is normal in density without suspicious focal abnormality. No evidence of gallstones, gallbladder wall thickening or biliary dilatation. Pancreas: Unremarkable. No pancreatic ductal dilatation or surrounding inflammatory changes. Spleen: Normal in size without focal  abnormality. Adrenals/Urinary Tract: Both adrenal glands appear normal. The kidneys appear normal without evidence of urinary tract calculus, suspicious lesion or hydronephrosis. No bladder abnormalities are  seen. Stomach/Bowel: No evidence of bowel wall thickening, distention or surrounding inflammatory change. Prominent stool throughout the colon. Vascular/Lymphatic: There are no enlarged abdominal or pelvic lymph nodes. Mild aortic and branch vessel atherosclerosis. Reproductive: The uterus and ovaries appear normal. No adnexal mass. The cystic mass in the posterior pelvis seen on prior PET-CT is no longer demonstrated. Other: No evidence of abdominal wall mass or hernia. No ascites. Musculoskeletal: No acute or significant osseous findings. Stable mild degenerative changes at both hips and bilateral L5 pars defects without significant foraminal narrowing at L5-S1. IMPRESSION: 1. Innumerable small multiple pulmonary nodules are again noted, not significantly changed from previous study of 6 months ago, and likely benign. Some of these nodules are more conspicuous than on older prior studies. 2. No evidence of local recurrence or thoracic adenopathy. 3. No evidence of metastatic disease in the abdomen or pelvis. 4. Mild Aortic Atherosclerosis (ICD10-I70.0). Electronically Signed   By: Richardean Sale M.D.   On: 05/29/2018 15:26   Ct Abdomen Pelvis W Contrast  Result Date: 05/29/2018 CLINICAL DATA:  Right upper lobe lung cancer post lobectomy 07/30/2015. Radiation and adjuvant chemotherapy completed. EXAM: CT CHEST, ABDOMEN, AND PELVIS WITH CONTRAST TECHNIQUE: Multidetector CT imaging of the chest, abdomen and pelvis was performed following the standard protocol during bolus administration of intravenous contrast. CONTRAST:  18mL OMNIPAQUE IOHEXOL 300 MG/ML  SOLN COMPARISON:  Chest CT 06/16/2017 and 11/17/2017.  PET-CT 06/25/2015. FINDINGS: CT CHEST FINDINGS Cardiovascular: Minimal coronary artery atherosclerosis without acute vascular findings. The heart size is normal. There is no pericardial effusion. Mediastinum/Nodes: There are no enlarged mediastinal, hilar or axillary lymph nodes. The thyroid gland,  trachea and esophagus demonstrate no significant findings. Lungs/Pleura: There is no pleural effusion. Status post right upper lobectomy with stable right hilar distortion and right apical scarring. Again noted are numerous small pulmonary nodules bilaterally. These nodules appear unchanged from the most recent study. There are a couple nodules which appear slightly larger from January and older studies, including a 4 mm left lower lobe nodule on image 94/6. No definite changes are seen compared with the most recent study. Musculoskeletal/Chest wall: No chest wall mass or suspicious osseous findings. Stable mild glenohumeral degenerative changes bilaterally. CT ABDOMEN AND PELVIS FINDINGS Hepatobiliary: The liver is normal in density without suspicious focal abnormality. No evidence of gallstones, gallbladder wall thickening or biliary dilatation. Pancreas: Unremarkable. No pancreatic ductal dilatation or surrounding inflammatory changes. Spleen: Normal in size without focal abnormality. Adrenals/Urinary Tract: Both adrenal glands appear normal. The kidneys appear normal without evidence of urinary tract calculus, suspicious lesion or hydronephrosis. No bladder abnormalities are seen. Stomach/Bowel: No evidence of bowel wall thickening, distention or surrounding inflammatory change. Prominent stool throughout the colon. Vascular/Lymphatic: There are no enlarged abdominal or pelvic lymph nodes. Mild aortic and branch vessel atherosclerosis. Reproductive: The uterus and ovaries appear normal. No adnexal mass. The cystic mass in the posterior pelvis seen on prior PET-CT is no longer demonstrated. Other: No evidence of abdominal wall mass or hernia. No ascites. Musculoskeletal: No acute or significant osseous findings. Stable mild degenerative changes at both hips and bilateral L5 pars defects without significant foraminal narrowing at L5-S1. IMPRESSION: 1. Innumerable small multiple pulmonary nodules are again noted,  not significantly changed from previous study of 6 months ago, and likely benign. Some of these nodules are more conspicuous  than on older prior studies. 2. No evidence of local recurrence or thoracic adenopathy. 3. No evidence of metastatic disease in the abdomen or pelvis. 4. Mild Aortic Atherosclerosis (ICD10-I70.0). Electronically Signed   By: Richardean Sale M.D.   On: 05/29/2018 15:26    RECENT VITAL SIGNS:   Patient Vitals for the past 24 hrs:  BP Temp Temp src Pulse Resp SpO2  06/15/18 0541 110/73 99.3 F (37.4 C) Oral 85 15 97 %  06/15/18 0151 102/67 98.3 F (36.8 C) Oral 85 14 97 %  06/14/18 2141 (!) 92/58 98.7 F (37.1 C) Oral 87 17 96 %  06/14/18 1805 90/61 98.5 F (36.9 C) Oral 92 16 94 %  06/14/18 1354 (!) 91/58 98.4 F (36.9 C) Oral 93 16 95 %  06/14/18 1248 92/60 97.8 F (36.6 C) Oral 94 18 96 %  06/14/18 1157 101/65 97.8 F (36.6 C) Oral 87 18 94 %  06/14/18 1049 104/64 98.2 F (36.8 C) Oral 87 14 95 %  06/14/18 1030 105/69 98 F (36.7 C) - 83 16 96 %  06/14/18 1015 105/69 - - 81 17 100 %  06/14/18 1000 (!) 99/38 - - 79 17 100 %  06/14/18 0945 (!) 103/45 98.8 F (37.1 C) - 81 13 100 %  .  RECENT EKG RESULTS:    Orders placed or performed in visit on 03/27/18  . EKG 12-Lead    DISCHARGE INSTRUCTIONS:    DISCHARGE MEDICATIONS:   Allergies as of 06/15/2018      Reactions   Avelox [moxifloxacin Hcl In Nacl] Anaphylaxis   Cephalosporins Anaphylaxis   Clindamycin/lincomycin Anaphylaxis   Penicillins Anaphylaxis, Other (See Comments)   Has patient had a PCN reaction causing immediate rash, facial/tongue/throat swelling, SOB or lightheadedness with hypotension: Yes Has patient had a PCN reaction causing severe rash involving mucus membranes or skin necrosis: No Has patient had a PCN reaction that required hospitalization Yes Has patient had a PCN reaction occurring within the last 10 years: No If all of the above answers are "NO", then may proceed with  Cephalosporin use.   Macrobid [nitrofurantoin Monohyd Macro] Other (See Comments)   CHEST TIGHTNESS...Marland KitchenHAD USED BEFORE WITH NO REACTION   Omnipaque [iohexol] Hives   Ciprofloxacin Other (See Comments)   Unknown   Epinephrine    Severe palpitations    Adhesive [tape] Rash   Doxycycline Other (See Comments)   Frequently urination    Lidoderm [lidocaine] Rash   Pseudoephedrine Palpitations   Sulfa Antibiotics Rash      Medication List    STOP taking these medications   acetaminophen 500 MG tablet Commonly known as:  TYLENOL     TAKE these medications   BENEFIBER Powd Take 1 Dose by mouth daily.   cholecalciferol 1000 units tablet Commonly known as:  VITAMIN D Take 1,000 Units by mouth daily.   citalopram 10 MG tablet Commonly known as:  CELEXA Take 10 mg by mouth daily.   cyclobenzaprine 10 MG tablet Commonly known as:  FLEXERIL Take 1 tablet (10 mg total) by mouth 3 (three) times daily as needed for muscle spasms.   EPINEPHrine 0.3 mg/0.3 mL Soaj injection Commonly known as:  EPI-PEN Inject 0.3 mg into the muscle once.   fexofenadine 180 MG tablet Commonly known as:  ALLEGRA Take 180 mg by mouth daily.   FINACEA 15 % cream Generic drug:  Azelaic Acid Apply 1 application topically daily. After skin is thoroughly washed and patted dry, gently but thoroughly  massage a thin film of azelaic acid cream into the affected area twice daily, in the morning and evening.   LORazepam 0.5 MG tablet Commonly known as:  ATIVAN Take 0.5 mg by mouth at bedtime as needed for anxiety or sleep.   MIRVASO 0.33 % Gel Generic drug:  Brimonidine Tartrate Apply 1 application topically daily.   multivitamin with minerals Tabs tablet Take 1 tablet by mouth daily.   NASACORT ALLERGY 24HR 55 MCG/ACT Aero nasal inhaler Generic drug:  triamcinolone Place 1 spray into the nose daily as needed (for congestion).   NONFORMULARY OR COMPOUNDED ITEM Place 1 application vaginally 2 (two)  times a week. Estradiol 0.02% Cream   omeprazole 20 MG tablet Commonly known as:  PRILOSEC OTC Take 20 mg by mouth daily.   ondansetron 4 MG tablet Commonly known as:  ZOFRAN Take 1 tablet (4 mg total) by mouth every 8 (eight) hours as needed for nausea or vomiting.   oxyCODONE-acetaminophen 5-325 MG tablet Commonly known as:  PERCOCET Take 1 tablet by mouth every 4 (four) hours as needed (max 6 q).       FOLLOW UP VISIT:   Follow-up Information    Justice Britain, MD.   Specialty:  Orthopedic Surgery Why:  call to be seen in 10-14 days Contact information: [address] [place] [address] [phone]           DISCHARGE TO: Home   DISCHARGE CONDITION:  [place] for Dr. Justice Britain 06/15/2018, 8:14 AM

## 2018-06-15 NOTE — Evaluation (Signed)
Occupational Therapy Evaluation Patient Details Name: [name] West MRN: [id number] DOB: 11/23/1950 Today's Date: 06/15/2018    History of Present Illness s/p L TOTAL SHOULDER ARTHROPLASTY.     Clinical Impression   Pt admitted with the above diagnoses and presents with below problem list. Pt will benefit from continued acute OT to address the below listed deficits and maximize independence with basic ADLs prior to d/c home. PTA pt was independent with ADLs, active. Pt is currently setup to min A with UB/LB ADLs,supervision for functional mobility. Spouse present and included in education. Pt awaiting d/c home this morning. [place] for d/c home from OT standpoint.      Follow Up Recommendations  Follow surgeon's recommendation for DC plan and follow-up therapies    Equipment Recommendations  None recommended by OT    Recommendations for Other Services       Precautions / Restrictions Precautions Precautions: Shoulder Shoulder Interventions: Shoulder sling/immobilizer;Off for dressing/bathing/exercises;At all times(off in controlled environment ) Precaution Booklet Issued: Yes (comment) Required Braces or Orthoses: Sling Restrictions Weight Bearing Restrictions: Yes LUE Weight Bearing: Non weight bearing      Mobility Bed Mobility Overal bed mobility: Modified Independent                Transfers Overall transfer level: Modified independent                    Balance Overall balance assessment: Mild deficits observed, not formally tested                                         ADL either performed or assessed with clinical judgement   ADL Overall ADL's : Needs assistance/impaired Eating/Feeding: Set up;Sitting   Grooming: Minimal assistance;Standing;Set up   Upper Body Bathing: Set up;Minimal assistance;Sitting   Lower Body Bathing: Sit to/from stand;Min guard   Upper Body Dressing : Set up;Minimal assistance;Sitting   Lower Body  Dressing: Min guard   Toilet Transfer: Supervision/safety   Toileting- Clothing Manipulation and Hygiene: Supervision/safety   Tub/ Shower Transfer: Walk-in shower;Supervision/safety   Functional mobility during ADLs: Supervision/safety General ADL Comments: Pt walking halls upon OT arrival. Pt completed UB/LB dressing, bed mobility. ADL education and handout provided     Vision         Perception     Praxis      Pertinent Vitals/Pain Pain Assessment: 0-10 Pain Score: 5  Pain Location: L shoulder Pain Descriptors / Indicators: Sore Pain Intervention(s): Limited activity within patient's tolerance;Monitored during session;Repositioned     Hand Dominance Right   Extremity/Trunk Assessment Upper Extremity Assessment Upper Extremity Assessment: LUE deficits/detail LUE Deficits / Details: s/p L TOTAL SHOULDER ARTHROPLASTY.  Able to use as assist with buttoning shirt. Nerve block mostly, but not completely worn off   Lower Extremity Assessment Lower Extremity Assessment: Overall WFL for tasks assessed   Cervical / Trunk Assessment Cervical / Trunk Assessment: Normal   Communication Communication Communication: No difficulties   Cognition Arousal/Alertness: Awake/alert Behavior During Therapy: WFL for tasks assessed/performed Overall Cognitive Status: Within Functional Limits for tasks assessed                                     General Comments  spouse present throughout session    Exercises Exercises: Other exercises Other Exercises Other  Exercises: pendulums in standing, e/w/h ROM seated   Shoulder Instructions      Home Living Family/patient expects to be discharged to:: Private residence Living Arrangements: Spouse/significant other Available Help at Discharge: Family Type of Home: House Home Access: Stairs to enter Technical brewer of Steps: 4 Entrance Stairs-Rails: Right Home Layout: Two level     Bathroom Shower/Tub: Research scientist (life sciences): None          Prior Functioning/Environment Level of Independence: Independent                 OT Problem List: Decreased range of motion;Impaired balance (sitting and/or standing);Decreased knowledge of use of DME or AE;Decreased knowledge of precautions;Impaired UE functional use;Pain      OT Treatment/Interventions:      OT Goals(Current goals can be found in the care plan section) Acute Rehab OT Goals Patient Stated Goal: home this morning, regain independence  OT Frequency:     Barriers to D/C:            Co-evaluation              AM-PAC OT "6 Clicks" Daily Activity     Outcome Measure Help from another person eating meals?: None Help from another person taking care of personal grooming?: A Little Help from another person toileting, which includes using toliet, bedpan, or urinal?: None Help from another person bathing (including washing, rinsing, drying)?: A Little Help from another person to put on and taking off regular upper body clothing?: A Little Help from another person to put on and taking off regular lower body clothing?: None 6 Click Score: 21   End of Session Equipment Utilized During Treatment: Other (comment)(sling)  Activity Tolerance: Patient tolerated treatment well Patient left: in bed;with call bell/phone within reach;with family/visitor present(sitting EOB)  OT Visit Diagnosis: Unsteadiness on feet (R26.81);Pain Pain - Right/Left: Left Pain - part of body: Shoulder                Time: 0762-2633 OT Time Calculation (min): 30 min Charges:  OT General Charges $OT Visit: 1 Visit OT Evaluation $OT Eval Low Complexity: 1 Low OT Treatments $Self Care/Home Management : 8-22 mins  Tyrone Schimke, OT Acute Rehabilitation Services Pager: [phone] Office: [phone]   Hortencia Pilar 06/15/2018, 9:45 AM

## 2018-06-15 NOTE — Discharge Instructions (Signed)
Metta Clines. Supple, M.D., F.A.A.O.S. Orthopaedic Surgery Specializing in Arthroscopic and Reconstructive Surgery of the Shoulder and Knee [phone] [address] [place], [place] [address] - Fax [phone]   POST-OP TOTAL SHOULDER REPLACEMENT INSTRUCTIONS  1. Call the office at [phone] to schedule your first post-op appointment 10-14 days from the date of your surgery.  2. The bandage over your incision is waterproof. You may begin showering with this dressing on. You may leave this dressing on until first follow up appointment within 2 weeks. We prefer you leave this dressing in place until follow up however after 5-7 days if you are having itching or skin irritation and would like to remove it you may do so. Go slow and tug at the borders gently to break the bond the dressing has with the skin. At this point if there is no drainage it is okay to go without a bandage or you may cover it with a light guaze and tape. You can also expect significant bruising around your shoulder that will drift down your arm and into your chest wall. This is very normal and should resolve over several days.   3. Wear your sling/immobilizer at all times except to perform the exercises below or to occasionally let your arm dangle by your side to stretch your elbow. You also need to sleep in your sling immobilizer until instructed otherwise.  4. Range of motion to your elbow, wrist, and hand are encouraged 3-5 times daily. Exercise to your hand and fingers helps to reduce swelling you may experience.  5. Utilize ice to the shoulder 3-5 times minimum a day and additionally if you are experiencing pain.  6. Prescriptions for a pain medication and a muscle relaxant are provided for you. It is recommended that if you are experiencing pain that you pain medication alone is not controlling, add the muscle relaxant along with the pain medication which can give additional pain relief. The first 1-2 days  is generally the most severe of your pain and then should gradually decrease. As your pain lessens it is recommended that you decrease your use of the pain medications to an "as needed basis'" only and to always comply with the recommended dosages of the pain medications.  7. Pain medications can produce constipation along with their use. If you experience this, the use of an over the counter stool softener or laxative daily is recommended.   8. For additional questions or concerns, please do not hesitate to call the office. If after hours there is an answering service to forward your concerns to the physician on call.  9.Pain control following an exparel block  To help control your post-operative pain you received a nerve block  performed with Exparel which is a long acting anesthetic (numbing agent) which can provide pain relief and sensations of numbness (and relief of pain) in the operative shoulder and arm for up to 3 days. Sometimes it provides mixed relief, meaning you may still have numbness in certain areas of the arm but can still be able to move  parts of that arm, hand, and fingers. We recommend that your prescribed pain medications  be used as needed. We do not feel it is necessary to "pre medicate" and "stay ahead" of pain.  Taking narcotic pain medications when you are not having any pain can lead to unnecessary and potentially dangerous side effects.    10. TAKE 3 OTC IBUPROFEN WITH 1 TYLENOL 3 Times daily as discussed  POST-OP EXERCISES  Pendulum Exercises  Perform pendulum exercises while standing and bending at the waist. Support your uninvolved arm on a table or chair and allow your operated arm to hang freely. Make sure to do these exercises passively - not using you shoulder muscles.  Repeat 20 times. Do 3 sessions per day.

## 2018-06-15 NOTE — Plan of Care (Signed)
Pt is stable. Plane of care reviewed with Pt.

## 2018-06-18 ENCOUNTER — Encounter (HOSPITAL_COMMUNITY): Payer: Self-pay | Admitting: Orthopedic Surgery

## 2018-06-27 DIAGNOSIS — Z4789 Encounter for other orthopedic aftercare: Secondary | ICD-10-CM | POA: Diagnosis not present

## 2018-06-27 DIAGNOSIS — Z96612 Presence of left artificial shoulder joint: Secondary | ICD-10-CM | POA: Diagnosis not present

## 2018-07-06 DIAGNOSIS — M25612 Stiffness of left shoulder, not elsewhere classified: Secondary | ICD-10-CM | POA: Diagnosis not present

## 2018-07-06 DIAGNOSIS — M25512 Pain in left shoulder: Secondary | ICD-10-CM | POA: Diagnosis not present

## 2018-07-11 DIAGNOSIS — M25612 Stiffness of left shoulder, not elsewhere classified: Secondary | ICD-10-CM | POA: Diagnosis not present

## 2018-07-11 DIAGNOSIS — M25512 Pain in left shoulder: Secondary | ICD-10-CM | POA: Diagnosis not present

## 2018-07-13 DIAGNOSIS — M25512 Pain in left shoulder: Secondary | ICD-10-CM | POA: Diagnosis not present

## 2018-07-13 DIAGNOSIS — M25612 Stiffness of left shoulder, not elsewhere classified: Secondary | ICD-10-CM | POA: Diagnosis not present

## 2018-07-17 DIAGNOSIS — M25512 Pain in left shoulder: Secondary | ICD-10-CM | POA: Diagnosis not present

## 2018-07-19 DIAGNOSIS — M25512 Pain in left shoulder: Secondary | ICD-10-CM | POA: Diagnosis not present

## 2018-07-24 DIAGNOSIS — M25512 Pain in left shoulder: Secondary | ICD-10-CM | POA: Diagnosis not present

## 2018-07-26 DIAGNOSIS — M25512 Pain in left shoulder: Secondary | ICD-10-CM | POA: Diagnosis not present

## 2018-07-31 DIAGNOSIS — M25512 Pain in left shoulder: Secondary | ICD-10-CM | POA: Diagnosis not present

## 2018-08-01 ENCOUNTER — Telehealth: Payer: Self-pay | Admitting: Medical Oncology

## 2018-08-01 DIAGNOSIS — Z681 Body mass index (BMI) 19 or less, adult: Secondary | ICD-10-CM | POA: Diagnosis not present

## 2018-08-01 DIAGNOSIS — R590 Localized enlarged lymph nodes: Secondary | ICD-10-CM | POA: Diagnosis not present

## 2018-08-01 DIAGNOSIS — Z01419 Encounter for gynecological examination (general) (routine) without abnormal findings: Secondary | ICD-10-CM | POA: Diagnosis not present

## 2018-08-01 NOTE — Telephone Encounter (Signed)
Dr. Tressia Danas can arrange the biopsy if she felt it on her exam.  Thank you.

## 2018-08-01 NOTE — Telephone Encounter (Signed)
Enlarged left inguinal LN. Found by Dr Tressia Danas today . Request f/u with [facility] ? FNA/biopsy

## 2018-08-02 ENCOUNTER — Other Ambulatory Visit (HOSPITAL_COMMUNITY): Payer: Self-pay | Admitting: Obstetrics and Gynecology

## 2018-08-02 DIAGNOSIS — R590 Localized enlarged lymph nodes: Secondary | ICD-10-CM

## 2018-08-02 DIAGNOSIS — M25512 Pain in left shoulder: Secondary | ICD-10-CM | POA: Diagnosis not present

## 2018-08-02 NOTE — Telephone Encounter (Signed)
Spoke with Dr. Helane Rima ans she will arrange FNA of inguinal lymph node and have report send to Dr. Julien Nordmann as well as herself.

## 2018-08-03 ENCOUNTER — Other Ambulatory Visit (HOSPITAL_COMMUNITY): Payer: Self-pay | Admitting: Obstetrics and Gynecology

## 2018-08-03 DIAGNOSIS — R599 Enlarged lymph nodes, unspecified: Secondary | ICD-10-CM

## 2018-08-03 DIAGNOSIS — R1909 Other intra-abdominal and pelvic swelling, mass and lump: Secondary | ICD-10-CM

## 2018-08-03 DIAGNOSIS — R9389 Abnormal findings on diagnostic imaging of other specified body structures: Secondary | ICD-10-CM

## 2018-08-03 DIAGNOSIS — R590 Localized enlarged lymph nodes: Secondary | ICD-10-CM

## 2018-08-03 DIAGNOSIS — R918 Other nonspecific abnormal finding of lung field: Secondary | ICD-10-CM

## 2018-08-07 DIAGNOSIS — M25512 Pain in left shoulder: Secondary | ICD-10-CM | POA: Diagnosis not present

## 2018-08-09 DIAGNOSIS — M25512 Pain in left shoulder: Secondary | ICD-10-CM | POA: Diagnosis not present

## 2018-08-14 DIAGNOSIS — M25512 Pain in left shoulder: Secondary | ICD-10-CM | POA: Diagnosis not present

## 2018-08-15 ENCOUNTER — Other Ambulatory Visit: Payer: Self-pay | Admitting: Radiology

## 2018-08-15 ENCOUNTER — Ambulatory Visit (HOSPITAL_COMMUNITY)
Admission: RE | Admit: 2018-08-15 | Discharge: 2018-08-15 | Disposition: A | Discharge status: Home / Self Care | Payer: Medicare Other | Source: Ambulatory Visit | Attending: Obstetrics and Gynecology | Admitting: Obstetrics and Gynecology

## 2018-08-15 DIAGNOSIS — R1909 Other intra-abdominal and pelvic swelling, mass and lump: Secondary | ICD-10-CM | POA: Insufficient documentation

## 2018-08-15 DIAGNOSIS — R590 Localized enlarged lymph nodes: Secondary | ICD-10-CM | POA: Insufficient documentation

## 2018-08-15 DIAGNOSIS — R918 Other nonspecific abnormal finding of lung field: Secondary | ICD-10-CM | POA: Diagnosis not present

## 2018-08-15 DIAGNOSIS — Z85118 Personal history of other malignant neoplasm of bronchus and lung: Secondary | ICD-10-CM | POA: Diagnosis not present

## 2018-08-16 ENCOUNTER — Ambulatory Visit (HOSPITAL_COMMUNITY): Admission: RE | Admit: 2018-08-16 | Payer: Medicare Other | Source: Ambulatory Visit

## 2018-08-16 ENCOUNTER — Ambulatory Visit (HOSPITAL_COMMUNITY): Payer: Medicare Other

## 2018-08-16 ENCOUNTER — Encounter (HOSPITAL_COMMUNITY): Payer: Self-pay

## 2018-08-16 DIAGNOSIS — M25512 Pain in left shoulder: Secondary | ICD-10-CM | POA: Diagnosis not present

## 2018-08-17 ENCOUNTER — Encounter: Payer: Self-pay | Admitting: Internal Medicine

## 2018-08-20 ENCOUNTER — Encounter: Payer: Self-pay | Admitting: Medical Oncology

## 2018-08-21 DIAGNOSIS — M25512 Pain in left shoulder: Secondary | ICD-10-CM | POA: Diagnosis not present

## 2018-08-23 DIAGNOSIS — M25512 Pain in left shoulder: Secondary | ICD-10-CM | POA: Diagnosis not present

## 2018-10-05 ENCOUNTER — Encounter: Payer: Self-pay | Admitting: Internal Medicine

## 2018-10-05 ENCOUNTER — Other Ambulatory Visit: Payer: Self-pay | Admitting: Medical Oncology

## 2018-10-05 DIAGNOSIS — Z91041 Radiographic dye allergy status: Secondary | ICD-10-CM

## 2018-10-05 MED ORDER — PREDNISONE 50 MG PO TABS: ORAL_TABLET | ORAL | 3 refills | Status: DC

## 2018-10-10 ENCOUNTER — Encounter: Payer: Self-pay | Admitting: Internal Medicine

## 2018-10-16 ENCOUNTER — Ambulatory Visit: Payer: Medicare Other | Admitting: Internal Medicine

## 2018-10-29 ENCOUNTER — Other Ambulatory Visit: Payer: Medicare Other

## 2018-11-01 ENCOUNTER — Encounter: Payer: Self-pay | Admitting: Internal Medicine

## 2018-11-22 ENCOUNTER — Other Ambulatory Visit: Payer: Self-pay | Admitting: Family Medicine

## 2018-11-22 DIAGNOSIS — Z1231 Encounter for screening mammogram for malignant neoplasm of breast: Secondary | ICD-10-CM

## 2018-11-26 ENCOUNTER — Encounter (HOSPITAL_COMMUNITY): Payer: Self-pay

## 2018-11-26 ENCOUNTER — Ambulatory Visit (HOSPITAL_COMMUNITY): Payer: Medicare Other

## 2018-11-26 ENCOUNTER — Ambulatory Visit (HOSPITAL_COMMUNITY)
Admission: RE | Admit: 2018-11-26 | Discharge: 2018-11-26 | Disposition: A | Discharge status: Home / Self Care | Payer: Medicare Other | Source: Ambulatory Visit | Attending: Internal Medicine | Admitting: Internal Medicine

## 2018-11-26 ENCOUNTER — Other Ambulatory Visit: Payer: Self-pay

## 2018-11-26 ENCOUNTER — Inpatient Hospital Stay: Payer: Medicare Other | Attending: Internal Medicine

## 2018-11-26 DIAGNOSIS — R918 Other nonspecific abnormal finding of lung field: Secondary | ICD-10-CM | POA: Insufficient documentation

## 2018-11-26 DIAGNOSIS — C3411 Malignant neoplasm of upper lobe, right bronchus or lung: Secondary | ICD-10-CM | POA: Insufficient documentation

## 2018-11-26 DIAGNOSIS — C3490 Malignant neoplasm of unspecified part of unspecified bronchus or lung: Secondary | ICD-10-CM | POA: Diagnosis not present

## 2018-11-26 LAB — CBC WITH DIFFERENTIAL (CANCER CENTER ONLY)
Abs Immature Granulocytes: 0.03 10*3/uL (ref 0.00–0.07)
Basophils Absolute: 0 10*3/uL (ref 0.0–0.1)
Basophils Relative: 0 %
Eosinophils Absolute: 0 10*3/uL (ref 0.0–0.5)
Eosinophils Relative: 0 %
HCT: 47.4 % — ABNORMAL HIGH (ref 36.0–46.0)
Hemoglobin: 15.7 g/dL — ABNORMAL HIGH (ref 12.0–15.0)
Immature Granulocytes: 0 %
Lymphocytes Relative: 7 %
Lymphs Abs: 0.6 10*3/uL — ABNORMAL LOW (ref 0.7–4.0)
MCH: 30.5 pg (ref 26.0–34.0)
MCHC: 33.1 g/dL (ref 30.0–36.0)
MCV: 92 fL (ref 80.0–100.0)
Monocytes Absolute: 0.1 10*3/uL (ref 0.1–1.0)
Monocytes Relative: 1 %
Neutro Abs: 7.7 10*3/uL (ref 1.7–7.7)
Neutrophils Relative %: 92 %
Platelet Count: 234 10*3/uL (ref 150–400)
RBC: 5.15 MIL/uL — ABNORMAL HIGH (ref 3.87–5.11)
RDW: 13 % (ref 11.5–15.5)
WBC Count: 8.4 10*3/uL (ref 4.0–10.5)
nRBC: 0 % (ref 0.0–0.2)

## 2018-11-26 LAB — CMP (CANCER CENTER ONLY)
ALT: 21 U/L (ref 0–44)
AST: 23 U/L (ref 15–41)
Albumin: 4.7 g/dL (ref 3.5–5.0)
Alkaline Phosphatase: 70 U/L (ref 38–126)
Anion gap: 11 (ref 5–15)
BUN: 18 mg/dL (ref 8–23)
CO2: 24 mmol/L (ref 22–32)
Calcium: 9.9 mg/dL (ref 8.9–10.3)
Chloride: 103 mmol/L (ref 98–111)
Creatinine: 0.93 mg/dL (ref 0.44–1.00)
GFR, Est AFR Am: >60 mL/min (ref >60)
GFR, Estimated: >60 mL/min (ref >60)
Glucose, Bld: 151 mg/dL — ABNORMAL HIGH (ref 70–99)
Potassium: 4.5 mmol/L (ref 3.5–5.1)
Sodium: 138 mmol/L (ref 135–145)
Total Bilirubin: 0.5 mg/dL (ref 0.3–1.2)
Total Protein: 7.5 g/dL (ref 6.5–8.1)

## 2018-11-26 MED ORDER — IOHEXOL 300 MG/ML  SOLN
75.0000 mL | Freq: Once | INTRAMUSCULAR | Status: AC | PRN
  Administered 2018-11-26: 75 mL via INTRAVENOUS

## 2018-11-26 MED ORDER — SODIUM CHLORIDE (PF) 0.9 % IJ SOLN
INTRAMUSCULAR | Status: AC
  Filled 2018-11-26: qty 50

## 2018-11-28 ENCOUNTER — Encounter: Payer: Self-pay | Admitting: Internal Medicine

## 2018-11-28 ENCOUNTER — Other Ambulatory Visit: Payer: Self-pay

## 2018-11-28 ENCOUNTER — Inpatient Hospital Stay (HOSPITAL_BASED_OUTPATIENT_CLINIC_OR_DEPARTMENT_OTHER): Payer: Medicare Other | Admitting: Internal Medicine

## 2018-11-28 VITALS — BP 125/82 | HR 88 | Temp 98.2°F | Resp 18 | Ht 59.0 in | Wt 92.7 lb

## 2018-11-28 DIAGNOSIS — Z5111 Encounter for antineoplastic chemotherapy: Secondary | ICD-10-CM

## 2018-11-28 DIAGNOSIS — C3491 Malignant neoplasm of unspecified part of right bronchus or lung: Secondary | ICD-10-CM

## 2018-11-28 DIAGNOSIS — C3411 Malignant neoplasm of upper lobe, right bronchus or lung: Secondary | ICD-10-CM | POA: Diagnosis not present

## 2018-11-28 DIAGNOSIS — R918 Other nonspecific abnormal finding of lung field: Secondary | ICD-10-CM | POA: Diagnosis not present

## 2018-11-28 MED ORDER — OSIMERTINIB MESYLATE 80 MG PO TABS: 80.0000 mg | ORAL_TABLET | Freq: Every day | ORAL | 2 refills | Status: DC

## 2018-11-28 NOTE — Progress Notes (Signed)
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  PRIOR THERAPY: 1) Right VATS with right upper lobectomy with bronchoplasty closure and en bloc resection of a wedge from the superior segment of the right lower lobe in addition to mediastinal lymph node dissection on 07/30/2015 under the care of Dr. Roxan Hockey. 2) Adjuvant systemic chemotherapy with cisplatin 75 MG/M2 and Alimta 500 MG/M2 every 3 weeks. First dose was given on 09/10/2015. Status post 4 cycles. 3) adjuvant radiotherapy under the care of Dr. Tammi Klippel.  CURRENT THERAPY: Observation.  INTERVAL HISTORY: [name] West 68 y.o. female returns to the clinic today for follow-up visit.  The patient is feeling fine today with no concerning complaints.  She is a little bit anxious with the current COVID-19 situation and concern about infection.  She denied having any chest pain, shortness of breath, cough or hemoptysis.  She denied having any fever or chills.  She has no nausea, vomiting, diarrhea or constipation.  She denied having any headache or visual changes.  The patient has no recent weight loss or night sweats.  She had repeat CT scan of the chest performed recently and she is here for evaluation and discussion of her scan results.  MEDICAL HISTORY: Past Medical History:  Diagnosis Date   Anxiety    Arthritis    shoulders   Cancer ([place]) 06/2014   Lung Cancer   Complication of anesthesia    nausea, does well with PROPOFOL    Cough 09/17/2015   Encounter for antineoplastic chemotherapy 10/22/2015    Environmental and seasonal allergies    Family history of adverse reaction to anesthesia    Patients grandmother died while having hand surgery; pt unsure of cause   Oral thrush 10/22/2015   Ovarian cyst    PONV (postoperative nausea and vomiting)    Raynaud disease     ALLERGIES:  is allergic to avelox [moxifloxacin hcl in nacl]; cephalosporins; clindamycin/lincomycin; penicillins; macrobid [nitrofurantoin monohyd macro]; omnipaque [iohexol]; ciprofloxacin; epinephrine; adhesive [tape]; doxycycline; lidoderm [lidocaine]; pseudoephedrine; and sulfa antibiotics.  MEDICATIONS:  Current Outpatient Medications  Medication Sig Dispense Refill   Azelaic Acid (FINACEA) 15 % cream Apply 1 application topically daily. After skin is thoroughly washed and patted dry, gently but thoroughly massage a thin film of azelaic acid cream into the affected area twice daily, in the morning and evening.      cholecalciferol (VITAMIN D) 1000 UNITS tablet Take 1,000 Units by mouth daily.     citalopram (CELEXA) 10 MG tablet Take 10 mg by mouth daily.     cyclobenzaprine (FLEXERIL) 10 MG tablet Take 1 tablet (10 mg total) by mouth 3 (three) times daily as needed for muscle spasms. (Patient not taking: Reported on 08/13/2018) 30 tablet 1   EPINEPHrine 0.3 mg/0.3 mL IJ SOAJ injection Inject 0.3 mg into the muscle once.      fexofenadine (ALLEGRA) 180 MG tablet Take 180 mg by mouth daily.     ibuprofen (ADVIL,MOTRIN) 200 MG tablet Take 200 mg by mouth every 6 (six) hours as needed (pain).     LORazepam (ATIVAN) 0.5 MG tablet Take 0.5 mg  by mouth at bedtime as needed for anxiety or sleep.      Multiple Vitamin (MULTIVITAMIN WITH MINERALS) TABS Take 1 tablet by mouth daily.     NONFORMULARY OR COMPOUNDED ITEM Place 1 application vaginally 2 (two) times a week. Estradiol 0.02% Cream     omeprazole (PRILOSEC OTC) 20 MG tablet Take 20 mg by mouth daily as needed (acid reflux).      ondansetron (ZOFRAN) 4 MG  tablet Take 1 tablet (4 mg total) by mouth every 8 (eight) hours as needed for nausea or vomiting. (Patient not taking: Reported on 08/13/2018) 10 tablet 0   oxyCODONE-acetaminophen (PERCOCET) 5-325 MG tablet Take 1 tablet by mouth every 4 (four) hours as needed (max 6 q). (Patient not taking: Reported on 08/13/2018) 30 tablet 0   predniSONE (DELTASONE) 50 MG tablet Take one tablet 13 hours , one tablet 7 hours and 1 tablet 1 hours prior to CT scan for CT dye allergy. 3 tablet 3   triamcinolone (NASACORT ALLERGY 24HR) 55 MCG/ACT AERO nasal inhaler Place 1 spray into the nose daily as needed (for congestion).     Wheat Dextrin (BENEFIBER) POWD Take 1 Dose by mouth daily.     No current facility-administered medications for this visit.     SURGICAL HISTORY:  Past Surgical History:  Procedure Laterality Date   BREAST EXCISIONAL BIOPSY Left    [place]   COLONOSCOPY WITH PROPOFOL N/A 09/27/2012   Procedure: COLONOSCOPY WITH PROPOFOL;  Surgeon: Garlan Fair, MD;  Location: [facility];  Service: Endoscopy;  Laterality: N/A;   Fibroid Removed     LAPAROTOMY Left 07/20/2016   Procedure: MINI LAPAROTOMY, REMOVAL OF LEFT OVARIAN CYST, LEFT SALPINGO-OOPHORECTOMY;  Surgeon: Dian Queen, MD;  Location: [facility];  Service: Gynecology;  Laterality: Left;  please moved to 7:30am if spot opens   Right Rotator Cuff Right    TOTAL SHOULDER ARTHROPLASTY Left 06/14/2018   Procedure: TOTAL SHOULDER ARTHROPLASTY;  Surgeon: Justice Britain, MD;  Location: [facility];  Service: Orthopedics;  Laterality: Left;  139mn   VIDEO ASSISTED THORACOSCOPY (VATS)/ LOBECTOMY Right 07/30/2015   Procedure: RIGHT VIDEO ASSISTED THORACOSCOPY (VATS)/RIGHT UPPER LOBECTOMY;  Surgeon: SMelrose Nakayama MD;  [facility]  Service: Thoracic;  Laterality: Right;   VIDEO BRONCHOSCOPY WITH ENDOBRONCHIAL NAVIGATION N/A 07/15/2015   Procedure: VIDEO BRONCHOSCOPY WITH ENDOBRONCHIAL NAVIGATION with Biopsy;   Surgeon: RCollene Gobble MD;  [facility]  Service: Thoracic;  Laterality: N/A;    REVIEW OF SYSTEMS:  Constitutional: negative Eyes: negative Ears, nose, mouth, throat, and face: negative Respiratory: negative Cardiovascular: negative Gastrointestinal: negative Genitourinary:negative Integument/breast: negative Hematologic/lymphatic: negative Musculoskeletal:negative Neurological: negative Behavioral/Psych: negative Endocrine: negative Allergic/Immunologic: negative   PHYSICAL EXAMINATION: General appearance: alert, cooperative and no distress Head: Normocephalic, without obvious abnormality, atraumatic Neck: no adenopathy, no JVD, supple, symmetrical, trachea midline and thyroid not enlarged, symmetric, no tenderness/mass/nodules Lymph nodes: Cervical, supraclavicular, and axillary nodes normal. Resp: clear to auscultation bilaterally Back: symmetric, no curvature. ROM normal. No CVA tenderness. Cardio: regular rate and rhythm, S1, S2 normal, no murmur, click, rub or gallop GI: soft, non-tender; bowel sounds normal; no masses,  no organomegaly Extremities: extremities normal, atraumatic, no cyanosis or edema Neurologic: Alert and oriented X 3, normal strength and tone. Normal symmetric reflexes. Normal coordination and gait  ECOG PERFORMANCE STATUS: 0 - Asymptomatic  Blood pressure 125/82, pulse 88, temperature 98.2 F (36.8 C), temperature source Oral, resp. rate 18, height _0  (1.499 m),  weight 92 lb 11.2 oz (42 kg), SpO2 100 %.  LABORATORY DATA: Lab Results  Component Value Date   WBC 8.4 11/26/2018   HGB 15.7 (H) 11/26/2018   HCT 47.4 (H) 11/26/2018   MCV 92.0 11/26/2018   PLT 234 11/26/2018      Chemistry      Component Value Date/Time   NA 138 11/26/2018 1032   NA 139 06/16/2017 1132   K 4.5 11/26/2018 1032   K 4.7 06/16/2017 1132   CL 103 11/26/2018 1032   CO2 24 11/26/2018 1032   CO2 28 06/16/2017 1132   BUN 18 11/26/2018 1032   BUN 19.5  06/16/2017 1132   CREATININE 0.93 11/26/2018 1032   CREATININE 0.8 06/16/2017 1132      Component Value Date/Time   CALCIUM 9.9 11/26/2018 1032   CALCIUM 10.0 06/16/2017 1132   ALKPHOS 70 11/26/2018 1032   ALKPHOS 68 06/16/2017 1132   AST 23 11/26/2018 1032   AST 21 06/16/2017 1132   ALT 21 11/26/2018 1032   ALT 18 06/16/2017 1132   BILITOT 0.5 11/26/2018 1032   BILITOT 0.50 06/16/2017 1132       RADIOGRAPHIC STUDIES: Ct Chest W Contrast  Result Date: 11/26/2018 CLINICAL DATA:  Non-small cell lung cancer. EXAM: CT CHEST WITH CONTRAST TECHNIQUE: Multidetector CT imaging of the chest was performed during intravenous contrast administration. CONTRAST:  38m OMNIPAQUE IOHEXOL 300 MG/ML  SOLN COMPARISON:  05/29/2018 FINDINGS: Cardiovascular: Normal heart size.  Aortic atherosclerosis. Mediastinum/Nodes: No enlarged mediastinal, hilar, or axillary lymph nodes. Thyroid gland, trachea, and esophagus demonstrate no significant findings. Lungs/Pleura: Status post right upper lobectomy. Status post superior segmentectomy of the right lower lobe. Scarring within the right upper lung zone is again noted. Small scattered bilateral pulmonary nodules are identified throughout both lungs which appear too numerous to count. Index nodule in the anterior left upper lobe measures 6 mm, image 63/7. Previously 4 mm. The index nodule within the left lower lobe measures 5 mm, image 80/7. On 06/16/2017 this nodule measured 1-2 mm. On 06/16/2017 this nodule measured 3 mm. Within the lateral left lower lobe there is a nodule measuring 6 mm, image 79/7. Previously this measured 4 mm. On 06/16/2017 this nodule measured 3 mm. Posterolateral right lower lobe lung nodule measures 5 mm, image 78/7. Previously 3 mm. New from 06/16/2017. Upper Abdomen: No acute abnormality. Musculoskeletal: No chest wall abnormality. No acute or significant osseous findings. IMPRESSION: 1. Multiple tiny nodules are again noted scattered  throughout both lungs. Nodules are too numerous to count. Compared with 05/29/2018 some of these nodules are stable and others demonstrate mild increase in size in the interval. When compared with 06/16/2017 a number of these nodules have clearly increased in size from previous exam and are therefore suspicious. Electronically Signed   By: TKerby MoorsM.D.   On: 11/26/2018 14:26    ASSESSMENT AND PLAN:  This is a very pleasant 68years old white female with stage IIIA non-small cell lung cancer, adenocarcinoma with positive EGFR mutation in exon 19 status post right upper lobectomy with lymph node dissection followed by adjuvant systemic chemotherapy as well as adjuvant radiation. The patient has been on observation for almost 3 years now.  She is feeling fine with no concerning complaints.  She has repeat CT scan of the chest performed recently.  I personally and independently reviewed the scans images and discussed the results and showed the images to the patient today. Unfortunately her scan showed multiple tiny  pulmonary nodules scattered throughout both lungs which many of them have increased in size and some new nodule suspected the previous scan. I had a lengthy discussion with the patient today about his current condition and treatment options. I discussed with the patient the option of continuous observation and close monitoring of the pulmonary nodules versus starting her on treatment with targeted therapy with Tagrisso 80 mg p.o. daily.  I discussed with the patient exacerbated the effect of the treatment with Tagrisso including but not limited to skin rash, diarrhea, interstitial lung disease, cardiac electrolyte abnormalities. The patient requested some time to think about her options but she called later today and she is interested in proceeding with the treatment with Tagrisso. We will arrange for her to have an EKG before starting the first dose of her treatment. I will send her order  to [facility] long outpatient pharmacy. She will come back for follow-up visit in 2-3 weeks for evaluation and repeat blood work for close monitoring of her treatment and management of the adverse effects.  The patient was advised to call immediately if she has any concerning symptoms in the interval. The patient voices understanding of current disease status and treatment options and is in agreement with the current care plan. All questions were answered. The patient knows to call the clinic with any problems, questions or concerns. We can certainly see the patient much sooner if necessary. I spent 15 minutes counseling the patient face to face. The total time spent in the appointment was 25 minutes. Disclaimer: This note was dictated with voice recognition software. Similar sounding words can inadvertently be transcribed and may not be corrected upon review.

## 2018-11-29 ENCOUNTER — Telehealth: Payer: Self-pay | Admitting: Pharmacist

## 2018-11-29 ENCOUNTER — Telehealth: Payer: Self-pay | Admitting: Pharmacy Technician

## 2018-11-29 DIAGNOSIS — C3491 Malignant neoplasm of unspecified part of right bronchus or lung: Secondary | ICD-10-CM

## 2018-11-29 NOTE — Telephone Encounter (Signed)
Oral Oncology Pharmacist Encounter  Received new prescription for Tagrisso (osimertinib) for the treatment of recurrent non small cell lung cancer, positive for EGFR mutation positive with deletion in exon 19, planned duration until disease progression or unacceptable toxicity.  Tagrisso is planned to be administered at 3m once daily  Labs from 11/26/2018 assessed, OK for treatment initiation. Last EKG performed 03/27/18 shows QTc at 417 msec This will be repeated prior to Tagrisso initiation  Patient with coronary artery calcification on PMH list, no other cardiac history is noted  Current medication list in Epic reviewed, moderate DDI with Tagrisso and Celexa identified identified:  Category C interaction: both agents with ability to prolong QTc interval. 2019 EKG shows QTc interval safe for Tagrisso initiation. EKG to be repeated prior to Tagrisso initiation. Electrolytes will be monitored while on therapy.  [facility] on 11/28/18 by MD.  Oral Oncology Clinic will continue to follow for insurance authorization, copayment issues, initial counseling and start date.  JJohny Drilling PharmD, BCPS, BCOP  11/29/2018 8:11 AM Oral Oncology Clinic [phone]

## 2018-11-29 NOTE — Telephone Encounter (Signed)
Received a Prior Authorization request from [employer] for [place] 80mg  Tablets. Authorization has been submitted to patient's insurance via Cover My Meds. Will update once we receive a response.  KEY- [id number]  9:32 AM Beatriz Chancellor, CPhT

## 2018-11-29 NOTE — Telephone Encounter (Signed)
Oral Chemotherapy Pharmacist Encounter   I spoke with patient for overview of: Tagrisso (osimertinib) for the treatment of recurrent non small cell lung cancer, positive for EGFR mutation positive with deletion in exon 19, planned duration until disease progression or unacceptable toxicity.   Counseled patient on administration, dosing, side effects, monitoring, drug-food interactions, safe handling, storage, and disposal.  Patient will take Tagrisso 80 tablets, 1 tablet by mouth once daily, without regard to food.  Tagrisso start date: 12/01/2018  Adverse effects include but are not limited to: diarrhea, mouth sores, decreased appetitie, fatigue, dry skin, rash, nail changes, altered cardiac conduction, and decreased blood counts or electrolytes.  Patient will obtain anti diarrheal and alert the office of 4 or more loose stools above baseline.   Patient has severe allergic reactions to normal medications used to treat skin rash if it occurs. I will plan to research alternate treatment agents for skin rash in an effort to have a recommendation readily available if this adverse event is experienced.  Reviewed with patient importance of keeping a medication schedule and plan for any missed doses.  Medication reconciliation performed and medication/allergy list updated.  Insurance authorization for Newman Nip has been obtained. Test claim at the pharmacy revealed copayment ~$2900 for 1st fill of Tagrisso.  We extensively discussed medicare copayment structure including initial coverage, the donut hole, and catastrophic coverage. Patient informed 1 fill of Tagrisso will likely push her into catastrophic coverage for the remainder of 2020 and copayment will likely reduce to $700-$800 per month for the rest of this year.  Patient is over-income for all forms of copayment assistance including foundation copayment grants or manufacturer compassionate use programs. She will pay her copayment  She  will pick up her 1st fill of Tagrisso from the [facility] on 11/30/2018 after EKG appointment.  Patient informed the pharmacy will reach out 5-7 days prior to needing next fill of Tagrisso to coordinate continued medication acquisition to prevent break in therapy.  We extensively discussed benefit of EGFR directed therapy and origination of EGFR mutation. Patient requests any available clinical information on the use of this agent in patients previously treated for lower stage disease. This information, plus written medication information, will be emailed to patient.  All questions answered.  Mrs. Fenech voiced understanding and appreciation.   Patient knows to call the office with questions or concerns.  Johny Drilling, PharmD, BCPS, BCOP  11/29/2018   11:14 AM Oral Oncology Clinic [phone]

## 2018-11-30 ENCOUNTER — Telehealth: Payer: Self-pay | Admitting: Internal Medicine

## 2018-11-30 ENCOUNTER — Inpatient Hospital Stay: Payer: Medicare Other

## 2018-11-30 ENCOUNTER — Encounter: Payer: Self-pay | Admitting: Internal Medicine

## 2018-11-30 ENCOUNTER — Other Ambulatory Visit: Payer: Self-pay

## 2018-11-30 DIAGNOSIS — R918 Other nonspecific abnormal finding of lung field: Secondary | ICD-10-CM | POA: Diagnosis not present

## 2018-11-30 DIAGNOSIS — C3411 Malignant neoplasm of upper lobe, right bronchus or lung: Secondary | ICD-10-CM | POA: Diagnosis not present

## 2018-11-30 MED FILL — TAGRISSO 80 MG TABLET: 80 | 30 days supply | Qty: 30 | Fill #0

## 2018-11-30 NOTE — Telephone Encounter (Signed)
Oral Oncology Patient Advocate Encounter  Prior Authorization for Newman Nip has been approved.    Effective dates: 08/31/18 through 11/28/21  Oral Oncology Clinic will continue to follow.   [place] Patient [place] Phone [phone] Fax [phone] 11/30/2018    9:05 AM

## 2018-11-30 NOTE — Telephone Encounter (Signed)
No los per 6/17.

## 2018-12-03 ENCOUNTER — Telehealth: Payer: Self-pay | Admitting: Internal Medicine

## 2018-12-03 NOTE — Telephone Encounter (Signed)
Oral Oncology Patient Advocate Encounter  Confirmed with [place] that Newman Nip was picked up on 11/30/18 with a $2948.01 copay. Patient stated her income was to high for assistance.   [place] Patient [place] Phone [phone] Fax [phone] 12/03/2018   11:02 AM

## 2018-12-03 NOTE — Telephone Encounter (Signed)
I talk with patient regarding schedule  

## 2018-12-04 ENCOUNTER — Telehealth: Payer: Self-pay | Admitting: Pharmacist

## 2018-12-04 NOTE — Telephone Encounter (Signed)
Oral Chemotherapy Pharmacist Encounter   Spoke with patient today to follow up regarding patient's oral chemotherapy medication: Tagrisso (osimertinib) for the treatment of recurrent non small cell lung cancer, positive for EGFR mutation positive with deletion in exon 19, planned duration until disease progression or unacceptable toxicity.  Original Start date of oral chemotherapy: 12/01/2018  Pt reports 0 tablets/doses of Tagrisso 80 tablets, 1 tablet by mouth once daily, without regard to food missed in the last 4 days.   Pt reports the following side effects: Possible increase in fatigue, mild at most.  Patient states she is sleeping very well.  Pertinent labs reviewed: EKG performed on 11/30/2018 shows QTc at 412 msec.  This is safe for Tagrisso initiation.  Patient with questions about whether or not she should obtain Pedialyte or Gatorade for electrolyte replacement in case she has uncontrollable diarrhea. She also questions whether or not she should get some depends adult diapers for when she takes long walks in case the diarrhea cannot be controlled. Patient informed that she is welcome to get 1 bottle of Pedialyte and may be a few adult diapers, however, uncontrolled diarrhea is not something we normally experience with the [place].  Intermittent loose stools or increase in frequency of stools has been noted, however, this is been controllable in patients that have been treated at our office. Patient informed that it is safe for her to go to the dentist and her endondist without stopping Tagrisso as we do not anticipate neutropenia or an increase in bleeding issues with the use of Tagrisso. Other scans or office visits are also safe during the time of [place] therapy  Confirmed office visits with patient scheduled for 12/17/2018.  All questions answered. Patient knows to call the office with questions or concerns.  Johny Drilling, PharmD, BCPS, BCOP  12/04/2018 10:36 AM Oral Oncology  Clinic [phone]

## 2018-12-10 ENCOUNTER — Encounter: Payer: Self-pay | Admitting: Internal Medicine

## 2018-12-10 DIAGNOSIS — Z96612 Presence of left artificial shoulder joint: Secondary | ICD-10-CM | POA: Diagnosis not present

## 2018-12-10 DIAGNOSIS — Z4789 Encounter for other orthopedic aftercare: Secondary | ICD-10-CM | POA: Diagnosis not present

## 2018-12-12 ENCOUNTER — Encounter: Payer: Self-pay | Admitting: Internal Medicine

## 2018-12-13 ENCOUNTER — Other Ambulatory Visit: Payer: Self-pay | Admitting: *Deleted

## 2018-12-13 ENCOUNTER — Encounter: Payer: Self-pay | Admitting: Internal Medicine

## 2018-12-13 ENCOUNTER — Telehealth: Payer: Self-pay | Admitting: Pharmacist

## 2018-12-13 ENCOUNTER — Inpatient Hospital Stay: Payer: Medicare Other

## 2018-12-13 ENCOUNTER — Inpatient Hospital Stay: Payer: Medicare Other | Attending: Internal Medicine | Admitting: Medical

## 2018-12-13 ENCOUNTER — Other Ambulatory Visit: Payer: Self-pay

## 2018-12-13 VITALS — BP 133/74 | HR 70 | Temp 99.1°F | Resp 17 | Ht 59.0 in | Wt 91.4 lb

## 2018-12-13 DIAGNOSIS — R21 Rash and other nonspecific skin eruption: Secondary | ICD-10-CM | POA: Diagnosis not present

## 2018-12-13 DIAGNOSIS — C3411 Malignant neoplasm of upper lobe, right bronchus or lung: Secondary | ICD-10-CM | POA: Diagnosis not present

## 2018-12-13 DIAGNOSIS — R911 Solitary pulmonary nodule: Secondary | ICD-10-CM | POA: Insufficient documentation

## 2018-12-13 DIAGNOSIS — C3491 Malignant neoplasm of unspecified part of right bronchus or lung: Secondary | ICD-10-CM

## 2018-12-13 LAB — CMP (CANCER CENTER ONLY)
ALT: 18 U/L (ref 0–44)
AST: 19 U/L (ref 15–41)
Albumin: 4 g/dL (ref 3.5–5.0)
Alkaline Phosphatase: 62 U/L (ref 38–126)
Anion gap: 9 (ref 5–15)
BUN: 16 mg/dL (ref 8–23)
CO2: 30 mmol/L (ref 22–32)
Calcium: 9.1 mg/dL (ref 8.9–10.3)
Chloride: 100 mmol/L (ref 98–111)
Creatinine: 0.94 mg/dL (ref 0.44–1.00)
GFR, Est AFR Am: >60 mL/min (ref >60)
GFR, Estimated: >60 mL/min (ref >60)
Glucose, Bld: 112 mg/dL — ABNORMAL HIGH (ref 70–99)
Potassium: 4.3 mmol/L (ref 3.5–5.1)
Sodium: 139 mmol/L (ref 135–145)
Total Bilirubin: 0.4 mg/dL (ref 0.3–1.2)
Total Protein: 6.7 g/dL (ref 6.5–8.1)

## 2018-12-13 LAB — CBC WITH DIFFERENTIAL (CANCER CENTER ONLY)
Abs Immature Granulocytes: 0.03 10*3/uL (ref 0.00–0.07)
Basophils Absolute: 0 10*3/uL (ref 0.0–0.1)
Basophils Relative: 1 %
Eosinophils Absolute: 0.1 10*3/uL (ref 0.0–0.5)
Eosinophils Relative: 2 %
HCT: 41.5 % (ref 36.0–46.0)
Hemoglobin: 13.6 g/dL (ref 12.0–15.0)
Immature Granulocytes: 1 %
Lymphocytes Relative: 9 %
Lymphs Abs: 0.6 10*3/uL — ABNORMAL LOW (ref 0.7–4.0)
MCH: 30.2 pg (ref 26.0–34.0)
MCHC: 32.8 g/dL (ref 30.0–36.0)
MCV: 92.2 fL (ref 80.0–100.0)
Monocytes Absolute: 0.5 10*3/uL (ref 0.1–1.0)
Monocytes Relative: 8 %
Neutro Abs: 5.2 10*3/uL (ref 1.7–7.7)
Neutrophils Relative %: 79 %
Platelet Count: 168 10*3/uL (ref 150–400)
RBC: 4.5 MIL/uL (ref 3.87–5.11)
RDW: 13.2 % (ref 11.5–15.5)
WBC Count: 6.5 10*3/uL (ref 4.0–10.5)
nRBC: 0 % (ref 0.0–0.2)

## 2018-12-13 MED ORDER — METHYLPREDNISOLONE 4 MG PO TBPK: ORAL_TABLET | ORAL | 0 refills | Status: DC

## 2018-12-13 MED FILL — METHYLPREDNISOLONE 4 MG TBP: 4 | 21 days supply | Qty: 21 | Fill #0

## 2018-12-13 NOTE — Telephone Encounter (Signed)
Oral Oncology Pharmacist Encounter  Late entry: Received call and email from patient with questions about a rash that started on 12/11/2018. Patient states it started on her face in the cheek area then spread to her arms then spread to her trunk. Patient states she took 1 dose of diphenhydramine at approximately 3 AM on 12/12/2018. Patient stated that she had taken her Tagrisso on 12/12/2018. We discussed that her presentation is not the usual rash associated with Tagrisso administration, however, the timing does appear that it could be correlated to the [place]. Patient denies any other changes in food, lotions, or detergents. Patient instructed that she is able to take another dose of diphenhydramine and should hold her Tagrisso at this time. Due to extensiveness of rash patient was transferred to Dr. Worthy Flank collaborative practice RN for provider management.  Johny Drilling, PharmD, BCPS, BCOP  12/13/2018 8:14 AM Oral Oncology Clinic [phone]

## 2018-12-13 NOTE — Progress Notes (Signed)
Pt presents with rash across backs of arms and RUQ and LUQ of stomach.  On arms rash is bright red, unraised, and one large spot, while on the stomach the rash is dotted, unraised, and spread out.  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  Denies CP or SOB or trouble swallowing.  Speaks in full sentences.  No swelling present.  Afebrile.

## 2018-12-13 NOTE — Patient Instructions (Signed)
Rash, Adult  A rash is a change in the color of your skin. A rash can also change the way your skin feels. There are many different conditions and factors that can cause a rash. Follow these instructions at home: The goal of treatment is to stop the itching and keep the rash from spreading. Watch for any changes in your symptoms. Let your doctor know about them. Follow these instructions to help with your condition: Medicine Take or apply over-the-counter and prescription medicines only as told by your doctor. These may include medicines:  To treat red or swollen skin (corticosteroid creams).  To treat itching.  To treat an allergy (oral antihistamines).  To treat very bad symptoms (oral corticosteroids).  Skin care  Put cool cloths (compresses) on the affected areas.  Do not scratch or rub your skin.  Avoid covering the rash. Make sure that the rash is exposed to air as much as possible. Managing itching and discomfort  Avoid hot showers or baths. These can make itching worse. A cold shower may help.  Try taking a bath with: ? Epsom salts. You can get these at your local pharmacy or grocery store. Follow the instructions on the package. ? Baking soda. Pour a small amount into the bath as told by your doctor. ? Colloidal oatmeal. You can get this at your local pharmacy or grocery store. Follow the instructions on the package.  Try putting baking soda paste onto your skin. Stir water into baking soda until it gets like a paste.  Try putting on a lotion that relieves itchiness (calamine lotion).  Keep cool and out of the sun. Sweating and being hot can make itching worse. General instructions   Rest as needed.  Drink enough fluid to keep your pee (urine) pale yellow.  Wear loose-fitting clothing.  Avoid scented soaps, detergents, and perfumes. Use gentle soaps, detergents, perfumes, and other cosmetic products.  Avoid anything that causes your rash. Keep a journal to  help track what causes your rash. Write down: ? What you eat. ? What cosmetic products you use. ? What you drink. ? What you wear. This includes jewelry.  Keep all follow-up visits as told by your doctor. This is important. Contact a doctor if:  You sweat at night.  You lose weight.  You pee (urinate) more than normal.  You pee less than normal, or you notice that your pee is a darker color than normal.  You feel weak.  You throw up (vomit).  Your skin or the whites of your eyes look yellow (jaundice).  Your skin: ? Tingles. ? Is numb.  Your rash: ? Does not go away after a few days. ? Gets worse.  You are: ? More thirsty than normal. ? More tired than normal.  You have: ? New symptoms. ? Pain in your belly (abdomen). ? A fever. ? Watery poop (diarrhea). Get help right away if:  You have a fever and your symptoms suddenly get worse.  You start to feel mixed up (confused).  You have a very bad headache or a stiff neck.  You have very bad joint pains or stiffness.  You have jerky movements that you cannot control (seizure).  Your rash covers all or most of your body. The rash may or may not be painful.  You have blisters that: ? Are on top of the rash. ? Grow larger. ? Grow together. ? Are painful. ? Are inside your nose or mouth.  You have a rash  that: ? Looks like purple pinprick-sized spots all over your body. ? Has a "bull's eye" or looks like a target. ? Is red and painful, causes your skin to peel, and is not from being in the sun too long. Summary  A rash is a change in the color of your skin. A rash can also change the way your skin feels.  The goal of treatment is to stop the itching and keep the rash from spreading.  Take or apply over-the-counter and prescription medicines only as told by your doctor.  Contact a doctor if you have new symptoms or symptoms that get worse.  Keep all follow-up visits as told by your doctor. This is  important. This information is not intended to replace advice given to you by your health care provider. Make sure you discuss any questions you have with your health care provider. Document Released: 11/16/2007 Document Revised: 09/21/2018 Document Reviewed: 01/01/2018 Elsevier Patient Education  2020 [employer].

## 2018-12-13 NOTE — Progress Notes (Signed)
PT to be seen in [facility] today for rash.

## 2018-12-13 NOTE — Progress Notes (Signed)
[facility] Progress Note   [name] West [id number] [birth year] 68 y.o.  Hiram Gash is managed by Dr. Fanny Bien. Mohamed  Actively treated with chemotherapy/immunotherapy/hormonal therapy: yes  Current therapy: Tagrisso  Next scheduled appointment with provider: 12/17/2018  Assessment: Plan:    Rash over the bilateral upper extremities: Patient given Medrol Dosepak.  She was instructed to call should her rash not improve or worsen over the weekend at which time plans will be made to begin her on a course of doxycycline.  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 Julien Nordmann and is treated with Tagrisso.  She was told to restart Tagrisso tomorrow.  Please see After Visit Summary for patient specific instructions.  Future Appointments  Date Time Provider Clark  12/17/2018  9:45 AM [facility] LAB 1 [facility] None  12/17/2018 10:15 AM Curt Bears, MD [facility] None  01/28/2019  9:00 AM Hilty, Nadean Corwin, MD [facility]  02/04/2019  8:00 AM [facility] MM 3 [facility]  02/04/2019  8:30 AM [facility] DX DEXA 1 [facility]    No orders of the defined types were placed in this encounter.      Subjective:   Patient ID:  [name] West is a 68 y.o. (DOB Feb 15, 1951) female.  Chief Complaint:  Chief Complaint  Patient presents with  . Rash    HPI [name] West is a 68 year old female with a recurrent non-small cell lung cancer initially diagnosed as stage IIIA (T2a, N2, M0) non-small cell lung cancer, adenocarcinoma. She is managed by Dr. Julien Nordmann and is currently treated with Tagrisso. She is being seen today after contacted our pharmacy on 06/30 with a report that she had a rash that started on 12/11/2018. She stated that this rash started on her face in the cheek area then spread to her arms then spread to her trunk. She took 1 dose of diphenhydramine at approximately 3 AM on  12/12/2018. Patient denies any other changes in food, lotions, or detergents. She was told to take another dose of diphenhydramine and hold her Tagrisso until she was seen.  She reports that she has been working in her garden.  She also reports that she had been scratched by her cat and dog on her upper arms.  Medications: I have reviewed the patient's current medications.  Allergies:  Allergies  Allergen Reactions  . Avelox [Moxifloxacin Hcl In Nacl] Anaphylaxis  . Cephalosporins Anaphylaxis  . Clindamycin/Lincomycin Anaphylaxis  . Penicillins Anaphylaxis and Other (See Comments)    Has patient had a PCN reaction causing immediate rash, facial/tongue/throat swelling, SOB or lightheadedness with hypotension: Yes Has patient had a PCN reaction causing severe rash involving mucus membranes or skin necrosis: No Has patient had a PCN reaction that required hospitalization Yes Has patient had a PCN reaction occurring within the last 10 years: No If all of the above answers are "NO", then may proceed with Cephalosporin use.   Marland Kitchen Macrobid [Nitrofurantoin Monohyd Macro] Other (See Comments)    CHEST TIGHTNESS...Marland KitchenHAD USED BEFORE WITH NO REACTION  . Omnipaque [Iohexol] Hives  . Ciprofloxacin Other (See Comments)    Unknown  . Adhesive [Tape] Rash  . Doxycycline Other (See Comments)    Frequently urination   . Lidoderm [Lidocaine] Rash  . Pseudoephedrine Palpitations  . Sulfa Antibiotics Rash    Past Medical History:  Diagnosis Date  . Anxiety   . Arthritis    shoulders  .  Cancer ([place]) 06/2014   Lung Cancer  . Complication of anesthesia    nausea, does well with PROPOFOL   . Cough 09/17/2015  . Encounter for antineoplastic chemotherapy 10/22/2015  . Environmental and seasonal allergies   . Family history of adverse reaction to anesthesia    Patients grandmother died while having hand surgery; pt unsure of cause  . Oral thrush 10/22/2015  . Ovarian cyst   . PONV (postoperative nausea and  vomiting)   . Raynaud disease     Past Surgical History:  Procedure Laterality Date  . BREAST EXCISIONAL BIOPSY Left    1996  . CESAREAN SECTION  1984  . COLONOSCOPY WITH PROPOFOL N/A 09/27/2012   Procedure: COLONOSCOPY WITH PROPOFOL;  Surgeon: Garlan Fair, MD;  Location: [facility];  Service: Endoscopy;  Laterality: N/A;  . Fibroid Removed    . LAPAROTOMY Left 07/20/2016   Procedure: MINI LAPAROTOMY, REMOVAL OF LEFT OVARIAN CYST, LEFT SALPINGO-OOPHORECTOMY;  Surgeon: Dian Queen, MD;  Location: [facility];  Service: Gynecology;  Laterality: Left;  please moved to 7:30am if spot opens  . Right Rotator Cuff Right   . TOTAL SHOULDER ARTHROPLASTY Left 06/14/2018   Procedure: TOTAL SHOULDER ARTHROPLASTY;  Surgeon: Justice Britain, MD;  Location: [facility];  Service: Orthopedics;  Laterality: Left;  147mn  . VIDEO ASSISTED THORACOSCOPY (VATS)/ LOBECTOMY Right 07/30/2015   Procedure: RIGHT VIDEO ASSISTED THORACOSCOPY (VATS)/RIGHT UPPER LOBECTOMY;  Surgeon: SMelrose Nakayama MD;  [place] [facility]  Service: Thoracic;  Laterality: Right;  .Marland KitchenVIDEO BRONCHOSCOPY WITH ENDOBRONCHIAL NAVIGATION N/A 07/15/2015   Procedure: VIDEO BRONCHOSCOPY WITH ENDOBRONCHIAL NAVIGATION with Biopsy;  Surgeon: RCollene Gobble MD;  Location: [facility];  Service: Thoracic;  Laterality: N/A;    Family History  Problem Relation Age of Onset  . Heart disease Brother   . Heart attack Brother     Social History   Socioeconomic History  . Marital status: Married    Spouse name: Not on file  . Number of children: Not on file  . Years of education: Not on file  . Highest education level: Not on file  Occupational History  . Not on file  Social Needs  . Financial resource strain: Not on file  . Food insecurity    Worry: Not on file    Inability: Not on file  . Transportation needs    Medical: Not on file    Non-medical: Not on file  Tobacco Use  . Smoking status: Never Smoker  . Smokeless tobacco: Never Used   Substance and Sexual Activity  . Alcohol use: Yes    Comment: wine 1 glass per night  . Drug use: No  . Sexual activity: Yes  Lifestyle  . Physical activity    Days per week: Not on file    Minutes per session: Not on file  . Stress: Not on file  Relationships  . Social [place] phone: Not on file    Gets together: Not on file    Attends religious service: Not on file    Active member of club or organization: Not on file    Attends meetings of clubs or organizations: Not on file    Relationship status: Not on file  . Intimate partner violence    Fear of current or ex partner: Not on file    Emotionally abused: Not on file    Physically abused: Not on file    Forced sexual activity: Not on file  Other Topics Concern  . Not on file  Social History Narrative  . Not on file    Past Medical History, Surgical history, Social history, and Family history were reviewed and updated as appropriate.   Please see review of systems for further details on the patient's review from today.   Review of Systems:  Review of Systems  Constitutional: Negative for chills, diaphoresis and fever.  HENT: Negative for facial swelling and trouble swallowing.   Respiratory: Negative for cough, chest tightness and shortness of breath.   Cardiovascular: Negative for chest pain.  Skin: Positive for rash.    Objective:   Physical Exam:  BP 133/74 (BP Location: Left Arm, Patient Position: Sitting)   Pulse 70   Temp 99.1 F (37.3 C) (Oral)   Resp 17   Ht 4' 11"  (1.499 m)   Wt 91 lb 6.4 oz (41.5 kg)   SpO2 100%   BMI 18.46 kg/m  ECOG: 1  Physical Exam Constitutional:      General: She is not in acute distress.    Appearance: She is not diaphoretic.  HENT:     Head: Normocephalic and atraumatic.  Cardiovascular:     Rate and Rhythm: Normal rate and regular rhythm.     Heart sounds: Normal heart sounds. No murmur. No friction rub. No gallop.   Pulmonary:     Effort:  Pulmonary effort is normal. No respiratory distress.     Breath sounds: Normal breath sounds. No wheezing or rales.  Skin:    General: Skin is warm and dry.     Findings: Erythema and rash present.     Comments: Increased erythema and warmth over the posterior upper extremities bilaterally.  Each of the posterior upper extremities show single areas measuring 0.5 x 0.5 cm that appear to be pustules.  Patient has diffuse erythema over her cheeks.  She additionally has a diffuse erythematous slightly raised rash over her epigastrium and upper back.  Neurological:     Mental Status: She is alert.     Coordination: Coordination normal.     Gait: Gait normal.     Lab Review:     Component Value Date/Time   NA 139 12/13/2018 1339   NA 139 06/16/2017 1132   K 4.3 12/13/2018 1339   K 4.7 06/16/2017 1132   CL 100 12/13/2018 1339   CO2 30 12/13/2018 1339   CO2 28 06/16/2017 1132   GLUCOSE 112 (H) 12/13/2018 1339   GLUCOSE 93 06/16/2017 1132   BUN 16 12/13/2018 1339   BUN 19.5 06/16/2017 1132   CREATININE 0.94 12/13/2018 1339   CREATININE 0.8 06/16/2017 1132   CALCIUM 9.1 12/13/2018 1339   CALCIUM 10.0 06/16/2017 1132   PROT 6.7 12/13/2018 1339   PROT 7.3 06/16/2017 1132   ALBUMIN 4.0 12/13/2018 1339   ALBUMIN 4.5 06/16/2017 1132   AST 19 12/13/2018 1339   AST 21 06/16/2017 1132   ALT 18 12/13/2018 1339   ALT 18 06/16/2017 1132   ALKPHOS 62 12/13/2018 1339   ALKPHOS 68 06/16/2017 1132   BILITOT 0.4 12/13/2018 1339   BILITOT 0.50 06/16/2017 1132   GFRNONAA >60 12/13/2018 1339   GFRAA >60 12/13/2018 1339       Component Value Date/Time   WBC 6.5 12/13/2018 1339   WBC 10.1 06/16/2017 1132   WBC 9.1 06/16/2016 1457   RBC 4.50 12/13/2018 1339   HGB 13.6 12/13/2018 1339   HGB 15.0 06/16/2017 1132   HCT 41.5 12/13/2018 1339  HCT 44.1 06/16/2017 1132   PLT 168 12/13/2018 1339   PLT 258 06/16/2017 1132   MCV 92.2 12/13/2018 1339   MCV 91.3 06/16/2017 1132   MCH 30.2  12/13/2018 1339   MCHC 32.8 12/13/2018 1339   RDW 13.2 12/13/2018 1339   RDW 13.0 06/16/2017 1132   LYMPHSABS 0.6 (L) 12/13/2018 1339   LYMPHSABS 0.9 06/16/2017 1132   MONOABS 0.5 12/13/2018 1339   MONOABS 0.5 06/16/2017 1132   EOSABS 0.1 12/13/2018 1339   EOSABS 0.0 06/16/2017 1132   BASOSABS 0.0 12/13/2018 1339   BASOSABS 0.0 06/16/2017 1132   -------------------------------  Imaging from last 24 hours (if applicable):  Radiology interpretation: Ct Chest W Contrast  Result Date: 11/26/2018 CLINICAL DATA:  Non-small cell lung cancer. EXAM: CT CHEST WITH CONTRAST TECHNIQUE: Multidetector CT imaging of the chest was performed during intravenous contrast administration. CONTRAST:  40m OMNIPAQUE IOHEXOL 300 MG/ML  SOLN COMPARISON:  05/29/2018 FINDINGS: Cardiovascular: Normal heart size.  Aortic atherosclerosis. Mediastinum/Nodes: No enlarged mediastinal, hilar, or axillary lymph nodes. Thyroid gland, trachea, and esophagus demonstrate no significant findings. Lungs/Pleura: Status post right upper lobectomy. Status post superior segmentectomy of the right lower lobe. Scarring within the right upper lung zone is again noted. Small scattered bilateral pulmonary nodules are identified throughout both lungs which appear too numerous to count. Index nodule in the anterior left upper lobe measures 6 mm, image 63/7. Previously 4 mm. The index nodule within the left lower lobe measures 5 mm, image 80/7. On 06/16/2017 this nodule measured 1-2 mm. On 06/16/2017 this nodule measured 3 mm. Within the lateral left lower lobe there is a nodule measuring 6 mm, image 79/7. Previously this measured 4 mm. On 06/16/2017 this nodule measured 3 mm. Posterolateral right lower lobe lung nodule measures 5 mm, image 78/7. Previously 3 mm. New from 06/16/2017. Upper Abdomen: No acute abnormality. Musculoskeletal: No chest wall abnormality. No acute or significant osseous findings. IMPRESSION: 1. Multiple tiny nodules are  again noted scattered throughout both lungs. Nodules are too numerous to count. Compared with 05/29/2018 some of these nodules are stable and others demonstrate mild increase in size in the interval. When compared with 06/16/2017 a number of these nodules have clearly increased in size from previous exam and are therefore suspicious. Electronically Signed   By: TKerby MoorsM.D.   On: 11/26/2018 14:26        This patient was seen with Dr. MJulien Nordmann  ADDENDUM: Hematology/Oncology Attending: I had a face-to-face encounter with the patient.  I recommended her care plan.  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  The patient was recently started on treatment with Tagrisso 80 mg p.o. daily and has been tolerating this treatment well except for mild skin rash in the face. She was working recently in her garden and the patient noticed significant erythema and itching in the upper arms bilaterally.  The patient mentions that this is the area of contact with few of the plants in the garden.  She has a significant history of allergy to medications as well as plants. The rash is not consistent with what we normally see with Tagrisso. I recommended for the patient to start Medrol Dosepak as well as Benadryl.  We will not start her on any antibiotics at this point but we will continue to monitor her closely for any  changes. She has an appointment with me next week and I will reevaluate her condition and give further recommendation if needed. She will continue her current treatment with Tagrisso as planned. The patient was advised to call immediately if she has any concerning symptoms in the interval.  Disclaimer: This note was dictated with voice recognition software. Similar sounding words can inadvertently be transcribed and may be missed upon  review. Eilleen Kempf, MD 12/15/18

## 2018-12-14 ENCOUNTER — Encounter: Payer: Self-pay | Admitting: Internal Medicine

## 2018-12-14 ENCOUNTER — Encounter: Payer: Self-pay | Admitting: Medical

## 2018-12-15 ENCOUNTER — Encounter: Payer: Self-pay | Admitting: Medical

## 2018-12-17 ENCOUNTER — Inpatient Hospital Stay (HOSPITAL_BASED_OUTPATIENT_CLINIC_OR_DEPARTMENT_OTHER): Payer: Medicare Other | Admitting: Internal Medicine

## 2018-12-17 ENCOUNTER — Encounter: Payer: Self-pay | Admitting: Internal Medicine

## 2018-12-17 ENCOUNTER — Other Ambulatory Visit: Payer: Self-pay

## 2018-12-17 ENCOUNTER — Inpatient Hospital Stay: Payer: Medicare Other

## 2018-12-17 ENCOUNTER — Other Ambulatory Visit: Payer: Self-pay | Admitting: Medical Oncology

## 2018-12-17 VITALS — BP 132/73 | HR 96 | Temp 97.8°F | Resp 17 | Ht 59.0 in | Wt 90.7 lb

## 2018-12-17 DIAGNOSIS — R911 Solitary pulmonary nodule: Secondary | ICD-10-CM | POA: Diagnosis not present

## 2018-12-17 DIAGNOSIS — C3491 Malignant neoplasm of unspecified part of right bronchus or lung: Secondary | ICD-10-CM

## 2018-12-17 DIAGNOSIS — Z5111 Encounter for antineoplastic chemotherapy: Secondary | ICD-10-CM

## 2018-12-17 DIAGNOSIS — C3411 Malignant neoplasm of upper lobe, right bronchus or lung: Secondary | ICD-10-CM | POA: Diagnosis not present

## 2018-12-17 NOTE — Progress Notes (Signed)
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  PRIOR THERAPY: 1) Right VATS with right upper lobectomy with bronchoplasty closure and en bloc resection of a wedge from the superior segment of the right lower lobe in addition to mediastinal lymph node dissection on 07/30/2015 under the care of Dr. Roxan Hockey. 2) Adjuvant systemic chemotherapy with cisplatin 75 MG/M2 and Alimta 500 MG/M2 every 3 weeks. First dose was given on 09/10/2015. Status post 4 cycles. 3) adjuvant radiotherapy under the care of Dr. Tammi Klippel.  CURRENT THERAPY: Tagrisso 80 mg p.o. daily started 12/01/2018.  INTERVAL HISTORY: [name] West 68 y.o. female returns to the clinic today for follow-up visit.  The patient is feeling fine today with no concerning complaints.  She had the rash and erythema on the upper extremities last week after working in her garden.  The patient was started on Benadryl and prednisone.  She is feeling much better with almost complete resolution of the rash.  She denied having any current chest pain, shortness of breath, cough or hemoptysis.  She denied having any fever or chills.  She has no nausea, vomiting, diarrhea or constipation.  She continues to tolerate her treatment with Tagrisso fairly well.  She is here today for evaluation and management of her condition.   MEDICAL HISTORY: Past Medical History:  Diagnosis Date  . Anxiety   . Arthritis    shoulders  . Cancer ([place]) 06/2014   Lung Cancer  . Complication of anesthesia    nausea, does well with PROPOFOL   .  Cough 09/17/2015  . Encounter for antineoplastic chemotherapy 10/22/2015  . Environmental and seasonal allergies   . Family history of adverse reaction to anesthesia    Patients grandmother died while having hand surgery; pt unsure of cause  . Oral thrush 10/22/2015  . Ovarian cyst   . PONV (postoperative nausea and vomiting)   . Raynaud disease     ALLERGIES:  is allergic to avelox [moxifloxacin hcl in nacl]; cephalosporins; clindamycin/lincomycin; penicillins; macrobid [nitrofurantoin monohyd macro]; omnipaque [iohexol]; ciprofloxacin; adhesive [tape]; doxycycline; lidoderm [lidocaine]; pseudoephedrine; and sulfa antibiotics.  MEDICATIONS:  Current Outpatient Medications  Medication Sig Dispense Refill  . Azelaic Acid (FINACEA) 15 % cream Apply 1 application topically daily. After skin is thoroughly washed and patted dry, gently but thoroughly massage a thin film of azelaic acid cream into the affected area twice daily, in the morning and evening.     . cholecalciferol (VITAMIN D) 1000 UNITS tablet Take 1,000 Units by mouth daily.    . citalopram (CELEXA) 10 MG tablet Take 10 mg by mouth daily.    Marland Kitchen EPINEPHrine 0.3 mg/0.3 mL IJ SOAJ injection Inject 0.3 mg into the muscle once.     . fexofenadine (ALLEGRA ALLERGY) 180 MG tablet Allegra Allergy 180 mg tablet  Take 1 tablet every day by oral route.    . fexofenadine (ALLEGRA) 180 MG tablet Take 180 mg by mouth daily.    Marland Kitchen ibuprofen (ADVIL,MOTRIN) 200 MG tablet Take 200 mg by mouth every 6 (six) hours as needed (pain).    . LORazepam (ATIVAN) 0.5 MG tablet Take  0.5 mg by mouth at bedtime as needed for anxiety or sleep.     . methylPREDNISolone (MEDROL DOSEPAK) 4 MG TBPK tablet 6 x 1 day, 5 x 1 day, 4 x 1 day, 3 x 1 day, 2 x 1 day, 1 x 1 day 21 tablet 0  . Multiple Vitamin (MULTIVITAMIN WITH MINERALS) TABS Take 1 tablet by mouth daily.    . NONFORMULARY OR COMPOUNDED ITEM Place 1 application vaginally 2 (two) times a week. Estradiol 0.02% Cream     . osimertinib mesylate (TAGRISSO) 80 MG tablet Take 1 tablet (80 mg total) by mouth daily. 30 tablet 2  . predniSONE (DELTASONE) 50 MG tablet Take one tablet 13 hours , one tablet 7 hours and 1 tablet 1 hours prior to CT scan for CT dye allergy. 3 tablet 3  . Wheat Dextrin (BENEFIBER) POWD Take 1 Dose by mouth daily.     No current facility-administered medications for this visit.     SURGICAL HISTORY:  Past Surgical History:  Procedure Laterality Date  . BREAST EXCISIONAL BIOPSY Left    1996  . CESAREAN SECTION  1984  . COLONOSCOPY WITH PROPOFOL N/A 09/27/2012   Procedure: COLONOSCOPY WITH PROPOFOL;  Surgeon: Garlan Fair, MD;  Location: [facility];  Service: Endoscopy;  Laterality: N/A;  . Fibroid Removed    . LAPAROTOMY Left 07/20/2016   Procedure: MINI LAPAROTOMY, REMOVAL OF LEFT OVARIAN CYST, LEFT SALPINGO-OOPHORECTOMY;  Surgeon: Dian Queen, MD;  Location: [facility];  Service: Gynecology;  Laterality: Left;  please moved to 7:30am if spot opens  . Right Rotator Cuff Right   . TOTAL SHOULDER ARTHROPLASTY Left 06/14/2018   Procedure: TOTAL SHOULDER ARTHROPLASTY;  Surgeon: Justice Britain, MD;  Location: [facility];  Service: Orthopedics;  Laterality: Left;  115mn  . VIDEO ASSISTED THORACOSCOPY (VATS)/ LOBECTOMY Right 07/30/2015   Procedure: RIGHT VIDEO ASSISTED THORACOSCOPY (VATS)/RIGHT UPPER LOBECTOMY;  Surgeon: SMelrose Nakayama MD;  [facility]  Service: Thoracic;  Laterality: Right;  .Marland KitchenVIDEO BRONCHOSCOPY WITH ENDOBRONCHIAL NAVIGATION N/A 07/15/2015   Procedure: VIDEO BRONCHOSCOPY WITH ENDOBRONCHIAL NAVIGATION with Biopsy;  Surgeon: RCollene Gobble MD;  Location: [facility];  Service: Thoracic;  Laterality: N/A;    REVIEW OF SYSTEMS:  A comprehensive review of systems was negative.   PHYSICAL EXAMINATION: General appearance: alert, cooperative and no distress Head: Normocephalic, without obvious abnormality, atraumatic Neck: no adenopathy, no JVD, supple, symmetrical, trachea  midline and thyroid not enlarged, symmetric, no tenderness/mass/nodules Lymph nodes: Cervical, supraclavicular, and axillary nodes normal. Resp: clear to auscultation bilaterally Back: symmetric, no curvature. ROM normal. No CVA tenderness. Cardio: regular rate and rhythm, S1, S2 normal, no murmur, click, rub or gallop GI: soft, non-tender; bowel sounds normal; no masses,  no organomegaly Extremities: extremities normal, atraumatic, no cyanosis or edema  ECOG PERFORMANCE STATUS: 0 - Asymptomatic  Blood pressure 132/73, pulse 96, temperature 97.8 F (36.6 C), temperature source Oral, resp. rate 17, height 4' 11" (1.499 m), weight 90 lb 11.2 oz (41.1 kg), SpO2 97 %.  LABORATORY DATA: Lab Results  Component Value Date   WBC 6.5 12/13/2018   HGB 13.6 12/13/2018   HCT 41.5 12/13/2018   MCV 92.2 12/13/2018   PLT 168 12/13/2018      Chemistry      Component Value Date/Time   NA 139 12/13/2018 1339   NA 139 06/16/2017 1132   K 4.3 12/13/2018 1339   K 4.7 06/16/2017 1132   CL 100 12/13/2018 1339   CO2 30  12/13/2018 1339   CO2 28 06/16/2017 1132   BUN 16 12/13/2018 1339   BUN 19.5 06/16/2017 1132   CREATININE 0.94 12/13/2018 1339   CREATININE 0.8 06/16/2017 1132      Component Value Date/Time   CALCIUM 9.1 12/13/2018 1339   CALCIUM 10.0 06/16/2017 1132   ALKPHOS 62 12/13/2018 1339   ALKPHOS 68 06/16/2017 1132   AST 19 12/13/2018 1339   AST 21 06/16/2017 1132   ALT 18 12/13/2018 1339   ALT 18 06/16/2017 1132   BILITOT 0.4 12/13/2018 1339   BILITOT 0.50 06/16/2017 1132       RADIOGRAPHIC STUDIES: Ct Chest W Contrast  Result Date: 11/26/2018 CLINICAL DATA:  Non-small cell lung cancer. EXAM: CT CHEST WITH CONTRAST TECHNIQUE: Multidetector CT imaging of the chest was performed during intravenous contrast administration. CONTRAST:  1m OMNIPAQUE IOHEXOL 300 MG/ML  SOLN COMPARISON:  05/29/2018 FINDINGS: Cardiovascular: Normal heart size.  Aortic atherosclerosis.  Mediastinum/Nodes: No enlarged mediastinal, hilar, or axillary lymph nodes. Thyroid gland, trachea, and esophagus demonstrate no significant findings. Lungs/Pleura: Status post right upper lobectomy. Status post superior segmentectomy of the right lower lobe. Scarring within the right upper lung zone is again noted. Small scattered bilateral pulmonary nodules are identified throughout both lungs which appear too numerous to count. Index nodule in the anterior left upper lobe measures 6 mm, image 63/7. Previously 4 mm. The index nodule within the left lower lobe measures 5 mm, image 80/7. On 06/16/2017 this nodule measured 1-2 mm. On 06/16/2017 this nodule measured 3 mm. Within the lateral left lower lobe there is a nodule measuring 6 mm, image 79/7. Previously this measured 4 mm. On 06/16/2017 this nodule measured 3 mm. Posterolateral right lower lobe lung nodule measures 5 mm, image 78/7. Previously 3 mm. New from 06/16/2017. Upper Abdomen: No acute abnormality. Musculoskeletal: No chest wall abnormality. No acute or significant osseous findings. IMPRESSION: 1. Multiple tiny nodules are again noted scattered throughout both lungs. Nodules are too numerous to count. Compared with 05/29/2018 some of these nodules are stable and others demonstrate mild increase in size in the interval. When compared with 06/16/2017 a number of these nodules have clearly increased in size from previous exam and are therefore suspicious. Electronically Signed   By: TKerby MoorsM.D.   On: 11/26/2018 14:26    ASSESSMENT AND PLAN:  This is a very pleasant 68years old white female with stage IIIA non-small cell lung cancer, adenocarcinoma with positive EGFR mutation in exon 19 status post right upper lobectomy with lymph node dissection followed by adjuvant systemic chemotherapy as well as adjuvant radiation. The patient has been on observation for almost 3 years now.  She is feeling fine with no concerning complaints.    Unfortunately her scan in June 2020 showed multiple tiny pulmonary nodules scattered throughout both lungs which many of them have increased in size and some new nodule suspected the previous scan. The patient started treatment with Tagrisso 80 mg p.o. daily and she has been tolerating it fairly well. I recommended for her to continue her current treatment with the same dose. I will see her back for follow-up visit in 2 weeks for evaluation and repeat blood work. Regarding the rash in the upper extremities this is completely resolved with the steroid and Benadryl. She was advised to call immediately if she has any concerning symptoms in the interval. The patient voices understanding of current disease status and treatment options and is in agreement with the current care plan.  All questions were answered. The patient knows to call the clinic with any problems, questions or concerns. We can certainly see the patient much sooner if necessary. I spent 10 minutes counseling the patient face to face. The total time spent in the appointment was 15 minutes. Disclaimer: This note was dictated with voice recognition software. Similar sounding words can inadvertently be transcribed and may not be corrected upon review.

## 2018-12-18 ENCOUNTER — Telehealth: Payer: Self-pay | Admitting: Internal Medicine

## 2018-12-18 NOTE — Telephone Encounter (Signed)
Scheduled appt per 7/06 los - pt aware of appt date and time

## 2018-12-19 ENCOUNTER — Other Ambulatory Visit: Payer: Medicare Other

## 2018-12-24 MED FILL — TAGRISSO 80 MG TABLET: 80 | 30 days supply | Qty: 30 | Fill #1

## 2018-12-25 ENCOUNTER — Telehealth: Payer: Self-pay | Admitting: Internal Medicine

## 2018-12-25 DIAGNOSIS — E782 Mixed hyperlipidemia: Secondary | ICD-10-CM

## 2018-12-25 NOTE — Telephone Encounter (Signed)
Patient was due for labs approx May 2020. Advised since last lipid profile (NMR) was done Oct 2019, she should repeat this test prior to her Oct 2020 visit with MD. This has been ordered. She was advised on our lab hours.

## 2018-12-25 NOTE — Telephone Encounter (Signed)
Please advise if labs are needed. Thank you!

## 2018-12-25 NOTE — Telephone Encounter (Signed)
New Message   Patient states that she has now rescheduled her appointment twice due to the fear of Covid-19. She wanted to know since she has now scheduled her appointment again and pushed it out until 03/14/19 would be need to have blood work done with it being closer to a 1 year follow up instead of the requested 6 month follow up? Please give patient a call.

## 2018-12-31 ENCOUNTER — Telehealth: Payer: Self-pay | Admitting: Physician Assistant

## 2018-12-31 ENCOUNTER — Encounter: Payer: Self-pay | Admitting: Physician Assistant

## 2018-12-31 ENCOUNTER — Inpatient Hospital Stay: Payer: Medicare Other

## 2018-12-31 ENCOUNTER — Inpatient Hospital Stay (HOSPITAL_BASED_OUTPATIENT_CLINIC_OR_DEPARTMENT_OTHER): Payer: Medicare Other | Admitting: Physician Assistant

## 2018-12-31 ENCOUNTER — Encounter: Payer: Self-pay | Admitting: Internal Medicine

## 2018-12-31 ENCOUNTER — Other Ambulatory Visit: Payer: Self-pay

## 2018-12-31 VITALS — BP 136/74 | HR 96 | Temp 98.5°F | Resp 18 | Ht 59.0 in | Wt 91.4 lb

## 2018-12-31 DIAGNOSIS — R911 Solitary pulmonary nodule: Secondary | ICD-10-CM | POA: Diagnosis not present

## 2018-12-31 DIAGNOSIS — C3491 Malignant neoplasm of unspecified part of right bronchus or lung: Secondary | ICD-10-CM

## 2018-12-31 DIAGNOSIS — C3411 Malignant neoplasm of upper lobe, right bronchus or lung: Secondary | ICD-10-CM

## 2018-12-31 DIAGNOSIS — Z5111 Encounter for antineoplastic chemotherapy: Secondary | ICD-10-CM

## 2018-12-31 LAB — CBC WITH DIFFERENTIAL (CANCER CENTER ONLY)
Abs Immature Granulocytes: 0.02 10*3/uL (ref 0.00–0.07)
Basophils Absolute: 0.1 10*3/uL (ref 0.0–0.1)
Basophils Relative: 1 %
Eosinophils Absolute: 0.2 10*3/uL (ref 0.0–0.5)
Eosinophils Relative: 4 %
HCT: 41.1 % (ref 36.0–46.0)
Hemoglobin: 13.6 g/dL (ref 12.0–15.0)
Immature Granulocytes: 0 %
Lymphocytes Relative: 15 %
Lymphs Abs: 0.8 10*3/uL (ref 0.7–4.0)
MCH: 31 pg (ref 26.0–34.0)
MCHC: 33.1 g/dL (ref 30.0–36.0)
MCV: 93.6 fL (ref 80.0–100.0)
Monocytes Absolute: 0.7 10*3/uL (ref 0.1–1.0)
Monocytes Relative: 14 %
Neutro Abs: 3.2 10*3/uL (ref 1.7–7.7)
Neutrophils Relative %: 66 %
Platelet Count: 162 10*3/uL (ref 150–400)
RBC: 4.39 MIL/uL (ref 3.87–5.11)
RDW: 13.2 % (ref 11.5–15.5)
WBC Count: 4.9 10*3/uL (ref 4.0–10.5)
nRBC: 0 % (ref 0.0–0.2)

## 2018-12-31 LAB — CMP (CANCER CENTER ONLY)
ALT: 17 U/L (ref 0–44)
AST: 17 U/L (ref 15–41)
Albumin: 3.7 g/dL (ref 3.5–5.0)
Alkaline Phosphatase: 56 U/L (ref 38–126)
Anion gap: 9 (ref 5–15)
BUN: 22 mg/dL (ref 8–23)
CO2: 28 mmol/L (ref 22–32)
Calcium: 9 mg/dL (ref 8.9–10.3)
Chloride: 103 mmol/L (ref 98–111)
Creatinine: 0.91 mg/dL (ref 0.44–1.00)
GFR, Est AFR Am: >60 mL/min (ref >60)
GFR, Estimated: >60 mL/min (ref >60)
Glucose, Bld: 86 mg/dL (ref 70–99)
Potassium: 4.5 mmol/L (ref 3.5–5.1)
Sodium: 140 mmol/L (ref 135–145)
Total Bilirubin: 0.4 mg/dL (ref 0.3–1.2)
Total Protein: 6.6 g/dL (ref 6.5–8.1)

## 2018-12-31 LAB — MAGNESIUM: Magnesium: 2 mg/dL (ref 1.7–2.4)

## 2018-12-31 NOTE — Progress Notes (Signed)
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  PRIOR THERAPY: 1) Right VATS with right upper lobectomy with bronchoplasty closure and en bloc resection of a wedge from the superior segment of the right lower lobe in addition to mediastinal lymph node dissection on 07/30/2015 under the care of Dr. Roxan Hockey. 2) Adjuvant systemic chemotherapy with cisplatin 75 MG/M2 and Alimta 500 MG/M2 every 3 weeks. First dose was given on 09/10/2015. Status post 4cycles. 3) adjuvant radiotherapy under the care of Dr. Tammi Klippel.  CURRENT THERAPY: Tagrisso 80 mg p.o. daily started 12/01/2018.  INTERVAL HISTORY: [name] West 68 y.o. female returns to the clinic for a follow-up visit.  The patient had been on for almost 3 years until her most recent scan showed evidence of disease progression with multiple tiny pulmonary nodules scattered throughout both lungs.  She is positive for an EGFR mutation.  She was then subsequently started on Tagrisso 80 mg p.o. daily.  She has been on Tagrisso for 1 month.  She has been tolerating Tagrisso well without any adverse effects.  She did incidentally develop a rash on the upper part of her arms bilaterally.  The rash started 1 week after starting Tagrisso; however, the patient states that she has a rashsimilar to this in the past which occurred after she was gardening in the yard.  This rash also occurred after gardening in the yard.  She was treated with Benadryl and prednisone which resolved the rash.  She has not had any rashes or skin changes since this time.  She denies any fever, chills, night sweats,  or weight loss.  She denies any chest pain, shortness of breath, cough, or hemoptysis.  She denies any nausea, vomiting, diarrhea, or constipation.  She denies any headaches or visual changes. The patient is here today for evaluation and routine blood work.  MEDICAL HISTORY: Past Medical History:  Diagnosis Date  . Anxiety   . Arthritis    shoulders  . Cancer ([place]) 06/2014   Lung Cancer  . Complication of anesthesia    nausea, does well with PROPOFOL   . Cough 09/17/2015  . Encounter for antineoplastic chemotherapy 10/22/2015  . Environmental and seasonal allergies   . Family history of adverse reaction to anesthesia    Patients grandmother died while having hand surgery; pt unsure of cause  . Oral thrush 10/22/2015  . Ovarian cyst   . PONV (postoperative nausea and vomiting)   . Raynaud disease     ALLERGIES:  is allergic to avelox [moxifloxacin hcl in nacl]; cephalosporins; clindamycin/lincomycin; penicillins; macrobid [nitrofurantoin monohyd macro]; omnipaque [iohexol]; ciprofloxacin; adhesive [tape]; doxycycline; lidoderm [lidocaine]; pseudoephedrine; and sulfa antibiotics.  MEDICATIONS:  Current Outpatient Medications  Medication Sig Dispense Refill  . Azelaic Acid (FINACEA) 15 % cream Apply 1 application topically daily. After skin is thoroughly washed and patted dry, gently but thoroughly massage a thin film of azelaic acid cream into the affected area twice daily, in the morning and evening.     . cholecalciferol (VITAMIN D) 1000 UNITS tablet Take 1,000 Units by mouth daily.    . citalopram (CELEXA) 10 MG tablet Take 10 mg by mouth daily.    Marland Kitchen EPINEPHrine  0.3 mg/0.3 mL IJ SOAJ injection Inject 0.3 mg into the muscle once.     . fexofenadine (ALLEGRA ALLERGY) 180 MG tablet Allegra Allergy 180 mg tablet  Take 1 tablet every day by oral route.    . fexofenadine (ALLEGRA) 180 MG tablet Take 180 mg by mouth daily.    Marland Kitchen ibuprofen (ADVIL,MOTRIN) 200 MG tablet Take 200 mg by mouth  every 6 (six) hours as needed (pain).    . LORazepam (ATIVAN) 0.5 MG tablet Take 0.5 mg by mouth at bedtime as needed for anxiety or sleep.     . methylPREDNISolone (MEDROL DOSEPAK) 4 MG TBPK tablet 6 x 1 day, 5 x 1 day, 4 x 1 day, 3 x 1 day, 2 x 1 day, 1 x 1 day 21 tablet 0  . Multiple Vitamin (MULTIVITAMIN WITH MINERALS) TABS Take 1 tablet by mouth daily.    . NONFORMULARY OR COMPOUNDED ITEM Place 1 application vaginally 2 (two) times a week. Estradiol 0.02% Cream    . osimertinib mesylate (TAGRISSO) 80 MG tablet Take 1 tablet (80 mg total) by mouth daily. 30 tablet 2  . predniSONE (DELTASONE) 50 MG tablet Take one tablet 13 hours , one tablet 7 hours and 1 tablet 1 hours prior to CT scan for CT dye allergy. 3 tablet 3  . Wheat Dextrin (BENEFIBER) POWD Take 1 Dose by mouth daily.     No current facility-administered medications for this visit.     SURGICAL HISTORY:  Past Surgical History:  Procedure Laterality Date  . BREAST EXCISIONAL BIOPSY Left    1996  . CESAREAN SECTION  1984  . COLONOSCOPY WITH PROPOFOL N/A 09/27/2012   Procedure: COLONOSCOPY WITH PROPOFOL;  Surgeon: Garlan Fair, MD;  Location: [facility];  Service: Endoscopy;  Laterality: N/A;  . Fibroid Removed    . LAPAROTOMY Left 07/20/2016   Procedure: MINI LAPAROTOMY, REMOVAL OF LEFT OVARIAN CYST, LEFT SALPINGO-OOPHORECTOMY;  Surgeon: Dian Queen, MD;  Location: [facility];  Service: Gynecology;  Laterality: Left;  please moved to 7:30am if spot opens  . Right Rotator Cuff Right   . TOTAL SHOULDER ARTHROPLASTY Left 06/14/2018   Procedure: TOTAL SHOULDER ARTHROPLASTY;  Surgeon: Justice Britain, MD;  Location: [facility];  Service: Orthopedics;  Laterality: Left;  164mn  . VIDEO ASSISTED THORACOSCOPY (VATS)/ LOBECTOMY Right 07/30/2015   Procedure: RIGHT VIDEO ASSISTED THORACOSCOPY (VATS)/RIGHT UPPER LOBECTOMY;  Surgeon: SMelrose Nakayama MD;  [facility]  Service: Thoracic;  Laterality: Right;  .Marland KitchenVIDEO BRONCHOSCOPY WITH  ENDOBRONCHIAL NAVIGATION N/A 07/15/2015   Procedure: VIDEO BRONCHOSCOPY WITH ENDOBRONCHIAL NAVIGATION with Biopsy;  Surgeon: RCollene Gobble MD;  Location: [facility];  Service: Thoracic;  Laterality: N/A;    REVIEW OF SYSTEMS:   Review of Systems  Constitutional: Negative for appetite change, chills, fatigue, fever and unexpected weight change.  HENT: Negative for mouth sores, nosebleeds, sore throat and trouble swallowing.   Eyes: Negative for eye problems and icterus.  Respiratory: Negative for cough, hemoptysis, shortness of breath and wheezing.   Cardiovascular: Negative for chest pain and leg swelling.  Gastrointestinal: Negative for abdominal pain, constipation, diarrhea, nausea and vomiting.  Genitourinary: Negative for bladder incontinence, difficulty urinating, dysuria, frequency and hematuria.   Musculoskeletal: Negative for back pain, gait problem, neck pain and neck stiffness.  Skin: Negative for itching and rash.  Neurological: Negative for dizziness, extremity weakness, gait problem, headaches, light-headedness and seizures.  Hematological: Negative for adenopathy. Does not bruise/bleed easily.  Psychiatric/Behavioral: Negative for confusion, depression and  sleep disturbance. The patient is not nervous/anxious.     PHYSICAL EXAMINATION:  Blood pressure 136/74, pulse 96, temperature 98.5 F (36.9 C), temperature source Oral, resp. rate 18, height _0  (1.499 m), weight 91 lb 6.4 oz (41.5 kg), SpO2 100 %.  ECOG PERFORMANCE STATUS: 1 - Symptomatic but completely ambulatory  Physical Exam  Constitutional: Oriented to person, place, and time and thin-appearing female and in no distress.   HENT:  Head: Normocephalic and atraumatic.  Mouth/Throat: Oropharynx is clear and moist. No oropharyngeal exudate.  Eyes: Conjunctivae are normal. Right eye exhibits no discharge. Left eye exhibits no discharge. No scleral icterus.  Neck: Normal range of motion. Neck supple.  Cardiovascular:  Normal rate, regular rhythm, normal heart sounds and intact distal pulses.   Pulmonary/Chest: Effort normal and breath sounds normal. No respiratory distress. No wheezes. No rales.  Abdominal: Soft. Bowel sounds are normal. Exhibits no distension and no mass. There is no tenderness.  Musculoskeletal: Normal range of motion. Exhibits no edema.  Lymphadenopathy:    No cervical adenopathy.  Neurological: Alert and oriented to person, place, and time. Exhibits normal muscle tone. Gait normal. Coordination normal.  Skin: Skin is warm and dry. No rash noted. Not diaphoretic. No erythema. No pallor.  Psychiatric: Mood, memory and judgment normal.  Vitals reviewed.  LABORATORY DATA: Lab Results  Component Value Date   WBC 4.9 12/31/2018   HGB 13.6 12/31/2018   HCT 41.1 12/31/2018   MCV 93.6 12/31/2018   PLT 162 12/31/2018      Chemistry      Component Value Date/Time   NA 140 12/31/2018 0753   NA 139 06/16/2017 1132   K 4.5 12/31/2018 0753   K 4.7 06/16/2017 1132   CL 103 12/31/2018 0753   CO2 28 12/31/2018 0753   CO2 28 06/16/2017 1132   BUN 22 12/31/2018 0753   BUN 19.5 06/16/2017 1132   CREATININE 0.91 12/31/2018 0753   CREATININE 0.8 06/16/2017 1132      Component Value Date/Time   CALCIUM 9.0 12/31/2018 0753   CALCIUM 10.0 06/16/2017 1132   ALKPHOS 56 12/31/2018 0753   ALKPHOS 68 06/16/2017 1132   AST 17 12/31/2018 0753   AST 21 06/16/2017 1132   ALT 17 12/31/2018 0753   ALT 18 06/16/2017 1132   BILITOT 0.4 12/31/2018 0753   BILITOT 0.50 06/16/2017 1132       RADIOGRAPHIC STUDIES:  No results found.   ASSESSMENT/PLAN:  This is a very pleasant 68 year old Caucasian female initially diagnosed with stage IIIa non-small cell lung cancer, adenocarcinoma.  She is status post a right upper lobectomy with lymph node dissection followed by adjuvant systemic chemotherapy and adjuvant radiation.  She was initially diagnosed in February 2017.  She had been on observation  for almost 3 years until her most recent scan in June 2020 showed multiple pulmonary nodules scattered throughout both lungs.  She is positive for EGFR mutation.  She was then started on Tagrisso 80 mg p.o. daily.  She is status post 1 month of treatment.  She has been tolerating treatment well without any adverse effects.  The patient was seen with Dr. Julien Nordmann today.  Labs were reviewed with the patient.  We recommend that she proceed on the same treatment at the same dose.  I will arrange for the patient to obtain a restaging CT scan of the chest in 1 month.  The patient has a refill at her pharmacy for her premedications before her scan due  to her contrast allergy.   We will see the patient back for a follow-up visit in 1 month for evaluation and to review her scan results.  The patient was advised to call immediately if she has any concerning symptoms in the interval. The patient voices understanding of current disease status and treatment options and is in agreement with the current care plan. All questions were answered. The patient knows to call the clinic with any problems, questions or concerns. We can certainly see the patient much sooner if necessary.   Orders Placed This Encounter  Procedures  . CT Chest W Contrast    Standing Status:   Future    Standing Expiration Date:   12/31/2019    Order Specific Question:   ** REASON FOR EXAM (FREE TEXT)    Answer:   Restaging Lung Cancer    Order Specific Question:   If indicated for the ordered procedure, I authorize the administration of contrast media per Radiology protocol    Answer:   Yes    Order Specific Question:   Preferred imaging location?    Answer:   [facility]    Order Specific Question:   Radiology Contrast Protocol - do NOT remove file path    Answer:   [email or web address]      L , PA-C 12/31/18   ADDENDUM: Hematology/Oncology Attending: I had a face-to-face  encounter with the patient today.  I recommended her care plan.  This is a very pleasant 68 years old white female with metastatic non-small cell lung cancer, adenocarcinoma with positive EGFR mutation and bilateral pulmonary nodules.  She is currently undergoing treatment with targeted therapy with Tagrisso 80 mg p.o. daily status post 1 months.  The patient is feeling much better today with no concerning complaints.  She denied having any skin rash or diarrhea.  She has no nausea, vomiting, diarrhea or constipation.  She denied having any headache or visual changes.  Her lab work is unremarkable. I recommended for the patient to continue her current treatment with Tagrisso. I will see her back for follow-up visit in 1 months for evaluation with repeat CT scan of the chest for restaging of her disease. The patient was advised to call immediately if she has any concerning symptoms in the interval.  Disclaimer: This note was dictated with voice recognition software. Similar sounding words can inadvertently be transcribed and may be missed upon review. Eilleen Kempf, MD 12/31/18

## 2018-12-31 NOTE — Telephone Encounter (Signed)
Scheduled appt per 7/20 los.

## 2019-01-09 ENCOUNTER — Ambulatory Visit: Payer: Medicare Other

## 2019-01-09 DIAGNOSIS — L814 Other melanin hyperpigmentation: Secondary | ICD-10-CM | POA: Diagnosis not present

## 2019-01-09 DIAGNOSIS — Z86018 Personal history of other benign neoplasm: Secondary | ICD-10-CM | POA: Diagnosis not present

## 2019-01-09 DIAGNOSIS — D225 Melanocytic nevi of trunk: Secondary | ICD-10-CM | POA: Diagnosis not present

## 2019-01-09 DIAGNOSIS — L821 Other seborrheic keratosis: Secondary | ICD-10-CM | POA: Diagnosis not present

## 2019-01-23 MED FILL — TAGRISSO 80 MG TABLET: 80 | 30 days supply | Qty: 30 | Fill #2

## 2019-01-25 ENCOUNTER — Other Ambulatory Visit: Payer: Self-pay | Admitting: *Deleted

## 2019-01-25 ENCOUNTER — Telehealth: Payer: Self-pay | Admitting: *Deleted

## 2019-01-25 DIAGNOSIS — Z91041 Radiographic dye allergy status: Secondary | ICD-10-CM

## 2019-01-25 MED ORDER — PREDNISONE 50 MG PO TABS: ORAL_TABLET | ORAL | 3 refills | Status: DC

## 2019-01-25 NOTE — Telephone Encounter (Signed)
TCT patient regarding 13 hour contrast allergy prep she needs to do in preparation for her CT scans on Monday, 01/28/19.  Spoke with her. She is aware of this and has the prednisone and the benadryl she needs for this and verbalizes understanding on how to take it.

## 2019-01-28 ENCOUNTER — Inpatient Hospital Stay: Payer: Medicare Other | Attending: Internal Medicine

## 2019-01-28 ENCOUNTER — Ambulatory Visit (HOSPITAL_COMMUNITY)
Admission: RE | Admit: 2019-01-28 | Discharge: 2019-01-28 | Disposition: A | Discharge status: Home / Self Care | Payer: Medicare Other | Source: Ambulatory Visit | Attending: Physician Assistant | Admitting: Physician Assistant

## 2019-01-28 ENCOUNTER — Ambulatory Visit: Payer: Medicare Other | Admitting: Internal Medicine

## 2019-01-28 ENCOUNTER — Other Ambulatory Visit: Payer: Self-pay

## 2019-01-28 DIAGNOSIS — C3491 Malignant neoplasm of unspecified part of right bronchus or lung: Secondary | ICD-10-CM | POA: Insufficient documentation

## 2019-01-28 DIAGNOSIS — C3411 Malignant neoplasm of upper lobe, right bronchus or lung: Secondary | ICD-10-CM | POA: Diagnosis not present

## 2019-01-28 DIAGNOSIS — R21 Rash and other nonspecific skin eruption: Secondary | ICD-10-CM | POA: Diagnosis not present

## 2019-01-28 DIAGNOSIS — R918 Other nonspecific abnormal finding of lung field: Secondary | ICD-10-CM | POA: Diagnosis not present

## 2019-01-28 DIAGNOSIS — I2510 Atherosclerotic heart disease of native coronary artery without angina pectoris: Secondary | ICD-10-CM | POA: Diagnosis not present

## 2019-01-28 DIAGNOSIS — Z471 Aftercare following joint replacement surgery: Secondary | ICD-10-CM | POA: Diagnosis not present

## 2019-01-28 DIAGNOSIS — Z96612 Presence of left artificial shoulder joint: Secondary | ICD-10-CM | POA: Diagnosis not present

## 2019-01-28 LAB — CMP (CANCER CENTER ONLY)
ALT: 18 U/L (ref 0–44)
AST: 23 U/L (ref 15–41)
Albumin: 4.3 g/dL (ref 3.5–5.0)
Alkaline Phosphatase: 54 U/L (ref 38–126)
Anion gap: 9 (ref 5–15)
BUN: 23 mg/dL (ref 8–23)
CO2: 25 mmol/L (ref 22–32)
Calcium: 9.5 mg/dL (ref 8.9–10.3)
Chloride: 102 mmol/L (ref 98–111)
Creatinine: 0.97 mg/dL (ref 0.44–1.00)
GFR, Est AFR Am: >60 mL/min (ref >60)
GFR, Estimated: 60 mL/min — ABNORMAL LOW (ref >60)
Glucose, Bld: 166 mg/dL — ABNORMAL HIGH (ref 70–99)
Potassium: 4.4 mmol/L (ref 3.5–5.1)
Sodium: 136 mmol/L (ref 135–145)
Total Bilirubin: 0.5 mg/dL (ref 0.3–1.2)
Total Protein: 7.4 g/dL (ref 6.5–8.1)

## 2019-01-28 LAB — CBC WITH DIFFERENTIAL (CANCER CENTER ONLY)
Abs Immature Granulocytes: 0.02 10*3/uL (ref 0.00–0.07)
Basophils Absolute: 0 10*3/uL (ref 0.0–0.1)
Basophils Relative: 0 %
Eosinophils Absolute: 0 10*3/uL (ref 0.0–0.5)
Eosinophils Relative: 0 %
HCT: 45.8 % (ref 36.0–46.0)
Hemoglobin: 15.2 g/dL — ABNORMAL HIGH (ref 12.0–15.0)
Immature Granulocytes: 0 %
Lymphocytes Relative: 6 %
Lymphs Abs: 0.5 10*3/uL — ABNORMAL LOW (ref 0.7–4.0)
MCH: 30.8 pg (ref 26.0–34.0)
MCHC: 33.2 g/dL (ref 30.0–36.0)
MCV: 92.7 fL (ref 80.0–100.0)
Monocytes Absolute: 0.1 10*3/uL (ref 0.1–1.0)
Monocytes Relative: 1 %
Neutro Abs: 8.3 10*3/uL — ABNORMAL HIGH (ref 1.7–7.7)
Neutrophils Relative %: 93 %
Platelet Count: 182 10*3/uL (ref 150–400)
RBC: 4.94 MIL/uL (ref 3.87–5.11)
RDW: 12.7 % (ref 11.5–15.5)
WBC Count: 9 10*3/uL (ref 4.0–10.5)
nRBC: 0 % (ref 0.0–0.2)

## 2019-01-28 LAB — MAGNESIUM: Magnesium: 2 mg/dL (ref 1.7–2.4)

## 2019-01-28 MED ORDER — IOHEXOL 300 MG/ML  SOLN
75.0000 mL | Freq: Once | INTRAMUSCULAR | Status: AC | PRN
  Administered 2019-01-28: 75 mL via INTRAVENOUS

## 2019-01-28 MED ORDER — SODIUM CHLORIDE (PF) 0.9 % IJ SOLN
INTRAMUSCULAR | Status: AC
  Filled 2019-01-28: qty 50

## 2019-01-29 ENCOUNTER — Encounter: Payer: Self-pay | Admitting: Internal Medicine

## 2019-01-29 ENCOUNTER — Inpatient Hospital Stay (HOSPITAL_BASED_OUTPATIENT_CLINIC_OR_DEPARTMENT_OTHER): Payer: Medicare Other | Admitting: Internal Medicine

## 2019-01-29 ENCOUNTER — Telehealth: Payer: Self-pay | Admitting: Internal Medicine

## 2019-01-29 ENCOUNTER — Other Ambulatory Visit: Payer: Self-pay

## 2019-01-29 VITALS — BP 114/75 | HR 89 | Temp 98.5°F | Resp 18 | Ht 59.0 in | Wt 93.3 lb

## 2019-01-29 DIAGNOSIS — C3411 Malignant neoplasm of upper lobe, right bronchus or lung: Secondary | ICD-10-CM | POA: Diagnosis not present

## 2019-01-29 DIAGNOSIS — C3491 Malignant neoplasm of unspecified part of right bronchus or lung: Secondary | ICD-10-CM

## 2019-01-29 DIAGNOSIS — R21 Rash and other nonspecific skin eruption: Secondary | ICD-10-CM | POA: Diagnosis not present

## 2019-01-29 DIAGNOSIS — Z5111 Encounter for antineoplastic chemotherapy: Secondary | ICD-10-CM

## 2019-01-29 NOTE — Progress Notes (Signed)
    [facility] [phone]   [phone]  OFFICE PROGRESS NOTE  McNeill, Wendy, MD [address] [place] [address]  DIAGNOSIS: Recurrent non-small cell lung cancer initially diagnosed as stage IIIA (T2a, N2, M0) non-small cell lung cancer, adenocarcinoma with positive EGFR mutation with deletion in exon 19, presented with right upper lobe lung mass and mediastinal lymphadenopathy, status post surgical resection February 2017.  PRIOR THERAPY: 1) Right VATS with right upper lobectomy with bronchoplasty closure and en bloc resection of a wedge from the superior segment of the right lower lobe in addition to mediastinal lymph node dissection on 07/30/2015 under the care of Dr. Hendrickson. 2) Adjuvant systemic chemotherapy with cisplatin 75 MG/M2 and Alimta 500 MG/M2 every 3 weeks. First dose was given on 09/10/2015. Status post 4 cycles. 3) adjuvant radiotherapy under the care of Dr. Manning.  CURRENT THERAPY: Tagrisso 80 mg p.o. daily started 12/01/2018.  Status post 2 months of treatment.  INTERVAL HISTORY: [name] West 68 y.o. female returns to the clinic today for follow-up visit.  The patient is feeling fine today with no concerning complaints.  She has some skin rash secondary to the prednisone premedication for her CT scan.  She denied having any diarrhea.  She has no nausea, vomiting or constipation.  She denied having any chest pain, shortness of breath, cough or hemoptysis.  She has no fever or chills.  The patient denied having any significant weight loss or night sweats.  She has been tolerating her treatment with Tagrisso fairly well.  She had repeat CT scan of the chest performed recently and she is here for evaluation and discussion of her scan results.   MEDICAL HISTORY: Past Medical History:  Diagnosis Date  . Anxiety   . Arthritis    shoulders  . Cancer (HCC) 06/2014   Lung Cancer  . Complication of anesthesia    nausea, does  well with PROPOFOL   . Cough 09/17/2015  . Encounter for antineoplastic chemotherapy 10/22/2015  . Environmental and seasonal allergies   . Family history of adverse reaction to anesthesia    Patients grandmother died while having hand surgery; pt unsure of cause  . Oral thrush 10/22/2015  . Ovarian cyst   . PONV (postoperative nausea and vomiting)   . Raynaud disease     ALLERGIES:  is allergic to avelox [moxifloxacin hcl in nacl]; cephalosporins; clindamycin/lincomycin; penicillins; macrobid [nitrofurantoin monohyd macro]; omnipaque [iohexol]; ciprofloxacin; adhesive [tape]; doxycycline; lidoderm [lidocaine]; pseudoephedrine; and sulfa antibiotics.  MEDICATIONS:  Current Outpatient Medications  Medication Sig Dispense Refill  . Azelaic Acid (FINACEA) 15 % cream Apply 1 application topically daily. After skin is thoroughly washed and patted dry, gently but thoroughly massage a thin film of azelaic acid cream into the affected area twice daily, in the morning and evening.     . cholecalciferol (VITAMIN D) 1000 UNITS tablet Take 1,000 Units by mouth daily.    . citalopram (CELEXA) 10 MG tablet Take 10 mg by mouth daily.    . fexofenadine (ALLEGRA) 180 MG tablet Take 180 mg by mouth daily.    . ibuprofen (ADVIL,MOTRIN) 200 MG tablet Take 200 mg by mouth every 6 (six) hours as needed (pain).    . LORazepam (ATIVAN) 0.5 MG tablet Take 0.5 mg by mouth at bedtime as needed for anxiety or sleep.     . Multiple Vitamin (MULTIVITAMIN WITH MINERALS) TABS Take 1 tablet by mouth daily.    . NONFORMULARY OR   COMPOUNDED ITEM Place 1 application vaginally 2 (two) times a week. Estradiol 0.02% Cream    . osimertinib mesylate (TAGRISSO) 80 MG tablet Take 1 tablet (80 mg total) by mouth daily. 30 tablet 2  . Wheat Dextrin (BENEFIBER) POWD Take 1 Dose by mouth daily.    . predniSONE (DELTASONE) 50 MG tablet Take one tablet 13 hours , one tablet 7 hours and 1 tablet 1 hours prior to CT scan for CT dye allergy.  (Patient not taking: Reported on 01/29/2019) 3 tablet 3   No current facility-administered medications for this visit.     SURGICAL HISTORY:  Past Surgical History:  Procedure Laterality Date  . BREAST EXCISIONAL BIOPSY Left    1996  . CESAREAN SECTION  1984  . COLONOSCOPY WITH PROPOFOL N/A 09/27/2012   Procedure: COLONOSCOPY WITH PROPOFOL;  Surgeon: Martin K Johnson, MD;  Location: [facility];  Service: Endoscopy;  Laterality: N/A;  . Fibroid Removed    . LAPAROTOMY Left 07/20/2016   Procedure: MINI LAPAROTOMY, REMOVAL OF LEFT OVARIAN CYST, LEFT SALPINGO-OOPHORECTOMY;  Surgeon: Michelle Grewal, MD;  Location: [facility];  Service: Gynecology;  Laterality: Left;  please moved to 7:30am if spot opens  . Right Rotator Cuff Right   . TOTAL SHOULDER ARTHROPLASTY Left 06/14/2018   Procedure: TOTAL SHOULDER ARTHROPLASTY;  Surgeon: Supple, Kevin, MD;  Location: [facility];  Service: Orthopedics;  Laterality: Left;  120min  . VIDEO ASSISTED THORACOSCOPY (VATS)/ LOBECTOMY Right 07/30/2015   Procedure: RIGHT VIDEO ASSISTED THORACOSCOPY (VATS)/RIGHT UPPER LOBECTOMY;  Surgeon: Steven C Hendrickson, MD;  Location: [facility];  Service: Thoracic;  Laterality: Right;  . VIDEO BRONCHOSCOPY WITH ENDOBRONCHIAL NAVIGATION N/A 07/15/2015   Procedure: VIDEO BRONCHOSCOPY WITH ENDOBRONCHIAL NAVIGATION with Biopsy;  Surgeon: Robert S Byrum, MD;  Location: [facility];  Service: Thoracic;  Laterality: N/A;    REVIEW OF SYSTEMS:  Constitutional: negative Eyes: negative Ears, nose, mouth, throat, and face: negative Respiratory: negative Cardiovascular: negative Gastrointestinal: negative Genitourinary:negative Integument/breast: positive for rash Hematologic/lymphatic: negative Musculoskeletal:negative Neurological: negative Behavioral/Psych: negative Endocrine: negative Allergic/Immunologic: negative   PHYSICAL EXAMINATION: General appearance: alert, cooperative and no distress Head: Normocephalic, without obvious abnormality,  atraumatic Neck: no adenopathy, no JVD, supple, symmetrical, trachea midline and thyroid not enlarged, symmetric, no tenderness/mass/nodules Lymph nodes: Cervical, supraclavicular, and axillary nodes normal. Resp: clear to auscultation bilaterally Back: symmetric, no curvature. ROM normal. No CVA tenderness. Cardio: regular rate and rhythm, S1, S2 normal, no murmur, click, rub or gallop GI: soft, non-tender; bowel sounds normal; no masses,  no organomegaly Extremities: extremities normal, atraumatic, no cyanosis or edema Neurologic: Alert and oriented X 3, normal strength and tone. Normal symmetric reflexes. Normal coordination and gait  ECOG PERFORMANCE STATUS: 0 - Asymptomatic  Blood pressure 114/75, pulse 89, temperature 98.5 F (36.9 C), resp. rate 18, height 4' 11" (1.499 m), weight 93 lb 4.8 oz (42.3 kg), SpO2 100 %.  LABORATORY DATA: Lab Results  Component Value Date   WBC 9.0 01/28/2019   HGB 15.2 (H) 01/28/2019   HCT 45.8 01/28/2019   MCV 92.7 01/28/2019   PLT 182 01/28/2019      Chemistry      Component Value Date/Time   NA 136 01/28/2019 1000   NA 139 06/16/2017 1132   K 4.4 01/28/2019 1000   K 4.7 06/16/2017 1132   CL 102 01/28/2019 1000   CO2 25 01/28/2019 1000   CO2 28 06/16/2017 1132   BUN 23 01/28/2019 1000   BUN 19.5 06/16/2017 1132   CREATININE 0.97   01/28/2019 1000   CREATININE 0.8 06/16/2017 1132      Component Value Date/Time   CALCIUM 9.5 01/28/2019 1000   CALCIUM 10.0 06/16/2017 1132   ALKPHOS 54 01/28/2019 1000   ALKPHOS 68 06/16/2017 1132   AST 23 01/28/2019 1000   AST 21 06/16/2017 1132   ALT 18 01/28/2019 1000   ALT 18 06/16/2017 1132   BILITOT 0.5 01/28/2019 1000   BILITOT 0.50 06/16/2017 1132       RADIOGRAPHIC STUDIES: Ct Chest W Contrast  Result Date: 01/28/2019 CLINICAL DATA:  Restaging lung cancer EXAM: CT CHEST WITH CONTRAST TECHNIQUE: Multidetector CT imaging of the chest was performed during intravenous contrast  administration. CONTRAST:  27m OMNIPAQUE IOHEXOL 300 MG/ML  SOLN COMPARISON:  11/26/2018, 05/29/2018 FINDINGS: Cardiovascular: Normal heart size. Left coronary artery calcifications. No pericardial effusion. Mediastinum/Nodes: No enlarged mediastinal, hilar, or axillary lymph nodes. Thyroid gland, trachea, and esophagus demonstrate no significant findings. Lungs/Pleura: Status post right upper lobectomy. There are numerous redemonstrated small bilateral pulmonary nodules, which are significantly decreased in size and number when compared to prior examination. Many nodules are totally resolved. The largest residual nodule in the right lower lobe measures 5 mm and this particular nodule is unchanged in comparison to prior (series 8, image 62). No pleural effusion or pneumothorax. Upper Abdomen: No acute abnormality. Musculoskeletal: No chest wall mass or suspicious bone lesions identified. Status post left shoulder arthroplasty. IMPRESSION: 1. Status post right upper lobectomy. There are numerous redemonstrated small bilateral pulmonary nodules, which are significantly decreased in size and number when compared to prior examination. Many nodules are totally resolved. The largest residual nodule in the right lower lobe measures 5 mm and this particular nodule is unchanged in comparison to prior. Findings are consistent with treatment response of nodular metastatic disease. 2.  Coronary artery disease. Electronically Signed   By: AEddie CandleM.D.   On: 01/28/2019 13:08    ASSESSMENT AND PLAN:  This is a very pleasant 68years old white female with stage IIIA non-small cell lung cancer, adenocarcinoma with positive EGFR mutation in exon 19 status post right upper lobectomy with lymph node dissection followed by adjuvant systemic chemotherapy as well as adjuvant radiation. The patient has been on observation for almost 3 years now.  She is feeling fine with no concerning complaints.   Unfortunately her scan in  June 2020 showed multiple tiny pulmonary nodules scattered throughout both lungs which many of them have increased in size and some new nodule suspected the previous scan. The patient started treatment with Tagrisso 80 mg p.o. daily and she has been tolerating it fairly well. The patient had repeat CT scan of the chest performed recently.  I personally and independently reviewed the scans and discussed the result with the patient today. PET scan showed significant improvement in her disease with decrease in the size as well as resolution of many of the pulmonary nodules. I recommended for the patient to continue her current treatment with Tagrisso with the same dose. I will see her back for follow-up visit in 6 weeks for evaluation and repeat blood work. She was advised to call immediately if she has any concerning symptoms in the interval. The patient voices understanding of current disease status and treatment options and is in agreement with the current care plan. All questions were answered. The patient knows to call the clinic with any problems, questions or concerns. We can certainly see the patient much sooner if necessary.  Disclaimer: This note was  dictated with voice recognition software. Similar sounding words can inadvertently be transcribed and may not be corrected upon review.       

## 2019-01-29 NOTE — Telephone Encounter (Signed)
Scheduled appt per 8/18 los - pt is aware of appt date and time

## 2019-02-01 ENCOUNTER — Other Ambulatory Visit: Payer: Self-pay | Admitting: Physician Assistant

## 2019-02-01 ENCOUNTER — Encounter: Payer: Self-pay | Admitting: Internal Medicine

## 2019-02-01 DIAGNOSIS — C3491 Malignant neoplasm of unspecified part of right bronchus or lung: Secondary | ICD-10-CM

## 2019-02-01 MED ORDER — OSIMERTINIB MESYLATE 80 MG PO TABS: 80.0000 mg | ORAL_TABLET | Freq: Every day | ORAL | 2 refills | Status: DC

## 2019-02-04 ENCOUNTER — Ambulatory Visit
Admission: RE | Admit: 2019-02-04 | Discharge: 2019-02-04 | Disposition: A | Discharge status: Home / Self Care | Payer: Medicare Other | Source: Ambulatory Visit | Attending: Family Medicine | Admitting: Family Medicine

## 2019-02-04 ENCOUNTER — Other Ambulatory Visit: Payer: Self-pay

## 2019-02-04 DIAGNOSIS — Z780 Asymptomatic menopausal state: Secondary | ICD-10-CM | POA: Diagnosis not present

## 2019-02-04 DIAGNOSIS — M85851 Other specified disorders of bone density and structure, right thigh: Secondary | ICD-10-CM | POA: Diagnosis not present

## 2019-02-04 DIAGNOSIS — Z1231 Encounter for screening mammogram for malignant neoplasm of breast: Secondary | ICD-10-CM

## 2019-02-04 DIAGNOSIS — M810 Age-related osteoporosis without current pathological fracture: Secondary | ICD-10-CM | POA: Diagnosis not present

## 2019-02-04 DIAGNOSIS — M8580 Other specified disorders of bone density and structure, unspecified site: Secondary | ICD-10-CM

## 2019-02-11 ENCOUNTER — Encounter: Payer: Self-pay | Admitting: Internal Medicine

## 2019-02-11 DIAGNOSIS — M810 Age-related osteoporosis without current pathological fracture: Secondary | ICD-10-CM | POA: Diagnosis not present

## 2019-02-11 DIAGNOSIS — I2510 Atherosclerotic heart disease of native coronary artery without angina pectoris: Secondary | ICD-10-CM | POA: Diagnosis not present

## 2019-02-25 MED FILL — TAGRISSO 80 MG TABLET: 80 | 30 days supply | Qty: 30 | Fill #0

## 2019-02-28 DIAGNOSIS — Z23 Encounter for immunization: Secondary | ICD-10-CM | POA: Diagnosis not present

## 2019-03-05 DIAGNOSIS — E782 Mixed hyperlipidemia: Secondary | ICD-10-CM | POA: Diagnosis not present

## 2019-03-06 LAB — NMR, LIPOPROFILE
Cholesterol, Total: 212 mg/dL — ABNORMAL HIGH (ref 100–199)
HDL Particle Number: 37.7 umol/L (ref >=30.5)
HDL-C: 100 mg/dL (ref >39)
LDL Particle Number: 854 nmol/L (ref <1000)
LDL Size: 21.1 nm (ref >20.5)
LDL-C (NIH Calc): 102 mg/dL — ABNORMAL HIGH (ref 0–99)
LP-IR Score: <25 (ref <=45)
Small LDL Particle Number: <90 nmol/L (ref <=527)
Triglycerides: 57 mg/dL (ref 0–149)

## 2019-03-13 ENCOUNTER — Inpatient Hospital Stay: Payer: Medicare Other | Attending: Internal Medicine | Admitting: Internal Medicine

## 2019-03-13 ENCOUNTER — Other Ambulatory Visit: Payer: Self-pay

## 2019-03-13 ENCOUNTER — Inpatient Hospital Stay: Payer: Medicare Other

## 2019-03-13 ENCOUNTER — Encounter: Payer: Self-pay | Admitting: Internal Medicine

## 2019-03-13 ENCOUNTER — Telehealth: Payer: Self-pay | Admitting: Internal Medicine

## 2019-03-13 VITALS — BP 116/79 | HR 95 | Temp 97.8°F | Resp 18 | Wt 92.0 lb

## 2019-03-13 DIAGNOSIS — R911 Solitary pulmonary nodule: Secondary | ICD-10-CM | POA: Diagnosis not present

## 2019-03-13 DIAGNOSIS — C3490 Malignant neoplasm of unspecified part of unspecified bronchus or lung: Secondary | ICD-10-CM

## 2019-03-13 DIAGNOSIS — C3491 Malignant neoplasm of unspecified part of right bronchus or lung: Secondary | ICD-10-CM

## 2019-03-13 DIAGNOSIS — R5383 Other fatigue: Secondary | ICD-10-CM | POA: Diagnosis not present

## 2019-03-13 DIAGNOSIS — Z5111 Encounter for antineoplastic chemotherapy: Secondary | ICD-10-CM

## 2019-03-13 DIAGNOSIS — R21 Rash and other nonspecific skin eruption: Secondary | ICD-10-CM | POA: Insufficient documentation

## 2019-03-13 DIAGNOSIS — C3411 Malignant neoplasm of upper lobe, right bronchus or lung: Secondary | ICD-10-CM | POA: Diagnosis not present

## 2019-03-13 DIAGNOSIS — M8580 Other specified disorders of bone density and structure, unspecified site: Secondary | ICD-10-CM | POA: Diagnosis not present

## 2019-03-13 LAB — CMP (CANCER CENTER ONLY)
ALT: 22 U/L (ref 0–44)
AST: 24 U/L (ref 15–41)
Albumin: 4.2 g/dL (ref 3.5–5.0)
Alkaline Phosphatase: 53 U/L (ref 38–126)
Anion gap: 10 (ref 5–15)
BUN: 25 mg/dL — ABNORMAL HIGH (ref 8–23)
CO2: 29 mmol/L (ref 22–32)
Calcium: 9.5 mg/dL (ref 8.9–10.3)
Chloride: 101 mmol/L (ref 98–111)
Creatinine: 1.01 mg/dL — ABNORMAL HIGH (ref 0.44–1.00)
GFR, Est AFR Am: >60 mL/min (ref >60)
GFR, Estimated: 57 mL/min — ABNORMAL LOW (ref >60)
Glucose, Bld: 86 mg/dL (ref 70–99)
Potassium: 4.5 mmol/L (ref 3.5–5.1)
Sodium: 140 mmol/L (ref 135–145)
Total Bilirubin: 0.4 mg/dL (ref 0.3–1.2)
Total Protein: 6.8 g/dL (ref 6.5–8.1)

## 2019-03-13 LAB — CBC WITH DIFFERENTIAL (CANCER CENTER ONLY)
Abs Immature Granulocytes: 0.01 10*3/uL (ref 0.00–0.07)
Basophils Absolute: 0 10*3/uL (ref 0.0–0.1)
Basophils Relative: 0 %
Eosinophils Absolute: 0.1 10*3/uL (ref 0.0–0.5)
Eosinophils Relative: 1 %
HCT: 42.8 % (ref 36.0–46.0)
Hemoglobin: 14.4 g/dL (ref 12.0–15.0)
Immature Granulocytes: 0 %
Lymphocytes Relative: 23 %
Lymphs Abs: 1.2 10*3/uL (ref 0.7–4.0)
MCH: 30.8 pg (ref 26.0–34.0)
MCHC: 33.6 g/dL (ref 30.0–36.0)
MCV: 91.5 fL (ref 80.0–100.0)
Monocytes Absolute: 0.7 10*3/uL (ref 0.1–1.0)
Monocytes Relative: 13 %
Neutro Abs: 3.4 10*3/uL (ref 1.7–7.7)
Neutrophils Relative %: 63 %
Platelet Count: 182 10*3/uL (ref 150–400)
RBC: 4.68 MIL/uL (ref 3.87–5.11)
RDW: 12.3 % (ref 11.5–15.5)
WBC Count: 5.4 10*3/uL (ref 4.0–10.5)
nRBC: 0 % (ref 0.0–0.2)

## 2019-03-13 LAB — MAGNESIUM: Magnesium: 2.1 mg/dL (ref 1.7–2.4)

## 2019-03-13 NOTE — Telephone Encounter (Signed)
Scheduled per los. Patient declined printout  

## 2019-03-13 NOTE — Progress Notes (Signed)
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  PRIOR THERAPY: 1) Right VATS with right upper lobectomy with bronchoplasty closure and en bloc resection of a wedge from the superior segment of the right lower lobe in addition to mediastinal lymph node dissection on 07/30/2015 under the care of Dr. Roxan Hockey. 2) Adjuvant systemic chemotherapy with cisplatin 75 MG/M2 and Alimta 500 MG/M2 every 3 weeks. First dose was given on 09/10/2015. Status post 4 cycles. 3) adjuvant radiotherapy under the care of Dr. Tammi Klippel.  CURRENT THERAPY: Tagrisso 80 mg p.o. daily started 12/01/2018.  Status post 4.5 months of treatment.  INTERVAL HISTORY: [name] West 68 y.o. female returns to the clinic today for follow-up visit.  The patient is feeling fine today with no concerning complaints except for mild fatigue.  She denied having any current chest pain, shortness of breath, cough or hemoptysis.  She denied having any fever or chills.  She has no nausea, vomiting, diarrhea or constipation.  She has no headache or visual changes.  She continues to tolerate her treatment with Tagrisso fairly well except for mild skin rash.  She is here today for evaluation and repeat blood work.  MEDICAL HISTORY: Past Medical History:  Diagnosis Date  . Anxiety   . Arthritis    shoulders  . Cancer ([place]) 06/2014   Lung Cancer  . Complication of anesthesia    nausea, does well with PROPOFOL   . Cough 09/17/2015  . Encounter for antineoplastic chemotherapy 10/22/2015  . Environmental and seasonal  allergies   . Family history of adverse reaction to anesthesia    Patients grandmother died while having hand surgery; pt unsure of cause  . Oral thrush 10/22/2015  . Ovarian cyst   . PONV (postoperative nausea and vomiting)   . Raynaud disease     ALLERGIES:  is allergic to avelox [moxifloxacin hcl in nacl]; cephalosporins; clindamycin/lincomycin; penicillins; macrobid [nitrofurantoin monohyd macro]; omnipaque [iohexol]; ciprofloxacin; adhesive [tape]; doxycycline; lidoderm [lidocaine]; pseudoephedrine; and sulfa antibiotics.  MEDICATIONS:  Current Outpatient Medications  Medication Sig Dispense Refill  . Azelaic Acid (FINACEA) 15 % cream Apply 1 application topically daily. After skin is thoroughly washed and patted dry, gently but thoroughly massage a thin film of azelaic acid cream into the affected area twice daily, in the morning and evening.     . cholecalciferol (VITAMIN D) 1000 UNITS tablet Take 1,000 Units by mouth daily.    . citalopram (CELEXA) 10 MG tablet Take 10 mg by mouth daily.    . fexofenadine (ALLEGRA) 180 MG tablet Take 180 mg by mouth daily.    Marland Kitchen ibuprofen (ADVIL,MOTRIN) 200 MG tablet Take 200 mg by mouth every 6 (six) hours as needed (pain).    . LORazepam (ATIVAN) 0.5 MG tablet Take 0.5 mg by mouth at bedtime as needed for anxiety or sleep.     . Multiple Vitamin (MULTIVITAMIN WITH MINERALS) TABS Take 1 tablet by mouth daily.    . NONFORMULARY OR COMPOUNDED ITEM Place 1 application vaginally 2 (two) times a week. Estradiol 0.02% Cream    . osimertinib mesylate (TAGRISSO) 80 MG tablet Take 1  tablet (80 mg total) by mouth daily. 30 tablet 2  . predniSONE (DELTASONE) 50 MG tablet Take one tablet 13 hours , one tablet 7 hours and 1 tablet 1 hours prior to CT scan for CT dye allergy. (Patient not taking: Reported on 01/29/2019) 3 tablet 3  . Wheat Dextrin (BENEFIBER) POWD Take 1 Dose by mouth daily.     No current facility-administered medications for this visit.      SURGICAL HISTORY:  Past Surgical History:  Procedure Laterality Date  . BREAST EXCISIONAL BIOPSY Left    1996  . CESAREAN SECTION  1984  . COLONOSCOPY WITH PROPOFOL N/A 09/27/2012   Procedure: COLONOSCOPY WITH PROPOFOL;  Surgeon: Garlan Fair, MD;  Location: [facility];  Service: Endoscopy;  Laterality: N/A;  . Fibroid Removed    . LAPAROTOMY Left 07/20/2016   Procedure: MINI LAPAROTOMY, REMOVAL OF LEFT OVARIAN CYST, LEFT SALPINGO-OOPHORECTOMY;  Surgeon: Dian Queen, MD;  [facility];  Service: Gynecology;  Laterality: Left;  please moved to 7:30am if spot opens  . Right Rotator Cuff Right   . TOTAL SHOULDER ARTHROPLASTY Left 06/14/2018   Procedure: TOTAL SHOULDER ARTHROPLASTY;  Surgeon: Justice Britain, MD;  Location: [facility];  Service: Orthopedics;  Laterality: Left;  110mn  . VIDEO ASSISTED THORACOSCOPY (VATS)/ LOBECTOMY Right 07/30/2015   Procedure: RIGHT VIDEO ASSISTED THORACOSCOPY (VATS)/RIGHT UPPER LOBECTOMY;  Surgeon: SMelrose Nakayama MD;  [facility]  Service: Thoracic;  Laterality: Right;  .Marland KitchenVIDEO BRONCHOSCOPY WITH ENDOBRONCHIAL NAVIGATION N/A 07/15/2015   Procedure: VIDEO BRONCHOSCOPY WITH ENDOBRONCHIAL NAVIGATION with Biopsy;  Surgeon: RCollene Gobble MD;  Location[facility];  Service: Thoracic;  Laterality: N/A;    REVIEW OF SYSTEMS:  A comprehensive review of systems was negative.   PHYSICAL EXAMINATION: General appearance: alert, cooperative and no distress Head: Normocephalic, without obvious abnormality, atraumatic Neck: no adenopathy, no JVD, supple, symmetrical, trachea midline and thyroid not enlarged, symmetric, no tenderness/mass/nodules Lymph nodes: Cervical, supraclavicular, and axillary nodes normal. Resp: clear to auscultation bilaterally Back: symmetric, no curvature. ROM normal. No CVA tenderness. Cardio: regular rate and rhythm, S1, S2 normal, no murmur, click, rub or gallop GI: soft, non-tender; bowel sounds normal; no masses,  no organomegaly  Extremities: extremities normal, atraumatic, no cyanosis or edema  ECOG PERFORMANCE STATUS: 0 - Asymptomatic  Blood pressure 116/79, pulse 95, temperature 97.8 F (36.6 C), temperature source Oral, resp. rate 18, weight 92 lb 0.7 oz (41.8 kg), SpO2 96 %.  LABORATORY DATA: Lab Results  Component Value Date   WBC 9.0 01/28/2019   HGB 15.2 (H) 01/28/2019   HCT 45.8 01/28/2019   MCV 92.7 01/28/2019   PLT 182 01/28/2019      Chemistry      Component Value Date/Time   NA 136 01/28/2019 1000   NA 139 06/16/2017 1132   K 4.4 01/28/2019 1000   K 4.7 06/16/2017 1132   CL 102 01/28/2019 1000   CO2 25 01/28/2019 1000   CO2 28 06/16/2017 1132   BUN 23 01/28/2019 1000   BUN 19.5 06/16/2017 1132   CREATININE 0.97 01/28/2019 1000   CREATININE 0.8 06/16/2017 1132      Component Value Date/Time   CALCIUM 9.5 01/28/2019 1000   CALCIUM 10.0 06/16/2017 1132   ALKPHOS 54 01/28/2019 1000   ALKPHOS 68 06/16/2017 1132   AST 23 01/28/2019 1000   AST 21 06/16/2017 1132   ALT 18 01/28/2019 1000   ALT 18 06/16/2017 1132   BILITOT 0.5 01/28/2019 1000  BILITOT 0.50 06/16/2017 1132       RADIOGRAPHIC STUDIES: No results found.  ASSESSMENT AND PLAN:  This is a very pleasant 68 years old white female with stage IIIA non-small cell lung cancer, adenocarcinoma with positive EGFR mutation in exon 19 status post right upper lobectomy with lymph node dissection followed by adjuvant systemic chemotherapy as well as adjuvant radiation. The patient has been on observation for almost 3 years now.  She is feeling fine with no concerning complaints.   Unfortunately her scan in June 2020 showed multiple tiny pulmonary nodules scattered throughout both lungs which many of them have increased in size and some new nodule suspected the previous scan. The patient started treatment with Tagrisso 80 mg p.o. daily status post more than 4 months of treatment.   The patient continues to tolerate this treatment well  with no concerning complaints. I recommended for the patient to continue her current treatment with Tagrisso with the same dose. I will see her back for follow-up visit in 6 weeks for evaluation with repeat CT scan of the chest for restaging of her disease. She was advised to call immediately if she has any concerning symptoms in the interval. The patient voices understanding of current disease status and treatment options and is in agreement with the current care plan. All questions were answered. The patient knows to call the clinic with any problems, questions or concerns. We can certainly see the patient much sooner if necessary.  Disclaimer: This note was dictated with voice recognition software. Similar sounding words can inadvertently be transcribed and may not be corrected upon review.

## 2019-03-14 ENCOUNTER — Encounter: Payer: Self-pay | Admitting: Internal Medicine

## 2019-03-14 ENCOUNTER — Ambulatory Visit (INDEPENDENT_AMBULATORY_CARE_PROVIDER_SITE_OTHER): Payer: Medicare Other | Admitting: Internal Medicine

## 2019-03-14 VITALS — BP 110/70 | HR 79 | Ht 59.0 in | Wt 93.0 lb

## 2019-03-14 DIAGNOSIS — I2510 Atherosclerotic heart disease of native coronary artery without angina pectoris: Secondary | ICD-10-CM | POA: Diagnosis not present

## 2019-03-14 DIAGNOSIS — I2584 Coronary atherosclerosis due to calcified coronary lesion: Secondary | ICD-10-CM

## 2019-03-14 DIAGNOSIS — E782 Mixed hyperlipidemia: Secondary | ICD-10-CM | POA: Diagnosis not present

## 2019-03-14 NOTE — Progress Notes (Signed)
OFFICE CONSULT NOTE  Chief Complaint:  Follow-up  Primary Care Physician: [name], MD  HPI:  [name] West is a 68 y.o. female who is being seen today for the evaluation of coronary artery calcification at the request of [name], MD.  This is a pleasant 68 year old female with unfortunate history of non-small cell lung cancer.  She underwent lobectomy by Dr. Roxan Hockey in 2017 with radiation chemotherapy.  Fortunately she seems to be cancer free with some recurrent scans.  Recently she is developed some inguinal lymphadenopathy.  She is on antibiotics for this however her is concerned about a neoplastic process.  Her most recent screening CT scan of the chest did demonstrate coronary artery calcification both in the LAD as well as aortic atherosclerosis which was calcified.  Family history is significant for a brother 71 years older who has coronary artery disease and has had an MI and CABG (however she notes he is live with HIV disease on HAART for more than 30 years).  She denies any history of diabetes or hypertension in fact does not take any daily medications.  Her last lipid profile was November 2018 which showed total cholesterol 210, HDL 81, triglycerides 65 and LDL 116.  She is asymptomatic from an anginal standpoint, but does report shortness of breath with exertion.  She felt like this may mostly be related to her lung cancer and the fact that she has had partial lung removal.  12/22/2017  [name] West returns today for follow-up of her stress echocardiogram.  Fortunately this was a negative study without any exercise-induced wall motion abnormalities and normal LV function.  Overall she feels well except for ongoing shoulder problems.  She is scheduled for sold shoulder surgery in January.  She is supposed to have another CT scan of her chest in December.  We discussed the fact that she does have coronary disease as evidenced by her coronary artery calcifications.  Although  her stress test was low risk, her goal LDL is less than 70.  Currently her LDL is 116.  Given her body habitus, thin weight and fairly healthy diet, is unlikely she is going get a significant increased improvement in her numbers.  I did repeat a lipid profile which was an NMR.  Her LDL-C is 110, and her LDL-P is 1151.  Total cholesterol is 219 and LPA was negative at 21.   03/14/2019  [name] West is seen today in follow-up.  She did undergo successful left shoulder replacement.  Overall she is pleased with that.  She is scheduled to have another CT scan in November of her chest for follow-up of cancer.  This had shown coronary calcification and I recommended statin therapy.  She has been somewhat hesitant to do that.  Recently we repeated an NMR lipid profile.  Her total particle number went from 1151 down to 854.  LDL still remains elevated 102 and HDL was 100, triglycerides 57.  PMHx:  Past Medical History:  Diagnosis Date  . Anxiety   . Arthritis    shoulders  . Cancer ([place]) 06/2014   Lung Cancer  . Complication of anesthesia    nausea, does well with PROPOFOL   . Cough 09/17/2015  . Encounter for antineoplastic chemotherapy 10/22/2015  . Environmental and seasonal allergies   . Family history of adverse reaction to anesthesia    Patients grandmother died while having hand surgery; pt unsure of cause  . Oral thrush 10/22/2015  . Ovarian cyst   .  PONV (postoperative nausea and vomiting)   . Raynaud disease     Past Surgical History:  Procedure Laterality Date  . BREAST EXCISIONAL BIOPSY Left    1996  . CESAREAN SECTION  1984  . COLONOSCOPY WITH PROPOFOL N/A 09/27/2012   Procedure: COLONOSCOPY WITH PROPOFOL;  Surgeon: Garlan Fair, MD;  Location: [facility];  Service: Endoscopy;  Laterality: N/A;  . Fibroid Removed    . LAPAROTOMY Left 07/20/2016   Procedure: MINI LAPAROTOMY, REMOVAL OF LEFT OVARIAN CYST, LEFT SALPINGO-OOPHORECTOMY;  Surgeon: Dian Queen, MD;  Location: [facility];   Service: Gynecology;  Laterality: Left;  please moved to 7:30am if spot opens  . Right Rotator Cuff Right   . TOTAL SHOULDER ARTHROPLASTY Left 06/14/2018   Procedure: TOTAL SHOULDER ARTHROPLASTY;  Surgeon: Justice Britain, MD;  Location: [facility];  Service: Orthopedics;  Laterality: Left;  158min  . VIDEO ASSISTED THORACOSCOPY (VATS)/ LOBECTOMY Right 07/30/2015   Procedure: RIGHT VIDEO ASSISTED THORACOSCOPY (VATS)/RIGHT UPPER LOBECTOMY;  Surgeon: Melrose Nakayama, MD;  Location: [facility];  Service: Thoracic;  Laterality: Right;  [name] West VIDEO BRONCHOSCOPY WITH ENDOBRONCHIAL NAVIGATION N/A 07/15/2015   Procedure: VIDEO BRONCHOSCOPY WITH ENDOBRONCHIAL NAVIGATION with Biopsy;  Surgeon: Collene Gobble, MD;  Location: [facility];  Service: Thoracic;  Laterality: N/A;    FAMHx:  Family History  Problem Relation Age of Onset  . Heart disease Brother   . Heart attack Brother     SOCHx:   reports that she has never smoked. She has never used smokeless tobacco. She reports current alcohol use. She reports that she does not use drugs.  ALLERGIES:  Allergies  Allergen Reactions  . Avelox [Moxifloxacin Hcl In Nacl] Anaphylaxis  . Cephalosporins Anaphylaxis  . Clindamycin/Lincomycin Anaphylaxis  . Penicillins Anaphylaxis and Other (See Comments)    Has patient had a PCN reaction causing immediate rash, facial/tongue/throat swelling, SOB or lightheadedness with hypotension: Yes Has patient had a PCN reaction causing severe rash involving mucus membranes or skin necrosis: No Has patient had a PCN reaction that required hospitalization Yes Has patient had a PCN reaction occurring within the last 10 years: No If all of the above answers are "NO", then may proceed with Cephalosporin use.   [name] West Macrobid [Nitrofurantoin Monohyd Macro] Other (See Comments)    CHEST TIGHTNESS...[name] KitchenHAD USED BEFORE WITH NO REACTION  . Omnipaque [Iohexol] Hives  . Ciprofloxacin Other (See Comments)    Unknown  . Adhesive [Tape] Rash  .  Doxycycline Other (See Comments)    Frequently urination   . Lidoderm [Lidocaine] Rash  . Pseudoephedrine Palpitations  . Sulfa Antibiotics Rash    ROS: Pertinent items noted in HPI and remainder of comprehensive ROS otherwise negative.  HOME MEDS: Current Outpatient Medications on File Prior to Visit  Medication Sig Dispense Refill  . Azelaic Acid (FINACEA) 15 % cream Apply 1 application topically daily. After skin is thoroughly washed and patted dry, gently but thoroughly massage a thin film of azelaic acid cream into the affected area twice daily, in the morning and evening.     . cholecalciferol (VITAMIN D) 1000 UNITS tablet Take 1,000 Units by mouth daily.    . citalopram (CELEXA) 10 MG tablet Take 10 mg by mouth daily.    . fexofenadine (ALLEGRA) 180 MG tablet Take 180 mg by mouth daily.    [name] West ibuprofen (ADVIL,MOTRIN) 200 MG tablet Take 200 mg by mouth every 6 (six) hours as needed (pain).    . LORazepam (ATIVAN) 0.5 MG tablet Take  0.5 mg by mouth at bedtime as needed for anxiety or sleep.     . Multiple Vitamin (MULTIVITAMIN WITH MINERALS) TABS Take 1 tablet by mouth daily.    . NONFORMULARY OR COMPOUNDED ITEM Place 1 application vaginally 2 (two) times a week. Estradiol 0.02% Cream    . osimertinib mesylate (TAGRISSO) 80 MG tablet Take 1 tablet (80 mg total) by mouth daily. 30 tablet 2  . predniSONE (DELTASONE) 50 MG tablet Take one tablet 13 hours , one tablet 7 hours and 1 tablet 1 hours prior to CT scan for CT dye allergy. 3 tablet 3  . Wheat Dextrin (BENEFIBER) POWD Take 1 Dose by mouth daily.     No current facility-administered medications on file prior to visit.     LABS/IMAGING: Results for orders placed or performed in visit on 03/13/19 (from the past 48 hour(s))  CMP ([place] only)     Status: Abnormal   Collection Time: 03/13/19  8:13 AM  Result Value Ref Range   Sodium 140 135 - 145 mmol/L   Potassium 4.5 3.5 - 5.1 mmol/L   Chloride 101 98 - 111 mmol/L    CO2 29 22 - 32 mmol/L   Glucose, Bld 86 70 - 99 mg/dL   BUN 25 (H) 8 - 23 mg/dL   Creatinine 1.01 (H) 0.44 - 1.00 mg/dL   Calcium 9.5 8.9 - 10.3 mg/dL   Total Protein 6.8 6.5 - 8.1 g/dL   Albumin 4.2 3.5 - 5.0 g/dL   AST 24 15 - 41 U/L   ALT 22 0 - 44 U/L   Alkaline Phosphatase 53 38 - 126 U/L   Total Bilirubin 0.4 0.3 - 1.2 mg/dL   GFR, Est Non Af Am 57 (L) >60 mL/min   GFR, Est AFR Am >60 >60 mL/min   Anion gap 10 5 - 15    Comment: Performed at [facility], [address], [place], [place] [address]  CBC with Differential ([place] Only)     Status: None   Collection Time: 03/13/19  8:13 AM  Result Value Ref Range   WBC Count 5.4 4.0 - 10.5 K/uL   RBC 4.68 3.87 - 5.11 MIL/uL   Hemoglobin 14.4 12.0 - 15.0 g/dL   HCT 42.8 36.0 - 46.0 %   MCV 91.5 80.0 - 100.0 fL   MCH 30.8 26.0 - 34.0 pg   MCHC 33.6 30.0 - 36.0 g/dL   RDW 12.3 11.5 - 15.5 %   Platelet Count 182 150 - 400 K/uL   nRBC 0.0 0.0 - 0.2 %   Neutrophils Relative % 63 %   Neutro Abs 3.4 1.7 - 7.7 K/uL   Lymphocytes Relative 23 %   Lymphs Abs 1.2 0.7 - 4.0 K/uL   Monocytes Relative 13 %   Monocytes Absolute 0.7 0.1 - 1.0 K/uL   Eosinophils Relative 1 %   Eosinophils Absolute 0.1 0.0 - 0.5 K/uL   Basophils Relative 0 %   Basophils Absolute 0.0 0.0 - 0.1 K/uL   Immature Granulocytes 0 %   Abs Immature Granulocytes 0.01 0.00 - 0.07 K/uL    Comment: Performed at [facility], [place] [address], [place], [place] [address]  Magnesium     Status: None   Collection Time: 03/13/19  8:13 AM  Result Value Ref Range   Magnesium 2.1 1.7 - 2.4 mg/dL    Comment: Performed at [facility], [name]  [place], [place] [address]   No results found.  LIPID PANEL: No results found for: CHOL, TRIG, HDL, CHOLHDL, VLDL, LDLCALC, LDLDIRECT  WEIGHTS: Wt Readings from Last 3 Encounters:  03/14/19 93 lb (42.2 kg)  03/13/19 92 lb 0.7 oz (41.8 kg)   01/29/19 93 lb 4.8 oz (42.3 kg)    VITALS: BP 110/70 (BP Location: Left Arm, Patient Position: Sitting, Cuff Size: Normal)   Pulse 79   Ht 4\' 11"  (1.499 m)   Wt 93 lb (42.2 kg)   BMI 18.78 kg/m   EXAM: General appearance: alert and no distress Neck: no carotid bruit, no JVD and thyroid not enlarged, symmetric, no tenderness/mass/nodules Lungs: clear to auscultation bilaterally Heart: regular rate and rhythm, S1, S2 normal, no murmur, click, rub or gallop Abdomen: soft, non-tender; bowel sounds normal; no masses,  no organomegaly Extremities: extremities normal, atraumatic, no cyanosis or edema Pulses: 2+ and symmetric Skin: Skin color, texture, turgor normal. No rashes or lesions Neurologic: Grossly normal Psych: Pleasant  EKG: Normal sinus rhythm at 79, low voltage QRS-personally reviewed  ASSESSMENT: 1. Coronary artery calcification-LAD and aortic atherosclerosis (negative stress echocardiogram (03/2018)) 2. 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 Family history of coronary artery disease 4. Dyslipidemia-goal LDL less than 70  PLAN: 1.   Ms. Criscione has had interval improvement in her LDL particle numbers however LDL remains above goal of less than 70.  I do think she benefit from statin therapy but she is hesitant to do this.  Would therefore recommend continued exercise, dietary modification and monitoring.  Plan a repeat lipid profile in 6 to 12 months.  There is no indication for serial coronary calcium testing in the guidelines.  Pixie Casino, MD, [facility], [place] Director of the Advanced Lipid Disorders &  Cardiovascular Risk Reduction Clinic Diplomate of the American Board of Clinical Lipidology Attending Cardiologist  Direct Dial: [phone]  Fax: [phone]  Website:  [email or web address]  03/14/2019, 9:24 AM

## 2019-03-14 NOTE — Patient Instructions (Signed)
Medication Instructions:  Your physician recommends that you continue on your current medications as directed. Please refer to the Current Medication list given to you today.  If you need a refill on your cardiac medications before your next appointment, please call your pharmacy.    Follow-Up: At [facility], you and your health needs are our priority.  As part of our continuing mission to provide you with exceptional heart care, we have created designated Provider Care Teams.  These Care Teams include your primary Cardiologist (physician) and Advanced Practice Providers (APPs -  Physician Assistants and Nurse Practitioners) who all work together to provide you with the care you need, when you need it. You will need a follow up appointment in 12 months.  Please call our office 2 months in advance to schedule this appointment.  You may see Kenneth C Hilty, MD or one of the following Advanced Practice Providers on your designated Care Team: Hao Meng, PA-C . Angela Duke, PA-C  Any Other Special Instructions Will Be Listed Below (If Applicable).    

## 2019-03-26 MED FILL — TAGRISSO 80 MG TABLET: 80 | 30 days supply | Qty: 30 | Fill #1

## 2019-04-17 ENCOUNTER — Encounter: Payer: Self-pay | Admitting: Internal Medicine

## 2019-04-18 ENCOUNTER — Telehealth: Payer: Self-pay | Admitting: *Deleted

## 2019-04-18 NOTE — Telephone Encounter (Signed)
Late Entry 04/17/2019 - called pt 11/4 after review with MD regarding concernt o jaw. Pt was given antibiotic from PCP, MD instructed pt to finish course of antibiotics if not better to call office. Pt verbalized understanding. No further concerns.

## 2019-04-19 DIAGNOSIS — R599 Enlarged lymph nodes, unspecified: Secondary | ICD-10-CM | POA: Diagnosis not present

## 2019-04-19 DIAGNOSIS — C3491 Malignant neoplasm of unspecified part of right bronchus or lung: Secondary | ICD-10-CM | POA: Diagnosis not present

## 2019-04-19 DIAGNOSIS — H6091 Unspecified otitis externa, right ear: Secondary | ICD-10-CM | POA: Diagnosis not present

## 2019-04-23 ENCOUNTER — Encounter (HOSPITAL_COMMUNITY): Payer: Self-pay

## 2019-04-23 ENCOUNTER — Other Ambulatory Visit: Payer: Self-pay

## 2019-04-23 ENCOUNTER — Ambulatory Visit (HOSPITAL_COMMUNITY)
Admission: RE | Admit: 2019-04-23 | Discharge: 2019-04-23 | Disposition: A | Discharge status: Home / Self Care | Payer: Medicare Other | Source: Ambulatory Visit | Attending: Internal Medicine | Admitting: Internal Medicine

## 2019-04-23 ENCOUNTER — Inpatient Hospital Stay: Payer: Medicare Other | Attending: Internal Medicine

## 2019-04-23 DIAGNOSIS — C3490 Malignant neoplasm of unspecified part of unspecified bronchus or lung: Secondary | ICD-10-CM | POA: Insufficient documentation

## 2019-04-23 DIAGNOSIS — C3411 Malignant neoplasm of upper lobe, right bronchus or lung: Secondary | ICD-10-CM | POA: Insufficient documentation

## 2019-04-23 LAB — CBC WITH DIFFERENTIAL (CANCER CENTER ONLY)
Abs Immature Granulocytes: 0.01 10*3/uL (ref 0.00–0.07)
Basophils Absolute: 0 10*3/uL (ref 0.0–0.1)
Basophils Relative: 0 %
Eosinophils Absolute: 0 10*3/uL (ref 0.0–0.5)
Eosinophils Relative: 0 %
HCT: 45.3 % (ref 36.0–46.0)
Hemoglobin: 15.1 g/dL — ABNORMAL HIGH (ref 12.0–15.0)
Immature Granulocytes: 0 %
Lymphocytes Relative: 5 %
Lymphs Abs: 0.4 10*3/uL — ABNORMAL LOW (ref 0.7–4.0)
MCH: 31 pg (ref 26.0–34.0)
MCHC: 33.3 g/dL (ref 30.0–36.0)
MCV: 93 fL (ref 80.0–100.0)
Monocytes Absolute: 0.1 10*3/uL (ref 0.1–1.0)
Monocytes Relative: 2 %
Neutro Abs: 6.4 10*3/uL (ref 1.7–7.7)
Neutrophils Relative %: 93 %
Platelet Count: 185 10*3/uL (ref 150–400)
RBC: 4.87 MIL/uL (ref 3.87–5.11)
RDW: 12.5 % (ref 11.5–15.5)
WBC Count: 6.9 10*3/uL (ref 4.0–10.5)
nRBC: 0 % (ref 0.0–0.2)

## 2019-04-23 LAB — CMP (CANCER CENTER ONLY)
ALT: 22 U/L (ref 0–44)
AST: 26 U/L (ref 15–41)
Albumin: 4.5 g/dL (ref 3.5–5.0)
Alkaline Phosphatase: 65 U/L (ref 38–126)
Anion gap: 11 (ref 5–15)
BUN: 25 mg/dL — ABNORMAL HIGH (ref 8–23)
CO2: 26 mmol/L (ref 22–32)
Calcium: 9.6 mg/dL (ref 8.9–10.3)
Chloride: 99 mmol/L (ref 98–111)
Creatinine: 1.03 mg/dL — ABNORMAL HIGH (ref 0.44–1.00)
GFR, Est AFR Am: >60 mL/min (ref >60)
GFR, Estimated: 55 mL/min — ABNORMAL LOW (ref >60)
Glucose, Bld: 178 mg/dL — ABNORMAL HIGH (ref 70–99)
Potassium: 4.5 mmol/L (ref 3.5–5.1)
Sodium: 136 mmol/L (ref 135–145)
Total Bilirubin: 0.4 mg/dL (ref 0.3–1.2)
Total Protein: 7.5 g/dL (ref 6.5–8.1)

## 2019-04-23 MED ORDER — IOHEXOL 300 MG/ML  SOLN
75.0000 mL | Freq: Once | INTRAMUSCULAR | Status: AC | PRN
  Administered 2019-04-23: 75 mL via INTRAVENOUS

## 2019-04-23 MED ORDER — SODIUM CHLORIDE (PF) 0.9 % IJ SOLN
INTRAMUSCULAR | Status: AC
  Filled 2019-04-23: qty 50

## 2019-04-24 ENCOUNTER — Inpatient Hospital Stay (HOSPITAL_BASED_OUTPATIENT_CLINIC_OR_DEPARTMENT_OTHER): Payer: Medicare Other | Admitting: Internal Medicine

## 2019-04-24 ENCOUNTER — Other Ambulatory Visit: Payer: Self-pay

## 2019-04-24 ENCOUNTER — Telehealth: Payer: Self-pay | Admitting: Internal Medicine

## 2019-04-24 ENCOUNTER — Encounter: Payer: Self-pay | Admitting: Internal Medicine

## 2019-04-24 VITALS — BP 115/67 | HR 98 | Temp 98.2°F | Resp 18 | Ht 59.0 in | Wt 94.0 lb

## 2019-04-24 DIAGNOSIS — I2510 Atherosclerotic heart disease of native coronary artery without angina pectoris: Secondary | ICD-10-CM | POA: Diagnosis not present

## 2019-04-24 DIAGNOSIS — Z5111 Encounter for antineoplastic chemotherapy: Secondary | ICD-10-CM | POA: Diagnosis not present

## 2019-04-24 DIAGNOSIS — I2584 Coronary atherosclerosis due to calcified coronary lesion: Secondary | ICD-10-CM | POA: Diagnosis not present

## 2019-04-24 DIAGNOSIS — C3411 Malignant neoplasm of upper lobe, right bronchus or lung: Secondary | ICD-10-CM | POA: Diagnosis not present

## 2019-04-24 DIAGNOSIS — C3491 Malignant neoplasm of unspecified part of right bronchus or lung: Secondary | ICD-10-CM | POA: Diagnosis not present

## 2019-04-24 MED FILL — TAGRISSO 80 MG TABLET: 80 | 30 days supply | Qty: 30 | Fill #2

## 2019-04-24 NOTE — Progress Notes (Signed)
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  PRIOR THERAPY: 1) Right VATS with right upper lobectomy with bronchoplasty closure and en bloc resection of a wedge from the superior segment of the right lower lobe in addition to mediastinal lymph node dissection on 07/30/2015 under the care of Dr. Roxan Hockey. 2) Adjuvant systemic chemotherapy with cisplatin 75 MG/M2 and Alimta 500 MG/M2 every 3 weeks. First dose was given on 09/10/2015. Status post 4 cycles. 3) adjuvant radiotherapy under the care of Dr. Tammi Klippel.  CURRENT THERAPY: Tagrisso 80 mg p.o. daily started 12/01/2018.  Status post 5 months of treatment.  INTERVAL HISTORY: [name] West 68 y.o. female returns to the clinic today for follow-up visit accompanied by her husband.  The patient is feeling fine today with no concerning complaints.  She was recently treated for ear infection and she had enlarged lymph node in the submandibular area that now completely resolved after her treatment with the doxycycline and prednisone premedication for the CT scan.  She denied having any current chest pain, shortness of breath, cough or hemoptysis.  She denied having any fever or chills.  She has no nausea, vomiting, diarrhea or constipation.  She has no headache or visual changes.  The patient had repeat CT scan of the chest performed recently and she is here for evaluation and discussion of her scan results.   MEDICAL HISTORY: Past Medical History:  Diagnosis Date  . Anxiety   . Arthritis    shoulders  . Cancer ([place])  06/2014   Lung Cancer  . Complication of anesthesia    nausea, does well with PROPOFOL   . Cough 09/17/2015  . Encounter for antineoplastic chemotherapy 10/22/2015  . Environmental and seasonal allergies   . Family history of adverse reaction to anesthesia    Patients grandmother died while having hand surgery; pt unsure of cause  . Oral thrush 10/22/2015  . Ovarian cyst   . PONV (postoperative nausea and vomiting)   . Raynaud disease     ALLERGIES:  is allergic to avelox [moxifloxacin hcl in nacl]; cephalosporins; clindamycin/lincomycin; penicillins; macrobid [nitrofurantoin monohyd macro]; omnipaque [iohexol]; ciprofloxacin; adhesive [tape]; doxycycline; lidoderm [lidocaine]; pseudoephedrine; and sulfa antibiotics.  MEDICATIONS:  Current Outpatient Medications  Medication Sig Dispense Refill  . Azelaic Acid (FINACEA) 15 % cream Apply 1 application topically daily. After skin is thoroughly washed and patted dry, gently but thoroughly massage a thin film of azelaic acid cream into the affected area twice daily, in the morning and evening.     . cholecalciferol (VITAMIN D) 1000 UNITS tablet Take 1,000 Units by mouth daily.    . citalopram (CELEXA) 10 MG tablet Take 10 mg by mouth daily.    . fexofenadine (ALLEGRA) 180 MG tablet Take 180 mg by mouth daily.    Marland Kitchen ibuprofen (ADVIL,MOTRIN) 200 MG tablet Take 200 mg by mouth every 6 (six) hours as needed (pain).    . LORazepam (ATIVAN) 0.5 MG tablet Take 0.5 mg by mouth at bedtime as needed for anxiety or sleep.     . Multiple Vitamin (MULTIVITAMIN WITH MINERALS) TABS Take 1 tablet by mouth  daily.    . NONFORMULARY OR COMPOUNDED ITEM Place 1 application vaginally 2 (two) times a week. Estradiol 0.02% Cream    . osimertinib mesylate (TAGRISSO) 80 MG tablet Take 1 tablet (80 mg total) by mouth daily. 30 tablet 2  . predniSONE (DELTASONE) 50 MG tablet Take one tablet 13 hours , one tablet 7 hours and 1 tablet 1 hours prior to CT scan for CT dye  allergy. 3 tablet 3  . Wheat Dextrin (BENEFIBER) POWD Take 1 Dose by mouth daily.     No current facility-administered medications for this visit.     SURGICAL HISTORY:  Past Surgical History:  Procedure Laterality Date  . BREAST EXCISIONAL BIOPSY Left    1996  . CESAREAN SECTION  1984  . COLONOSCOPY WITH PROPOFOL N/A 09/27/2012   Procedure: COLONOSCOPY WITH PROPOFOL;  Surgeon: Garlan Fair, MD;  Location: [facility];  Service: Endoscopy;  Laterality: N/A;  . Fibroid Removed    . LAPAROTOMY Left 07/20/2016   Procedure: MINI LAPAROTOMY, REMOVAL OF LEFT OVARIAN CYST, LEFT SALPINGO-OOPHORECTOMY;  Surgeon: Dian Queen, MD;  Location: [facility];  Service: Gynecology;  Laterality: Left;  please moved to 7:30am if spot opens  . Right Rotator Cuff Right   . TOTAL SHOULDER ARTHROPLASTY Left 06/14/2018   Procedure: TOTAL SHOULDER ARTHROPLASTY;  Surgeon: Justice Britain, MD;  Location: [facility];  Service: Orthopedics;  Laterality: Left;  160mn  . VIDEO ASSISTED THORACOSCOPY (VATS)/ LOBECTOMY Right 07/30/2015   Procedure: RIGHT VIDEO ASSISTED THORACOSCOPY (VATS)/RIGHT UPPER LOBECTOMY;  Surgeon: SMelrose Nakayama MD;  Location[facility]  Service: Thoracic;  Laterality: Right;  .Marland KitchenVIDEO BRONCHOSCOPY WITH ENDOBRONCHIAL NAVIGATION N/A 07/15/2015   Procedure: VIDEO BRONCHOSCOPY WITH ENDOBRONCHIAL NAVIGATION with Biopsy;  Surgeon: RCollene Gobble MD;  [facility] [facility]  Service: Thoracic;  Laterality: N/A;    REVIEW OF SYSTEMS:  Constitutional: negative Eyes: negative Ears, nose, mouth, throat, and face: negative Respiratory: negative Cardiovascular: negative Gastrointestinal: negative Genitourinary:negative Integument/breast: negative Hematologic/lymphatic: negative Musculoskeletal:negative Neurological: negative Behavioral/Psych: negative Endocrine: negative Allergic/Immunologic: negative   PHYSICAL EXAMINATION: General appearance: alert, cooperative and no distress Head: Normocephalic, without  obvious abnormality, atraumatic Neck: no adenopathy, no JVD, supple, symmetrical, trachea midline and thyroid not enlarged, symmetric, no tenderness/mass/nodules Lymph nodes: Cervical, supraclavicular, and axillary nodes normal. Resp: clear to auscultation bilaterally Back: symmetric, no curvature. ROM normal. No CVA tenderness. Cardio: regular rate and rhythm, S1, S2 normal, no murmur, click, rub or gallop GI: soft, non-tender; bowel sounds normal; no masses,  no organomegaly Extremities: extremities normal, atraumatic, no cyanosis or edema Neurologic: Alert and oriented X 3, normal strength and tone. Normal symmetric reflexes. Normal coordination and gait  ECOG PERFORMANCE STATUS: 0 - Asymptomatic  Blood pressure 115/67, pulse 98, temperature 98.2 F (36.8 C), temperature source Temporal, resp. rate 18, height '4\' 11"'$  (1.499 m), weight 94 lb (42.6 kg), SpO2 100 %.  LABORATORY DATA: Lab Results  Component Value Date   WBC 6.9 04/23/2019   HGB 15.1 (H) 04/23/2019   HCT 45.3 04/23/2019   MCV 93.0 04/23/2019   PLT 185 04/23/2019      Chemistry      Component Value Date/Time   NA 136 04/23/2019 0959   NA 139 06/16/2017 1132   K 4.5 04/23/2019 0959   K 4.7 06/16/2017 1132   CL 99 04/23/2019 0959   CO2 26 04/23/2019 0959   CO2 28 06/16/2017 1132   BUN 25 (H) 04/23/2019 0959   BUN 19.5 06/16/2017 1132   CREATININE  1.03 (H) 04/23/2019 0959   CREATININE 0.8 06/16/2017 1132      Component Value Date/Time   CALCIUM 9.6 04/23/2019 0959   CALCIUM 10.0 06/16/2017 1132   ALKPHOS 65 04/23/2019 0959   ALKPHOS 68 06/16/2017 1132   AST 26 04/23/2019 0959   AST 21 06/16/2017 1132   ALT 22 04/23/2019 0959   ALT 18 06/16/2017 1132   BILITOT 0.4 04/23/2019 0959   BILITOT 0.50 06/16/2017 1132       RADIOGRAPHIC STUDIES: Ct Chest W Contrast  Result Date: 04/23/2019 CLINICAL DATA:  Non-small cell lung cancer, lung cancer diagnosed in 2017 right upper lobectomy followed by chemo  radiation. Chemo and radiation, presented in 2017 with treatment initiated in orally of 2017. EXAM: CT CHEST WITH CONTRAST TECHNIQUE: Multidetector CT imaging of the chest was performed during intravenous contrast administration. CONTRAST:  48m OMNIPAQUE IOHEXOL 300 MG/ML  SOLN COMPARISON:  01/28/2019 FINDINGS: Cardiovascular: Heart size is normal. No pericardial effusion. Aortic caliber and contour is unremarkable. Distortion of right hilar structures in the setting partial lung resection, right upper lobectomy similar to prior exam. Central pulmonary vasculature is otherwise unremarkable. Mediastinum/Nodes: No signs of adenopathy in the chest. Lungs/Pleura: Signs of partial lung resection in the right as before. Scattered small pulmonary nodules without change compared to prior study. Dominant nodules are seen in the right lower lobe (image 85, series 7) 6 mm right lower lobe nodule previously 6 mm as measured by this observer on the prior study as well as a study of 11/26/2018. Also along the pleural surface a second pulmonary nodule (image 71, series 7) measuring 5 mm is unchanged. Upper Abdomen: No signs of acute upper abdominal process. Musculoskeletal: Signs of left shoulder arthroplasty with streak artifact about the left upper chest. No signs of acute musculoskeletal finding or destructive bone process. IMPRESSION: 1. No cause for shortness of breath identified. 2. Postoperative changes in the right hemithorax as above. 3. Scattered small pulmonary nodules are stable in the interval. Recommend attention on follow-up. Electronically Signed   By: GZetta BillsM.D.   On: 04/23/2019 13:28    ASSESSMENT AND PLAN:  This is a very pleasant 68years old white female with stage IIIA non-small cell lung cancer, adenocarcinoma with positive EGFR mutation in exon 19 status post right upper lobectomy with lymph node dissection followed by adjuvant systemic chemotherapy as well as adjuvant radiation. The patient  has been on observation for almost 3 years now.  She is feeling fine with no concerning complaints.   Unfortunately her scan in June 2020 showed multiple tiny pulmonary nodules scattered throughout both lungs which many of them have increased in size and some new nodule suspected the previous scan. The patient started treatment with Tagrisso 80 mg p.o. daily status post more than 5 months of treatment.   The patient has been tolerating her treatment well with no concerning adverse effects. She had repeat CT scan of the chest performed recently.  I personally and independently reviewed the scans and discussed the results with the patient today. Her scan showed no concerning findings for disease progression. I recommended for her to continue her current treatment with Tagrisso with the same dose. She will come back for follow-up visit in 2 months for evaluation and repeat blood work. She was advised to call immediately if she has any concerning symptoms in the interval. The patient voices understanding of current disease status and treatment options and is in agreement with the current care plan. All  questions were answered. The patient knows to call the clinic with any problems, questions or concerns. We can certainly see the patient much sooner if necessary.  Disclaimer: This note was dictated with voice recognition software. Similar sounding words can inadvertently be transcribed and may not be corrected upon review.

## 2019-04-24 NOTE — Telephone Encounter (Signed)
Scheduled per 11/11 los, patient will receive scheduling updates on My chart.

## 2019-05-03 DIAGNOSIS — I2510 Atherosclerotic heart disease of native coronary artery without angina pectoris: Secondary | ICD-10-CM | POA: Diagnosis not present

## 2019-05-03 DIAGNOSIS — M8580 Other specified disorders of bone density and structure, unspecified site: Secondary | ICD-10-CM | POA: Diagnosis not present

## 2019-05-03 DIAGNOSIS — M810 Age-related osteoporosis without current pathological fracture: Secondary | ICD-10-CM | POA: Diagnosis not present

## 2019-05-03 DIAGNOSIS — C3491 Malignant neoplasm of unspecified part of right bronchus or lung: Secondary | ICD-10-CM | POA: Diagnosis not present

## 2019-05-21 ENCOUNTER — Other Ambulatory Visit: Payer: Self-pay | Admitting: Physician Assistant

## 2019-05-21 DIAGNOSIS — C3491 Malignant neoplasm of unspecified part of right bronchus or lung: Secondary | ICD-10-CM

## 2019-05-23 MED FILL — TAGRISSO 80 MG TABLET: 80 | 30 days supply | Qty: 30 | Fill #0

## 2019-06-24 MED FILL — TAGRISSO 80 MG TABLET: 80 | 30 days supply | Qty: 30 | Fill #1

## 2019-06-25 ENCOUNTER — Other Ambulatory Visit: Payer: Self-pay

## 2019-06-25 ENCOUNTER — Encounter: Payer: Self-pay | Admitting: Internal Medicine

## 2019-06-25 ENCOUNTER — Inpatient Hospital Stay: Payer: Medicare Other | Attending: Internal Medicine

## 2019-06-25 ENCOUNTER — Telehealth: Payer: Self-pay | Admitting: Internal Medicine

## 2019-06-25 ENCOUNTER — Inpatient Hospital Stay (HOSPITAL_BASED_OUTPATIENT_CLINIC_OR_DEPARTMENT_OTHER): Payer: Medicare Other | Admitting: Internal Medicine

## 2019-06-25 VITALS — BP 121/56 | HR 87 | Temp 97.9°F | Resp 18 | Ht 59.0 in | Wt 92.5 lb

## 2019-06-25 DIAGNOSIS — C3490 Malignant neoplasm of unspecified part of unspecified bronchus or lung: Secondary | ICD-10-CM | POA: Diagnosis not present

## 2019-06-25 DIAGNOSIS — Z5111 Encounter for antineoplastic chemotherapy: Secondary | ICD-10-CM

## 2019-06-25 DIAGNOSIS — C3411 Malignant neoplasm of upper lobe, right bronchus or lung: Secondary | ICD-10-CM | POA: Diagnosis not present

## 2019-06-25 DIAGNOSIS — Z923 Personal history of irradiation: Secondary | ICD-10-CM | POA: Diagnosis not present

## 2019-06-25 DIAGNOSIS — C781 Secondary malignant neoplasm of mediastinum: Secondary | ICD-10-CM | POA: Diagnosis not present

## 2019-06-25 DIAGNOSIS — C3491 Malignant neoplasm of unspecified part of right bronchus or lung: Secondary | ICD-10-CM

## 2019-06-25 LAB — CBC WITH DIFFERENTIAL (CANCER CENTER ONLY)
Abs Immature Granulocytes: 0.01 10*3/uL (ref 0.00–0.07)
Basophils Absolute: 0 10*3/uL (ref 0.0–0.1)
Basophils Relative: 1 %
Eosinophils Absolute: 0.1 10*3/uL (ref 0.0–0.5)
Eosinophils Relative: 1 %
HCT: 43.5 % (ref 36.0–46.0)
Hemoglobin: 14.6 g/dL (ref 12.0–15.0)
Immature Granulocytes: 0 %
Lymphocytes Relative: 22 %
Lymphs Abs: 1.1 10*3/uL (ref 0.7–4.0)
MCH: 31.3 pg (ref 26.0–34.0)
MCHC: 33.6 g/dL (ref 30.0–36.0)
MCV: 93.1 fL (ref 80.0–100.0)
Monocytes Absolute: 0.7 10*3/uL (ref 0.1–1.0)
Monocytes Relative: 14 %
Neutro Abs: 3.1 10*3/uL (ref 1.7–7.7)
Neutrophils Relative %: 62 %
Platelet Count: 188 10*3/uL (ref 150–400)
RBC: 4.67 MIL/uL (ref 3.87–5.11)
RDW: 12.5 % (ref 11.5–15.5)
WBC Count: 5 10*3/uL (ref 4.0–10.5)
nRBC: 0 % (ref 0.0–0.2)

## 2019-06-25 LAB — CMP (CANCER CENTER ONLY)
ALT: 16 U/L (ref 0–44)
AST: 23 U/L (ref 15–41)
Albumin: 4.1 g/dL (ref 3.5–5.0)
Alkaline Phosphatase: 54 U/L (ref 38–126)
Anion gap: 11 (ref 5–15)
BUN: 24 mg/dL — ABNORMAL HIGH (ref 8–23)
CO2: 26 mmol/L (ref 22–32)
Calcium: 9.2 mg/dL (ref 8.9–10.3)
Chloride: 103 mmol/L (ref 98–111)
Creatinine: 0.98 mg/dL (ref 0.44–1.00)
GFR, Est AFR Am: >60 mL/min (ref >60)
GFR, Estimated: 59 mL/min — ABNORMAL LOW (ref >60)
Glucose, Bld: 92 mg/dL (ref 70–99)
Potassium: 5.2 mmol/L — ABNORMAL HIGH (ref 3.5–5.1)
Sodium: 140 mmol/L (ref 135–145)
Total Bilirubin: 0.4 mg/dL (ref 0.3–1.2)
Total Protein: 6.7 g/dL (ref 6.5–8.1)

## 2019-06-25 NOTE — Progress Notes (Signed)
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  PRIOR THERAPY: 1) Right VATS with right upper lobectomy with bronchoplasty closure and en bloc resection of a wedge from the superior segment of the right lower lobe in addition to mediastinal lymph node dissection on 07/30/2015 under the care of Dr. Roxan Hockey. 2) Adjuvant systemic chemotherapy with cisplatin 75 MG/M2 and Alimta 500 MG/M2 every 3 weeks. First dose was given on 09/10/2015. Status post 4 cycles. 3) adjuvant radiotherapy under the care of Dr. Tammi Klippel.  CURRENT THERAPY: Tagrisso 80 mg p.o. daily started 12/01/2018.  Status post 7 months of treatment.  INTERVAL HISTORY: [name] West 69 y.o. female returns to the clinic today for follow-up visit.  The patient is feeling fine today with no concerning complaints.  She denied having any chest pain, shortness of breath, cough or hemoptysis.  She denied having any fever or chills.  She has no rash or diarrhea but continues to have some nail changes.  The patient denied having any recent weight loss or night sweats.  She has no headache or visual changes.  She continues to tolerate her treatment with Tagrisso fairly well.  MEDICAL HISTORY: Past Medical History:  Diagnosis Date  . Anxiety   . Arthritis    shoulders  . Cancer ([place]) 06/2014   Lung Cancer  . Complication of anesthesia    nausea, does well with PROPOFOL   . Cough 09/17/2015  . Encounter for antineoplastic chemotherapy 10/22/2015  . Environmental and seasonal allergies   . Family history of adverse  reaction to anesthesia    Patients grandmother died while having hand surgery; pt unsure of cause  . Oral thrush 10/22/2015  . Ovarian cyst   . PONV (postoperative nausea and vomiting)   . Raynaud disease     ALLERGIES:  is allergic to avelox [moxifloxacin hcl in nacl]; cephalosporins; clindamycin/lincomycin; penicillins; macrobid [nitrofurantoin monohyd macro]; omnipaque [iohexol]; ciprofloxacin; adhesive [tape]; doxycycline; lidoderm [lidocaine]; pseudoephedrine; and sulfa antibiotics.  MEDICATIONS:  Current Outpatient Medications  Medication Sig Dispense Refill  . cholecalciferol (VITAMIN D) 1000 UNITS tablet Take 1,000 Units by mouth daily.    . citalopram (CELEXA) 10 MG tablet Take 10 mg by mouth daily.    . fexofenadine (ALLEGRA) 180 MG tablet Take 180 mg by mouth daily.    Marland Kitchen ibuprofen (ADVIL,MOTRIN) 200 MG tablet Take 200 mg by mouth every 6 (six) hours as needed (pain).    . LORazepam (ATIVAN) 0.5 MG tablet Take 0.5 mg by mouth at bedtime as needed for anxiety or sleep.     . Multiple Vitamin (MULTIVITAMIN WITH MINERALS) TABS Take 1 tablet by mouth daily.    . NONFORMULARY OR COMPOUNDED ITEM Place 1 application vaginally 2 (two) times a week. Estradiol 0.02% Cream    . predniSONE (DELTASONE) 50 MG tablet Take one tablet 13 hours , one tablet 7 hours and 1 tablet 1 hours prior to CT scan for CT dye allergy. 3 tablet 3  . TAGRISSO 80 MG tablet TAKE 1 TABLET (80 MG TOTAL) BY MOUTH DAILY. 30 tablet 2  . Wheat Dextrin (BENEFIBER) POWD Take 1 Dose  by mouth daily.    . Azelaic Acid (FINACEA) 15 % cream Apply 1 application topically daily. After skin is thoroughly washed and patted dry, gently but thoroughly massage a thin film of azelaic acid cream into the affected area twice daily, in the morning and evening.     . neomycin-polymyxin-hydrocortisone (CORTISPORIN) 3.5-10000-1 OTIC suspension Place 4 drops into both ears 4 (four) times daily.     No current facility-administered medications  for this visit.    SURGICAL HISTORY:  Past Surgical History:  Procedure Laterality Date  . BREAST EXCISIONAL BIOPSY Left    1996  . CESAREAN SECTION  1984  . COLONOSCOPY WITH PROPOFOL N/A 09/27/2012   Procedure: COLONOSCOPY WITH PROPOFOL;  Surgeon: Garlan Fair, MD;  [facility];  Service: Endoscopy;  Laterality: N/A;  . Fibroid Removed    . LAPAROTOMY Left 07/20/2016   Procedure: MINI LAPAROTOMY, REMOVAL OF LEFT OVARIAN CYST, LEFT SALPINGO-OOPHORECTOMY;  Surgeon: Dian Queen, MD;  Location: [facility];  Service: Gynecology;  Laterality: Left;  please moved to 7:30am if spot opens  . Right Rotator Cuff Right   . TOTAL SHOULDER ARTHROPLASTY Left 06/14/2018   Procedure: TOTAL SHOULDER ARTHROPLASTY;  Surgeon: Justice Britain, MD;  Location: [facility];  Service: Orthopedics;  Laterality: Left;  124mn  . VIDEO ASSISTED THORACOSCOPY (VATS)/ LOBECTOMY Right 07/30/2015   Procedure: RIGHT VIDEO ASSISTED THORACOSCOPY (VATS)/RIGHT UPPER LOBECTOMY;  Surgeon: SMelrose Nakayama MD;  [facility]  Service: Thoracic;  Laterality: Right;  .Marland KitchenVIDEO BRONCHOSCOPY WITH ENDOBRONCHIAL NAVIGATION N/A 07/15/2015   Procedure: VIDEO BRONCHOSCOPY WITH ENDOBRONCHIAL NAVIGATION with Biopsy;  Surgeon: RCollene Gobble MD;  [facility];  Service: Thoracic;  Laterality: N/A;    REVIEW OF SYSTEMS:  A comprehensive review of systems was negative except for: Integument/breast: positive for dryness and Nail changes   PHYSICAL EXAMINATION: General appearance: alert, cooperative and no distress Head: Normocephalic, without obvious abnormality, atraumatic Neck: no adenopathy, no JVD, supple, symmetrical, trachea midline and thyroid not enlarged, symmetric, no tenderness/mass/nodules Lymph nodes: Cervical, supraclavicular, and axillary nodes normal. Resp: clear to auscultation bilaterally Back: symmetric, no curvature. ROM normal. No CVA tenderness. Cardio: regular rate and rhythm, S1, S2 normal, no murmur,  click, rub or gallop GI: soft, non-tender; bowel sounds normal; no masses,  no organomegaly Extremities: extremities normal, atraumatic, no cyanosis or edema  ECOG PERFORMANCE STATUS: 0 - Asymptomatic  Blood pressure (!) 121/56, pulse 87, temperature 97.9 F (36.6 C), temperature source Temporal, resp. rate 18, height 4' 11"  (1.499 m), weight 92 lb 8 oz (42 kg), SpO2 99 %.  LABORATORY DATA: Lab Results  Component Value Date   WBC 5.0 06/25/2019   HGB 14.6 06/25/2019   HCT 43.5 06/25/2019   MCV 93.1 06/25/2019   PLT 188 06/25/2019      Chemistry      Component Value Date/Time   NA 140 06/25/2019 0845   NA 139 06/16/2017 1132   K 5.2 (H) 06/25/2019 0845   K 4.7 06/16/2017 1132   CL 103 06/25/2019 0845   CO2 26 06/25/2019 0845   CO2 28 06/16/2017 1132   BUN 24 (H) 06/25/2019 0845   BUN 19.5 06/16/2017 1132   CREATININE 0.98 06/25/2019 0845   CREATININE 0.8 06/16/2017 1132      Component Value Date/Time   CALCIUM 9.2 06/25/2019 0845   CALCIUM 10.0 06/16/2017 1132   ALKPHOS 54 06/25/2019 0845   ALKPHOS 68 06/16/2017 1132   AST 23 06/25/2019 0845   AST  21 06/16/2017 1132   ALT 16 06/25/2019 0845   ALT 18 06/16/2017 1132   BILITOT 0.4 06/25/2019 0845   BILITOT 0.50 06/16/2017 1132       RADIOGRAPHIC STUDIES: No results found.  ASSESSMENT AND PLAN:  This is a very pleasant 69 years old white female with stage IIIA non-small cell lung cancer, adenocarcinoma with positive EGFR mutation in exon 19 status post right upper lobectomy with lymph node dissection followed by adjuvant systemic chemotherapy as well as adjuvant radiation. The patient has been on observation for almost 3 years now.  She is feeling fine with no concerning complaints.   Unfortunately her scan in June 2020 showed multiple tiny pulmonary nodules scattered throughout both lungs which many of them have increased in size and some new nodule suspected the previous scan. The patient started treatment with  Tagrisso 80 mg p.o. daily status post 7 months of treatment.   The patient continues to tolerate this treatment well with no concerning complaints except for mild dryness and some mild nail changes. I recommended for the patient to continue her current treatment with Tagrisso with the same dose. I will see her back for follow-up visit in 6 weeks for evaluation with repeat CT scan of the chest, abdomen pelvis for restaging of her disease. The patient was advised to call immediately if she has any concerning symptoms in the interval. The patient voices understanding of current disease status and treatment options and is in agreement with the current care plan. All questions were answered. The patient knows to call the clinic with any problems, questions or concerns. We can certainly see the patient much sooner if necessary.  Disclaimer: This note was dictated with voice recognition software. Similar sounding words can inadvertently be transcribed and may not be corrected upon review.

## 2019-06-25 NOTE — Telephone Encounter (Signed)
Patient decline avs and calendar °

## 2019-06-28 ENCOUNTER — Encounter: Payer: Self-pay | Admitting: Internal Medicine

## 2019-07-02 ENCOUNTER — Telehealth: Payer: Self-pay | Admitting: Medical Oncology

## 2019-07-02 NOTE — Telephone Encounter (Signed)
labs and scan adjusted due to pt has COVID vaccine appt and has to premed with high dose prednisone which may suppress immune system and possible make vaccine less effective.

## 2019-07-02 NOTE — Telephone Encounter (Signed)
-----   Message from Curt Bears, MD sent at 06/28/2019  7:16 PM EST ----- Regarding: RE: question about vaccine We can do her scan either 1 week earlier or 1 week later to avoid the timing with the high-dose prednisone.  Definitely prednisone can suppress the immune system and probably the response to the vaccine.  Thank you. ----- Message ----- From: Ardeen Garland, RN Sent: 06/28/2019   2:14 PM EST To: Curt Bears, MD Subject: question about vaccine                         [name] West Onc Nurse Cc Phone Number: [phone] Good morning,  I signed up for my first Covid vaccination on 2/2. Three weeks later (2/23)would be the same day at my CT Scan.   Does prednisone or any of the contrast interfere with the vaccination? I assume that there is a day or two flexibility on when to get the booster, and of course if I receive Moderna there isn't an issue.   Thanks  [employer]

## 2019-07-04 ENCOUNTER — Ambulatory Visit: Payer: Medicare Other | Attending: Internal Medicine

## 2019-07-04 DIAGNOSIS — Z23 Encounter for immunization: Secondary | ICD-10-CM | POA: Insufficient documentation

## 2019-07-04 NOTE — Progress Notes (Signed)
   Covid-19 Vaccination Clinic  Name:  SHALAYNE LEACH    MRN: [id number] DOB: [birth year]  07/04/2019  Ms. Ausburn was observed post Covid-19 immunization for 15 minutes without incidence. She was provided with Vaccine Information Sheet and instruction to access the V-Safe system.   Ms. Noguera was instructed to call 911 with any severe reactions post vaccine: Marland Kitchen Difficulty breathing  . Swelling of your face and throat  . A fast heartbeat  . A bad rash all over your body  . Dizziness and weakness    Immunizations Administered    Name Date Dose VIS Date Route   Pfizer COVID-19 Vaccine 07/04/2019  9:24 AM 0.3 mL 05/24/2019 Intramuscular   Manufacturer: [place]   Lot: [id number]   [place]: [id number]

## 2019-07-16 ENCOUNTER — Ambulatory Visit: Payer: Medicare Other

## 2019-07-22 DIAGNOSIS — R1030 Lower abdominal pain, unspecified: Secondary | ICD-10-CM | POA: Diagnosis not present

## 2019-07-24 MED FILL — TAGRISSO 80 MG TABLET: 80 | 30 days supply | Qty: 30 | Fill #2

## 2019-07-25 ENCOUNTER — Ambulatory Visit: Payer: Medicare Other | Attending: Internal Medicine

## 2019-07-25 DIAGNOSIS — Z23 Encounter for immunization: Secondary | ICD-10-CM | POA: Insufficient documentation

## 2019-07-25 DIAGNOSIS — L301 Dyshidrosis [pompholyx]: Secondary | ICD-10-CM | POA: Diagnosis not present

## 2019-07-25 DIAGNOSIS — R1032 Left lower quadrant pain: Secondary | ICD-10-CM | POA: Diagnosis not present

## 2019-07-25 DIAGNOSIS — J310 Chronic rhinitis: Secondary | ICD-10-CM | POA: Diagnosis not present

## 2019-07-25 NOTE — Progress Notes (Signed)
   Covid-19 Vaccination Clinic  Name:  JANAYE CORP    MRN: [id number] DOB: April 06, 1951  07/25/2019  Ms. Rahrig was observed post Covid-19 immunization for 15 minutes without incidence. She was provided with Vaccine Information Sheet and instruction to access the V-Safe system.   Ms. Brager was instructed to call 911 with any severe reactions post vaccine: Marland Kitchen Difficulty breathing  . Swelling of your face and throat  . A fast heartbeat  . A bad rash all over your body  . Dizziness and weakness    Immunizations Administered    Name Date Dose VIS Date Route   Pfizer COVID-19 Vaccine 07/25/2019  9:01 AM 0.3 mL 05/24/2019 Intramuscular   Manufacturer: [place]   Lot: [id number]   [place]: [id number]

## 2019-08-01 DIAGNOSIS — C3491 Malignant neoplasm of unspecified part of right bronchus or lung: Secondary | ICD-10-CM | POA: Diagnosis not present

## 2019-08-01 DIAGNOSIS — I2510 Atherosclerotic heart disease of native coronary artery without angina pectoris: Secondary | ICD-10-CM | POA: Diagnosis not present

## 2019-08-01 DIAGNOSIS — M810 Age-related osteoporosis without current pathological fracture: Secondary | ICD-10-CM | POA: Diagnosis not present

## 2019-08-01 DIAGNOSIS — M8580 Other specified disorders of bone density and structure, unspecified site: Secondary | ICD-10-CM | POA: Diagnosis not present

## 2019-08-06 ENCOUNTER — Ambulatory Visit (HOSPITAL_COMMUNITY): Payer: Medicare Other

## 2019-08-06 ENCOUNTER — Other Ambulatory Visit: Payer: Medicare Other

## 2019-08-07 ENCOUNTER — Ambulatory Visit: Payer: Medicare Other | Admitting: Internal Medicine

## 2019-08-12 ENCOUNTER — Encounter: Payer: Self-pay | Admitting: Internal Medicine

## 2019-08-14 ENCOUNTER — Encounter: Payer: Self-pay | Admitting: Medical Oncology

## 2019-08-15 ENCOUNTER — Encounter (HOSPITAL_COMMUNITY): Payer: Self-pay

## 2019-08-15 ENCOUNTER — Other Ambulatory Visit: Payer: Self-pay | Admitting: Physician Assistant

## 2019-08-15 ENCOUNTER — Inpatient Hospital Stay: Payer: Medicare Other | Attending: Internal Medicine

## 2019-08-15 ENCOUNTER — Ambulatory Visit (HOSPITAL_COMMUNITY)
Admission: RE | Admit: 2019-08-15 | Discharge: 2019-08-15 | Disposition: A | Discharge status: Home / Self Care | Payer: Medicare Other | Source: Ambulatory Visit | Attending: Internal Medicine | Admitting: Internal Medicine

## 2019-08-15 ENCOUNTER — Other Ambulatory Visit: Payer: Self-pay

## 2019-08-15 DIAGNOSIS — C3411 Malignant neoplasm of upper lobe, right bronchus or lung: Secondary | ICD-10-CM | POA: Diagnosis not present

## 2019-08-15 DIAGNOSIS — C3490 Malignant neoplasm of unspecified part of unspecified bronchus or lung: Secondary | ICD-10-CM | POA: Diagnosis not present

## 2019-08-15 DIAGNOSIS — C3491 Malignant neoplasm of unspecified part of right bronchus or lung: Secondary | ICD-10-CM

## 2019-08-15 DIAGNOSIS — I2510 Atherosclerotic heart disease of native coronary artery without angina pectoris: Secondary | ICD-10-CM | POA: Diagnosis not present

## 2019-08-15 DIAGNOSIS — Z5111 Encounter for antineoplastic chemotherapy: Secondary | ICD-10-CM | POA: Diagnosis not present

## 2019-08-15 DIAGNOSIS — R918 Other nonspecific abnormal finding of lung field: Secondary | ICD-10-CM | POA: Diagnosis not present

## 2019-08-15 LAB — CBC WITH DIFFERENTIAL (CANCER CENTER ONLY)
Abs Immature Granulocytes: 0.03 10*3/uL (ref 0.00–0.07)
Basophils Absolute: 0 10*3/uL (ref 0.0–0.1)
Basophils Relative: 0 %
Eosinophils Absolute: 0 10*3/uL (ref 0.0–0.5)
Eosinophils Relative: 0 %
HCT: 44.6 % (ref 36.0–46.0)
Hemoglobin: 14.8 g/dL (ref 12.0–15.0)
Immature Granulocytes: 1 %
Lymphocytes Relative: 12 %
Lymphs Abs: 0.7 10*3/uL (ref 0.7–4.0)
MCH: 30.6 pg (ref 26.0–34.0)
MCHC: 33.2 g/dL (ref 30.0–36.0)
MCV: 92.3 fL (ref 80.0–100.0)
Monocytes Absolute: 0.3 10*3/uL (ref 0.1–1.0)
Monocytes Relative: 4 %
Neutro Abs: 5.2 10*3/uL (ref 1.7–7.7)
Neutrophils Relative %: 83 %
Platelet Count: 257 10*3/uL (ref 150–400)
RBC: 4.83 MIL/uL (ref 3.87–5.11)
RDW: 12.6 % (ref 11.5–15.5)
WBC Count: 6.2 10*3/uL (ref 4.0–10.5)
nRBC: 0 % (ref 0.0–0.2)

## 2019-08-15 LAB — CMP (CANCER CENTER ONLY)
ALT: 23 U/L (ref 0–44)
AST: 27 U/L (ref 15–41)
Albumin: 3.9 g/dL (ref 3.5–5.0)
Alkaline Phosphatase: 84 U/L (ref 38–126)
Anion gap: 11 (ref 5–15)
BUN: 28 mg/dL — ABNORMAL HIGH (ref 8–23)
CO2: 27 mmol/L (ref 22–32)
Calcium: 9.8 mg/dL (ref 8.9–10.3)
Chloride: 100 mmol/L (ref 98–111)
Creatinine: 0.99 mg/dL (ref 0.44–1.00)
GFR, Est AFR Am: >60 mL/min (ref >60)
GFR, Estimated: 58 mL/min — ABNORMAL LOW (ref >60)
Glucose, Bld: 153 mg/dL — ABNORMAL HIGH (ref 70–99)
Potassium: 5.1 mmol/L (ref 3.5–5.1)
Sodium: 138 mmol/L (ref 135–145)
Total Bilirubin: 0.3 mg/dL (ref 0.3–1.2)
Total Protein: 7.2 g/dL (ref 6.5–8.1)

## 2019-08-15 MED ORDER — SODIUM CHLORIDE (PF) 0.9 % IJ SOLN
INTRAMUSCULAR | Status: AC
  Filled 2019-08-15: qty 50

## 2019-08-15 MED ORDER — IOHEXOL 300 MG/ML  SOLN
100.0000 mL | Freq: Once | INTRAMUSCULAR | Status: AC | PRN
  Administered 2019-08-15: 75 mL via INTRAVENOUS

## 2019-08-19 ENCOUNTER — Telehealth: Payer: Self-pay | Admitting: Internal Medicine

## 2019-08-19 ENCOUNTER — Other Ambulatory Visit: Payer: Self-pay

## 2019-08-19 ENCOUNTER — Encounter: Payer: Self-pay | Admitting: Internal Medicine

## 2019-08-19 ENCOUNTER — Inpatient Hospital Stay (HOSPITAL_BASED_OUTPATIENT_CLINIC_OR_DEPARTMENT_OTHER): Payer: Medicare Other | Admitting: Internal Medicine

## 2019-08-19 VITALS — BP 113/57 | HR 99 | Temp 96.2°F | Resp 17 | Ht 59.0 in | Wt 90.6 lb

## 2019-08-19 DIAGNOSIS — R918 Other nonspecific abnormal finding of lung field: Secondary | ICD-10-CM | POA: Diagnosis not present

## 2019-08-19 DIAGNOSIS — C3491 Malignant neoplasm of unspecified part of right bronchus or lung: Secondary | ICD-10-CM | POA: Diagnosis not present

## 2019-08-19 DIAGNOSIS — Z5111 Encounter for antineoplastic chemotherapy: Secondary | ICD-10-CM | POA: Diagnosis not present

## 2019-08-19 DIAGNOSIS — C3411 Malignant neoplasm of upper lobe, right bronchus or lung: Secondary | ICD-10-CM | POA: Diagnosis not present

## 2019-08-19 DIAGNOSIS — I2510 Atherosclerotic heart disease of native coronary artery without angina pectoris: Secondary | ICD-10-CM

## 2019-08-19 DIAGNOSIS — I2584 Coronary atherosclerosis due to calcified coronary lesion: Secondary | ICD-10-CM | POA: Diagnosis not present

## 2019-08-19 MED FILL — TAGRISSO 80 MG TABLET: 80 | 30 days supply | Qty: 30 | Fill #0

## 2019-08-19 NOTE — Telephone Encounter (Signed)
Scheduled per 03/08 los, patient will be notified of appointment per My chart.

## 2019-08-19 NOTE — Progress Notes (Signed)
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  PRIOR THERAPY: 1) Right VATS with right upper lobectomy with bronchoplasty closure and en bloc resection of a wedge from the superior segment of the right lower lobe in addition to mediastinal lymph node dissection on 07/30/2015 under the care of Dr. Roxan Hockey. 2) Adjuvant systemic chemotherapy with cisplatin 75 MG/M2 and Alimta 500 MG/M2 every 3 weeks. First dose was given on 09/10/2015. Status post 4 cycles. 3) adjuvant radiotherapy under the care of Dr. Tammi Klippel.  CURRENT THERAPY: Tagrisso 80 mg p.o. daily started 12/01/2018.  Status post 9 months of treatment.  INTERVAL HISTORY: [name] West 69 y.o. female returns to the clinic today for follow-up visit.  The patient was accompanied by her husband.  She is feeling fine today with no concerning complaints.  She denied having any rash or diarrhea.  She denied having any current chest pain, shortness of breath, cough or hemoptysis.  She denied having any fever or chills.  She has no nausea, vomiting, diarrhea or constipation.  She denied having any significant weight loss or night sweats.  She has been tolerating her treatment with Tagrisso fairly well.  The patient had repeat CT scan of the chest, abdomen pelvis performed recently and she is here for evaluation and discussion of her scan results.  MEDICAL HISTORY: Past Medical History:  Diagnosis Date  . Anxiety   . Arthritis    shoulders  . Cancer ([place]) 06/2014   Lung Cancer  . Complication of anesthesia    nausea,  does well with PROPOFOL   . Cough 09/17/2015  . Encounter for antineoplastic chemotherapy 10/22/2015  . Environmental and seasonal allergies   . Family history of adverse reaction to anesthesia    Patients grandmother died while having hand surgery; pt unsure of cause  . Oral thrush 10/22/2015  . Ovarian cyst   . PONV (postoperative nausea and vomiting)   . Raynaud disease     ALLERGIES:  is allergic to avelox [moxifloxacin hcl in nacl]; cephalosporins; clindamycin/lincomycin; penicillins; macrobid [nitrofurantoin monohyd macro]; omnipaque [iohexol]; ciprofloxacin; adhesive [tape]; doxycycline; lidoderm [lidocaine]; pseudoephedrine; and sulfa antibiotics.  MEDICATIONS:  Current Outpatient Medications  Medication Sig Dispense Refill  . Azelastine HCl 0.15 % SOLN Inhale 1 spray into the lungs daily.    . Clobetasol Prop Oint-Coal Tar (CLOBETAPLUS OINTMENT EX) Apply 1 Dose topically daily.    . Probiotic Product (ALIGN) 4 MG CAPS Take 1 capsule by mouth daily.    . Azelaic Acid (FINACEA) 15 % cream Apply 1 application topically daily. After skin is thoroughly washed and patted dry, gently but thoroughly massage a thin film of azelaic acid cream into the affected area twice daily, in the morning and evening.     . cholecalciferol (VITAMIN D) 1000 UNITS tablet Take 1,000 Units by mouth daily.    . citalopram (CELEXA) 10 MG tablet Take 10 mg by mouth daily.    Marland Kitchen ibuprofen (ADVIL,MOTRIN) 200 MG tablet Take 200 mg by mouth every 6 (six) hours as needed (pain).    . LORazepam (ATIVAN) 0.5 MG tablet Take 0.5 mg by mouth  at bedtime as needed for anxiety or sleep.     . Multiple Vitamin (MULTIVITAMIN WITH MINERALS) TABS Take 1 tablet by mouth daily.    Marland Kitchen neomycin-polymyxin-hydrocortisone (CORTISPORIN) 3.5-10000-1 OTIC suspension Place 4 drops into both ears 4 (four) times daily.    . NONFORMULARY OR COMPOUNDED ITEM Place 1 application vaginally 2 (two) times a week. Estradiol 0.02% Cream    . predniSONE  (DELTASONE) 50 MG tablet Take one tablet 13 hours , one tablet 7 hours and 1 tablet 1 hours prior to CT scan for CT dye allergy. 3 tablet 3  . TAGRISSO 80 MG tablet TAKE 1 TABLET (80 MG TOTAL) BY MOUTH DAILY. 30 tablet 2   No current facility-administered medications for this visit.    SURGICAL HISTORY:  Past Surgical History:  Procedure Laterality Date  . BREAST EXCISIONAL BIOPSY Left    1996  . CESAREAN SECTION  1984  . COLONOSCOPY WITH PROPOFOL N/A 09/27/2012   Procedure: COLONOSCOPY WITH PROPOFOL;  Surgeon: Garlan Fair, MD;  Location: [facility];  Service: Endoscopy;  Laterality: N/A;  . Fibroid Removed    . LAPAROTOMY Left 07/20/2016   Procedure: MINI LAPAROTOMY, REMOVAL OF LEFT OVARIAN CYST, LEFT SALPINGO-OOPHORECTOMY;  Surgeon: Dian Queen, MD;  Location: [facility];  Service: Gynecology;  Laterality: Left;  please moved to 7:30am if spot opens  . Right Rotator Cuff Right   . TOTAL SHOULDER ARTHROPLASTY Left 06/14/2018   Procedure: TOTAL SHOULDER ARTHROPLASTY;  Surgeon: Justice Britain, MD;  Location: [facility];  Service: Orthopedics;  Laterality: Left;  14mn  . VIDEO ASSISTED THORACOSCOPY (VATS)/ LOBECTOMY Right 07/30/2015   Procedure: RIGHT VIDEO ASSISTED THORACOSCOPY (VATS)/RIGHT UPPER LOBECTOMY;  Surgeon: SMelrose Nakayama MD;  Location: [facility]  Service: Thoracic;  Laterality: Right;  .Marland KitchenVIDEO BRONCHOSCOPY WITH ENDOBRONCHIAL NAVIGATION N/A 07/15/2015   Procedure: VIDEO BRONCHOSCOPY WITH ENDOBRONCHIAL NAVIGATION with Biopsy;  Surgeon: RCollene Gobble MD;  [facility]  Service: Thoracic;  Laterality: N/A;    REVIEW OF SYSTEMS:  Constitutional: negative Eyes: negative Ears, nose, mouth, throat, and face: negative Respiratory: negative Cardiovascular: negative Gastrointestinal: negative Genitourinary:negative Integument/breast: negative Hematologic/lymphatic: negative Musculoskeletal:negative Neurological: negative Behavioral/Psych: negative Endocrine:  negative Allergic/Immunologic: negative   PHYSICAL EXAMINATION: General appearance: alert, cooperative and no distress Head: Normocephalic, without obvious abnormality, atraumatic Neck: no adenopathy, no JVD, supple, symmetrical, trachea midline and thyroid not enlarged, symmetric, no tenderness/mass/nodules Lymph nodes: Cervical, supraclavicular, and axillary nodes normal. Resp: clear to auscultation bilaterally Back: symmetric, no curvature. ROM normal. No CVA tenderness. Cardio: regular rate and rhythm, S1, S2 normal, no murmur, click, rub or gallop GI: soft, non-tender; bowel sounds normal; no masses,  no organomegaly Extremities: extremities normal, atraumatic, no cyanosis or edema Neurologic: Alert and oriented X 3, normal strength and tone. Normal symmetric reflexes. Normal coordination and gait  ECOG PERFORMANCE STATUS: 0 - Asymptomatic  Blood pressure (!) 113/57, pulse 99, temperature (!) 96.2 F (35.7 C), temperature source Temporal, resp. rate 17, height 4' 11"  (1.499 m), weight 90 lb 9.6 oz (41.1 kg), SpO2 99 %.  LABORATORY DATA: Lab Results  Component Value Date   WBC 6.2 08/15/2019   HGB 14.8 08/15/2019   HCT 44.6 08/15/2019   MCV 92.3 08/15/2019   PLT 257 08/15/2019      Chemistry      Component Value Date/Time   NA 138 08/15/2019 0935   NA 139 06/16/2017 1132   K 5.1 08/15/2019 0935   K 4.7 06/16/2017 1132   CL 100 08/15/2019  0935   CO2 27 08/15/2019 0935   CO2 28 06/16/2017 1132   BUN 28 (H) 08/15/2019 0935   BUN 19.5 06/16/2017 1132   CREATININE 0.99 08/15/2019 0935   CREATININE 0.8 06/16/2017 1132      Component Value Date/Time   CALCIUM 9.8 08/15/2019 0935   CALCIUM 10.0 06/16/2017 1132   ALKPHOS 84 08/15/2019 0935   ALKPHOS 68 06/16/2017 1132   AST 27 08/15/2019 0935   AST 21 06/16/2017 1132   ALT 23 08/15/2019 0935   ALT 18 06/16/2017 1132   BILITOT 0.3 08/15/2019 0935   BILITOT 0.50 06/16/2017 1132       RADIOGRAPHIC STUDIES: CT Chest  W Contrast  Result Date: 08/15/2019 CLINICAL DATA:  Right upper lobe lung cancer status post lobectomy in 2017 with adjuvant chemotherapy and radiation therapy. Ongoing oral chemotherapy. Restaging. EXAM: CT CHEST, ABDOMEN, AND PELVIS WITH CONTRAST TECHNIQUE: Multidetector CT imaging of the chest, abdomen and pelvis was performed following the standard protocol during bolus administration of intravenous contrast. CONTRAST:  21m OMNIPAQUE IOHEXOL 300 MG/ML  SOLN COMPARISON:  04/22/2016 chest CT. 05/29/2018 CT chest, abdomen and pelvis. FINDINGS: CT CHEST FINDINGS Cardiovascular: Normal heart size. Stable trace pericardial effusion/thickening. Left anterior descending coronary atherosclerosis. Great vessels are normal in course and caliber. No central pulmonary emboli. Mediastinum/Nodes: No discrete thyroid nodules. Unremarkable esophagus. No pathologically enlarged axillary, mediastinal or hilar lymph nodes. Surgical clips again noted in right paratracheal, right hilar and right subcarinal region. Lungs/Pleura: No pneumothorax. No pleural effusion. Status post right upper lobectomy. No acute consolidative airspace disease or lung masses. Numerous small solid pulmonary nodules scattered throughout the right greater than left lungs, largest 5 mm in the posterior right lower lobe (series 5/image 77), previously 5 mm using similar measurement technique, all stable. No new significant pulmonary nodules. Musculoskeletal: No aggressive appearing focal osseous lesions. Left shoulder arthroplasty is partially visualized. Mild thoracic spondylosis. CT ABDOMEN PELVIS FINDINGS Hepatobiliary: Normal liver with no liver mass. Normal gallbladder with no radiopaque cholelithiasis. No biliary ductal dilatation. Pancreas: Normal, with no mass or duct dilation. Spleen: Normal size. No mass. Adrenals/Urinary Tract: Normal adrenals. Normal kidneys with no hydronephrosis and no renal mass. Normal bladder. Stomach/Bowel: Normal  non-distended stomach. Normal caliber small bowel with no small bowel wall thickening. Appendix not discretely visualized. Oral contrast transits to the colon. Large colonic stool volume. No large bowel wall thickening, diverticulosis or significant pericolonic fat stranding. Vascular/Lymphatic: Atherosclerotic nonaneurysmal abdominal aorta. Patent portal, splenic, hepatic and renal veins. No pathologically enlarged lymph nodes in the abdomen or pelvis. Reproductive: Grossly normal uterus.  No adnexal mass. Other: No pneumoperitoneum, ascites or focal fluid collection. Musculoskeletal: No aggressive appearing focal osseous lesions. Mild lumbar spondylosis. IMPRESSION: 1. Stable CT study. Numerous tiny bilateral pulmonary nodules are stable. No new or progressive metastatic disease in the chest. No evidence of metastatic disease in the abdomen or pelvis. 2. Large colonic stool volume, suggesting constipation. 3. Chronic findings include: Aortic Atherosclerosis (ICD10-I70.0). One vessel coronary atherosclerosis. Electronically Signed   By: JIlona SorrelM.D.   On: 08/15/2019 13:49   CT Abdomen Pelvis W Contrast  Result Date: 08/15/2019 CLINICAL DATA:  Right upper lobe lung cancer status post lobectomy in 2017 with adjuvant chemotherapy and radiation therapy. Ongoing oral chemotherapy. Restaging. EXAM: CT CHEST, ABDOMEN, AND PELVIS WITH CONTRAST TECHNIQUE: Multidetector CT imaging of the chest, abdomen and pelvis was performed following the standard protocol during bolus administration of intravenous contrast. CONTRAST:  737mOMNIPAQUE IOHEXOL 300 MG/ML  SOLN COMPARISON:  04/22/2016 chest CT. 05/29/2018 CT chest, abdomen and pelvis. FINDINGS: CT CHEST FINDINGS Cardiovascular: Normal heart size. Stable trace pericardial effusion/thickening. Left anterior descending coronary atherosclerosis. Great vessels are normal in course and caliber. No central pulmonary emboli. Mediastinum/Nodes: No discrete thyroid nodules.  Unremarkable esophagus. No pathologically enlarged axillary, mediastinal or hilar lymph nodes. Surgical clips again noted in right paratracheal, right hilar and right subcarinal region. Lungs/Pleura: No pneumothorax. No pleural effusion. Status post right upper lobectomy. No acute consolidative airspace disease or lung masses. Numerous small solid pulmonary nodules scattered throughout the right greater than left lungs, largest 5 mm in the posterior right lower lobe (series 5/image 77), previously 5 mm using similar measurement technique, all stable. No new significant pulmonary nodules. Musculoskeletal: No aggressive appearing focal osseous lesions. Left shoulder arthroplasty is partially visualized. Mild thoracic spondylosis. CT ABDOMEN PELVIS FINDINGS Hepatobiliary: Normal liver with no liver mass. Normal gallbladder with no radiopaque cholelithiasis. No biliary ductal dilatation. Pancreas: Normal, with no mass or duct dilation. Spleen: Normal size. No mass. Adrenals/Urinary Tract: Normal adrenals. Normal kidneys with no hydronephrosis and no renal mass. Normal bladder. Stomach/Bowel: Normal non-distended stomach. Normal caliber small bowel with no small bowel wall thickening. Appendix not discretely visualized. Oral contrast transits to the colon. Large colonic stool volume. No large bowel wall thickening, diverticulosis or significant pericolonic fat stranding. Vascular/Lymphatic: Atherosclerotic nonaneurysmal abdominal aorta. Patent portal, splenic, hepatic and renal veins. No pathologically enlarged lymph nodes in the abdomen or pelvis. Reproductive: Grossly normal uterus.  No adnexal mass. Other: No pneumoperitoneum, ascites or focal fluid collection. Musculoskeletal: No aggressive appearing focal osseous lesions. Mild lumbar spondylosis. IMPRESSION: 1. Stable CT study. Numerous tiny bilateral pulmonary nodules are stable. No new or progressive metastatic disease in the chest. No evidence of metastatic  disease in the abdomen or pelvis. 2. Large colonic stool volume, suggesting constipation. 3. Chronic findings include: Aortic Atherosclerosis (ICD10-I70.0). One vessel coronary atherosclerosis. Electronically Signed   By: Ilona Sorrel M.D.   On: 08/15/2019 13:49    ASSESSMENT AND PLAN:  This is a very pleasant 69 years old white female with stage IIIA non-small cell lung cancer, adenocarcinoma with positive EGFR mutation in exon 19 status post right upper lobectomy with lymph node dissection followed by adjuvant systemic chemotherapy as well as adjuvant radiation. The patient has been on observation for almost 3 years now.  She is feeling fine with no concerning complaints.   Unfortunately her scan in June 2020 showed multiple tiny pulmonary nodules scattered throughout both lungs which many of them have increased in size and some new nodule suspected the previous scan. The patient started treatment with Tagrisso 80 mg p.o. daily status post 9 months of treatment.   She has been tolerating this treatment well with no concerning adverse effects. She had repeat CT scan of the chest, abdomen pelvis performed recently.  I personally and independently reviewed the scans and discussed the results with the patient and her husband. Her scan showed no concerning findings for disease progression. I recommended for the patient to continue her current treatment with Tagrisso with the same dose. I will see her back for follow-up visit in 6 weeks for evaluation with repeat blood work. For the coronary artery disease, she is followed by cardiology. She was advised to call immediately if she has any concerning symptoms in the interval. The patient voices understanding of current disease status and treatment options and is in agreement with the current care plan. All questions were answered.  The patient knows to call the clinic with any problems, questions or concerns. We can certainly see the patient much sooner if  necessary.  Disclaimer: This note was dictated with voice recognition software. Similar sounding words can inadvertently be transcribed and may not be corrected upon review.

## 2019-09-05 DIAGNOSIS — H2513 Age-related nuclear cataract, bilateral: Secondary | ICD-10-CM | POA: Diagnosis not present

## 2019-09-05 DIAGNOSIS — H524 Presbyopia: Secondary | ICD-10-CM | POA: Diagnosis not present

## 2019-09-05 DIAGNOSIS — H25013 Cortical age-related cataract, bilateral: Secondary | ICD-10-CM | POA: Diagnosis not present

## 2019-09-11 DIAGNOSIS — Z471 Aftercare following joint replacement surgery: Secondary | ICD-10-CM | POA: Diagnosis not present

## 2019-09-11 DIAGNOSIS — Z96612 Presence of left artificial shoulder joint: Secondary | ICD-10-CM | POA: Diagnosis not present

## 2019-09-23 MED FILL — TAGRISSO 80 MG TABLET: 80 | 30 days supply | Qty: 30 | Fill #1

## 2019-10-01 ENCOUNTER — Inpatient Hospital Stay: Payer: Medicare Other

## 2019-10-01 ENCOUNTER — Inpatient Hospital Stay: Payer: Medicare Other | Attending: Internal Medicine | Admitting: Internal Medicine

## 2019-10-01 ENCOUNTER — Other Ambulatory Visit: Payer: Medicare Other

## 2019-10-01 ENCOUNTER — Encounter: Payer: Self-pay | Admitting: *Deleted

## 2019-10-01 ENCOUNTER — Encounter: Payer: Self-pay | Admitting: Internal Medicine

## 2019-10-01 ENCOUNTER — Telehealth: Payer: Self-pay | Admitting: Internal Medicine

## 2019-10-01 ENCOUNTER — Other Ambulatory Visit: Payer: Self-pay

## 2019-10-01 VITALS — BP 103/68 | HR 97 | Temp 98.2°F | Resp 17 | Ht 59.0 in | Wt 91.0 lb

## 2019-10-01 DIAGNOSIS — C3491 Malignant neoplasm of unspecified part of right bronchus or lung: Secondary | ICD-10-CM | POA: Diagnosis not present

## 2019-10-01 DIAGNOSIS — C3411 Malignant neoplasm of upper lobe, right bronchus or lung: Secondary | ICD-10-CM | POA: Diagnosis not present

## 2019-10-01 DIAGNOSIS — I2584 Coronary atherosclerosis due to calcified coronary lesion: Secondary | ICD-10-CM | POA: Diagnosis not present

## 2019-10-01 DIAGNOSIS — I2510 Atherosclerotic heart disease of native coronary artery without angina pectoris: Secondary | ICD-10-CM

## 2019-10-01 DIAGNOSIS — C3490 Malignant neoplasm of unspecified part of unspecified bronchus or lung: Secondary | ICD-10-CM | POA: Diagnosis not present

## 2019-10-01 DIAGNOSIS — Z5111 Encounter for antineoplastic chemotherapy: Secondary | ICD-10-CM | POA: Diagnosis not present

## 2019-10-01 LAB — CMP (CANCER CENTER ONLY)
ALT: 20 U/L (ref 0–44)
AST: 21 U/L (ref 15–41)
Albumin: 3.6 g/dL (ref 3.5–5.0)
Alkaline Phosphatase: 75 U/L (ref 38–126)
Anion gap: 10 (ref 5–15)
BUN: 24 mg/dL — ABNORMAL HIGH (ref 8–23)
CO2: 28 mmol/L (ref 22–32)
Calcium: 9.3 mg/dL (ref 8.9–10.3)
Chloride: 102 mmol/L (ref 98–111)
Creatinine: 0.93 mg/dL (ref 0.44–1.00)
GFR, Est AFR Am: >60 mL/min (ref >60)
GFR, Estimated: >60 mL/min (ref >60)
Glucose, Bld: 80 mg/dL (ref 70–99)
Potassium: 4.1 mmol/L (ref 3.5–5.1)
Sodium: 140 mmol/L (ref 135–145)
Total Bilirubin: 0.3 mg/dL (ref 0.3–1.2)
Total Protein: 6.4 g/dL — ABNORMAL LOW (ref 6.5–8.1)

## 2019-10-01 LAB — CBC WITH DIFFERENTIAL (CANCER CENTER ONLY)
Abs Immature Granulocytes: 0.02 10*3/uL (ref 0.00–0.07)
Basophils Absolute: 0 10*3/uL (ref 0.0–0.1)
Basophils Relative: 0 %
Eosinophils Absolute: 0.1 10*3/uL (ref 0.0–0.5)
Eosinophils Relative: 1 %
HCT: 41.3 % (ref 36.0–46.0)
Hemoglobin: 13.4 g/dL (ref 12.0–15.0)
Immature Granulocytes: 0 %
Lymphocytes Relative: 17 %
Lymphs Abs: 1 10*3/uL (ref 0.7–4.0)
MCH: 29.8 pg (ref 26.0–34.0)
MCHC: 32.4 g/dL (ref 30.0–36.0)
MCV: 91.8 fL (ref 80.0–100.0)
Monocytes Absolute: 0.8 10*3/uL (ref 0.1–1.0)
Monocytes Relative: 14 %
Neutro Abs: 3.8 10*3/uL (ref 1.7–7.7)
Neutrophils Relative %: 68 %
Platelet Count: 220 10*3/uL (ref 150–400)
RBC: 4.5 MIL/uL (ref 3.87–5.11)
RDW: 13.1 % (ref 11.5–15.5)
WBC Count: 5.7 10*3/uL (ref 4.0–10.5)
nRBC: 0 % (ref 0.0–0.2)

## 2019-10-01 NOTE — Progress Notes (Signed)
Oncology Nurse Navigator Documentation  Oncology Nurse Navigator Flowsheets 10/01/2019  Abnormal Finding Date -  Confirmed Diagnosis Date -  Expected Surgery Date -  Navigator Location [facility]  Referral Date to RadOnc/MedOnc -  Navigator Encounter Type Clinic/MDC/I spoke with patient today in clinic.  She is doing well without complaints.  Offered support and encouragement.   Telephone -  Treatment Initiated Date -  Patient Visit Type -  Treatment Phase Follow-up  Barriers/Navigation Needs Education  Education Other  Interventions Psycho-Social Support  Acuity Level 2-Minimal Needs (1-2 Barriers Identified)  Coordination of Care -  Education Method Verbal  Time Spent with Patient 15

## 2019-10-01 NOTE — Telephone Encounter (Signed)
Scheduled per los. Patient declined printout  

## 2019-10-01 NOTE — Progress Notes (Signed)
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  PRIOR THERAPY: 1) Right VATS with right upper lobectomy with bronchoplasty closure and en bloc resection of a wedge from the superior segment of the right lower lobe in addition to mediastinal lymph node dissection on 07/30/2015 under the care of Dr. Roxan Hockey. 2) Adjuvant systemic chemotherapy with cisplatin 75 MG/M2 and Alimta 500 MG/M2 every 3 weeks. First dose was given on 09/10/2015. Status post 4 cycles. 3) adjuvant radiotherapy under the care of Dr. Tammi Klippel.  CURRENT THERAPY: Tagrisso 80 mg p.o. daily started 12/01/2018.  Status post 10 months of treatment.  INTERVAL HISTORY: [name] West 69 y.o. female returns to the clinic today for follow-up visit.  The patient is feeling fine today with no concerning complaints.  She has been tolerating her treatment with Tagrisso fairly well except for mild nail changes.  She denied having any current chest pain, shortness of breath, cough or hemoptysis.  She denied having any fever or chills.  She has no nausea, vomiting, diarrhea or constipation.  She denied having any headache or visual changes.  She is here today for evaluation and repeat blood work.  MEDICAL HISTORY: Past Medical History:  Diagnosis Date  . Anxiety   . Arthritis    shoulders  . Cancer ([place]) 06/2014   Lung Cancer  . Complication of anesthesia    nausea, does well with PROPOFOL   . Cough 09/17/2015  . Encounter for antineoplastic chemotherapy 10/22/2015  . Environmental and seasonal allergies   .  Family history of adverse reaction to anesthesia    Patients grandmother died while having hand surgery; pt unsure of cause  . Oral thrush 10/22/2015  . Ovarian cyst   . PONV (postoperative nausea and vomiting)   . Raynaud disease     ALLERGIES:  is allergic to avelox [moxifloxacin hcl in nacl]; cephalosporins; clindamycin/lincomycin; penicillins; macrobid [nitrofurantoin monohyd macro]; omnipaque [iohexol]; ciprofloxacin; adhesive [tape]; doxycycline; lidoderm [lidocaine]; pseudoephedrine; and sulfa antibiotics.  MEDICATIONS:  Current Outpatient Medications  Medication Sig Dispense Refill  . Azelaic Acid (FINACEA) 15 % cream Apply 1 application topically daily. After skin is thoroughly washed and patted dry, gently but thoroughly massage a thin film of azelaic acid cream into the affected area twice daily, in the morning and evening.     . Azelastine HCl 0.15 % SOLN Inhale 1 spray into the lungs daily.    . cholecalciferol (VITAMIN D) 1000 UNITS tablet Take 1,000 Units by mouth daily.    . citalopram (CELEXA) 10 MG tablet Take 10 mg by mouth daily.    . Clobetasol Prop Oint-Coal Tar (CLOBETAPLUS OINTMENT EX) Apply 1 Dose topically daily.    Marland Kitchen ibuprofen (ADVIL,MOTRIN) 200 MG tablet Take 200 mg by mouth every 6 (six) hours as needed (pain).    . LORazepam (ATIVAN) 0.5 MG tablet Take 0.5 mg by mouth at bedtime as needed for anxiety or sleep.     . Multiple Vitamin (MULTIVITAMIN WITH MINERALS) TABS Take 1 tablet by mouth daily.    Marland Kitchen neomycin-polymyxin-hydrocortisone (CORTISPORIN) 3.5-10000-1 OTIC suspension Place 4 drops into both ears 4 (four) times  daily.    . NONFORMULARY OR COMPOUNDED ITEM Place 1 application vaginally 2 (two) times a week. Estradiol 0.02% Cream    . predniSONE (DELTASONE) 50 MG tablet Take one tablet 13 hours , one tablet 7 hours and 1 tablet 1 hours prior to CT scan for CT dye allergy. 3 tablet 3  . Probiotic Product (ALIGN) 4 MG CAPS Take 1 capsule by mouth daily.    Marland Kitchen  TAGRISSO 80 MG tablet TAKE 1 TABLET (80 MG TOTAL) BY MOUTH DAILY. 30 tablet 2   No current facility-administered medications for this visit.    SURGICAL HISTORY:  Past Surgical History:  Procedure Laterality Date  . BREAST EXCISIONAL BIOPSY Left    1996  . CESAREAN SECTION  1984  . COLONOSCOPY WITH PROPOFOL N/A 09/27/2012   Procedure: COLONOSCOPY WITH PROPOFOL;  Surgeon: Garlan Fair, MD;  Location: [facility];  Service: Endoscopy;  Laterality: N/A;  . Fibroid Removed    . LAPAROTOMY Left 07/20/2016   Procedure: MINI LAPAROTOMY, REMOVAL OF LEFT OVARIAN CYST, LEFT SALPINGO-OOPHORECTOMY;  Surgeon: Dian Queen, MD;  Location: [facility];  Service: Gynecology;  Laterality: Left;  please moved to 7:30am if spot opens  . Right Rotator Cuff Right   . TOTAL SHOULDER ARTHROPLASTY Left 06/14/2018   Procedure: TOTAL SHOULDER ARTHROPLASTY;  Surgeon: Justice Britain, MD;  Location: [facility];  Service: Orthopedics;  Laterality: Left;  141mn  . VIDEO ASSISTED THORACOSCOPY (VATS)/ LOBECTOMY Right 07/30/2015   Procedure: RIGHT VIDEO ASSISTED THORACOSCOPY (VATS)/RIGHT UPPER LOBECTOMY;  Surgeon: SMelrose Nakayama MD;  [facility]  Service: Thoracic;  Laterality: Right;  .Marland KitchenVIDEO BRONCHOSCOPY WITH ENDOBRONCHIAL NAVIGATION N/A 07/15/2015   Procedure: VIDEO BRONCHOSCOPY WITH ENDOBRONCHIAL NAVIGATION with Biopsy;  Surgeon: RCollene Gobble MD;  Location: [facility];  Service: Thoracic;  Laterality: N/A;    REVIEW OF SYSTEMS:  A comprehensive review of systems was negative except for: Integument/breast: positive for dryness   PHYSICAL EXAMINATION: General appearance: alert, cooperative and no distress Head: Normocephalic, without obvious abnormality, atraumatic Neck: no adenopathy, no JVD, supple, symmetrical, trachea midline and thyroid not enlarged, symmetric, no tenderness/mass/nodules Lymph nodes: Cervical, supraclavicular, and axillary nodes normal. Resp: clear to auscultation bilaterally Back: symmetric,  no curvature. ROM normal. No CVA tenderness. Cardio: regular rate and rhythm, S1, S2 normal, no murmur, click, rub or gallop GI: soft, non-tender; bowel sounds normal; no masses,  no organomegaly Extremities: extremities normal, atraumatic, no cyanosis or edema  ECOG PERFORMANCE STATUS: 0 - Asymptomatic  Blood pressure 103/68, pulse 97, temperature 98.2 F (36.8 C), temperature source Temporal, resp. rate 17, height 4' 11"  (1.499 m), weight 91 lb (41.3 kg), SpO2 100 %.  LABORATORY DATA: Lab Results  Component Value Date   WBC 5.7 10/01/2019   HGB 13.4 10/01/2019   HCT 41.3 10/01/2019   MCV 91.8 10/01/2019   PLT 220 10/01/2019      Chemistry      Component Value Date/Time   NA 138 08/15/2019 0935   NA 139 06/16/2017 1132   K 5.1 08/15/2019 0935   K 4.7 06/16/2017 1132   CL 100 08/15/2019 0935   CO2 27 08/15/2019 0935   CO2 28 06/16/2017 1132   BUN 28 (H) 08/15/2019 0935   BUN 19.5 06/16/2017 1132   CREATININE 0.99 08/15/2019 0935   CREATININE 0.8 06/16/2017 1132      Component Value Date/Time   CALCIUM 9.8 08/15/2019 0935   CALCIUM 10.0 06/16/2017 1132   ALKPHOS 84 08/15/2019 0935  ALKPHOS 68 06/16/2017 1132   AST 27 08/15/2019 0935   AST 21 06/16/2017 1132   ALT 23 08/15/2019 0935   ALT 18 06/16/2017 1132   BILITOT 0.3 08/15/2019 0935   BILITOT 0.50 06/16/2017 1132       RADIOGRAPHIC STUDIES: No results found.  ASSESSMENT AND PLAN:  This is a very pleasant 69 years old white female with stage IIIA non-small cell lung cancer, adenocarcinoma with positive EGFR mutation in exon 19 status post right upper lobectomy with lymph node dissection followed by adjuvant systemic chemotherapy as well as adjuvant radiation. The patient has been on observation for almost 3 years now.  She is feeling fine with no concerning complaints.   Unfortunately her scan in June 2020 showed multiple tiny pulmonary nodules scattered throughout both lungs which many of them have increased  in size and some new nodule suspected the previous scan. The patient started treatment with Tagrisso 80 mg p.o. daily status post 10 months of treatment.   The patient has been tolerating this treatment well with no concerning adverse effects except for mild skin rash. I recommended for the patient to continue her current treatment with Tagrisso with the same dose. I will see the patient back for follow-up visit in 6 weeks for evaluation with repeat CT scan of the chest for restaging of her disease. She was advised to call immediately if she has any concerning symptoms in the interval. The patient voices understanding of current disease status and treatment options and is in agreement with the current care plan. All questions were answered. The patient knows to call the clinic with any problems, questions or concerns. We can certainly see the patient much sooner if necessary.  Disclaimer: This note was dictated with voice recognition software. Similar sounding words can inadvertently be transcribed and may not be corrected upon review.

## 2019-10-02 ENCOUNTER — Encounter: Payer: Self-pay | Admitting: Internal Medicine

## 2019-10-15 DIAGNOSIS — R112 Nausea with vomiting, unspecified: Secondary | ICD-10-CM | POA: Diagnosis not present

## 2019-10-15 DIAGNOSIS — M25551 Pain in right hip: Secondary | ICD-10-CM | POA: Diagnosis not present

## 2019-10-15 DIAGNOSIS — M79674 Pain in right toe(s): Secondary | ICD-10-CM | POA: Diagnosis not present

## 2019-11-12 ENCOUNTER — Inpatient Hospital Stay: Payer: Medicare Other | Attending: Internal Medicine

## 2019-11-12 ENCOUNTER — Ambulatory Visit (HOSPITAL_COMMUNITY)
Admission: RE | Admit: 2019-11-12 | Discharge: 2019-11-12 | Disposition: A | Discharge status: Home / Self Care | Payer: Medicare Other | Source: Ambulatory Visit | Attending: Internal Medicine | Admitting: Internal Medicine

## 2019-11-12 ENCOUNTER — Other Ambulatory Visit: Payer: Self-pay

## 2019-11-12 ENCOUNTER — Encounter (HOSPITAL_COMMUNITY): Payer: Self-pay

## 2019-11-12 DIAGNOSIS — R05 Cough: Secondary | ICD-10-CM | POA: Diagnosis not present

## 2019-11-12 DIAGNOSIS — C3411 Malignant neoplasm of upper lobe, right bronchus or lung: Secondary | ICD-10-CM | POA: Diagnosis not present

## 2019-11-12 DIAGNOSIS — R918 Other nonspecific abnormal finding of lung field: Secondary | ICD-10-CM | POA: Diagnosis not present

## 2019-11-12 DIAGNOSIS — C3490 Malignant neoplasm of unspecified part of unspecified bronchus or lung: Secondary | ICD-10-CM | POA: Insufficient documentation

## 2019-11-12 DIAGNOSIS — Z923 Personal history of irradiation: Secondary | ICD-10-CM | POA: Insufficient documentation

## 2019-11-12 DIAGNOSIS — Z902 Acquired absence of lung [part of]: Secondary | ICD-10-CM | POA: Insufficient documentation

## 2019-11-12 DIAGNOSIS — R911 Solitary pulmonary nodule: Secondary | ICD-10-CM | POA: Insufficient documentation

## 2019-11-12 DIAGNOSIS — J181 Lobar pneumonia, unspecified organism: Secondary | ICD-10-CM | POA: Diagnosis not present

## 2019-11-12 LAB — CBC WITH DIFFERENTIAL (CANCER CENTER ONLY)
Abs Immature Granulocytes: 0.03 10*3/uL (ref 0.00–0.07)
Basophils Absolute: 0 10*3/uL (ref 0.0–0.1)
Basophils Relative: 0 %
Eosinophils Absolute: 0 10*3/uL (ref 0.0–0.5)
Eosinophils Relative: 0 %
HCT: 41.1 % (ref 36.0–46.0)
Hemoglobin: 13.5 g/dL (ref 12.0–15.0)
Immature Granulocytes: 0 %
Lymphocytes Relative: 7 %
Lymphs Abs: 0.6 10*3/uL — ABNORMAL LOW (ref 0.7–4.0)
MCH: 29.5 pg (ref 26.0–34.0)
MCHC: 32.8 g/dL (ref 30.0–36.0)
MCV: 89.9 fL (ref 80.0–100.0)
Monocytes Absolute: 0.2 10*3/uL (ref 0.1–1.0)
Monocytes Relative: 2 %
Neutro Abs: 7.6 10*3/uL (ref 1.7–7.7)
Neutrophils Relative %: 91 %
Platelet Count: 216 10*3/uL (ref 150–400)
RBC: 4.57 MIL/uL (ref 3.87–5.11)
RDW: 12.9 % (ref 11.5–15.5)
WBC Count: 8.4 10*3/uL (ref 4.0–10.5)
nRBC: 0 % (ref 0.0–0.2)

## 2019-11-12 LAB — CMP (CANCER CENTER ONLY)
ALT: 21 U/L (ref 0–44)
AST: 23 U/L (ref 15–41)
Albumin: 3.7 g/dL (ref 3.5–5.0)
Alkaline Phosphatase: 76 U/L (ref 38–126)
Anion gap: 13 (ref 5–15)
BUN: 23 mg/dL (ref 8–23)
CO2: 23 mmol/L (ref 22–32)
Calcium: 9.3 mg/dL (ref 8.9–10.3)
Chloride: 103 mmol/L (ref 98–111)
Creatinine: 0.87 mg/dL (ref 0.44–1.00)
GFR, Est AFR Am: >60 mL/min (ref >60)
GFR, Estimated: >60 mL/min (ref >60)
Glucose, Bld: 144 mg/dL — ABNORMAL HIGH (ref 70–99)
Potassium: 4.4 mmol/L (ref 3.5–5.1)
Sodium: 139 mmol/L (ref 135–145)
Total Bilirubin: 0.3 mg/dL (ref 0.3–1.2)
Total Protein: 6.6 g/dL (ref 6.5–8.1)

## 2019-11-12 MED ORDER — IOHEXOL 300 MG/ML  SOLN
75.0000 mL | Freq: Once | INTRAMUSCULAR | Status: AC | PRN
  Administered 2019-11-12: 75 mL via INTRAVENOUS

## 2019-11-12 MED ORDER — SODIUM CHLORIDE (PF) 0.9 % IJ SOLN
INTRAMUSCULAR | Status: AC
  Filled 2019-11-12: qty 50

## 2019-11-13 ENCOUNTER — Inpatient Hospital Stay (HOSPITAL_BASED_OUTPATIENT_CLINIC_OR_DEPARTMENT_OTHER): Payer: Medicare Other | Admitting: Internal Medicine

## 2019-11-13 ENCOUNTER — Telehealth: Payer: Self-pay | Admitting: Internal Medicine

## 2019-11-13 ENCOUNTER — Encounter: Payer: Self-pay | Admitting: Internal Medicine

## 2019-11-13 ENCOUNTER — Other Ambulatory Visit: Payer: Self-pay

## 2019-11-13 VITALS — BP 108/55 | HR 85 | Temp 97.5°F | Resp 18 | Ht 59.0 in | Wt 92.5 lb

## 2019-11-13 DIAGNOSIS — C3491 Malignant neoplasm of unspecified part of right bronchus or lung: Secondary | ICD-10-CM

## 2019-11-13 DIAGNOSIS — I2510 Atherosclerotic heart disease of native coronary artery without angina pectoris: Secondary | ICD-10-CM | POA: Diagnosis not present

## 2019-11-13 DIAGNOSIS — Z5111 Encounter for antineoplastic chemotherapy: Secondary | ICD-10-CM | POA: Diagnosis not present

## 2019-11-13 DIAGNOSIS — R05 Cough: Secondary | ICD-10-CM

## 2019-11-13 DIAGNOSIS — C3411 Malignant neoplasm of upper lobe, right bronchus or lung: Secondary | ICD-10-CM | POA: Diagnosis not present

## 2019-11-13 DIAGNOSIS — I2584 Coronary atherosclerosis due to calcified coronary lesion: Secondary | ICD-10-CM | POA: Diagnosis not present

## 2019-11-13 DIAGNOSIS — R053 Chronic cough: Secondary | ICD-10-CM

## 2019-11-13 NOTE — Telephone Encounter (Signed)
Scheduled per 6/2 los. Pt aware of appts. No AVS or calendar needed to be printed.

## 2019-11-13 NOTE — Progress Notes (Signed)
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  PRIOR THERAPY: 1) Right VATS with right upper lobectomy with bronchoplasty closure and en bloc resection of a wedge from the superior segment of the right lower lobe in addition to mediastinal lymph node dissection on 07/30/2015 under the care of Dr. Roxan Hockey. 2) Adjuvant systemic chemotherapy with cisplatin 75 MG/M2 and Alimta 500 MG/M2 every 3 weeks. First dose was given on 09/10/2015. Status post 4 cycles. 3) adjuvant radiotherapy under the care of Dr. Tammi Klippel.  CURRENT THERAPY: Tagrisso 80 mg p.o. daily started 12/01/2018.  Status post 11 months of treatment.  INTERVAL HISTORY: [name] West 69 y.o. female returns to the clinic today for follow-up visit.  The patient is feeling fine today with no concerning complaints except for occasional cough in the evening after drinking ice water.  She denied having any chest pain, shortness of breath, cough or hemoptysis.  She denied having any fever or chills.  She has no nausea, vomiting, diarrhea or constipation.  She denied having any headache or visual changes.  She has been tolerating her treatment with Tagrisso fairly well.  The patient is here today for evaluation with repeat CT scan of the chest for restaging of her disease.  MEDICAL HISTORY: Past Medical History:  Diagnosis Date  . Anxiety   . Arthritis    shoulders  . Cancer ([place]) 06/2014   Lung Cancer  . Complication of anesthesia    nausea, does well with PROPOFOL   . Cough 09/17/2015  . Encounter for  antineoplastic chemotherapy 10/22/2015  . Environmental and seasonal allergies   . Family history of adverse reaction to anesthesia    Patients grandmother died while having hand surgery; pt unsure of cause  . Oral thrush 10/22/2015  . Ovarian cyst   . PONV (postoperative nausea and vomiting)   . Raynaud disease     ALLERGIES:  is allergic to avelox [moxifloxacin hcl in nacl]; cephalosporins; clindamycin/lincomycin; penicillins; macrobid [nitrofurantoin monohyd macro]; omnipaque [iohexol]; ciprofloxacin; adhesive [tape]; doxycycline; lidoderm [lidocaine]; pseudoephedrine; and sulfa antibiotics.  MEDICATIONS:  Current Outpatient Medications  Medication Sig Dispense Refill  . Azelaic Acid (FINACEA) 15 % cream Apply 1 application topically daily. After skin is thoroughly washed and patted dry, gently but thoroughly massage a thin film of azelaic acid cream into the affected area twice daily, in the morning and evening.     . Azelastine HCl 0.15 % SOLN Inhale 1 spray into the lungs daily.    . cholecalciferol (VITAMIN D) 1000 UNITS tablet Take 1,000 Units by mouth daily.    . citalopram (CELEXA) 10 MG tablet Take 10 mg by mouth daily.    . Clobetasol Prop Oint-Coal Tar (CLOBETAPLUS OINTMENT EX) Apply 1 Dose topically daily.    Marland Kitchen ibuprofen (ADVIL,MOTRIN) 200 MG tablet Take 200 mg by mouth every 6 (six) hours as needed (pain).    . LORazepam (ATIVAN) 0.5 MG tablet Take 0.5 mg by mouth at bedtime as needed for anxiety or sleep.     . Multiple Vitamin (MULTIVITAMIN WITH MINERALS) TABS Take 1 tablet by mouth daily.    Marland Kitchen  neomycin-polymyxin-hydrocortisone (CORTISPORIN) 3.5-10000-1 OTIC suspension Place 4 drops into both ears 4 (four) times daily.    . NONFORMULARY OR COMPOUNDED ITEM Place 1 application vaginally 2 (two) times a week. Estradiol 0.02% Cream    . predniSONE (DELTASONE) 50 MG tablet Take one tablet 13 hours , one tablet 7 hours and 1 tablet 1 hours prior to CT scan for CT dye allergy. 3  tablet 3  . Probiotic Product (ALIGN) 4 MG CAPS Take 1 capsule by mouth daily.    Marland Kitchen TAGRISSO 80 MG tablet TAKE 1 TABLET (80 MG TOTAL) BY MOUTH DAILY. 30 tablet 2   No current facility-administered medications for this visit.    SURGICAL HISTORY:  Past Surgical History:  Procedure Laterality Date  . BREAST EXCISIONAL BIOPSY Left    1996  . CESAREAN SECTION  1984  . COLONOSCOPY WITH PROPOFOL N/A 09/27/2012   Procedure: COLONOSCOPY WITH PROPOFOL;  Surgeon: Garlan Fair, MD;  [facility]: [facility];  Service: Endoscopy;  Laterality: N/A;  . Fibroid Removed    . LAPAROTOMY Left 07/20/2016   Procedure: MINI LAPAROTOMY, REMOVAL OF LEFT OVARIAN CYST, LEFT SALPINGO-OOPHORECTOMY;  Surgeon: Dian Queen, MD;  Location: [facility];  Service: Gynecology;  Laterality: Left;  please moved to 7:30am if spot opens  . Right Rotator Cuff Right   . TOTAL SHOULDER ARTHROPLASTY Left 06/14/2018   Procedure: TOTAL SHOULDER ARTHROPLASTY;  Surgeon: Justice Britain, MD;  Location: [facility];  Service: Orthopedics;  Laterality: Left;  19mn  . VIDEO ASSISTED THORACOSCOPY (VATS)/ LOBECTOMY Right 07/30/2015   Procedure: RIGHT VIDEO ASSISTED THORACOSCOPY (VATS)/RIGHT UPPER LOBECTOMY;  Surgeon: SMelrose Nakayama MD;  Location[facility]  Service: Thoracic;  Laterality: Right;  .Marland KitchenVIDEO BRONCHOSCOPY WITH ENDOBRONCHIAL NAVIGATION N/A 07/15/2015   Procedure: VIDEO BRONCHOSCOPY WITH ENDOBRONCHIAL NAVIGATION with Biopsy;  Surgeon: RCollene Gobble MD;  [facility]  Service: Thoracic;  Laterality: N/A;    REVIEW OF SYSTEMS:  Constitutional: negative Eyes: negative Ears, nose, mouth, throat, and face: negative Respiratory: positive for cough Cardiovascular: negative Gastrointestinal: negative Genitourinary:negative Integument/breast: negative Hematologic/lymphatic: negative Musculoskeletal:negative Neurological: negative Behavioral/Psych: negative Endocrine: negative Allergic/Immunologic: negative   PHYSICAL  EXAMINATION: General appearance: alert, cooperative and no distress Head: Normocephalic, without obvious abnormality, atraumatic Neck: no adenopathy, no JVD, supple, symmetrical, trachea midline and thyroid not enlarged, symmetric, no tenderness/mass/nodules Lymph nodes: Cervical, supraclavicular, and axillary nodes normal. Resp: clear to auscultation bilaterally Back: symmetric, no curvature. ROM normal. No CVA tenderness. Cardio: regular rate and rhythm, S1, S2 normal, no murmur, click, rub or gallop GI: soft, non-tender; bowel sounds normal; no masses,  no organomegaly Extremities: extremities normal, atraumatic, no cyanosis or edema Neurologic: Alert and oriented X 3, normal strength and tone. Normal symmetric reflexes. Normal coordination and gait  ECOG PERFORMANCE STATUS: 0 - Asymptomatic  Blood pressure (!) 108/55, pulse 85, temperature (!) 97.5 F (36.4 C), temperature source Temporal, resp. rate 18, height _0  (1.499 m), weight 92 lb 8 oz (42 kg), SpO2 100 %.  LABORATORY DATA: Lab Results  Component Value Date   WBC 8.4 11/12/2019   HGB 13.5 11/12/2019   HCT 41.1 11/12/2019   MCV 89.9 11/12/2019   PLT 216 11/12/2019      Chemistry      Component Value Date/Time   NA 139 11/12/2019 1038   NA 139 06/16/2017 1132   K 4.4 11/12/2019 1038   K 4.7 06/16/2017 1132   CL 103 11/12/2019 1038   CO2 23 11/12/2019 1038   CO2 28  06/16/2017 1132   BUN 23 11/12/2019 1038   BUN 19.5 06/16/2017 1132   CREATININE 0.87 11/12/2019 1038   CREATININE 0.8 06/16/2017 1132      Component Value Date/Time   CALCIUM 9.3 11/12/2019 1038   CALCIUM 10.0 06/16/2017 1132   ALKPHOS 76 11/12/2019 1038   ALKPHOS 68 06/16/2017 1132   AST 23 11/12/2019 1038   AST 21 06/16/2017 1132   ALT 21 11/12/2019 1038   ALT 18 06/16/2017 1132   BILITOT 0.3 11/12/2019 1038   BILITOT 0.50 06/16/2017 1132       RADIOGRAPHIC STUDIES: CT Chest W Contrast  Result Date: 11/12/2019 CLINICAL DATA:  Right  upper lobe non-small cell lung cancer status post lobectomy in 2017 with adjuvant chemotherapy and radiation therapy. Ongoing oral chemotherapy. Restaging. EXAM: CT CHEST WITH CONTRAST TECHNIQUE: Multidetector CT imaging of the chest was performed during intravenous contrast administration. CONTRAST:  67m OMNIPAQUE IOHEXOL 300 MG/ML  SOLN COMPARISON:  08/15/2019 CT chest, abdomen and pelvis. FINDINGS: Cardiovascular: Top-normal heart size. No significant pericardial effusion/thickening. Left anterior descending coronary atherosclerosis. Great vessels are normal in course and caliber. No central pulmonary emboli. Mediastinum/Nodes: No discrete thyroid nodules. Unremarkable esophagus. No pathologically enlarged axillary, mediastinal or hilar lymph nodes. Lungs/Pleura: No pneumothorax. No pleural effusion. Status post right upper lobectomy. Stable mild sharply marginated patchy consolidation in the upper right lung with associated volume loss and distortion, compatible with postradiation change. Several small solid pulmonary nodules scattered throughout the right greater than left lungs, largest 5 mm in the subpleural right lower lobe (series 7/image 67), all stable. No new significant pulmonary nodules. No acute airspace disease. Upper abdomen: No acute abnormality. Musculoskeletal: No aggressive appearing focal osseous lesions. Mild thoracic spondylosis. Partially visualized left shoulder arthroplasty. IMPRESSION: 1. No evidence of local tumor recurrence status post right upper lobectomy. Stable postradiation change in the upper right lung. 2. Scattered bilateral subcentimeter pulmonary nodules are all stable. No new or progressive metastatic disease in the chest. 3. Aortic Atherosclerosis (ICD10-I70.0). Electronically Signed   By: JIlona SorrelM.D.   On: 11/12/2019 14:40    ASSESSMENT AND PLAN:  This is a very pleasant 69years old white female with stage IIIA non-small cell lung cancer, adenocarcinoma with  positive EGFR mutation in exon 19 status post right upper lobectomy with lymph node dissection followed by adjuvant systemic chemotherapy as well as adjuvant radiation. The patient has been on observation for almost 3 years now.  She is feeling fine with no concerning complaints.   Unfortunately her scan in June 2020 showed multiple tiny pulmonary nodules scattered throughout both lungs which many of them have increased in size and some new nodule suspected the previous scan.  The patient started treatment with Tagrisso 80 mg p.o. daily status post 11 months of treatment.   The patient continues to tolerate her treatment well with no concerning complaints. She had repeat CT scan of the chest performed recently.  I personally and independently reviewed the scan and discussed the results with the patient today. Her scan showed no concerning findings for disease progression. I recommended for the patient to continue her current treatment with Tagrisso with the same dose. She will come back for follow-up visit in 2 months for evaluation and repeat blood work. She was advised to call immediately if she has any other concerning symptoms in the interval. The patient voices understanding of current disease status and treatment options and is in agreement with the current care plan. All questions were  answered. The patient knows to call the clinic with any problems, questions or concerns. We can certainly see the patient much sooner if necessary.  Disclaimer: This note was dictated with voice recognition software. Similar sounding words can inadvertently be transcribed and may not be corrected upon review.       

## 2019-11-18 ENCOUNTER — Other Ambulatory Visit: Payer: Self-pay | Admitting: Physician Assistant

## 2019-11-18 DIAGNOSIS — C3491 Malignant neoplasm of unspecified part of right bronchus or lung: Secondary | ICD-10-CM

## 2019-11-19 DIAGNOSIS — I781 Nevus, non-neoplastic: Secondary | ICD-10-CM | POA: Diagnosis not present

## 2019-11-19 DIAGNOSIS — L719 Rosacea, unspecified: Secondary | ICD-10-CM | POA: Diagnosis not present

## 2019-12-13 ENCOUNTER — Encounter: Payer: Self-pay | Admitting: Internal Medicine

## 2019-12-13 ENCOUNTER — Encounter: Payer: Self-pay | Admitting: Medical Oncology

## 2019-12-13 DIAGNOSIS — M25551 Pain in right hip: Secondary | ICD-10-CM | POA: Diagnosis not present

## 2019-12-13 DIAGNOSIS — M545 Low back pain: Secondary | ICD-10-CM | POA: Diagnosis not present

## 2019-12-18 DIAGNOSIS — M25551 Pain in right hip: Secondary | ICD-10-CM | POA: Diagnosis not present

## 2019-12-18 DIAGNOSIS — M545 Low back pain: Secondary | ICD-10-CM | POA: Diagnosis not present

## 2019-12-20 DIAGNOSIS — M25551 Pain in right hip: Secondary | ICD-10-CM | POA: Diagnosis not present

## 2019-12-20 DIAGNOSIS — M545 Low back pain: Secondary | ICD-10-CM | POA: Diagnosis not present

## 2019-12-23 MED FILL — TAGRISSO 80 MG TABLET: 80 | 30 days supply | Qty: 30 | Fill #1

## 2019-12-25 ENCOUNTER — Other Ambulatory Visit: Payer: Self-pay | Admitting: Family Medicine

## 2019-12-25 ENCOUNTER — Other Ambulatory Visit (HOSPITAL_COMMUNITY): Payer: Self-pay | Admitting: Family Medicine

## 2019-12-25 ENCOUNTER — Other Ambulatory Visit: Payer: Self-pay | Admitting: Pediatrics

## 2019-12-25 ENCOUNTER — Other Ambulatory Visit: Payer: Self-pay | Admitting: Student

## 2019-12-25 ENCOUNTER — Telehealth: Payer: Self-pay | Admitting: Internal Medicine

## 2019-12-25 DIAGNOSIS — Z1231 Encounter for screening mammogram for malignant neoplasm of breast: Secondary | ICD-10-CM

## 2019-12-25 DIAGNOSIS — I2510 Atherosclerotic heart disease of native coronary artery without angina pectoris: Secondary | ICD-10-CM

## 2019-12-25 DIAGNOSIS — I2584 Coronary atherosclerosis due to calcified coronary lesion: Secondary | ICD-10-CM

## 2019-12-25 DIAGNOSIS — M25551 Pain in right hip: Secondary | ICD-10-CM | POA: Diagnosis not present

## 2019-12-25 DIAGNOSIS — M545 Low back pain: Secondary | ICD-10-CM | POA: Diagnosis not present

## 2019-12-25 DIAGNOSIS — E782 Mixed hyperlipidemia: Secondary | ICD-10-CM

## 2019-12-25 NOTE — Telephone Encounter (Signed)
Patient calling stating she has lab work once a year. I did not see her lab orders and she is requesting a call when they are put in.

## 2019-12-25 NOTE — Telephone Encounter (Signed)
NMR ordered Patient aware

## 2019-12-27 DIAGNOSIS — M545 Low back pain: Secondary | ICD-10-CM | POA: Diagnosis not present

## 2019-12-27 DIAGNOSIS — M25551 Pain in right hip: Secondary | ICD-10-CM | POA: Diagnosis not present

## 2019-12-31 DIAGNOSIS — M25551 Pain in right hip: Secondary | ICD-10-CM | POA: Diagnosis not present

## 2019-12-31 DIAGNOSIS — M545 Low back pain: Secondary | ICD-10-CM | POA: Diagnosis not present

## 2020-01-02 DIAGNOSIS — I2510 Atherosclerotic heart disease of native coronary artery without angina pectoris: Secondary | ICD-10-CM | POA: Diagnosis not present

## 2020-01-02 DIAGNOSIS — M545 Low back pain: Secondary | ICD-10-CM | POA: Diagnosis not present

## 2020-01-02 DIAGNOSIS — C3491 Malignant neoplasm of unspecified part of right bronchus or lung: Secondary | ICD-10-CM | POA: Diagnosis not present

## 2020-01-02 DIAGNOSIS — M25551 Pain in right hip: Secondary | ICD-10-CM | POA: Diagnosis not present

## 2020-01-02 DIAGNOSIS — H16102 Unspecified superficial keratitis, left eye: Secondary | ICD-10-CM | POA: Diagnosis not present

## 2020-01-02 DIAGNOSIS — M8580 Other specified disorders of bone density and structure, unspecified site: Secondary | ICD-10-CM | POA: Diagnosis not present

## 2020-01-02 DIAGNOSIS — M810 Age-related osteoporosis without current pathological fracture: Secondary | ICD-10-CM | POA: Diagnosis not present

## 2020-01-07 DIAGNOSIS — M545 Low back pain: Secondary | ICD-10-CM | POA: Diagnosis not present

## 2020-01-07 DIAGNOSIS — M25551 Pain in right hip: Secondary | ICD-10-CM | POA: Diagnosis not present

## 2020-01-09 DIAGNOSIS — M25551 Pain in right hip: Secondary | ICD-10-CM | POA: Diagnosis not present

## 2020-01-09 DIAGNOSIS — M545 Low back pain: Secondary | ICD-10-CM | POA: Diagnosis not present

## 2020-01-09 DIAGNOSIS — H04123 Dry eye syndrome of bilateral lacrimal glands: Secondary | ICD-10-CM | POA: Diagnosis not present

## 2020-01-20 ENCOUNTER — Encounter: Payer: Self-pay | Admitting: Internal Medicine

## 2020-01-20 ENCOUNTER — Inpatient Hospital Stay: Payer: Medicare Other

## 2020-01-20 ENCOUNTER — Inpatient Hospital Stay: Payer: Medicare Other | Attending: Internal Medicine | Admitting: Internal Medicine

## 2020-01-20 ENCOUNTER — Telehealth: Payer: Self-pay | Admitting: Internal Medicine

## 2020-01-20 ENCOUNTER — Other Ambulatory Visit: Payer: Self-pay

## 2020-01-20 VITALS — BP 103/53 | HR 80 | Temp 98.1°F | Resp 17 | Ht 59.0 in | Wt 90.6 lb

## 2020-01-20 DIAGNOSIS — I2510 Atherosclerotic heart disease of native coronary artery without angina pectoris: Secondary | ICD-10-CM | POA: Diagnosis not present

## 2020-01-20 DIAGNOSIS — C3411 Malignant neoplasm of upper lobe, right bronchus or lung: Secondary | ICD-10-CM | POA: Diagnosis not present

## 2020-01-20 DIAGNOSIS — Z923 Personal history of irradiation: Secondary | ICD-10-CM | POA: Diagnosis not present

## 2020-01-20 DIAGNOSIS — R918 Other nonspecific abnormal finding of lung field: Secondary | ICD-10-CM | POA: Diagnosis not present

## 2020-01-20 DIAGNOSIS — I2584 Coronary atherosclerosis due to calcified coronary lesion: Secondary | ICD-10-CM | POA: Diagnosis not present

## 2020-01-20 DIAGNOSIS — Z5111 Encounter for antineoplastic chemotherapy: Secondary | ICD-10-CM | POA: Diagnosis not present

## 2020-01-20 DIAGNOSIS — Z9221 Personal history of antineoplastic chemotherapy: Secondary | ICD-10-CM | POA: Diagnosis not present

## 2020-01-20 DIAGNOSIS — C3490 Malignant neoplasm of unspecified part of unspecified bronchus or lung: Secondary | ICD-10-CM

## 2020-01-20 DIAGNOSIS — C3491 Malignant neoplasm of unspecified part of right bronchus or lung: Secondary | ICD-10-CM

## 2020-01-20 LAB — CMP (CANCER CENTER ONLY)
ALT: 18 U/L (ref 0–44)
AST: 22 U/L (ref 15–41)
Albumin: 3.8 g/dL (ref 3.5–5.0)
Alkaline Phosphatase: 71 U/L (ref 38–126)
Anion gap: 10 (ref 5–15)
BUN: 19 mg/dL (ref 8–23)
CO2: 28 mmol/L (ref 22–32)
Calcium: 9.9 mg/dL (ref 8.9–10.3)
Chloride: 103 mmol/L (ref 98–111)
Creatinine: 1.04 mg/dL — ABNORMAL HIGH (ref 0.44–1.00)
GFR, Est AFR Am: >60 mL/min (ref >60)
GFR, Estimated: 54 mL/min — ABNORMAL LOW (ref >60)
Glucose, Bld: 87 mg/dL (ref 70–99)
Potassium: 3.7 mmol/L (ref 3.5–5.1)
Sodium: 141 mmol/L (ref 135–145)
Total Bilirubin: 0.4 mg/dL (ref 0.3–1.2)
Total Protein: 6.7 g/dL (ref 6.5–8.1)

## 2020-01-20 LAB — CBC WITH DIFFERENTIAL (CANCER CENTER ONLY)
Abs Immature Granulocytes: 0.03 10*3/uL (ref 0.00–0.07)
Basophils Absolute: 0 10*3/uL (ref 0.0–0.1)
Basophils Relative: 0 %
Eosinophils Absolute: 0.1 10*3/uL (ref 0.0–0.5)
Eosinophils Relative: 2 %
HCT: 44.2 % (ref 36.0–46.0)
Hemoglobin: 14.1 g/dL (ref 12.0–15.0)
Immature Granulocytes: 1 %
Lymphocytes Relative: 19 %
Lymphs Abs: 1.1 10*3/uL (ref 0.7–4.0)
MCH: 29.4 pg (ref 26.0–34.0)
MCHC: 31.9 g/dL (ref 30.0–36.0)
MCV: 92.1 fL (ref 80.0–100.0)
Monocytes Absolute: 0.7 10*3/uL (ref 0.1–1.0)
Monocytes Relative: 13 %
Neutro Abs: 3.9 10*3/uL (ref 1.7–7.7)
Neutrophils Relative %: 65 %
Platelet Count: 221 10*3/uL (ref 150–400)
RBC: 4.8 MIL/uL (ref 3.87–5.11)
RDW: 13.2 % (ref 11.5–15.5)
WBC Count: 5.8 10*3/uL (ref 4.0–10.5)
nRBC: 0 % (ref 0.0–0.2)

## 2020-01-20 MED FILL — TAGRISSO 80 MG TABLET: 80 | 30 days supply | Qty: 30 | Fill #2

## 2020-01-20 NOTE — Telephone Encounter (Signed)
Scheduled per 8/9 los. Pt will be out of town the week of 10/8. Pt is aware of appt time and date. No avs or calendar needed.

## 2020-01-20 NOTE — Progress Notes (Signed)
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  PRIOR THERAPY: 1) Right VATS with right upper lobectomy with bronchoplasty closure and en bloc resection of a wedge from the superior segment of the right lower lobe in addition to mediastinal lymph node dissection on 07/30/2015 under the care of Dr. Roxan Hockey. 2) Adjuvant systemic chemotherapy with cisplatin 75 MG/M2 and Alimta 500 MG/M2 every 3 weeks. First dose was given on 09/10/2015. Status post 4 cycles. 3) adjuvant radiotherapy under the care of Dr. Tammi Klippel.  CURRENT THERAPY: Tagrisso 80 mg p.o. daily started 12/01/2018.  Status post 13 months of treatment.  INTERVAL HISTORY: [name] West 69 y.o. female returns to the clinic today for follow-up visit.  The patient is feeling fine today with no concerning complaints.  She denied having any chest pain, shortness of breath, cough or hemoptysis.  She denied having any fever or chills.  She has no nausea, vomiting, diarrhea or constipation.  She has no headache or visual changes.  She has no weight loss or night sweats.  She continues to tolerate her treatment with Tagrisso fairly well.  The patient is here today for evaluation and repeat blood work.  MEDICAL HISTORY: Past Medical History:  Diagnosis Date  . Anxiety   . Arthritis    shoulders  . Cancer ([place]) 06/2014   Lung Cancer  . Complication of anesthesia    nausea, does well with PROPOFOL   . Cough 09/17/2015  . Encounter for antineoplastic chemotherapy 10/22/2015  . Environmental and seasonal allergies   .  Family history of adverse reaction to anesthesia    Patients grandmother died while having hand surgery; pt unsure of cause  . Oral thrush 10/22/2015  . Ovarian cyst   . PONV (postoperative nausea and vomiting)   . Raynaud disease     ALLERGIES:  is allergic to avelox [moxifloxacin hcl in nacl], cephalosporins, clindamycin/lincomycin, penicillins, shrimp [shellfish allergy], macrobid [nitrofurantoin monohyd macro], omnipaque [iohexol], ciprofloxacin, adhesive [tape], doxycycline, lidoderm [lidocaine], pseudoephedrine, and sulfa antibiotics.  MEDICATIONS:  Current Outpatient Medications  Medication Sig Dispense Refill  . Azelastine HCl 0.15 % SOLN Inhale 1 spray into the lungs daily.    . cholecalciferol (VITAMIN D) 1000 UNITS tablet Take 1,000 Units by mouth daily.    . citalopram (CELEXA) 10 MG tablet Take 10 mg by mouth daily.    . Clobetasol Prop Oint-Coal Tar (CLOBETAPLUS OINTMENT EX) Apply 1 Dose topically daily.    Marland Kitchen ibuprofen (ADVIL,MOTRIN) 200 MG tablet Take 200 mg by mouth every 6 (six) hours as needed (pain).    . LORazepam (ATIVAN) 0.5 MG tablet Take 0.5 mg by mouth at bedtime as needed for anxiety or sleep.     . Multiple Vitamin (MULTIVITAMIN WITH MINERALS) TABS Take 1 tablet by mouth daily.    . NONFORMULARY OR COMPOUNDED ITEM Place 1 application vaginally 2 (two) times a week. Estradiol 0.02% Cream    . predniSONE (DELTASONE) 50 MG tablet Take one tablet 13 hours , one tablet 7 hours and 1 tablet 1 hours prior to CT scan for CT dye allergy. 3 tablet 3  . Probiotic  Product (ALIGN) 4 MG CAPS Take 1 capsule by mouth daily.    Marland Kitchen TAGRISSO 80 MG tablet TAKE 1 TABLET (80 MG TOTAL) BY MOUTH DAILY. 30 tablet 2   No current facility-administered medications for this visit.    SURGICAL HISTORY:  Past Surgical History:  Procedure Laterality Date  . BREAST EXCISIONAL BIOPSY Left    1996  . CESAREAN SECTION  1984  . COLONOSCOPY WITH PROPOFOL N/A 09/27/2012   Procedure: COLONOSCOPY  WITH PROPOFOL;  Surgeon: Garlan Fair, MD;  Location: [facility];  Service: Endoscopy;  Laterality: N/A;  . Fibroid Removed    . LAPAROTOMY Left 07/20/2016   Procedure: MINI LAPAROTOMY, REMOVAL OF LEFT OVARIAN CYST, LEFT SALPINGO-OOPHORECTOMY;  Surgeon: Dian Queen, MD;  Location: [facility];  Service: Gynecology;  Laterality: Left;  please moved to 7:30am if spot opens  . Right Rotator Cuff Right   . TOTAL SHOULDER ARTHROPLASTY Left 06/14/2018   Procedure: TOTAL SHOULDER ARTHROPLASTY;  Surgeon: Justice Britain, MD;  Location: [facility];  Service: Orthopedics;  Laterality: Left;  17mn  . VIDEO ASSISTED THORACOSCOPY (VATS)/ LOBECTOMY Right 07/30/2015   Procedure: RIGHT VIDEO ASSISTED THORACOSCOPY (VATS)/RIGHT UPPER LOBECTOMY;  Surgeon: SMelrose Nakayama MD;  [facility]  Service: Thoracic;  Laterality: Right;  .Marland KitchenVIDEO BRONCHOSCOPY WITH ENDOBRONCHIAL NAVIGATION N/A 07/15/2015   Procedure: VIDEO BRONCHOSCOPY WITH ENDOBRONCHIAL NAVIGATION with Biopsy;  Surgeon: RCollene Gobble MD;  Location: [facility];  Service: Thoracic;  Laterality: N/A;    REVIEW OF SYSTEMS:  A comprehensive review of systems was negative.   PHYSICAL EXAMINATION: General appearance: alert, cooperative and no distress Head: Normocephalic, without obvious abnormality, atraumatic Neck: no adenopathy, no JVD, supple, symmetrical, trachea midline and thyroid not enlarged, symmetric, no tenderness/mass/nodules Lymph nodes: Cervical, supraclavicular, and axillary nodes normal. Resp: clear to auscultation bilaterally Back: symmetric, no curvature. ROM normal. No CVA tenderness. Cardio: regular rate and rhythm, S1, S2 normal, no murmur, click, rub or gallop GI: soft, non-tender; bowel sounds normal; no masses,  no organomegaly Extremities: extremities normal, atraumatic, no cyanosis or edema  ECOG PERFORMANCE STATUS: 0 - Asymptomatic  Blood pressure (!) 103/53, pulse 80, temperature 98.1 F (36.7 C), temperature source Tympanic,  resp. rate 17, height 4' 11"  (1.499 m), weight 90 lb 9.6 oz (41.1 kg), SpO2 100 %.  LABORATORY DATA: Lab Results  Component Value Date   WBC 5.8 01/20/2020   HGB 14.1 01/20/2020   HCT 44.2 01/20/2020   MCV 92.1 01/20/2020   PLT 221 01/20/2020      Chemistry      Component Value Date/Time   NA 141 01/20/2020 0733   NA 139 06/16/2017 1132   K 3.7 01/20/2020 0733   K 4.7 06/16/2017 1132   CL 103 01/20/2020 0733   CO2 28 01/20/2020 0733   CO2 28 06/16/2017 1132   BUN 19 01/20/2020 0733   BUN 19.5 06/16/2017 1132   CREATININE 1.04 (H) 01/20/2020 0733   CREATININE 0.8 06/16/2017 1132      Component Value Date/Time   CALCIUM 9.9 01/20/2020 0733   CALCIUM 10.0 06/16/2017 1132   ALKPHOS 71 01/20/2020 0733   ALKPHOS 68 06/16/2017 1132   AST 22 01/20/2020 0733   AST 21 06/16/2017 1132   ALT 18 01/20/2020 0733   ALT 18 06/16/2017 1132   BILITOT 0.4 01/20/2020 0733   BILITOT 0.50 06/16/2017 1132       RADIOGRAPHIC STUDIES: No results found.  ASSESSMENT AND PLAN:  This is a very pleasant 69  years old white female with stage IIIA non-small cell lung cancer, adenocarcinoma with positive EGFR mutation in exon 19 status post right upper lobectomy with lymph node dissection followed by adjuvant systemic chemotherapy as well as adjuvant radiation. The patient has been on observation for almost 3 years now.  She is feeling fine with no concerning complaints.   Unfortunately her scan in June 2020 showed multiple tiny pulmonary nodules scattered throughout both lungs which many of them have increased in size and some new nodule suspected the previous scan.  The patient started treatment with Tagrisso 80 mg p.o. daily status post 13 months of treatment.   The patient continues to tolerate her treatment with Tagrisso fairly well.  Her lab work is unremarkable today. I recommended for her to continue her current treatment with Tagrisso with the same dose. She will come back for follow-up  visit in 2 weeks for evaluation with repeat CT scan of the chest, abdomen pelvis for restaging of her disease. The patient was advised to call immediately if she has any concerning symptoms in the interval. The patient voices understanding of current disease status and treatment options and is in agreement with the current care plan. All questions were answered. The patient knows to call the clinic with any problems, questions or concerns. We can certainly see the patient much sooner if necessary.  Disclaimer: This note was dictated with voice recognition software. Similar sounding words can inadvertently be transcribed and may not be corrected upon review.

## 2020-01-21 ENCOUNTER — Telehealth: Payer: Self-pay

## 2020-01-21 ENCOUNTER — Other Ambulatory Visit: Payer: Self-pay

## 2020-01-21 ENCOUNTER — Encounter: Payer: Self-pay | Admitting: Internal Medicine

## 2020-01-21 DIAGNOSIS — Z91041 Radiographic dye allergy status: Secondary | ICD-10-CM

## 2020-01-21 MED ORDER — PREDNISONE 50 MG PO TABS: ORAL_TABLET | ORAL | 3 refills | Status: DC

## 2020-01-21 NOTE — Telephone Encounter (Signed)
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 Pt verbalized understanding.

## 2020-01-21 NOTE — Telephone Encounter (Signed)
Per Cassie PA refilled pts prednisone medication as requested. Pt stated that she needed her prednisone for her scan. Refilled and sent to [facility]. Pt made aware.

## 2020-01-22 DIAGNOSIS — M545 Low back pain: Secondary | ICD-10-CM | POA: Diagnosis not present

## 2020-01-22 DIAGNOSIS — M25551 Pain in right hip: Secondary | ICD-10-CM | POA: Diagnosis not present

## 2020-01-27 ENCOUNTER — Encounter: Payer: Self-pay | Admitting: Internal Medicine

## 2020-01-27 DIAGNOSIS — Z23 Encounter for immunization: Secondary | ICD-10-CM | POA: Diagnosis not present

## 2020-02-07 ENCOUNTER — Ambulatory Visit
Admission: RE | Admit: 2020-02-07 | Discharge: 2020-02-07 | Disposition: A | Discharge status: Home / Self Care | Payer: Medicare Other | Source: Ambulatory Visit | Attending: Family Medicine | Admitting: Family Medicine

## 2020-02-07 ENCOUNTER — Other Ambulatory Visit: Payer: Self-pay

## 2020-02-07 DIAGNOSIS — Z1231 Encounter for screening mammogram for malignant neoplasm of breast: Secondary | ICD-10-CM

## 2020-02-11 DIAGNOSIS — L03031 Cellulitis of right toe: Secondary | ICD-10-CM | POA: Diagnosis not present

## 2020-02-11 DIAGNOSIS — C3491 Malignant neoplasm of unspecified part of right bronchus or lung: Secondary | ICD-10-CM | POA: Diagnosis not present

## 2020-02-11 DIAGNOSIS — M810 Age-related osteoporosis without current pathological fracture: Secondary | ICD-10-CM | POA: Diagnosis not present

## 2020-02-11 DIAGNOSIS — M8580 Other specified disorders of bone density and structure, unspecified site: Secondary | ICD-10-CM | POA: Diagnosis not present

## 2020-02-11 DIAGNOSIS — I2510 Atherosclerotic heart disease of native coronary artery without angina pectoris: Secondary | ICD-10-CM | POA: Diagnosis not present

## 2020-02-12 ENCOUNTER — Other Ambulatory Visit: Payer: Self-pay | Admitting: Physician Assistant

## 2020-02-12 ENCOUNTER — Encounter: Payer: Self-pay | Admitting: Internal Medicine

## 2020-02-12 DIAGNOSIS — C3491 Malignant neoplasm of unspecified part of right bronchus or lung: Secondary | ICD-10-CM

## 2020-02-18 MED FILL — TAGRISSO 80 MG TABLET: 80 | 30 days supply | Qty: 30 | Fill #0

## 2020-02-20 DIAGNOSIS — L03031 Cellulitis of right toe: Secondary | ICD-10-CM | POA: Diagnosis not present

## 2020-02-26 ENCOUNTER — Ambulatory Visit
Admission: RE | Admit: 2020-02-26 | Discharge: 2020-02-26 | Disposition: A | Discharge status: Home / Self Care | Payer: Medicare Other | Source: Ambulatory Visit | Attending: Family Medicine | Admitting: Family Medicine

## 2020-02-26 ENCOUNTER — Other Ambulatory Visit: Payer: Self-pay

## 2020-02-26 DIAGNOSIS — Z1231 Encounter for screening mammogram for malignant neoplasm of breast: Secondary | ICD-10-CM | POA: Diagnosis not present

## 2020-03-03 DIAGNOSIS — Z23 Encounter for immunization: Secondary | ICD-10-CM | POA: Diagnosis not present

## 2020-03-04 DIAGNOSIS — M25552 Pain in left hip: Secondary | ICD-10-CM | POA: Diagnosis not present

## 2020-03-04 DIAGNOSIS — M25551 Pain in right hip: Secondary | ICD-10-CM | POA: Diagnosis not present

## 2020-03-11 DIAGNOSIS — M25551 Pain in right hip: Secondary | ICD-10-CM | POA: Diagnosis not present

## 2020-03-11 DIAGNOSIS — M25552 Pain in left hip: Secondary | ICD-10-CM | POA: Diagnosis not present

## 2020-03-12 DIAGNOSIS — E782 Mixed hyperlipidemia: Secondary | ICD-10-CM | POA: Diagnosis not present

## 2020-03-12 DIAGNOSIS — I2510 Atherosclerotic heart disease of native coronary artery without angina pectoris: Secondary | ICD-10-CM | POA: Diagnosis not present

## 2020-03-12 DIAGNOSIS — I2584 Coronary atherosclerosis due to calcified coronary lesion: Secondary | ICD-10-CM | POA: Diagnosis not present

## 2020-03-13 LAB — NMR, LIPOPROFILE
Cholesterol, Total: 197 mg/dL (ref 100–199)
HDL Particle Number: 34.4 umol/L (ref >=30.5)
HDL-C: 85 mg/dL (ref >39)
LDL Particle Number: 844 nmol/L (ref <1000)
LDL Size: 21.3 nm (ref >20.5)
LDL-C (NIH Calc): 96 mg/dL (ref 0–99)
LP-IR Score: <25 (ref <=45)
Small LDL Particle Number: <90 nmol/L (ref <=527)
Triglycerides: 92 mg/dL (ref 0–149)

## 2020-03-18 DIAGNOSIS — M25552 Pain in left hip: Secondary | ICD-10-CM | POA: Diagnosis not present

## 2020-03-18 DIAGNOSIS — M25551 Pain in right hip: Secondary | ICD-10-CM | POA: Diagnosis not present

## 2020-03-19 MED FILL — TAGRISSO 80 MG TABLET: 80 | 30 days supply | Qty: 30 | Fill #1

## 2020-03-23 DIAGNOSIS — M25551 Pain in right hip: Secondary | ICD-10-CM | POA: Diagnosis not present

## 2020-03-23 DIAGNOSIS — M25552 Pain in left hip: Secondary | ICD-10-CM | POA: Diagnosis not present

## 2020-03-24 ENCOUNTER — Ambulatory Visit (HOSPITAL_COMMUNITY)
Admission: RE | Admit: 2020-03-24 | Discharge: 2020-03-24 | Disposition: A | Discharge status: Home / Self Care | Payer: Medicare Other | Source: Ambulatory Visit | Attending: Internal Medicine | Admitting: Internal Medicine

## 2020-03-24 ENCOUNTER — Encounter (HOSPITAL_COMMUNITY): Payer: Self-pay

## 2020-03-24 ENCOUNTER — Other Ambulatory Visit: Payer: Self-pay

## 2020-03-24 ENCOUNTER — Inpatient Hospital Stay: Payer: Medicare Other | Attending: Internal Medicine

## 2020-03-24 DIAGNOSIS — Z902 Acquired absence of lung [part of]: Secondary | ICD-10-CM | POA: Insufficient documentation

## 2020-03-24 DIAGNOSIS — Z9221 Personal history of antineoplastic chemotherapy: Secondary | ICD-10-CM | POA: Insufficient documentation

## 2020-03-24 DIAGNOSIS — Z923 Personal history of irradiation: Secondary | ICD-10-CM | POA: Insufficient documentation

## 2020-03-24 DIAGNOSIS — M47816 Spondylosis without myelopathy or radiculopathy, lumbar region: Secondary | ICD-10-CM | POA: Diagnosis not present

## 2020-03-24 DIAGNOSIS — C3411 Malignant neoplasm of upper lobe, right bronchus or lung: Secondary | ICD-10-CM | POA: Diagnosis not present

## 2020-03-24 DIAGNOSIS — R918 Other nonspecific abnormal finding of lung field: Secondary | ICD-10-CM | POA: Insufficient documentation

## 2020-03-24 DIAGNOSIS — C3490 Malignant neoplasm of unspecified part of unspecified bronchus or lung: Secondary | ICD-10-CM | POA: Insufficient documentation

## 2020-03-24 DIAGNOSIS — I700 Atherosclerosis of aorta: Secondary | ICD-10-CM | POA: Diagnosis not present

## 2020-03-24 DIAGNOSIS — I2510 Atherosclerotic heart disease of native coronary artery without angina pectoris: Secondary | ICD-10-CM | POA: Diagnosis not present

## 2020-03-24 LAB — CBC WITH DIFFERENTIAL (CANCER CENTER ONLY)
Abs Immature Granulocytes: 0.03 10*3/uL (ref 0.00–0.07)
Basophils Absolute: 0 10*3/uL (ref 0.0–0.1)
Basophils Relative: 0 %
Eosinophils Absolute: 0 10*3/uL (ref 0.0–0.5)
Eosinophils Relative: 0 %
HCT: 43.9 % (ref 36.0–46.0)
Hemoglobin: 14.3 g/dL (ref 12.0–15.0)
Immature Granulocytes: 0 %
Lymphocytes Relative: 10 %
Lymphs Abs: 0.7 10*3/uL (ref 0.7–4.0)
MCH: 29.7 pg (ref 26.0–34.0)
MCHC: 32.6 g/dL (ref 30.0–36.0)
MCV: 91.3 fL (ref 80.0–100.0)
Monocytes Absolute: 0.1 10*3/uL (ref 0.1–1.0)
Monocytes Relative: 2 %
Neutro Abs: 6.4 10*3/uL (ref 1.7–7.7)
Neutrophils Relative %: 88 %
Platelet Count: 228 10*3/uL (ref 150–400)
RBC: 4.81 MIL/uL (ref 3.87–5.11)
RDW: 13.1 % (ref 11.5–15.5)
WBC Count: 7.3 10*3/uL (ref 4.0–10.5)
nRBC: 0 % (ref 0.0–0.2)

## 2020-03-24 LAB — CMP (CANCER CENTER ONLY)
ALT: 23 U/L (ref 0–44)
AST: 22 U/L (ref 15–41)
Albumin: 3.9 g/dL (ref 3.5–5.0)
Alkaline Phosphatase: 82 U/L (ref 38–126)
Anion gap: 11 (ref 5–15)
BUN: 21 mg/dL (ref 8–23)
CO2: 25 mmol/L (ref 22–32)
Calcium: 9.8 mg/dL (ref 8.9–10.3)
Chloride: 100 mmol/L (ref 98–111)
Creatinine: 0.96 mg/dL (ref 0.44–1.00)
GFR, Estimated: 60 mL/min — ABNORMAL LOW (ref >60)
Glucose, Bld: 168 mg/dL — ABNORMAL HIGH (ref 70–99)
Potassium: 4.1 mmol/L (ref 3.5–5.1)
Sodium: 136 mmol/L (ref 135–145)
Total Bilirubin: 0.3 mg/dL (ref 0.3–1.2)
Total Protein: 7 g/dL (ref 6.5–8.1)

## 2020-03-24 MED ORDER — IOHEXOL 300 MG/ML  SOLN
75.0000 mL | Freq: Once | INTRAMUSCULAR | Status: AC | PRN
  Administered 2020-03-24: 75 mL via INTRAVENOUS

## 2020-03-25 ENCOUNTER — Encounter: Payer: Self-pay | Admitting: Internal Medicine

## 2020-03-25 ENCOUNTER — Ambulatory Visit (INDEPENDENT_AMBULATORY_CARE_PROVIDER_SITE_OTHER): Payer: Medicare Other | Admitting: Internal Medicine

## 2020-03-25 VITALS — BP 100/62 | HR 87 | Ht 59.5 in | Wt 93.0 lb

## 2020-03-25 DIAGNOSIS — I2510 Atherosclerotic heart disease of native coronary artery without angina pectoris: Secondary | ICD-10-CM

## 2020-03-25 DIAGNOSIS — E782 Mixed hyperlipidemia: Secondary | ICD-10-CM

## 2020-03-25 DIAGNOSIS — C3491 Malignant neoplasm of unspecified part of right bronchus or lung: Secondary | ICD-10-CM | POA: Diagnosis not present

## 2020-03-25 DIAGNOSIS — M8580 Other specified disorders of bone density and structure, unspecified site: Secondary | ICD-10-CM | POA: Diagnosis not present

## 2020-03-25 DIAGNOSIS — M25552 Pain in left hip: Secondary | ICD-10-CM | POA: Diagnosis not present

## 2020-03-25 DIAGNOSIS — M810 Age-related osteoporosis without current pathological fracture: Secondary | ICD-10-CM | POA: Diagnosis not present

## 2020-03-25 DIAGNOSIS — I2584 Coronary atherosclerosis due to calcified coronary lesion: Secondary | ICD-10-CM | POA: Diagnosis not present

## 2020-03-25 DIAGNOSIS — M25551 Pain in right hip: Secondary | ICD-10-CM | POA: Diagnosis not present

## 2020-03-25 NOTE — Progress Notes (Signed)
OFFICE CONSULT NOTE  Chief Complaint:  Follow-up  Primary Care Physician: [name], MD  HPI:  [name] West is a 69 y.o. female who is being seen today for the evaluation of coronary artery calcification at the request of [name], MD.  This is a pleasant 69 year old female with unfortunate history of non-small cell lung cancer.  She underwent lobectomy by Dr. Roxan Hockey in 2017 with radiation chemotherapy.  Fortunately she seems to be cancer free with some recurrent scans.  Recently she is developed some inguinal lymphadenopathy.  She is on antibiotics for this however her is concerned about a neoplastic process.  Her most recent screening CT scan of the chest did demonstrate coronary artery calcification both in the LAD as well as aortic atherosclerosis which was calcified.  Family history is significant for a brother 36 years older who has coronary artery disease and has had an MI and CABG (however she notes he is live with HIV disease on HAART for more than 30 years).  She denies any history of diabetes or hypertension in fact does not take any daily medications.  Her last lipid profile was November 2018 which showed total cholesterol 210, HDL 81, triglycerides 65 and LDL 116.  She is asymptomatic from an anginal standpoint, but does report shortness of breath with exertion.  She felt like this may mostly be related to her lung cancer and the fact that she has had partial lung removal.  12/22/2017  [name] West returns today for follow-up of her stress echocardiogram.  Fortunately this was a negative study without any exercise-induced wall motion abnormalities and normal LV function.  Overall she feels well except for ongoing shoulder problems.  She is scheduled for sold shoulder surgery in January.  She is supposed to have another CT scan of her chest in December.  We discussed the fact that she does have coronary disease as evidenced by her coronary artery calcifications.   Although her stress test was low risk, her goal LDL is less than 70.  Currently her LDL is 116.  Given her body habitus, thin weight and fairly healthy diet, is unlikely she is going get a significant increased improvement in her numbers.  I did repeat a lipid profile which was an NMR.  Her LDL-C is 110, and her LDL-P is 1151.  Total cholesterol is 219 and LPA was negative at 21.   03/14/2019  [name] West is seen today in follow-up.  She did undergo successful left shoulder replacement.  Overall she is pleased with that.  She is scheduled to have another CT scan in November of her chest for follow-up of cancer.  This had shown coronary calcification and I recommended statin therapy.  She has been somewhat hesitant to do that.  Recently we repeated an NMR lipid profile.  Her total particle number went from 1151 down to 854.  LDL still remains elevated 102 and HDL was 100, triglycerides 57.  03/25/2020  [name] West returns today for annual follow-up.  Overall she continues to do well.  Denies any chest pain or shortness of breath.  She has had no recurrence of her lung cancer as of recent CT scan.  She does have some LAD atherosclerosis.  Her NMR LDL-P was 844, total cholesterol 197, triglycerides 92, HDL 85 and LDL of 96.  Overall representing good control over her lipids.  She is not currently on any therapy.  PMHx:  Past Medical History:  Diagnosis Date  . Anxiety   .  Arthritis    shoulders  . Cancer ([place]) 06/2014   Lung Cancer  . Complication of anesthesia    nausea, does well with PROPOFOL   . Cough 09/17/2015  . Encounter for antineoplastic chemotherapy 10/22/2015  . Environmental and seasonal allergies   . Family history of adverse reaction to anesthesia    Patients grandmother died while having hand surgery; pt unsure of cause  . Oral thrush 10/22/2015  . Ovarian cyst   . PONV (postoperative nausea and vomiting)   . Raynaud disease     Past Surgical History:  Procedure Laterality Date   . BREAST EXCISIONAL BIOPSY Left    1996  . CESAREAN SECTION  1984  . COLONOSCOPY WITH PROPOFOL N/A 09/27/2012   Procedure: COLONOSCOPY WITH PROPOFOL;  Surgeon: Garlan Fair, MD;  Location: [facility];  Service: Endoscopy;  Laterality: N/A;  . Fibroid Removed    . LAPAROTOMY Left 07/20/2016   Procedure: MINI LAPAROTOMY, REMOVAL OF LEFT OVARIAN CYST, LEFT SALPINGO-OOPHORECTOMY;  Surgeon: Dian Queen, MD;  Location: [facility];  Service: Gynecology;  Laterality: Left;  please moved to 7:30am if spot opens  . Right Rotator Cuff Right   . TOTAL SHOULDER ARTHROPLASTY Left 06/14/2018   Procedure: TOTAL SHOULDER ARTHROPLASTY;  Surgeon: Justice Britain, MD;  Location: [facility];  Service: Orthopedics;  Laterality: Left;  126min  . VIDEO ASSISTED THORACOSCOPY (VATS)/ LOBECTOMY Right 07/30/2015   Procedure: RIGHT VIDEO ASSISTED THORACOSCOPY (VATS)/RIGHT UPPER LOBECTOMY;  Surgeon: Melrose Nakayama, MD;  Location: [facility];  Service: Thoracic;  Laterality: Right;  Marland Kitchen VIDEO BRONCHOSCOPY WITH ENDOBRONCHIAL NAVIGATION N/A 07/15/2015   Procedure: VIDEO BRONCHOSCOPY WITH ENDOBRONCHIAL NAVIGATION with Biopsy;  Surgeon: Collene Gobble, MD;  Location: [facility];  Service: Thoracic;  Laterality: N/A;    FAMHx:  Family History  Problem Relation Age of Onset  . Heart disease Brother   . Heart attack Brother     SOCHx:   reports that she has never smoked. She has never used smokeless tobacco. She reports current alcohol use. She reports that she does not use drugs.  ALLERGIES:  Allergies  Allergen Reactions  . Avelox [Moxifloxacin Hcl In Nacl] Anaphylaxis  . Cephalosporins Anaphylaxis  . Clindamycin/Lincomycin Anaphylaxis  . Penicillins Anaphylaxis and Other (See Comments)    Has patient had a PCN reaction causing immediate rash, facial/tongue/throat swelling, SOB or lightheadedness with hypotension: Yes Has patient had a PCN reaction causing severe rash involving mucus membranes or skin necrosis: No Has patient  had a PCN reaction that required hospitalization Yes Has patient had a PCN reaction occurring within the last 10 years: No If all of the above answers are "NO", then may proceed with Cephalosporin use.   . Shrimp [Shellfish Allergy] Diarrhea    "violent reaction to :shrimp and scallops but not other shell fish-vomiting and diarrhea"  . Macrobid [employer] Macro] Other (See Comments)    CHEST TIGHTNESS...Marland KitchenHAD USED BEFORE WITH NO REACTION  . Omnipaque [Iohexol] Hives  . Ciprofloxacin Other (See Comments)    Unknown  . Adhesive [Tape] Rash  . Doxycycline Other (See Comments)    Frequently urination   . Lidoderm [Lidocaine] Rash  . Pseudoephedrine Palpitations  . Sulfa Antibiotics Rash    ROS: Pertinent items noted in HPI and remainder of comprehensive ROS otherwise negative.  HOME MEDS: Current Outpatient Medications on File Prior to Visit  Medication Sig Dispense Refill  . cholecalciferol (VITAMIN D) 1000 UNITS tablet Take 1,000 Units by mouth daily.    Marland Kitchen  citalopram (CELEXA) 10 MG tablet Take 10 mg by mouth daily.    . Clobetasol Prop Oint-Coal Tar (CLOBETAPLUS OINTMENT EX) Apply 1 Dose topically daily.    Marland Kitchen ibuprofen (ADVIL,MOTRIN) 200 MG tablet Take 200 mg by mouth every 6 (six) hours as needed (pain).    . LORazepam (ATIVAN) 0.5 MG tablet Take 0.5 mg by mouth at bedtime as needed for anxiety or sleep.     . Multiple Vitamin (MULTIVITAMIN WITH MINERALS) TABS Take 1 tablet by mouth daily.    . NONFORMULARY OR COMPOUNDED ITEM Place 1 application vaginally 2 (two) times a week. Estradiol 0.02% Cream    . predniSONE (DELTASONE) 50 MG tablet Take one tablet 13 hours , one tablet 7 hours and 1 tablet 1 hours prior to CT scan for CT dye allergy. 3 tablet 3  . Probiotic Product (ALIGN) 4 MG CAPS Take 1 capsule by mouth daily.    Marland Kitchen TAGRISSO 80 MG tablet TAKE 1 TABLET (80 MG TOTAL) BY MOUTH DAILY. 30 tablet 2   No current facility-administered medications on file prior to  visit.    LABS/IMAGING: Results for orders placed or performed in visit on 03/24/20 (from the past 48 hour(s))  CMP ([facility] only)     Status: Abnormal   Collection Time: 03/24/20  9:47 AM  Result Value Ref Range   Sodium 136 135 - 145 mmol/L   Potassium 4.1 3.5 - 5.1 mmol/L   Chloride 100 98 - 111 mmol/L   CO2 25 22 - 32 mmol/L   Glucose, Bld 168 (H) 70 - 99 mg/dL    Comment: Glucose reference range applies only to samples taken after fasting for at least 8 hours.   BUN 21 8 - 23 mg/dL   Creatinine 0.96 0.44 - 1.00 mg/dL   Calcium 9.8 8.9 - 10.3 mg/dL   Total Protein 7.0 6.5 - 8.1 g/dL   Albumin 3.9 3.5 - 5.0 g/dL   AST 22 15 - 41 U/L   ALT 23 0 - 44 U/L   Alkaline Phosphatase 82 38 - 126 U/L   Total Bilirubin 0.3 0.3 - 1.2 mg/dL   GFR, Estimated 60 (L) >60 mL/min   Anion gap 11 5 - 15    Comment: Performed at [facility], [place] [address] [place], [place] [address]  CBC with Differential ([facility] Only)     Status: None   Collection Time: 03/24/20  9:47 AM  Result Value Ref Range   WBC Count 7.3 4.0 - 10.5 K/uL   RBC 4.81 3.87 - 5.11 MIL/uL   Hemoglobin 14.3 12.0 - 15.0 g/dL   HCT 43.9 36 - 46 %   MCV 91.3 80.0 - 100.0 fL   MCH 29.7 26.0 - 34.0 pg   MCHC 32.6 30.0 - 36.0 g/dL   RDW 13.1 11.5 - 15.5 %   Platelet Count 228 150 - 400 K/uL   nRBC 0.0 0.0 - 0.2 %   Neutrophils Relative % 88 %   Neutro Abs 6.4 1.7 - 7.7 K/uL   Lymphocytes Relative 10 %   Lymphs Abs 0.7 0.7 - 4.0 K/uL   Monocytes Relative 2 %   Monocytes Absolute 0.1 0.1 - 1.0 K/uL   Eosinophils Relative 0 %   Eosinophils Absolute 0.0 0.0 - 0.5 K/uL   Basophils Relative 0 %   Basophils Absolute 0.0 0.0 - 0.1 K/uL   Immature Granulocytes 0 %   Abs Immature Granulocytes 0.03 0.00 - 0.07  K/uL    Comment: Performed at [facility], [place] [address], [place], [place] [address]   CT Chest W Contrast  Result Date: 03/24/2020 CLINICAL DATA:  Status  post right upper lobectomy for non-small cell lung cancer in 2017 with adjuvant chemotherapy and radiation therapy and ongoing oral chemotherapy. Restaging. EXAM: CT CHEST, ABDOMEN, AND PELVIS WITH CONTRAST TECHNIQUE: Multidetector CT imaging of the chest, abdomen and pelvis was performed following the standard protocol during bolus administration of intravenous contrast. CONTRAST:  46mL OMNIPAQUE IOHEXOL 300 MG/ML  SOLN COMPARISON:  08/15/2019 CT chest, abdomen and pelvis. 11/12/2019 chest CT. FINDINGS: CT CHEST FINDINGS Cardiovascular: Normal heart size. No significant pericardial effusion/thickening. Left anterior descending coronary atherosclerosis. Great vessels are normal in course and caliber. No central pulmonary emboli. Mediastinum/Nodes: No discrete thyroid nodules. Unremarkable esophagus. No pathologically enlarged axillary, mediastinal or hilar lymph nodes. Lungs/Pleura: No pneumothorax. No pleural effusion. Status post right upper lobectomy. No acute consolidative airspace disease or lung masses. Several scattered small solid pulmonary nodules throughout both lungs measuring up to 5 mm in the posterior right lower lobe (series 6/image 78), all stable since 11/12/2019 chest CT. No new significant pulmonary nodules. Stable mild sharply marginated patchy consolidation and ground-glass opacity in medial upper right lung with associated mild volume loss and distortion, compatible with post treatment/postsurgical change. Musculoskeletal: No aggressive appearing focal osseous lesions. Partially visualized left shoulder arthroplasty. CT ABDOMEN PELVIS FINDINGS Hepatobiliary: Normal liver with no liver mass. Normal gallbladder with no radiopaque cholelithiasis. No biliary ductal dilatation. Pancreas: Normal, with no mass or duct dilation. Spleen: Normal size. No mass. Adrenals/Urinary Tract: Normal adrenals. Normal kidneys with no hydronephrosis and no renal mass. Normal bladder. Stomach/Bowel: Normal  non-distended stomach. Normal caliber small bowel with no small bowel wall thickening. Appendix not discretely visualized. Diffuse moderate to large colonic stool volume. No large bowel wall thickening, diverticulosis or significant pericolonic fat stranding. Vascular/Lymphatic: Atherosclerotic nonaneurysmal abdominal aorta. Patent portal, splenic, hepatic and renal veins. No pathologically enlarged lymph nodes in the abdomen or pelvis. Reproductive: Grossly normal uterus.  No adnexal mass. Other: No pneumoperitoneum, ascites or focal fluid collection. Musculoskeletal: No aggressive appearing focal osseous lesions. Mild lumbar spondylosis. IMPRESSION: 1. No evidence of local tumor recurrence status post right upper lobectomy. 2. Scattered small bilateral pulmonary nodules are stable. No new or progressive findings in the chest to suggest metastatic disease. 3. No findings of metastatic disease in the abdomen or pelvis. 4. Diffuse moderate to large colonic stool volume, suggesting constipation. 5. Aortic Atherosclerosis (ICD10-I70.0). Electronically Signed   By: Ilona Sorrel M.D.   On: 03/24/2020 14:24   CT Abdomen Pelvis W Contrast  Result Date: 03/24/2020 CLINICAL DATA:  Status post right upper lobectomy for non-small cell lung cancer in 2017 with adjuvant chemotherapy and radiation therapy and ongoing oral chemotherapy. Restaging. EXAM: CT CHEST, ABDOMEN, AND PELVIS WITH CONTRAST TECHNIQUE: Multidetector CT imaging of the chest, abdomen and pelvis was performed following the standard protocol during bolus administration of intravenous contrast. CONTRAST:  8mL OMNIPAQUE IOHEXOL 300 MG/ML  SOLN COMPARISON:  08/15/2019 CT chest, abdomen and pelvis. 11/12/2019 chest CT. FINDINGS: CT CHEST FINDINGS Cardiovascular: Normal heart size. No significant pericardial effusion/thickening. Left anterior descending coronary atherosclerosis. Great vessels are normal in course and caliber. No central pulmonary emboli.  Mediastinum/Nodes: No discrete thyroid nodules. Unremarkable esophagus. No pathologically enlarged axillary, mediastinal or hilar lymph nodes. Lungs/Pleura: No pneumothorax. No pleural effusion. Status post right upper lobectomy. No acute consolidative airspace disease or lung masses.  Several scattered small solid pulmonary nodules throughout both lungs measuring up to 5 mm in the posterior right lower lobe (series 6/image 78), all stable since 11/12/2019 chest CT. No new significant pulmonary nodules. Stable mild sharply marginated patchy consolidation and ground-glass opacity in medial upper right lung with associated mild volume loss and distortion, compatible with post treatment/postsurgical change. Musculoskeletal: No aggressive appearing focal osseous lesions. Partially visualized left shoulder arthroplasty. CT ABDOMEN PELVIS FINDINGS Hepatobiliary: Normal liver with no liver mass. Normal gallbladder with no radiopaque cholelithiasis. No biliary ductal dilatation. Pancreas: Normal, with no mass or duct dilation. Spleen: Normal size. No mass. Adrenals/Urinary Tract: Normal adrenals. Normal kidneys with no hydronephrosis and no renal mass. Normal bladder. Stomach/Bowel: Normal non-distended stomach. Normal caliber small bowel with no small bowel wall thickening. Appendix not discretely visualized. Diffuse moderate to large colonic stool volume. No large bowel wall thickening, diverticulosis or significant pericolonic fat stranding. Vascular/Lymphatic: Atherosclerotic nonaneurysmal abdominal aorta. Patent portal, splenic, hepatic and renal veins. No pathologically enlarged lymph nodes in the abdomen or pelvis. Reproductive: Grossly normal uterus.  No adnexal mass. Other: No pneumoperitoneum, ascites or focal fluid collection. Musculoskeletal: No aggressive appearing focal osseous lesions. Mild lumbar spondylosis. IMPRESSION: 1. No evidence of local tumor recurrence status post right upper lobectomy. 2. Scattered  small bilateral pulmonary nodules are stable. No new or progressive findings in the chest to suggest metastatic disease. 3. No findings of metastatic disease in the abdomen or pelvis. 4. Diffuse moderate to large colonic stool volume, suggesting constipation. 5. Aortic Atherosclerosis (ICD10-I70.0). Electronically Signed   By: Ilona Sorrel M.D.   On: 03/24/2020 14:24    LIPID PANEL: No results found for: CHOL, TRIG, HDL, CHOLHDL, VLDL, LDLCALC, LDLDIRECT  WEIGHTS: Wt Readings from Last 3 Encounters:  03/25/20 93 lb (42.2 kg)  01/20/20 90 lb 9.6 oz (41.1 kg)  11/13/19 92 lb 8 oz (42 kg)    VITALS: BP 100/62 (BP Location: Left Arm, Patient Position: Sitting, Cuff Size: Normal)   Pulse 87   Ht 4' 11.5" (1.511 m)   Wt 93 lb (42.2 kg)   BMI 18.47 kg/m   EXAM: General appearance: alert and no distress Neck: no carotid bruit, no JVD and thyroid not enlarged, symmetric, no tenderness/mass/nodules Lungs: clear to auscultation bilaterally Heart: regular rate and rhythm, S1, S2 normal, no murmur, click, rub or gallop Abdomen: soft, non-tender; bowel sounds normal; no masses,  no organomegaly Extremities: extremities normal, atraumatic, no cyanosis or edema Pulses: 2+ and symmetric Skin: Skin color, texture, turgor normal. No rashes or lesions Neurologic: Grossly normal Psych: Pleasant  EKG: Normal sinus rhythm 87-personally reviewed  ASSESSMENT: 1. Coronary artery calcification-LAD and aortic atherosclerosis (negative stress echocardiogram (03/2018)) 2. 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 Family history of coronary artery disease 4. Dyslipidemia-goal LDL less than 70  PLAN: 1.   [name] West continues to do well.  She was seen to have some LAD and aortic atherosclerosis which is stable on CT.  Her lipids are excellent to target but not on therapy.  There is family history of coronary disease but I do not think that there is enough evidence to  recommend additional therapy at this time.  Her lipids continue to decline. No further recommendations at this time.  Follow-up annually or sooner as necessary.  Pixie Casino, MD, [facility], [place] Director of the Advanced Lipid Disorders &  Cardiovascular Risk Reduction Clinic Diplomate of the [employer] of  Clinical Lipidology Attending Cardiologist  Direct Dial: [phone]  Fax: [phone]  Website:  [email or web address] 03/25/2020, 8:32 AM

## 2020-03-25 NOTE — Patient Instructions (Signed)
Medication Instructions:  Your physician recommends that you continue on your current medications as directed. Please refer to the Current Medication list given to you today.  *If you need a refill on your cardiac medications before your next appointment, please call your pharmacy*   Lab Work: FASTING lab work before next visit with Dr. Debara Pickett   If you have labs (blood work) drawn today and your tests are completely normal, you will receive your results only by: Marland Kitchen MyChart Message (if you have MyChart) OR . A paper copy in the mail If you have any lab test that is abnormal or we need to change your treatment, we will call you to review the results.  Follow-Up: At [facility], you and your health needs are our priority.  As part of our continuing mission to provide you with exceptional heart care, we have created designated Provider Care Teams.  These Care Teams include your primary Cardiologist (physician) and Advanced Practice Providers (APPs -  Physician Assistants and Nurse Practitioners) who all work together to provide you with the care you need, when you need it.  We recommend signing up for the patient portal called "MyChart".  Sign up information is provided on this After Visit Summary.  MyChart is used to connect with patients for Virtual Visits (Telemedicine).  Patients are able to view lab/test results, encounter notes, upcoming appointments, etc.  Non-urgent messages can be sent to your provider as well.   To learn more about what you can do with MyChart, go to [email or web address].    Your next appointment:   12 month(s)  The format for your next appointment:   In Person  Provider:   You may see Pixie Casino, MD or one of the following Advanced Practice Providers on your designated Care Team:    Almyra Deforest, PA-C  Fabian Sharp, PA-C or   Roby Lofts, [place]    Other Instructions

## 2020-03-26 ENCOUNTER — Telehealth: Payer: Self-pay | Admitting: Internal Medicine

## 2020-03-26 ENCOUNTER — Encounter: Payer: Self-pay | Admitting: Internal Medicine

## 2020-03-26 ENCOUNTER — Inpatient Hospital Stay (HOSPITAL_BASED_OUTPATIENT_CLINIC_OR_DEPARTMENT_OTHER): Payer: Medicare Other | Admitting: Internal Medicine

## 2020-03-26 ENCOUNTER — Other Ambulatory Visit: Payer: Self-pay

## 2020-03-26 VITALS — BP 118/67 | HR 86 | Temp 97.8°F | Resp 12 | Ht 59.0 in | Wt 91.4 lb

## 2020-03-26 DIAGNOSIS — Z902 Acquired absence of lung [part of]: Secondary | ICD-10-CM | POA: Diagnosis not present

## 2020-03-26 DIAGNOSIS — I2584 Coronary atherosclerosis due to calcified coronary lesion: Secondary | ICD-10-CM

## 2020-03-26 DIAGNOSIS — Z9221 Personal history of antineoplastic chemotherapy: Secondary | ICD-10-CM | POA: Diagnosis not present

## 2020-03-26 DIAGNOSIS — C3491 Malignant neoplasm of unspecified part of right bronchus or lung: Secondary | ICD-10-CM

## 2020-03-26 DIAGNOSIS — C3411 Malignant neoplasm of upper lobe, right bronchus or lung: Secondary | ICD-10-CM | POA: Diagnosis not present

## 2020-03-26 DIAGNOSIS — Z5111 Encounter for antineoplastic chemotherapy: Secondary | ICD-10-CM | POA: Diagnosis not present

## 2020-03-26 DIAGNOSIS — I2510 Atherosclerotic heart disease of native coronary artery without angina pectoris: Secondary | ICD-10-CM

## 2020-03-26 DIAGNOSIS — Z923 Personal history of irradiation: Secondary | ICD-10-CM | POA: Diagnosis not present

## 2020-03-26 DIAGNOSIS — R918 Other nonspecific abnormal finding of lung field: Secondary | ICD-10-CM | POA: Diagnosis not present

## 2020-03-26 NOTE — Telephone Encounter (Signed)
Scheduled per 10/14 los. No avs or calendar needed to be printed.

## 2020-03-26 NOTE — Progress Notes (Signed)
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  PRIOR THERAPY: 1) Right VATS with right upper lobectomy with bronchoplasty closure and en bloc resection of a wedge from the superior segment of the right lower lobe in addition to mediastinal lymph node dissection on 07/30/2015 under the care of Dr. Roxan Hockey. 2) Adjuvant systemic chemotherapy with cisplatin 75 MG/M2 and Alimta 500 MG/M2 every 3 weeks. First dose was given on 09/10/2015. Status post 4 cycles. 3) adjuvant radiotherapy under the care of Dr. Tammi Klippel.  CURRENT THERAPY: Tagrisso 80 mg p.o. daily started 12/01/2018.  Status post 16 months of treatment.  INTERVAL HISTORY: [name] West 69 y.o. female returns to the clinic today for follow-up visit accompanied by her husband.  The patient is feeling fine today with no concerning complaints except for occasional arthralgia and myalgia.  She denied having any chest pain, shortness of breath, cough or hemoptysis.  She denied having any recent weight loss or night sweats.  She has no nausea, vomiting, diarrhea or constipation.  She has no headache or visual changes.  She has more bloating these days.  The patient continues to tolerate her treatment with Tagrisso fairly well.  She is here today for evaluation and repeat CT scan of the chest, abdomen pelvis for restaging of her disease.   MEDICAL HISTORY: Past Medical History:  Diagnosis Date  . Anxiety   . Arthritis    shoulders  . Cancer ([place]) 06/2014   Lung Cancer  . Complication of anesthesia    nausea,  does well with PROPOFOL   . Cough 09/17/2015  . Encounter for antineoplastic chemotherapy 10/22/2015  . Environmental and seasonal allergies   . Family history of adverse reaction to anesthesia    Patients grandmother died while having hand surgery; pt unsure of cause  . Oral thrush 10/22/2015  . Ovarian cyst   . PONV (postoperative nausea and vomiting)   . Raynaud disease     ALLERGIES:  is allergic to avelox [moxifloxacin hcl in nacl], cephalosporins, clindamycin/lincomycin, penicillins, shrimp [shellfish allergy], macrobid [nitrofurantoin monohyd macro], omnipaque [iohexol], ciprofloxacin, adhesive [tape], doxycycline, lidoderm [lidocaine], pseudoephedrine, and sulfa antibiotics.  MEDICATIONS:  Current Outpatient Medications  Medication Sig Dispense Refill  . cholecalciferol (VITAMIN D) 1000 UNITS tablet Take 1,000 Units by mouth daily.    . citalopram (CELEXA) 10 MG tablet Take 10 mg by mouth daily.    . Clobetasol Prop Oint-Coal Tar (CLOBETAPLUS OINTMENT EX) Apply 1 Dose topically daily.    Marland Kitchen ibuprofen (ADVIL,MOTRIN) 200 MG tablet Take 200 mg by mouth every 6 (six) hours as needed (pain).    . LORazepam (ATIVAN) 0.5 MG tablet Take 0.5 mg by mouth at bedtime as needed for anxiety or sleep.     . Multiple Vitamin (MULTIVITAMIN WITH MINERALS) TABS Take 1 tablet by mouth daily.    . NONFORMULARY OR COMPOUNDED ITEM Place 1 application vaginally 2 (two) times a week. Estradiol 0.02% Cream    . predniSONE (DELTASONE) 50 MG tablet Take one tablet 13 hours , one tablet 7 hours and 1 tablet 1 hours prior to CT scan for CT dye allergy.  3 tablet 3  . Probiotic Product (ALIGN) 4 MG CAPS Take 1 capsule by mouth daily.    Marland Kitchen TAGRISSO 80 MG tablet TAKE 1 TABLET (80 MG TOTAL) BY MOUTH DAILY. 30 tablet 2   No current facility-administered medications for this visit.    SURGICAL HISTORY:  Past Surgical History:  Procedure Laterality Date  . BREAST EXCISIONAL BIOPSY Left    1996  . CESAREAN SECTION   1984  . COLONOSCOPY WITH PROPOFOL N/A 09/27/2012   Procedure: COLONOSCOPY WITH PROPOFOL;  Surgeon: Garlan Fair, MD;  Location: [facility];  Service: Endoscopy;  Laterality: N/A;  . Fibroid Removed    . LAPAROTOMY Left 07/20/2016   Procedure: MINI LAPAROTOMY, REMOVAL OF LEFT OVARIAN CYST, LEFT SALPINGO-OOPHORECTOMY;  Surgeon: Dian Queen, MD;  Location: [facility];  Service: Gynecology;  Laterality: Left;  please moved to 7:30am if spot opens  . Right Rotator Cuff Right   . TOTAL SHOULDER ARTHROPLASTY Left 06/14/2018   Procedure: TOTAL SHOULDER ARTHROPLASTY;  Surgeon: Justice Britain, MD;  Location: [facility];  Service: Orthopedics;  Laterality: Left;  126mn  . VIDEO ASSISTED THORACOSCOPY (VATS)/ LOBECTOMY Right 07/30/2015   Procedure: RIGHT VIDEO ASSISTED THORACOSCOPY (VATS)/RIGHT UPPER LOBECTOMY;  Surgeon: SMelrose Nakayama MD;  [facility]  Service: Thoracic;  Laterality: Right;  .Marland KitchenVIDEO BRONCHOSCOPY WITH ENDOBRONCHIAL NAVIGATION N/A 07/15/2015   Procedure: VIDEO BRONCHOSCOPY WITH ENDOBRONCHIAL NAVIGATION with Biopsy;  Surgeon: RCollene Gobble MD;  [facility]  Service: Thoracic;  Laterality: N/A;    REVIEW OF SYSTEMS:  Constitutional: negative Eyes: negative Ears, nose, mouth, throat, and face: negative Respiratory: negative Cardiovascular: negative Gastrointestinal: positive for Bloating Genitourinary:negative Integument/breast: negative Hematologic/lymphatic: negative Musculoskeletal:positive for arthralgias and myalgias Neurological: negative Behavioral/Psych: negative Endocrine: negative Allergic/Immunologic: negative   PHYSICAL EXAMINATION: General appearance: alert, cooperative and no distress Head: Normocephalic, without obvious abnormality, atraumatic Neck: no adenopathy, no JVD, supple, symmetrical, trachea midline and thyroid not enlarged, symmetric, no tenderness/mass/nodules Lymph nodes: Cervical, supraclavicular, and axillary nodes normal. Resp: clear to  auscultation bilaterally Back: symmetric, no curvature. ROM normal. No CVA tenderness. Cardio: regular rate and rhythm, S1, S2 normal, no murmur, click, rub or gallop GI: soft, non-tender; bowel sounds normal; no masses,  no organomegaly Extremities: extremities normal, atraumatic, no cyanosis or edema Neurologic: Alert and oriented X 3, normal strength and tone. Normal symmetric reflexes. Normal coordination and gait  ECOG PERFORMANCE STATUS: 0 - Asymptomatic  Blood pressure 118/67, pulse 86, temperature 97.8 F (36.6 C), resp. rate 12, height 4' 11"  (1.499 m), weight 91 lb 6.4 oz (41.5 kg), SpO2 99 %.   LABORATORY DATA: Lab Results  Component Value Date   WBC 7.3 03/24/2020   HGB 14.3 03/24/2020   HCT 43.9 03/24/2020   MCV 91.3 03/24/2020   PLT 228 03/24/2020      Chemistry      Component Value Date/Time   NA 136 03/24/2020 0947   NA 139 06/16/2017 1132   K 4.1 03/24/2020 0947   K 4.7 06/16/2017 1132   CL 100 03/24/2020 0947   CO2 25 03/24/2020 0947   CO2 28 06/16/2017 1132   BUN 21 03/24/2020 0947   BUN 19.5 06/16/2017 1132   CREATININE 0.96 03/24/2020 0947   CREATININE 0.8 06/16/2017 1132      Component Value Date/Time   CALCIUM 9.8 03/24/2020 0947   CALCIUM 10.0 06/16/2017 1132   ALKPHOS 82 03/24/2020 0947   ALKPHOS 68 06/16/2017 1132   AST 22 03/24/2020 0947   AST  21 06/16/2017 1132   ALT 23 03/24/2020 0947   ALT 18 06/16/2017 1132   BILITOT 0.3 03/24/2020 0947   BILITOT 0.50 06/16/2017 1132       RADIOGRAPHIC STUDIES: CT Chest W Contrast  Result Date: 03/24/2020 CLINICAL DATA:  Status post right upper lobectomy for non-small cell lung cancer in 2017 with adjuvant chemotherapy and radiation therapy and ongoing oral chemotherapy. Restaging. EXAM: CT CHEST, ABDOMEN, AND PELVIS WITH CONTRAST TECHNIQUE: Multidetector CT imaging of the chest, abdomen and pelvis was performed following the standard protocol during bolus administration of intravenous contrast.  CONTRAST:  50m OMNIPAQUE IOHEXOL 300 MG/ML  SOLN COMPARISON:  08/15/2019 CT chest, abdomen and pelvis. 11/12/2019 chest CT. FINDINGS: CT CHEST FINDINGS Cardiovascular: Normal heart size. No significant pericardial effusion/thickening. Left anterior descending coronary atherosclerosis. Great vessels are normal in course and caliber. No central pulmonary emboli. Mediastinum/Nodes: No discrete thyroid nodules. Unremarkable esophagus. No pathologically enlarged axillary, mediastinal or hilar lymph nodes. Lungs/Pleura: No pneumothorax. No pleural effusion. Status post right upper lobectomy. No acute consolidative airspace disease or lung masses. Several scattered small solid pulmonary nodules throughout both lungs measuring up to 5 mm in the posterior right lower lobe (series 6/image 78), all stable since 11/12/2019 chest CT. No new significant pulmonary nodules. Stable mild sharply marginated patchy consolidation and ground-glass opacity in medial upper right lung with associated mild volume loss and distortion, compatible with post treatment/postsurgical change. Musculoskeletal: No aggressive appearing focal osseous lesions. Partially visualized left shoulder arthroplasty. CT ABDOMEN PELVIS FINDINGS Hepatobiliary: Normal liver with no liver mass. Normal gallbladder with no radiopaque cholelithiasis. No biliary ductal dilatation. Pancreas: Normal, with no mass or duct dilation. Spleen: Normal size. No mass. Adrenals/Urinary Tract: Normal adrenals. Normal kidneys with no hydronephrosis and no renal mass. Normal bladder. Stomach/Bowel: Normal non-distended stomach. Normal caliber small bowel with no small bowel wall thickening. Appendix not discretely visualized. Diffuse moderate to large colonic stool volume. No large bowel wall thickening, diverticulosis or significant pericolonic fat stranding. Vascular/Lymphatic: Atherosclerotic nonaneurysmal abdominal aorta. Patent portal, splenic, hepatic and renal veins. No  pathologically enlarged lymph nodes in the abdomen or pelvis. Reproductive: Grossly normal uterus.  No adnexal mass. Other: No pneumoperitoneum, ascites or focal fluid collection. Musculoskeletal: No aggressive appearing focal osseous lesions. Mild lumbar spondylosis. IMPRESSION: 1. No evidence of local tumor recurrence status post right upper lobectomy. 2. Scattered small bilateral pulmonary nodules are stable. No new or progressive findings in the chest to suggest metastatic disease. 3. No findings of metastatic disease in the abdomen or pelvis. 4. Diffuse moderate to large colonic stool volume, suggesting constipation. 5. Aortic Atherosclerosis (ICD10-I70.0). Electronically Signed   By: JIlona SorrelM.D.   On: 03/24/2020 14:24   CT Abdomen Pelvis W Contrast  Result Date: 03/24/2020 CLINICAL DATA:  Status post right upper lobectomy for non-small cell lung cancer in 2017 with adjuvant chemotherapy and radiation therapy and ongoing oral chemotherapy. Restaging. EXAM: CT CHEST, ABDOMEN, AND PELVIS WITH CONTRAST TECHNIQUE: Multidetector CT imaging of the chest, abdomen and pelvis was performed following the standard protocol during bolus administration of intravenous contrast. CONTRAST:  749mOMNIPAQUE IOHEXOL 300 MG/ML  SOLN COMPARISON:  08/15/2019 CT chest, abdomen and pelvis. 11/12/2019 chest CT. FINDINGS: CT CHEST FINDINGS Cardiovascular: Normal heart size. No significant pericardial effusion/thickening. Left anterior descending coronary atherosclerosis. Great vessels are normal in course and caliber. No central pulmonary emboli. Mediastinum/Nodes: No discrete thyroid nodules. Unremarkable esophagus. No pathologically enlarged axillary, mediastinal or hilar lymph nodes. Lungs/Pleura: No pneumothorax. No pleural effusion.  Status post right upper lobectomy. No acute consolidative airspace disease or lung masses. Several scattered small solid pulmonary nodules throughout both lungs measuring up to 5 mm in the  posterior right lower lobe (series 6/image 78), all stable since 11/12/2019 chest CT. No new significant pulmonary nodules. Stable mild sharply marginated patchy consolidation and ground-glass opacity in medial upper right lung with associated mild volume loss and distortion, compatible with post treatment/postsurgical change. Musculoskeletal: No aggressive appearing focal osseous lesions. Partially visualized left shoulder arthroplasty. CT ABDOMEN PELVIS FINDINGS Hepatobiliary: Normal liver with no liver mass. Normal gallbladder with no radiopaque cholelithiasis. No biliary ductal dilatation. Pancreas: Normal, with no mass or duct dilation. Spleen: Normal size. No mass. Adrenals/Urinary Tract: Normal adrenals. Normal kidneys with no hydronephrosis and no renal mass. Normal bladder. Stomach/Bowel: Normal non-distended stomach. Normal caliber small bowel with no small bowel wall thickening. Appendix not discretely visualized. Diffuse moderate to large colonic stool volume. No large bowel wall thickening, diverticulosis or significant pericolonic fat stranding. Vascular/Lymphatic: Atherosclerotic nonaneurysmal abdominal aorta. Patent portal, splenic, hepatic and renal veins. No pathologically enlarged lymph nodes in the abdomen or pelvis. Reproductive: Grossly normal uterus.  No adnexal mass. Other: No pneumoperitoneum, ascites or focal fluid collection. Musculoskeletal: No aggressive appearing focal osseous lesions. Mild lumbar spondylosis. IMPRESSION: 1. No evidence of local tumor recurrence status post right upper lobectomy. 2. Scattered small bilateral pulmonary nodules are stable. No new or progressive findings in the chest to suggest metastatic disease. 3. No findings of metastatic disease in the abdomen or pelvis. 4. Diffuse moderate to large colonic stool volume, suggesting constipation. 5. Aortic Atherosclerosis (ICD10-I70.0). Electronically Signed   By: Ilona Sorrel M.D.   On: 03/24/2020 14:24   MM 3D  SCREEN BREAST BILATERAL  Result Date: 02/27/2020 CLINICAL DATA:  Screening. EXAM: DIGITAL SCREENING BILATERAL MAMMOGRAM WITH TOMO AND CAD COMPARISON:  Previous exam(s). ACR Breast Density Category d: The breast tissue is extremely dense, which lowers the sensitivity of mammography FINDINGS: There are no findings suspicious for malignancy. Images were processed with CAD. IMPRESSION: No mammographic evidence of malignancy. A result letter of this screening mammogram will be mailed directly to the patient. RECOMMENDATION: Screening mammogram in one year. (Code:[id number]) BI-RADS CATEGORY  1: Negative. Electronically Signed   By: Lajean Manes M.D.   On: 02/27/2020 10:46    ASSESSMENT AND PLAN:  This is a very pleasant 69 years old white female with stage IIIA non-small cell lung cancer, adenocarcinoma with positive EGFR mutation in exon 19 status post right upper lobectomy with lymph node dissection followed by adjuvant systemic chemotherapy as well as adjuvant radiation. The patient has been on observation for almost 3 years now.  She is feeling fine with no concerning complaints.   Unfortunately her scan in June 2020 showed multiple tiny pulmonary nodules scattered throughout both lungs which many of them have increased in size and some new nodule suspected the previous scan.  The patient started treatment with Tagrisso 80 mg p.o. daily status post 16 months of treatment.  The patient continues to tolerate this treatment fairly well with no concerning adverse effects except for mild arthralgia. She had repeat CT scan of the chest, abdomen pelvis performed recently.  I personally and independently reviewed the scans and discussed the results with the patient and her husband. Her scan showed no concerning findings for disease progression. I recommended for her to continue her current treatment with Tagrisso with the same dose. She will come back for follow-up visit in 2 months  for evaluation with repeat  blood work. The patient had several questions and answered them completely to her satisfactions. She was advised to call immediately if she has any concerning symptoms in the interval. The patient voices understanding of current disease status and treatment options and is in agreement with the current care plan. All questions were answered. The patient knows to call the clinic with any problems, questions or concerns. We can certainly see the patient much sooner if necessary.  Disclaimer: This note was dictated with voice recognition software. Similar sounding words can inadvertently be transcribed and may not be corrected upon review.

## 2020-04-08 DIAGNOSIS — M79671 Pain in right foot: Secondary | ICD-10-CM | POA: Diagnosis not present

## 2020-04-08 DIAGNOSIS — M25552 Pain in left hip: Secondary | ICD-10-CM | POA: Diagnosis not present

## 2020-04-08 DIAGNOSIS — L03031 Cellulitis of right toe: Secondary | ICD-10-CM | POA: Diagnosis not present

## 2020-04-08 DIAGNOSIS — I739 Peripheral vascular disease, unspecified: Secondary | ICD-10-CM | POA: Diagnosis not present

## 2020-04-08 DIAGNOSIS — M792 Neuralgia and neuritis, unspecified: Secondary | ICD-10-CM | POA: Diagnosis not present

## 2020-04-08 DIAGNOSIS — M25551 Pain in right hip: Secondary | ICD-10-CM | POA: Diagnosis not present

## 2020-04-10 DIAGNOSIS — M25552 Pain in left hip: Secondary | ICD-10-CM | POA: Diagnosis not present

## 2020-04-10 DIAGNOSIS — M25551 Pain in right hip: Secondary | ICD-10-CM | POA: Diagnosis not present

## 2020-04-14 DIAGNOSIS — M25551 Pain in right hip: Secondary | ICD-10-CM | POA: Diagnosis not present

## 2020-04-14 DIAGNOSIS — M25552 Pain in left hip: Secondary | ICD-10-CM | POA: Diagnosis not present

## 2020-04-16 ENCOUNTER — Encounter: Payer: Self-pay | Admitting: Internal Medicine

## 2020-04-17 DIAGNOSIS — M25552 Pain in left hip: Secondary | ICD-10-CM | POA: Diagnosis not present

## 2020-04-17 DIAGNOSIS — M25551 Pain in right hip: Secondary | ICD-10-CM | POA: Diagnosis not present

## 2020-04-21 MED FILL — TAGRISSO 80 MG TABLET: 80 | 30 days supply | Qty: 30 | Fill #2

## 2020-04-22 DIAGNOSIS — M25551 Pain in right hip: Secondary | ICD-10-CM | POA: Diagnosis not present

## 2020-04-22 DIAGNOSIS — M25552 Pain in left hip: Secondary | ICD-10-CM | POA: Diagnosis not present

## 2020-04-24 DIAGNOSIS — M25552 Pain in left hip: Secondary | ICD-10-CM | POA: Diagnosis not present

## 2020-04-24 DIAGNOSIS — M25551 Pain in right hip: Secondary | ICD-10-CM | POA: Diagnosis not present

## 2020-04-29 DIAGNOSIS — M25552 Pain in left hip: Secondary | ICD-10-CM | POA: Diagnosis not present

## 2020-04-29 DIAGNOSIS — M25551 Pain in right hip: Secondary | ICD-10-CM | POA: Diagnosis not present

## 2020-05-05 DIAGNOSIS — M25552 Pain in left hip: Secondary | ICD-10-CM | POA: Diagnosis not present

## 2020-05-05 DIAGNOSIS — M25551 Pain in right hip: Secondary | ICD-10-CM | POA: Diagnosis not present

## 2020-05-14 ENCOUNTER — Other Ambulatory Visit: Payer: Self-pay | Admitting: Physician Assistant

## 2020-05-14 DIAGNOSIS — C3491 Malignant neoplasm of unspecified part of right bronchus or lung: Secondary | ICD-10-CM

## 2020-05-14 DIAGNOSIS — Z20822 Contact with and (suspected) exposure to covid-19: Secondary | ICD-10-CM | POA: Diagnosis not present

## 2020-05-18 ENCOUNTER — Encounter: Payer: Self-pay | Admitting: Internal Medicine

## 2020-05-18 DIAGNOSIS — I2510 Atherosclerotic heart disease of native coronary artery without angina pectoris: Secondary | ICD-10-CM | POA: Diagnosis not present

## 2020-05-18 DIAGNOSIS — M810 Age-related osteoporosis without current pathological fracture: Secondary | ICD-10-CM | POA: Diagnosis not present

## 2020-05-18 DIAGNOSIS — C3491 Malignant neoplasm of unspecified part of right bronchus or lung: Secondary | ICD-10-CM | POA: Diagnosis not present

## 2020-05-18 DIAGNOSIS — M8580 Other specified disorders of bone density and structure, unspecified site: Secondary | ICD-10-CM | POA: Diagnosis not present

## 2020-05-20 MED FILL — TAGRISSO 80 MG TABLET: 80 | 30 days supply | Qty: 30 | Fill #0

## 2020-05-25 DIAGNOSIS — J310 Chronic rhinitis: Secondary | ICD-10-CM | POA: Diagnosis not present

## 2020-05-25 DIAGNOSIS — L301 Dyshidrosis [pompholyx]: Secondary | ICD-10-CM | POA: Diagnosis not present

## 2020-05-25 DIAGNOSIS — M810 Age-related osteoporosis without current pathological fracture: Secondary | ICD-10-CM | POA: Diagnosis not present

## 2020-05-25 DIAGNOSIS — I700 Atherosclerosis of aorta: Secondary | ICD-10-CM | POA: Diagnosis not present

## 2020-05-25 DIAGNOSIS — Z01419 Encounter for gynecological examination (general) (routine) without abnormal findings: Secondary | ICD-10-CM | POA: Diagnosis not present

## 2020-05-25 DIAGNOSIS — K5900 Constipation, unspecified: Secondary | ICD-10-CM | POA: Diagnosis not present

## 2020-05-25 DIAGNOSIS — F411 Generalized anxiety disorder: Secondary | ICD-10-CM | POA: Diagnosis not present

## 2020-05-25 DIAGNOSIS — C3491 Malignant neoplasm of unspecified part of right bronchus or lung: Secondary | ICD-10-CM | POA: Diagnosis not present

## 2020-05-25 DIAGNOSIS — J069 Acute upper respiratory infection, unspecified: Secondary | ICD-10-CM | POA: Diagnosis not present

## 2020-05-25 DIAGNOSIS — N952 Postmenopausal atrophic vaginitis: Secondary | ICD-10-CM | POA: Diagnosis not present

## 2020-05-25 DIAGNOSIS — M25551 Pain in right hip: Secondary | ICD-10-CM | POA: Diagnosis not present

## 2020-05-25 DIAGNOSIS — Z0000 Encounter for general adult medical examination without abnormal findings: Secondary | ICD-10-CM | POA: Diagnosis not present

## 2020-05-26 ENCOUNTER — Encounter: Payer: Self-pay | Admitting: Internal Medicine

## 2020-05-26 ENCOUNTER — Inpatient Hospital Stay: Payer: Medicare Other | Attending: Internal Medicine | Admitting: Internal Medicine

## 2020-05-26 ENCOUNTER — Inpatient Hospital Stay: Payer: Medicare Other

## 2020-05-26 ENCOUNTER — Other Ambulatory Visit: Payer: Self-pay

## 2020-05-26 VITALS — BP 110/50 | HR 87 | Temp 97.8°F | Resp 18 | Ht 59.0 in | Wt 90.9 lb

## 2020-05-26 DIAGNOSIS — Z5111 Encounter for antineoplastic chemotherapy: Secondary | ICD-10-CM

## 2020-05-26 DIAGNOSIS — I2584 Coronary atherosclerosis due to calcified coronary lesion: Secondary | ICD-10-CM

## 2020-05-26 DIAGNOSIS — Z91041 Radiographic dye allergy status: Secondary | ICD-10-CM

## 2020-05-26 DIAGNOSIS — C3491 Malignant neoplasm of unspecified part of right bronchus or lung: Secondary | ICD-10-CM | POA: Diagnosis not present

## 2020-05-26 DIAGNOSIS — C3490 Malignant neoplasm of unspecified part of unspecified bronchus or lung: Secondary | ICD-10-CM | POA: Diagnosis not present

## 2020-05-26 DIAGNOSIS — Z923 Personal history of irradiation: Secondary | ICD-10-CM | POA: Insufficient documentation

## 2020-05-26 DIAGNOSIS — C3411 Malignant neoplasm of upper lobe, right bronchus or lung: Secondary | ICD-10-CM | POA: Diagnosis not present

## 2020-05-26 DIAGNOSIS — I2510 Atherosclerotic heart disease of native coronary artery without angina pectoris: Secondary | ICD-10-CM

## 2020-05-26 DIAGNOSIS — Z9221 Personal history of antineoplastic chemotherapy: Secondary | ICD-10-CM | POA: Diagnosis not present

## 2020-05-26 LAB — CBC WITH DIFFERENTIAL (CANCER CENTER ONLY)
Abs Immature Granulocytes: 0.01 10*3/uL (ref 0.00–0.07)
Basophils Absolute: 0 10*3/uL (ref 0.0–0.1)
Basophils Relative: 0 %
Eosinophils Absolute: 0.1 10*3/uL (ref 0.0–0.5)
Eosinophils Relative: 1 %
HCT: 42.2 % (ref 36.0–46.0)
Hemoglobin: 13.7 g/dL (ref 12.0–15.0)
Immature Granulocytes: 0 %
Lymphocytes Relative: 13 %
Lymphs Abs: 0.7 10*3/uL (ref 0.7–4.0)
MCH: 30 pg (ref 26.0–34.0)
MCHC: 32.5 g/dL (ref 30.0–36.0)
MCV: 92.3 fL (ref 80.0–100.0)
Monocytes Absolute: 1 10*3/uL (ref 0.1–1.0)
Monocytes Relative: 16 %
Neutro Abs: 4.1 10*3/uL (ref 1.7–7.7)
Neutrophils Relative %: 70 %
Platelet Count: 218 10*3/uL (ref 150–400)
RBC: 4.57 MIL/uL (ref 3.87–5.11)
RDW: 13.1 % (ref 11.5–15.5)
WBC Count: 5.9 10*3/uL (ref 4.0–10.5)
nRBC: 0 % (ref 0.0–0.2)

## 2020-05-26 LAB — CMP (CANCER CENTER ONLY)
ALT: 18 U/L (ref 0–44)
AST: 21 U/L (ref 15–41)
Albumin: 3.8 g/dL (ref 3.5–5.0)
Alkaline Phosphatase: 71 U/L (ref 38–126)
Anion gap: 7 (ref 5–15)
BUN: 20 mg/dL (ref 8–23)
CO2: 31 mmol/L (ref 22–32)
Calcium: 9.7 mg/dL (ref 8.9–10.3)
Chloride: 103 mmol/L (ref 98–111)
Creatinine: 0.99 mg/dL (ref 0.44–1.00)
GFR, Estimated: >60 mL/min (ref >60)
Glucose, Bld: 67 mg/dL — ABNORMAL LOW (ref 70–99)
Potassium: 4.5 mmol/L (ref 3.5–5.1)
Sodium: 141 mmol/L (ref 135–145)
Total Bilirubin: 0.5 mg/dL (ref 0.3–1.2)
Total Protein: 6.8 g/dL (ref 6.5–8.1)

## 2020-05-26 MED ORDER — PREDNISONE 50 MG PO TABS: ORAL_TABLET | ORAL | 3 refills | Status: DC

## 2020-05-26 NOTE — Progress Notes (Signed)
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  PRIOR THERAPY: 1) Right VATS with right upper lobectomy with bronchoplasty closure and en bloc resection of a wedge from the superior segment of the right lower lobe in addition to mediastinal lymph node dissection on 07/30/2015 under the care of Dr. Roxan Hockey. 2) Adjuvant systemic chemotherapy with cisplatin 75 MG/M2 and Alimta 500 MG/M2 every 3 weeks. First dose was given on 09/10/2015. Status post 4 cycles. 3) adjuvant radiotherapy under the care of Dr. Tammi Klippel.  CURRENT THERAPY: Tagrisso 80 mg p.o. daily started 12/01/2018.  Status post 18 months of treatment.  INTERVAL HISTORY: [name] West 69 y.o. female returns to the clinic today for follow-up visit.  The patient is feeling fine today with no concerning complaints except for cracks in her fingers and she is currently using vitamin E creams.  She denied having any current chest pain, shortness of breath, cough or hemoptysis.  She denied having any nausea, vomiting, diarrhea or constipation.  She has no headache or visual changes.  She continues to tolerate her treatment well.  She is here today for evaluation and repeat blood work.   MEDICAL HISTORY: Past Medical History:  Diagnosis Date  . Anxiety   . Arthritis    shoulders  . Cancer ([facility]) 06/2014   Lung Cancer  . Complication of anesthesia    nausea, does well with PROPOFOL   . Cough 09/17/2015  . Encounter for antineoplastic chemotherapy 10/22/2015  . Environmental and seasonal allergies   . Family  history of adverse reaction to anesthesia    Patients grandmother died while having hand surgery; pt unsure of cause  . Oral thrush 10/22/2015  . Ovarian cyst   . PONV (postoperative nausea and vomiting)   . Raynaud disease     ALLERGIES:  is allergic to avelox [moxifloxacin hcl in nacl], cephalexin, cephalosporins, ciprofloxacin hcl, clindamycin/lincomycin, penicillins, shrimp [shellfish allergy], macrobid [nitrofurantoin monohyd macro], omnipaque [iohexol], azithromycin, fluconazole, adhesive [tape], lidoderm [lidocaine], pseudoephedrine, and sulfa antibiotics.  MEDICATIONS:  Current Outpatient Medications  Medication Sig Dispense Refill  . levocetirizine (XYZAL ALLERGY 24HR) 5 MG tablet 1 tablet in the evening    . Azelastine HCl 0.15 % SOLN Place into both nostrils.    . cholecalciferol (VITAMIN D) 1000 UNITS tablet Take 1,000 Units by mouth daily.    . citalopram (CELEXA) 10 MG tablet Take 10 mg by mouth daily.    . Clobetasol Prop Oint-Coal Tar (CLOBETAPLUS OINTMENT EX) Apply 1 Dose topically daily.    Marland Kitchen EPINEPHrine 0.3 mg/0.3 mL IJ SOAJ injection Inject into the muscle as directed.    Marland Kitchen ibuprofen (ADVIL,MOTRIN) 200 MG tablet Take 200 mg by mouth every 6 (six) hours as needed (pain).    . LORazepam (ATIVAN) 0.5 MG tablet Take 0.5 mg by mouth at bedtime as needed for anxiety or sleep.     . Multiple Vitamin (MULTIVITAMIN WITH MINERALS) TABS Take 1 tablet by mouth daily.    . NONFORMULARY OR COMPOUNDED ITEM Place 1 application vaginally 2 (two) times a week. Estradiol 0.02% Cream    . predniSONE (DELTASONE) 50 MG tablet  Take one tablet 13 hours , one tablet 7 hours and 1 tablet 1 hours prior to CT scan for CT dye allergy. 3 tablet 3  . Probiotic Product (ALIGN) 4 MG CAPS Take 1 capsule by mouth daily.    Marland Kitchen TAGRISSO 80 MG tablet TAKE 1 TABLET (80 MG TOTAL) BY MOUTH DAILY. 30 tablet 2  . Wheat Dextrin (BENEFIBER) POWD See admin instructions.     No current facility-administered  medications for this visit.    SURGICAL HISTORY:  Past Surgical History:  Procedure Laterality Date  . BREAST EXCISIONAL BIOPSY Left    1996  . CESAREAN SECTION  1984  . COLONOSCOPY WITH PROPOFOL N/A 09/27/2012   Procedure: COLONOSCOPY WITH PROPOFOL;  Surgeon: Garlan Fair, MD;  Location: [facility];  Service: Endoscopy;  Laterality: N/A;  . Fibroid Removed    . LAPAROTOMY Left 07/20/2016   Procedure: MINI LAPAROTOMY, REMOVAL OF LEFT OVARIAN CYST, LEFT SALPINGO-OOPHORECTOMY;  Surgeon: Dian Queen, MD;  Location: [facility];  Service: Gynecology;  Laterality: Left;  please moved to 7:30am if spot opens  . Right Rotator Cuff Right   . TOTAL SHOULDER ARTHROPLASTY Left 06/14/2018   Procedure: TOTAL SHOULDER ARTHROPLASTY;  Surgeon: Justice Britain, MD;  Location: [facility];  Service: Orthopedics;  Laterality: Left;  132mn  . VIDEO ASSISTED THORACOSCOPY (VATS)/ LOBECTOMY Right 07/30/2015   Procedure: RIGHT VIDEO ASSISTED THORACOSCOPY (VATS)/RIGHT UPPER LOBECTOMY;  Surgeon: SMelrose Nakayama MD;  Location: [facility]  Service: Thoracic;  Laterality: Right;  .Marland KitchenVIDEO BRONCHOSCOPY WITH ENDOBRONCHIAL NAVIGATION N/A 07/15/2015   Procedure: VIDEO BRONCHOSCOPY WITH ENDOBRONCHIAL NAVIGATION with Biopsy;  Surgeon: RCollene Gobble MD;  Location: [facility];  Service: Thoracic;  Laterality: N/A;    REVIEW OF SYSTEMS:  A comprehensive review of systems was negative except for: Integument/breast: positive for dryness and skin lesion(s)   PHYSICAL EXAMINATION: General appearance: alert, cooperative and no distress Head: Normocephalic, without obvious abnormality, atraumatic Neck: no adenopathy, no JVD, supple, symmetrical, trachea midline and thyroid not enlarged, symmetric, no tenderness/mass/nodules Lymph nodes: Cervical, supraclavicular, and axillary nodes normal. Resp: clear to auscultation bilaterally Back: symmetric, no curvature. ROM normal. No CVA tenderness. Cardio: regular rate and rhythm, S1, S2 normal, no  murmur, click, rub or gallop GI: soft, non-tender; bowel sounds normal; no masses,  no organomegaly Extremities: extremities normal, atraumatic, no cyanosis or edema  ECOG PERFORMANCE STATUS: 0 - Asymptomatic  Blood pressure (!) 110/50, pulse 87, temperature 97.8 F (36.6 C), temperature source Tympanic, resp. rate 18, height 4' 11"  (1.499 m), weight 90 lb 14.4 oz (41.2 kg), SpO2 100 %.   LABORATORY DATA: Lab Results  Component Value Date   WBC 5.9 05/26/2020   HGB 13.7 05/26/2020   HCT 42.2 05/26/2020   MCV 92.3 05/26/2020   PLT 218 05/26/2020      Chemistry      Component Value Date/Time   NA 141 05/26/2020 0820   NA 139 06/16/2017 1132   K 4.5 05/26/2020 0820   K 4.7 06/16/2017 1132   CL 103 05/26/2020 0820   CO2 31 05/26/2020 0820   CO2 28 06/16/2017 1132   BUN 20 05/26/2020 0820   BUN 19.5 06/16/2017 1132   CREATININE 0.99 05/26/2020 0820   CREATININE 0.8 06/16/2017 1132      Component Value Date/Time   CALCIUM 9.7 05/26/2020 0820   CALCIUM 10.0 06/16/2017 1132   ALKPHOS 71 05/26/2020 0820   ALKPHOS 68 06/16/2017 1132   AST 21 05/26/2020 0820   AST  21 06/16/2017 1132   ALT 18 05/26/2020 0820   ALT 18 06/16/2017 1132   BILITOT 0.5 05/26/2020 0820   BILITOT 0.50 06/16/2017 1132       RADIOGRAPHIC STUDIES: No results found.  ASSESSMENT AND PLAN:  This is a very pleasant 69 years old white female with stage IIIA non-small cell lung cancer, adenocarcinoma with positive EGFR mutation in exon 19 status post right upper lobectomy with lymph node dissection followed by adjuvant systemic chemotherapy as well as adjuvant radiation. The patient has been on observation for almost 3 years now.  She is feeling fine with no concerning complaints.   Unfortunately her scan in June 2020 showed multiple tiny pulmonary nodules scattered throughout both lungs which many of them have increased in size and some new nodule suspected the previous scan.  The patient started  treatment with Tagrisso 80 mg p.o. daily status post 18 months of treatment.   The patient continues to tolerate her treatment well with no concerning adverse effect except for the skin cracks close to the nails.  She is applying vitamin E creams with some improvement. I recommended for her to continue her current treatment with Tagrisso with the same dose. I will see her back for follow-up visit in 2 months for evaluation with repeat CT scan of the chest, abdomen pelvis for restaging of her disease. The patient was advised to call immediately if she has any concerning symptoms in the interval. The patient voices understanding of current disease status and treatment options and is in agreement with the current care plan. All questions were answered. The patient knows to call the clinic with any problems, questions or concerns. We can certainly see the patient much sooner if necessary.  Disclaimer: This note was dictated with voice recognition software. Similar sounding words can inadvertently be transcribed and may not be corrected upon review.

## 2020-05-27 DIAGNOSIS — Z1211 Encounter for screening for malignant neoplasm of colon: Secondary | ICD-10-CM | POA: Diagnosis not present

## 2020-06-01 ENCOUNTER — Encounter: Payer: Self-pay | Admitting: Internal Medicine

## 2020-06-18 MED FILL — TAGRISSO 80 MG TABLET: 80 | 30 days supply | Qty: 30 | Fill #1

## 2020-06-22 ENCOUNTER — Other Ambulatory Visit: Payer: Medicare Other

## 2020-07-01 DIAGNOSIS — Z23 Encounter for immunization: Secondary | ICD-10-CM | POA: Diagnosis not present

## 2020-07-16 MED FILL — TAGRISSO 80 MG TABLET: 80 | 30 days supply | Qty: 30 | Fill #2

## 2020-07-21 DIAGNOSIS — Z880 Allergy status to penicillin: Secondary | ICD-10-CM | POA: Diagnosis not present

## 2020-07-21 DIAGNOSIS — J301 Allergic rhinitis due to pollen: Secondary | ICD-10-CM | POA: Diagnosis not present

## 2020-07-21 DIAGNOSIS — Z91030 Bee allergy status: Secondary | ICD-10-CM | POA: Diagnosis not present

## 2020-07-21 DIAGNOSIS — J3089 Other allergic rhinitis: Secondary | ICD-10-CM | POA: Diagnosis not present

## 2020-07-27 ENCOUNTER — Encounter (HOSPITAL_COMMUNITY): Payer: Self-pay

## 2020-07-27 ENCOUNTER — Inpatient Hospital Stay: Payer: Medicare Other | Attending: Internal Medicine

## 2020-07-27 ENCOUNTER — Other Ambulatory Visit: Payer: Self-pay

## 2020-07-27 ENCOUNTER — Ambulatory Visit (HOSPITAL_COMMUNITY)
Admission: RE | Admit: 2020-07-27 | Discharge: 2020-07-27 | Disposition: A | Discharge status: Home / Self Care | Payer: Medicare Other | Source: Ambulatory Visit | Attending: Internal Medicine | Admitting: Internal Medicine

## 2020-07-27 DIAGNOSIS — C3490 Malignant neoplasm of unspecified part of unspecified bronchus or lung: Secondary | ICD-10-CM | POA: Insufficient documentation

## 2020-07-27 DIAGNOSIS — Z923 Personal history of irradiation: Secondary | ICD-10-CM | POA: Insufficient documentation

## 2020-07-27 DIAGNOSIS — I700 Atherosclerosis of aorta: Secondary | ICD-10-CM | POA: Diagnosis not present

## 2020-07-27 DIAGNOSIS — M47814 Spondylosis without myelopathy or radiculopathy, thoracic region: Secondary | ICD-10-CM | POA: Diagnosis not present

## 2020-07-27 DIAGNOSIS — Z9221 Personal history of antineoplastic chemotherapy: Secondary | ICD-10-CM | POA: Insufficient documentation

## 2020-07-27 DIAGNOSIS — C3411 Malignant neoplasm of upper lobe, right bronchus or lung: Secondary | ICD-10-CM | POA: Diagnosis not present

## 2020-07-27 DIAGNOSIS — M47816 Spondylosis without myelopathy or radiculopathy, lumbar region: Secondary | ICD-10-CM | POA: Diagnosis not present

## 2020-07-27 DIAGNOSIS — I2510 Atherosclerotic heart disease of native coronary artery without angina pectoris: Secondary | ICD-10-CM | POA: Diagnosis not present

## 2020-07-27 DIAGNOSIS — R918 Other nonspecific abnormal finding of lung field: Secondary | ICD-10-CM | POA: Insufficient documentation

## 2020-07-27 LAB — CMP (CANCER CENTER ONLY)
ALT: 19 U/L (ref 0–44)
AST: 26 U/L (ref 15–41)
Albumin: 4.5 g/dL (ref 3.5–5.0)
Alkaline Phosphatase: 67 U/L (ref 38–126)
Anion gap: 12 (ref 5–15)
BUN: 24 mg/dL — ABNORMAL HIGH (ref 8–23)
CO2: 24 mmol/L (ref 22–32)
Calcium: 9.7 mg/dL (ref 8.9–10.3)
Chloride: 102 mmol/L (ref 98–111)
Creatinine: 1.1 mg/dL — ABNORMAL HIGH (ref 0.44–1.00)
GFR, Estimated: 54 mL/min — ABNORMAL LOW (ref >60)
Glucose, Bld: 187 mg/dL — ABNORMAL HIGH (ref 70–99)
Potassium: 4.7 mmol/L (ref 3.5–5.1)
Sodium: 138 mmol/L (ref 135–145)
Total Bilirubin: 0.4 mg/dL (ref 0.3–1.2)
Total Protein: 7.5 g/dL (ref 6.5–8.1)

## 2020-07-27 LAB — CBC WITH DIFFERENTIAL (CANCER CENTER ONLY)
Abs Immature Granulocytes: 0.02 10*3/uL (ref 0.00–0.07)
Basophils Absolute: 0 10*3/uL (ref 0.0–0.1)
Basophils Relative: 0 %
Eosinophils Absolute: 0 10*3/uL (ref 0.0–0.5)
Eosinophils Relative: 0 %
HCT: 44.5 % (ref 36.0–46.0)
Hemoglobin: 14.9 g/dL (ref 12.0–15.0)
Immature Granulocytes: 0 %
Lymphocytes Relative: 8 %
Lymphs Abs: 0.6 10*3/uL — ABNORMAL LOW (ref 0.7–4.0)
MCH: 30.9 pg (ref 26.0–34.0)
MCHC: 33.5 g/dL (ref 30.0–36.0)
MCV: 92.3 fL (ref 80.0–100.0)
Monocytes Absolute: 0.2 10*3/uL (ref 0.1–1.0)
Monocytes Relative: 2 %
Neutro Abs: 6.6 10*3/uL (ref 1.7–7.7)
Neutrophils Relative %: 90 %
Platelet Count: 207 10*3/uL (ref 150–400)
RBC: 4.82 MIL/uL (ref 3.87–5.11)
RDW: 12.9 % (ref 11.5–15.5)
WBC Count: 7.3 10*3/uL (ref 4.0–10.5)
nRBC: 0 % (ref 0.0–0.2)

## 2020-07-27 MED ORDER — IOHEXOL 300 MG/ML  SOLN
100.0000 mL | Freq: Once | INTRAMUSCULAR | Status: AC | PRN
  Administered 2020-07-27: 80 mL via INTRAVENOUS

## 2020-07-28 ENCOUNTER — Telehealth: Payer: Self-pay | Admitting: Internal Medicine

## 2020-07-28 ENCOUNTER — Other Ambulatory Visit: Payer: Self-pay

## 2020-07-28 ENCOUNTER — Inpatient Hospital Stay (HOSPITAL_BASED_OUTPATIENT_CLINIC_OR_DEPARTMENT_OTHER): Payer: Medicare Other | Admitting: Internal Medicine

## 2020-07-28 VITALS — BP 119/67 | HR 92 | Temp 97.8°F | Resp 13 | Ht 59.0 in | Wt 91.9 lb

## 2020-07-28 DIAGNOSIS — C3491 Malignant neoplasm of unspecified part of right bronchus or lung: Secondary | ICD-10-CM | POA: Diagnosis not present

## 2020-07-28 DIAGNOSIS — Z9221 Personal history of antineoplastic chemotherapy: Secondary | ICD-10-CM | POA: Diagnosis not present

## 2020-07-28 DIAGNOSIS — R918 Other nonspecific abnormal finding of lung field: Secondary | ICD-10-CM | POA: Diagnosis not present

## 2020-07-28 DIAGNOSIS — Z5111 Encounter for antineoplastic chemotherapy: Secondary | ICD-10-CM | POA: Diagnosis not present

## 2020-07-28 DIAGNOSIS — C3411 Malignant neoplasm of upper lobe, right bronchus or lung: Secondary | ICD-10-CM | POA: Diagnosis not present

## 2020-07-28 DIAGNOSIS — Z923 Personal history of irradiation: Secondary | ICD-10-CM | POA: Diagnosis not present

## 2020-07-28 NOTE — Telephone Encounter (Signed)
Scheduled follow-up appointment per 2/15 los. Patient is aware.

## 2020-07-28 NOTE — Progress Notes (Signed)
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  PRIOR THERAPY: 1) Right VATS with right upper lobectomy with bronchoplasty closure and en bloc resection of a wedge from the superior segment of the right lower lobe in addition to mediastinal lymph node dissection on 07/30/2015 under the care of Dr. Roxan Hockey. 2) Adjuvant systemic chemotherapy with cisplatin 75 MG/M2 and Alimta 500 MG/M2 every 3 weeks. First dose was given on 09/10/2015. Status post 4 cycles. 3) adjuvant radiotherapy under the care of Dr. Tammi Klippel.  CURRENT THERAPY: Tagrisso 80 mg p.o. daily started 12/01/2018.  Status post 20 months of treatment.  INTERVAL HISTORY: [name] West 70 y.o. female returns to the clinic today for follow-up visit accompanied by her husband.  The patient is feeling fine today with no concerning complaints except for occasional referred pain from the neck area to the head.  She denied having any current chest pain, shortness of breath, cough or hemoptysis.  She denied having any fever or chills.  She has no nausea, vomiting, diarrhea or constipation.  She has no headache or visual changes.  She has no skin rash.  She continues to tolerate her treatment with Tagrisso fairly well.  The patient had repeat CT scan of the chest, abdomen pelvis performed recently and she is here for evaluation and discussion of her risk her results.  MEDICAL HISTORY: Past Medical History:  Diagnosis Date  . Anxiety   . Arthritis    shoulders  . Cancer ([facility]) 06/2014   Lung Cancer  . Complication  of anesthesia    nausea, does well with PROPOFOL   . Cough 09/17/2015  . Encounter for antineoplastic chemotherapy 10/22/2015  . Environmental and seasonal allergies   . Family history of adverse reaction to anesthesia    Patients grandmother died while having hand surgery; pt unsure of cause  . Oral thrush 10/22/2015  . Ovarian cyst   . PONV (postoperative nausea and vomiting)   . Raynaud disease     ALLERGIES:  is allergic to avelox [moxifloxacin hcl in nacl], cephalexin, cephalosporins, ciprofloxacin hcl, clindamycin/lincomycin, penicillins, shrimp [shellfish allergy], macrobid [nitrofurantoin monohyd macro], omnipaque [iohexol], azithromycin, fluconazole, adhesive [tape], lidoderm [lidocaine], pseudoephedrine, and sulfa antibiotics.  MEDICATIONS:  Current Outpatient Medications  Medication Sig Dispense Refill  . cholecalciferol (VITAMIN D) 1000 UNITS tablet Take 1,000 Units by mouth daily.    . citalopram (CELEXA) 10 MG tablet Take 10 mg by mouth daily.    . Clobetasol Prop Oint-Coal Tar (CLOBETAPLUS OINTMENT EX) Apply 1 Dose topically daily.    Marland Kitchen EPINEPHrine 0.3 mg/0.3 mL IJ SOAJ injection Inject into the muscle as directed.    Marland Kitchen ibuprofen (ADVIL,MOTRIN) 200 MG tablet Take 200 mg by mouth every 6 (six) hours as needed (pain).    Marland Kitchen levocetirizine (XYZAL ALLERGY 24HR) 5 MG tablet 1 tablet in the evening    . LORazepam (ATIVAN) 0.5 MG tablet Take 0.5 mg by mouth at bedtime as needed for anxiety or sleep.     . Multiple Vitamin (MULTIVITAMIN WITH MINERALS) TABS Take 1 tablet by mouth daily.    . NONFORMULARY OR COMPOUNDED ITEM Place  1 application vaginally 2 (two) times a week. Estradiol 0.02% Cream    . predniSONE (DELTASONE) 50 MG tablet Take one tablet 13 hours , one tablet 7 hours and 1 tablet 1 hours prior to CT scan for CT dye allergy. 3 tablet 3  . Probiotic Product (ALIGN) 4 MG CAPS Take 1 capsule by mouth daily.    Marland Kitchen TAGRISSO 80 MG tablet TAKE 1 TABLET (80 MG TOTAL) BY MOUTH DAILY.  30 tablet 2  . Wheat Dextrin (BENEFIBER) POWD See admin instructions.     No current facility-administered medications for this visit.    SURGICAL HISTORY:  Past Surgical History:  Procedure Laterality Date  . BREAST EXCISIONAL BIOPSY Left    1996  . CESAREAN SECTION  1984  . COLONOSCOPY WITH PROPOFOL N/A 09/27/2012   Procedure: COLONOSCOPY WITH PROPOFOL;  Surgeon: Garlan Fair, MD;  Location: [facility];  Service: Endoscopy;  Laterality: N/A;  . Fibroid Removed    . LAPAROTOMY Left 07/20/2016   Procedure: MINI LAPAROTOMY, REMOVAL OF LEFT OVARIAN CYST, LEFT SALPINGO-OOPHORECTOMY;  Surgeon: Dian Queen, MD;  Location: [facility];  Service: Gynecology;  Laterality: Left;  please moved to 7:30am if spot opens  . Right Rotator Cuff Right   . TOTAL SHOULDER ARTHROPLASTY Left 06/14/2018   Procedure: TOTAL SHOULDER ARTHROPLASTY;  Surgeon: Justice Britain, MD;  Location: [facility];  Service: Orthopedics;  Laterality: Left;  169min  . VIDEO ASSISTED THORACOSCOPY (VATS)/ LOBECTOMY Right 07/30/2015   Procedure: RIGHT VIDEO ASSISTED THORACOSCOPY (VATS)/RIGHT UPPER LOBECTOMY;  Surgeon: Melrose Nakayama, MD;  Location: [facility];  Service: Thoracic;  Laterality: Right;  Marland Kitchen VIDEO BRONCHOSCOPY WITH ENDOBRONCHIAL NAVIGATION N/A 07/15/2015   Procedure: VIDEO BRONCHOSCOPY WITH ENDOBRONCHIAL NAVIGATION with Biopsy;  Surgeon: Collene Gobble, MD;  Location: [facility];  Service: Thoracic;  Laterality: N/A;    REVIEW OF SYSTEMS:  Constitutional: negative Eyes: negative Ears, nose, mouth, throat, and face: negative Respiratory: negative Cardiovascular: negative Gastrointestinal: negative Genitourinary:negative Integument/breast: negative Hematologic/lymphatic: negative Musculoskeletal:negative Neurological: negative Behavioral/Psych: negative Endocrine: negative Allergic/Immunologic: negative   PHYSICAL EXAMINATION: General appearance: alert, cooperative and no distress Head: Normocephalic, without obvious  abnormality, atraumatic Neck: no adenopathy, no JVD, supple, symmetrical, trachea midline and thyroid not enlarged, symmetric, no tenderness/mass/nodules Lymph nodes: Cervical, supraclavicular, and axillary nodes normal. Resp: clear to auscultation bilaterally Back: symmetric, no curvature. ROM normal. No CVA tenderness. Cardio: regular rate and rhythm, S1, S2 normal, no murmur, click, rub or gallop GI: soft, non-tender; bowel sounds normal; no masses,  no organomegaly Extremities: extremities normal, atraumatic, no cyanosis or edema Neurologic: Alert and oriented X 3, normal strength and tone. Normal symmetric reflexes. Normal coordination and gait  ECOG PERFORMANCE STATUS: 0 - Asymptomatic  Blood pressure 119/67, pulse 92, temperature 97.8 F (36.6 C), temperature source Tympanic, resp. rate 13, height $RemoveBe'4\' 11"'GiLRiWEbP$  (1.499 m), weight 91 lb 14.4 oz (41.7 kg), SpO2 99 %.   LABORATORY DATA: Lab Results  Component Value Date   WBC 7.3 07/27/2020   HGB 14.9 07/27/2020   HCT 44.5 07/27/2020   MCV 92.3 07/27/2020   PLT 207 07/27/2020      Chemistry      Component Value Date/Time   NA 138 07/27/2020 1031   NA 139 06/16/2017 1132   K 4.7 07/27/2020 1031   K 4.7 06/16/2017 1132   CL 102 07/27/2020 1031   CO2 24 07/27/2020 1031   CO2 28 06/16/2017 1132   BUN 24 (H) 07/27/2020 1031   BUN 19.5 06/16/2017 1132  CREATININE 1.10 (H) 07/27/2020 1031   CREATININE 0.8 06/16/2017 1132      Component Value Date/Time   CALCIUM 9.7 07/27/2020 1031   CALCIUM 10.0 06/16/2017 1132   ALKPHOS 67 07/27/2020 1031   ALKPHOS 68 06/16/2017 1132   AST 26 07/27/2020 1031   AST 21 06/16/2017 1132   ALT 19 07/27/2020 1031   ALT 18 06/16/2017 1132   BILITOT 0.4 07/27/2020 1031   BILITOT 0.50 06/16/2017 1132       RADIOGRAPHIC STUDIES: CT Chest W Contrast  Result Date: 07/27/2020 CLINICAL DATA:  History of right upper lobectomy for non-small cell lung cancer in 2017, adjuvant chemotherapy and  radiation therapy and ongoing oral chemotherapy. Restaging. EXAM: CT CHEST, ABDOMEN, AND PELVIS WITH CONTRAST TECHNIQUE: Multidetector CT imaging of the chest, abdomen and pelvis was performed following the standard protocol during bolus administration of intravenous contrast. CONTRAST:  15mL OMNIPAQUE IOHEXOL 300 MG/ML  SOLN COMPARISON:  03/24/2020 CT chest, abdomen and pelvis. FINDINGS: CT CHEST FINDINGS Cardiovascular: Normal heart size. No significant pericardial effusion/thickening. Left anterior descending coronary atherosclerosis. Great vessels are normal in course and caliber. No central pulmonary emboli. Mediastinum/Nodes: No discrete thyroid nodules. Unremarkable esophagus. No pathologically enlarged axillary, mediastinal or hilar lymph nodes. Lungs/Pleura: No pneumothorax. No pleural effusion. Status post right upper lobectomy. Stable curvilinear parenchymal band in the upper right lung compatible with mild postsurgical scarring. No acute consolidative airspace disease or lung masses. Several scattered small solid pulmonary nodules in the right greater than left lungs, all stable, largest 5 mm in the posterior right lower lobe (series 4/image 64). No new significant pulmonary nodules. Musculoskeletal: No aggressive appearing focal osseous lesions. Partially visualized left shoulder arthroplasty. Minimal thoracic spondylosis. CT ABDOMEN PELVIS FINDINGS Hepatobiliary: Normal liver with no liver mass. Normal gallbladder with no radiopaque cholelithiasis. No biliary ductal dilatation. Pancreas: Normal, with no mass or duct dilation. Spleen: Normal size. No mass. Adrenals/Urinary Tract: Normal adrenals. Normal kidneys with no hydronephrosis and no renal mass. Normal bladder. Stomach/Bowel: Normal non-distended stomach. Normal caliber small bowel with no small bowel wall thickening. Appendix not discretely visualized. Oral contrast transits to the pelvic small bowel. Diffuse large colonic stool volume. No large  bowel wall thickening, diverticulosis or significant pericolonic fat stranding. Vascular/Lymphatic: Atherosclerotic nonaneurysmal abdominal aorta. Patent portal, splenic, hepatic and renal veins. No pathologically enlarged lymph nodes in the abdomen or pelvis. Reproductive: Grossly normal small uterus.  No adnexal mass. Other: No pneumoperitoneum, ascites or focal fluid collection. Musculoskeletal: No aggressive appearing focal osseous lesions. Minimal lumbar spondylosis. IMPRESSION: 1. No evidence of local tumor recurrence status post right upper lobectomy. 2. No evidence of metastatic disease in the chest, abdomen or pelvis. Scattered tiny pulmonary nodules are all stable and presumably benign. 3. Diffuse large colonic stool volume, suggesting constipation. 4. One vessel coronary atherosclerosis. 5. Aortic Atherosclerosis (ICD10-I70.0). Electronically Signed   By: Ilona Sorrel M.D.   On: 07/27/2020 14:00   CT Abdomen Pelvis W Contrast  Result Date: 07/27/2020 CLINICAL DATA:  History of right upper lobectomy for non-small cell lung cancer in 2017, adjuvant chemotherapy and radiation therapy and ongoing oral chemotherapy. Restaging. EXAM: CT CHEST, ABDOMEN, AND PELVIS WITH CONTRAST TECHNIQUE: Multidetector CT imaging of the chest, abdomen and pelvis was performed following the standard protocol during bolus administration of intravenous contrast. CONTRAST:  34mL OMNIPAQUE IOHEXOL 300 MG/ML  SOLN COMPARISON:  03/24/2020 CT chest, abdomen and pelvis. FINDINGS: CT CHEST FINDINGS Cardiovascular: Normal heart size. No significant pericardial effusion/thickening. Left anterior descending coronary atherosclerosis.  Great vessels are normal in course and caliber. No central pulmonary emboli. Mediastinum/Nodes: No discrete thyroid nodules. Unremarkable esophagus. No pathologically enlarged axillary, mediastinal or hilar lymph nodes. Lungs/Pleura: No pneumothorax. No pleural effusion. Status post right upper lobectomy.  Stable curvilinear parenchymal band in the upper right lung compatible with mild postsurgical scarring. No acute consolidative airspace disease or lung masses. Several scattered small solid pulmonary nodules in the right greater than left lungs, all stable, largest 5 mm in the posterior right lower lobe (series 4/image 64). No new significant pulmonary nodules. Musculoskeletal: No aggressive appearing focal osseous lesions. Partially visualized left shoulder arthroplasty. Minimal thoracic spondylosis. CT ABDOMEN PELVIS FINDINGS Hepatobiliary: Normal liver with no liver mass. Normal gallbladder with no radiopaque cholelithiasis. No biliary ductal dilatation. Pancreas: Normal, with no mass or duct dilation. Spleen: Normal size. No mass. Adrenals/Urinary Tract: Normal adrenals. Normal kidneys with no hydronephrosis and no renal mass. Normal bladder. Stomach/Bowel: Normal non-distended stomach. Normal caliber small bowel with no small bowel wall thickening. Appendix not discretely visualized. Oral contrast transits to the pelvic small bowel. Diffuse large colonic stool volume. No large bowel wall thickening, diverticulosis or significant pericolonic fat stranding. Vascular/Lymphatic: Atherosclerotic nonaneurysmal abdominal aorta. Patent portal, splenic, hepatic and renal veins. No pathologically enlarged lymph nodes in the abdomen or pelvis. Reproductive: Grossly normal small uterus.  No adnexal mass. Other: No pneumoperitoneum, ascites or focal fluid collection. Musculoskeletal: No aggressive appearing focal osseous lesions. Minimal lumbar spondylosis. IMPRESSION: 1. No evidence of local tumor recurrence status post right upper lobectomy. 2. No evidence of metastatic disease in the chest, abdomen or pelvis. Scattered tiny pulmonary nodules are all stable and presumably benign. 3. Diffuse large colonic stool volume, suggesting constipation. 4. One vessel coronary atherosclerosis. 5. Aortic Atherosclerosis (ICD10-I70.0).  Electronically Signed   By: Ilona Sorrel M.D.   On: 07/27/2020 14:00    ASSESSMENT AND PLAN:  This is a very pleasant 70 years old white female with stage IIIA non-small cell lung cancer, adenocarcinoma with positive EGFR mutation in exon 19 status post right upper lobectomy with lymph node dissection followed by adjuvant systemic chemotherapy as well as adjuvant radiation. The patient has been on observation for almost 3 years now.  She is feeling fine with no concerning complaints.   Unfortunately her scan in June 2020 showed multiple tiny pulmonary nodules scattered throughout both lungs which many of them have increased in size and some new nodule suspected the previous scan.  The patient started treatment with Tagrisso 80 mg p.o. daily status post 20 months of treatment.   The patient continues to tolerate her treatment with Tagrisso fairly well with no significant complaints. She had repeat CT scan of the chest, abdomen pelvis performed recently.  I personally and independently reviewed the scans and discussed the results with the patient and her husband. Her scan showed no concerning findings for disease progression. I recommended for her to continue her current treatment with Tagrisso with the same dose. I will see her back for follow-up visit in 2 months for evaluation and repeat blood work. The patient was advised to call immediately if she has any other concerning symptoms in the interval. The patient voices understanding of current disease status and treatment options and is in agreement with the current care plan. All questions were answered. The patient knows to call the clinic with any problems, questions or concerns. We can certainly see the patient much sooner if necessary.  Disclaimer: This note was dictated with voice recognition software. Similar  sounding words can inadvertently be transcribed and may not be corrected upon review.

## 2020-08-10 DIAGNOSIS — C3491 Malignant neoplasm of unspecified part of right bronchus or lung: Secondary | ICD-10-CM | POA: Diagnosis not present

## 2020-08-10 DIAGNOSIS — M810 Age-related osteoporosis without current pathological fracture: Secondary | ICD-10-CM | POA: Diagnosis not present

## 2020-08-10 DIAGNOSIS — M8580 Other specified disorders of bone density and structure, unspecified site: Secondary | ICD-10-CM | POA: Diagnosis not present

## 2020-08-10 DIAGNOSIS — I2510 Atherosclerotic heart disease of native coronary artery without angina pectoris: Secondary | ICD-10-CM | POA: Diagnosis not present

## 2020-08-11 ENCOUNTER — Encounter: Payer: Self-pay | Admitting: Internal Medicine

## 2020-08-12 ENCOUNTER — Other Ambulatory Visit: Payer: Self-pay | Admitting: Physician Assistant

## 2020-08-12 DIAGNOSIS — C3491 Malignant neoplasm of unspecified part of right bronchus or lung: Secondary | ICD-10-CM

## 2020-08-18 MED FILL — TAGRISSO 80 MG TABLET: 80 | 30 days supply | Qty: 30 | Fill #0

## 2020-08-24 DIAGNOSIS — K5903 Drug induced constipation: Secondary | ICD-10-CM | POA: Diagnosis not present

## 2020-09-10 ENCOUNTER — Other Ambulatory Visit (HOSPITAL_COMMUNITY): Payer: Self-pay

## 2020-09-10 DIAGNOSIS — H2513 Age-related nuclear cataract, bilateral: Secondary | ICD-10-CM | POA: Diagnosis not present

## 2020-09-10 DIAGNOSIS — H25013 Cortical age-related cataract, bilateral: Secondary | ICD-10-CM | POA: Diagnosis not present

## 2020-09-10 DIAGNOSIS — H04123 Dry eye syndrome of bilateral lacrimal glands: Secondary | ICD-10-CM | POA: Diagnosis not present

## 2020-09-10 DIAGNOSIS — H5213 Myopia, bilateral: Secondary | ICD-10-CM | POA: Diagnosis not present

## 2020-09-13 ENCOUNTER — Other Ambulatory Visit (HOSPITAL_COMMUNITY): Payer: Self-pay

## 2020-09-13 MED FILL — Osimertinib Mesylate Tab 80 MG (Base Equivalent): ORAL | 30 days supply | Qty: 30 | Fill #0 | Status: AC

## 2020-09-14 ENCOUNTER — Other Ambulatory Visit (HOSPITAL_COMMUNITY): Payer: Self-pay

## 2020-09-23 ENCOUNTER — Inpatient Hospital Stay: Payer: Medicare Other

## 2020-09-23 ENCOUNTER — Telehealth: Payer: Self-pay | Admitting: Internal Medicine

## 2020-09-23 ENCOUNTER — Inpatient Hospital Stay: Payer: Medicare Other | Attending: Internal Medicine | Admitting: Internal Medicine

## 2020-09-23 ENCOUNTER — Other Ambulatory Visit: Payer: Self-pay

## 2020-09-23 VITALS — BP 127/67 | HR 91 | Temp 97.1°F | Resp 18 | Ht 59.0 in | Wt 90.9 lb

## 2020-09-23 DIAGNOSIS — C3490 Malignant neoplasm of unspecified part of unspecified bronchus or lung: Secondary | ICD-10-CM | POA: Diagnosis not present

## 2020-09-23 DIAGNOSIS — C3411 Malignant neoplasm of upper lobe, right bronchus or lung: Secondary | ICD-10-CM | POA: Insufficient documentation

## 2020-09-23 DIAGNOSIS — C3491 Malignant neoplasm of unspecified part of right bronchus or lung: Secondary | ICD-10-CM

## 2020-09-23 DIAGNOSIS — R918 Other nonspecific abnormal finding of lung field: Secondary | ICD-10-CM | POA: Diagnosis not present

## 2020-09-23 DIAGNOSIS — Z5111 Encounter for antineoplastic chemotherapy: Secondary | ICD-10-CM | POA: Diagnosis not present

## 2020-09-23 LAB — CBC WITH DIFFERENTIAL (CANCER CENTER ONLY)
Abs Immature Granulocytes: 0.01 10*3/uL (ref 0.00–0.07)
Basophils Absolute: 0 10*3/uL (ref 0.0–0.1)
Basophils Relative: 1 %
Eosinophils Absolute: 0.1 10*3/uL (ref 0.0–0.5)
Eosinophils Relative: 1 %
HCT: 40.4 % (ref 36.0–46.0)
Hemoglobin: 13.7 g/dL (ref 12.0–15.0)
Immature Granulocytes: 0 %
Lymphocytes Relative: 20 %
Lymphs Abs: 1 10*3/uL (ref 0.7–4.0)
MCH: 31.3 pg (ref 26.0–34.0)
MCHC: 33.9 g/dL (ref 30.0–36.0)
MCV: 92.2 fL (ref 80.0–100.0)
Monocytes Absolute: 0.7 10*3/uL (ref 0.1–1.0)
Monocytes Relative: 14 %
Neutro Abs: 3.2 10*3/uL (ref 1.7–7.7)
Neutrophils Relative %: 64 %
Platelet Count: 180 10*3/uL (ref 150–400)
RBC: 4.38 MIL/uL (ref 3.87–5.11)
RDW: 12.8 % (ref 11.5–15.5)
WBC Count: 4.9 10*3/uL (ref 4.0–10.5)
nRBC: 0 % (ref 0.0–0.2)

## 2020-09-23 LAB — CMP (CANCER CENTER ONLY)
ALT: 13 U/L (ref 0–44)
AST: 21 U/L (ref 15–41)
Albumin: 4.1 g/dL (ref 3.5–5.0)
Alkaline Phosphatase: 57 U/L (ref 38–126)
Anion gap: 12 (ref 5–15)
BUN: 24 mg/dL — ABNORMAL HIGH (ref 8–23)
CO2: 28 mmol/L (ref 22–32)
Calcium: 9.5 mg/dL (ref 8.9–10.3)
Chloride: 101 mmol/L (ref 98–111)
Creatinine: 0.96 mg/dL (ref 0.44–1.00)
GFR, Estimated: >60 mL/min (ref >60)
Glucose, Bld: 80 mg/dL (ref 70–99)
Potassium: 4.6 mmol/L (ref 3.5–5.1)
Sodium: 141 mmol/L (ref 135–145)
Total Bilirubin: 0.4 mg/dL (ref 0.3–1.2)
Total Protein: 6.6 g/dL (ref 6.5–8.1)

## 2020-09-23 NOTE — Progress Notes (Signed)
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  PRIOR THERAPY: 1) Right VATS with right upper lobectomy with bronchoplasty closure and en bloc resection of a wedge from the superior segment of the right lower lobe in addition to mediastinal lymph node dissection on 07/30/2015 under the care of Dr. Roxan Hockey. 2) Adjuvant systemic chemotherapy with cisplatin 75 MG/M2 and Alimta 500 MG/M2 every 3 weeks. First dose was given on 09/10/2015. Status post 4 cycles. 3) adjuvant radiotherapy under the care of Dr. Tammi Klippel.  CURRENT THERAPY: Tagrisso 80 mg p.o. daily started 12/01/2018.  Status post 22 months of treatment.  INTERVAL HISTORY: [name] West 70 y.o. female returns to the clinic today for follow-up visit.  The patient is feeling fine today with no concerning complaints.  She had almost 2 weeks of GI issues after the oral contrast last time she had a scan of the abdomen.  She denied having any current chest pain, shortness of breath, cough or hemoptysis.  She denied having any fever or chills.  She has no nausea, vomiting, diarrhea or constipation.  She continues to have mild rash with nail changes.  She is here today for evaluation and repeat blood work.  MEDICAL HISTORY: Past Medical History:  Diagnosis Date  . Anxiety   . Arthritis    shoulders  . Cancer ([place]) 06/2014   Lung Cancer  . Complication of anesthesia    nausea, does well with PROPOFOL   . Cough 09/17/2015  . Encounter for antineoplastic chemotherapy 10/22/2015  . Environmental and seasonal  allergies   . Family history of adverse reaction to anesthesia    Patients grandmother died while having hand surgery; pt unsure of cause  . Oral thrush 10/22/2015  . Ovarian cyst   . PONV (postoperative nausea and vomiting)   . Raynaud disease     ALLERGIES:  is allergic to avelox [moxifloxacin hcl in nacl], cephalexin, cephalosporins, ciprofloxacin hcl, clindamycin/lincomycin, penicillins, shrimp [shellfish allergy], macrobid [nitrofurantoin monohyd macro], omnipaque [iohexol], azithromycin, fluconazole, adhesive [tape], lidoderm [lidocaine], pseudoephedrine, and sulfa antibiotics.  MEDICATIONS:  Current Outpatient Medications  Medication Sig Dispense Refill  . cholecalciferol (VITAMIN D) 1000 UNITS tablet Take 1,000 Units by mouth daily.    . citalopram (CELEXA) 10 MG tablet Take 10 mg by mouth daily.    . Clobetasol Prop Oint-Coal Tar (CLOBETAPLUS OINTMENT EX) Apply 1 Dose topically daily.    Marland Kitchen EPINEPHrine 0.3 mg/0.3 mL IJ SOAJ injection Inject into the muscle as directed.    Marland Kitchen ibuprofen (ADVIL,MOTRIN) 200 MG tablet Take 200 mg by mouth every 6 (six) hours as needed (pain).    Marland Kitchen levocetirizine (XYZAL ALLERGY 24HR) 5 MG tablet 1 tablet in the evening    . LORazepam (ATIVAN) 0.5 MG tablet Take 0.5 mg by mouth at bedtime as needed for anxiety or sleep.     . Multiple Vitamin (MULTIVITAMIN WITH MINERALS) TABS Take 1 tablet by mouth daily.    . NONFORMULARY OR COMPOUNDED ITEM Place 1 application vaginally 2 (two) times a week. Estradiol 0.02% Cream    . osimertinib mesylate (TAGRISSO) 80 MG tablet TAKE 1 TABLET (80  MG TOTAL) BY MOUTH DAILY. 30 tablet 2  . predniSONE (DELTASONE) 50 MG tablet Take one tablet 13 hours , one tablet 7 hours and 1 tablet 1 hours prior to CT scan for CT dye allergy. 3 tablet 3  . Probiotic Product (ALIGN) 4 MG CAPS Take 1 capsule by mouth daily.    . Wheat Dextrin (BENEFIBER) POWD See admin instructions.     No current facility-administered medications for this  visit.    SURGICAL HISTORY:  Past Surgical History:  Procedure Laterality Date  . BREAST EXCISIONAL BIOPSY Left    1996  . CESAREAN SECTION  1984  . COLONOSCOPY WITH PROPOFOL N/A 09/27/2012   Procedure: COLONOSCOPY WITH PROPOFOL;  Surgeon: Garlan Fair, MD;  Location: [facility];  Service: Endoscopy;  Laterality: N/A;  . Fibroid Removed    . LAPAROTOMY Left 07/20/2016   Procedure: MINI LAPAROTOMY, REMOVAL OF LEFT OVARIAN CYST, LEFT SALPINGO-OOPHORECTOMY;  Surgeon: Dian Queen, MD;  Location: [facility];  Service: Gynecology;  Laterality: Left;  please moved to 7:30am if spot opens  . Right Rotator Cuff Right   . TOTAL SHOULDER ARTHROPLASTY Left 06/14/2018   Procedure: TOTAL SHOULDER ARTHROPLASTY;  Surgeon: Justice Britain, MD;  Location: [facility];  Service: Orthopedics;  Laterality: Left;  179mn  . VIDEO ASSISTED THORACOSCOPY (VATS)/ LOBECTOMY Right 07/30/2015   Procedure: RIGHT VIDEO ASSISTED THORACOSCOPY (VATS)/RIGHT UPPER LOBECTOMY;  Surgeon: SMelrose Nakayama MD;  Location: [facility]  Service: Thoracic;  Laterality: Right;  .Marland KitchenVIDEO BRONCHOSCOPY WITH ENDOBRONCHIAL NAVIGATION N/A 07/15/2015   Procedure: VIDEO BRONCHOSCOPY WITH ENDOBRONCHIAL NAVIGATION with Biopsy;  Surgeon: RCollene Gobble MD;  Location: [facility];  Service: Thoracic;  Laterality: N/A;    REVIEW OF SYSTEMS:  A comprehensive review of systems was negative.   PHYSICAL EXAMINATION: General appearance: alert, cooperative and no distress Head: Normocephalic, without obvious abnormality, atraumatic Neck: no adenopathy, no JVD, supple, symmetrical, trachea midline and thyroid not enlarged, symmetric, no tenderness/mass/nodules Lymph nodes: Cervical, supraclavicular, and axillary nodes normal. Resp: clear to auscultation bilaterally Back: symmetric, no curvature. ROM normal. No CVA tenderness. Cardio: regular rate and rhythm, S1, S2 normal, no murmur, click, rub or gallop GI: soft, non-tender; bowel sounds normal; no masses,  no  organomegaly Extremities: extremities normal, atraumatic, no cyanosis or edema  ECOG PERFORMANCE STATUS: 0 - Asymptomatic  Blood pressure 127/67, pulse 91, temperature (!) 97.1 F (36.2 C), temperature source Tympanic, resp. rate 18, height 4' 11" (1.499 m), weight 90 lb 14.4 oz (41.2 kg), SpO2 99 %.   LABORATORY DATA: Lab Results  Component Value Date   WBC 4.9 09/23/2020   HGB 13.7 09/23/2020   HCT 40.4 09/23/2020   MCV 92.2 09/23/2020   PLT 180 09/23/2020      Chemistry      Component Value Date/Time   NA 138 07/27/2020 1031   NA 139 06/16/2017 1132   K 4.7 07/27/2020 1031   K 4.7 06/16/2017 1132   CL 102 07/27/2020 1031   CO2 24 07/27/2020 1031   CO2 28 06/16/2017 1132   BUN 24 (H) 07/27/2020 1031   BUN 19.5 06/16/2017 1132   CREATININE 1.10 (H) 07/27/2020 1031   CREATININE 0.8 06/16/2017 1132      Component Value Date/Time   CALCIUM 9.7 07/27/2020 1031   CALCIUM 10.0 06/16/2017 1132   ALKPHOS 67 07/27/2020 1031   ALKPHOS 68 06/16/2017 1132   AST 26 07/27/2020 1031   AST 21 06/16/2017 1132   ALT 19 07/27/2020 1031  ALT 18 06/16/2017 1132   BILITOT 0.4 07/27/2020 1031   BILITOT 0.50 06/16/2017 1132       RADIOGRAPHIC STUDIES: No results found.  ASSESSMENT AND PLAN:  This is a very pleasant 70 years old white female with stage IIIA non-small cell lung cancer, adenocarcinoma with positive EGFR mutation in exon 19 status post right upper lobectomy with lymph node dissection followed by adjuvant systemic chemotherapy as well as adjuvant radiation. The patient has been on observation for almost 3 years now.  She is feeling fine with no concerning complaints.   Unfortunately her scan in June 2020 showed multiple tiny pulmonary nodules scattered throughout both lungs which many of them have increased in size and some new nodule suspected the previous scan.  The patient started treatment with Tagrisso 80 mg p.o. daily status post 22 months of treatment.   The  patient has been tolerating this treatment well with no concerning adverse effects. CBC today is unremarkable.  Comprehensive metabolic panel is still pending. I recommended for the patient to continue her current treatment with Tagrisso with the same dose. I will see her back for follow-up visit in 2 months for evaluation with repeat CT scan of the chest. She was advised to call immediately if she has any other concerning symptoms in the interval. The patient voices understanding of current disease status and treatment options and is in agreement with the current care plan. All questions were answered. The patient knows to call the clinic with any problems, questions or concerns. We can certainly see the patient much sooner if necessary.  Disclaimer: This note was dictated with voice recognition software. Similar sounding words can inadvertently be transcribed and may not be corrected upon review.

## 2020-09-23 NOTE — Telephone Encounter (Signed)
Scheduled appt per 4/13 sch msg. Pt aware.

## 2020-10-06 ENCOUNTER — Other Ambulatory Visit (HOSPITAL_COMMUNITY): Payer: Self-pay

## 2020-10-06 MED FILL — Osimertinib Mesylate Tab 80 MG (Base Equivalent): ORAL | 30 days supply | Qty: 30 | Fill #1 | Status: AC

## 2020-10-14 ENCOUNTER — Other Ambulatory Visit (HOSPITAL_COMMUNITY): Payer: Self-pay

## 2020-10-19 ENCOUNTER — Encounter: Payer: Self-pay | Admitting: Internal Medicine

## 2020-10-28 ENCOUNTER — Other Ambulatory Visit (HOSPITAL_COMMUNITY): Payer: Self-pay

## 2020-11-04 ENCOUNTER — Other Ambulatory Visit: Payer: Self-pay | Admitting: Physician Assistant

## 2020-11-04 ENCOUNTER — Other Ambulatory Visit (HOSPITAL_COMMUNITY): Payer: Self-pay

## 2020-11-04 DIAGNOSIS — C3491 Malignant neoplasm of unspecified part of right bronchus or lung: Secondary | ICD-10-CM

## 2020-11-05 MED ORDER — OSIMERTINIB MESYLATE 80 MG PO TABS
ORAL_TABLET | Freq: Every day | ORAL | 2 refills | Status: DC
  Filled 2020-11-16: qty 30, 30d supply, fill #0
  Filled 2020-12-08: qty 30, 30d supply, fill #1
  Filled 2021-01-06: qty 30, 30d supply, fill #2

## 2020-11-16 ENCOUNTER — Other Ambulatory Visit (HOSPITAL_COMMUNITY): Payer: Self-pay

## 2020-11-16 DIAGNOSIS — U071 COVID-19: Secondary | ICD-10-CM | POA: Diagnosis not present

## 2020-11-17 ENCOUNTER — Encounter: Payer: Self-pay | Admitting: Internal Medicine

## 2020-11-23 ENCOUNTER — Other Ambulatory Visit: Payer: Self-pay

## 2020-11-23 ENCOUNTER — Ambulatory Visit (HOSPITAL_COMMUNITY)
Admission: RE | Admit: 2020-11-23 | Discharge: 2020-11-23 | Disposition: A | Discharge status: Home / Self Care | Payer: Medicare Other | Source: Ambulatory Visit | Attending: Internal Medicine | Admitting: Internal Medicine

## 2020-11-23 ENCOUNTER — Inpatient Hospital Stay: Payer: Medicare Other | Attending: Internal Medicine

## 2020-11-23 DIAGNOSIS — C3490 Malignant neoplasm of unspecified part of unspecified bronchus or lung: Secondary | ICD-10-CM

## 2020-11-23 DIAGNOSIS — Z902 Acquired absence of lung [part of]: Secondary | ICD-10-CM | POA: Insufficient documentation

## 2020-11-23 DIAGNOSIS — R918 Other nonspecific abnormal finding of lung field: Secondary | ICD-10-CM | POA: Diagnosis not present

## 2020-11-23 DIAGNOSIS — C3411 Malignant neoplasm of upper lobe, right bronchus or lung: Secondary | ICD-10-CM | POA: Insufficient documentation

## 2020-11-23 DIAGNOSIS — R739 Hyperglycemia, unspecified: Secondary | ICD-10-CM | POA: Insufficient documentation

## 2020-11-23 DIAGNOSIS — I2510 Atherosclerotic heart disease of native coronary artery without angina pectoris: Secondary | ICD-10-CM | POA: Diagnosis not present

## 2020-11-23 LAB — CMP (CANCER CENTER ONLY)
ALT: 16 U/L (ref 0–44)
AST: 21 U/L (ref 15–41)
Albumin: 4.2 g/dL (ref 3.5–5.0)
Alkaline Phosphatase: 60 U/L (ref 38–126)
Anion gap: 8 (ref 5–15)
BUN: 25 mg/dL — ABNORMAL HIGH (ref 8–23)
CO2: 26 mmol/L (ref 22–32)
Calcium: 9.8 mg/dL (ref 8.9–10.3)
Chloride: 101 mmol/L (ref 98–111)
Creatinine: 1.04 mg/dL — ABNORMAL HIGH (ref 0.44–1.00)
GFR, Estimated: 57 mL/min — ABNORMAL LOW (ref >60)
Glucose, Bld: 183 mg/dL — ABNORMAL HIGH (ref 70–99)
Potassium: 4.6 mmol/L (ref 3.5–5.1)
Sodium: 135 mmol/L (ref 135–145)
Total Bilirubin: 0.4 mg/dL (ref 0.3–1.2)
Total Protein: 7.3 g/dL (ref 6.5–8.1)

## 2020-11-23 LAB — CBC WITH DIFFERENTIAL (CANCER CENTER ONLY)
Abs Immature Granulocytes: 0.04 10*3/uL (ref 0.00–0.07)
Basophils Absolute: 0 10*3/uL (ref 0.0–0.1)
Basophils Relative: 0 %
Eosinophils Absolute: 0 10*3/uL (ref 0.0–0.5)
Eosinophils Relative: 0 %
HCT: 42.4 % (ref 36.0–46.0)
Hemoglobin: 14.2 g/dL (ref 12.0–15.0)
Immature Granulocytes: 0 %
Lymphocytes Relative: 9 %
Lymphs Abs: 0.8 10*3/uL (ref 0.7–4.0)
MCH: 30.7 pg (ref 26.0–34.0)
MCHC: 33.5 g/dL (ref 30.0–36.0)
MCV: 91.6 fL (ref 80.0–100.0)
Monocytes Absolute: 0.3 10*3/uL (ref 0.1–1.0)
Monocytes Relative: 3 %
Neutro Abs: 8.2 10*3/uL — ABNORMAL HIGH (ref 1.7–7.7)
Neutrophils Relative %: 88 %
Platelet Count: 210 10*3/uL (ref 150–400)
RBC: 4.63 MIL/uL (ref 3.87–5.11)
RDW: 12.7 % (ref 11.5–15.5)
WBC Count: 9.4 10*3/uL (ref 4.0–10.5)
nRBC: 0 % (ref 0.0–0.2)

## 2020-11-23 MED ORDER — IOHEXOL 300 MG/ML  SOLN
75.0000 mL | Freq: Once | INTRAMUSCULAR | Status: AC | PRN
  Administered 2020-11-23: 60 mL via INTRAVENOUS

## 2020-11-23 MED ORDER — SODIUM CHLORIDE (PF) 0.9 % IJ SOLN
INTRAMUSCULAR | Status: AC
  Filled 2020-11-23: qty 50

## 2020-11-24 ENCOUNTER — Inpatient Hospital Stay (HOSPITAL_BASED_OUTPATIENT_CLINIC_OR_DEPARTMENT_OTHER): Payer: Medicare Other | Admitting: Internal Medicine

## 2020-11-24 VITALS — BP 113/58 | HR 83 | Temp 97.8°F | Resp 18 | Ht 59.0 in | Wt 92.4 lb

## 2020-11-24 DIAGNOSIS — C3491 Malignant neoplasm of unspecified part of right bronchus or lung: Secondary | ICD-10-CM | POA: Diagnosis not present

## 2020-11-24 DIAGNOSIS — C781 Secondary malignant neoplasm of mediastinum: Secondary | ICD-10-CM

## 2020-11-24 DIAGNOSIS — Z902 Acquired absence of lung [part of]: Secondary | ICD-10-CM | POA: Diagnosis not present

## 2020-11-24 DIAGNOSIS — Z5111 Encounter for antineoplastic chemotherapy: Secondary | ICD-10-CM

## 2020-11-24 DIAGNOSIS — R739 Hyperglycemia, unspecified: Secondary | ICD-10-CM | POA: Diagnosis not present

## 2020-11-24 DIAGNOSIS — C3411 Malignant neoplasm of upper lobe, right bronchus or lung: Secondary | ICD-10-CM | POA: Diagnosis not present

## 2020-11-24 NOTE — Progress Notes (Signed)
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  PRIOR THERAPY: 1) Right VATS with right upper lobectomy with bronchoplasty closure and en bloc resection of a wedge from the superior segment of the right lower lobe in addition to mediastinal lymph node dissection on 07/30/2015 under the care of Dr. Roxan Hockey. 2) Adjuvant systemic chemotherapy with cisplatin 75 MG/M2 and Alimta 500 MG/M2 every 3 weeks. First dose was given on 09/10/2015. Status post 4 cycles. 3) adjuvant radiotherapy under the care of Dr. Tammi Klippel.  CURRENT THERAPY: Tagrisso 80 mg p.o. daily started 12/01/2018.  Status post 24 months of treatment.  INTERVAL HISTORY: [name] West 70 y.o. female returns to the clinic today for follow-up visit accompanied by her husband.  The patient is feeling fine today with no concerning complaints except for skin rash from the prednisone premedication for the scan yesterday.  She denied having any current chest pain, shortness of breath, cough or hemoptysis.  She denied having any fever or chills.  She has no nausea, vomiting, diarrhea or constipation.  She has no headache or visual changes.  She denied having any significant weight loss or night sweats.  She continues to tolerate her treatment with Tagrisso fairly well.  She had repeat CT scan of the chest performed yesterday and she is here for evaluation and discussion of her scan results and recommendation regarding her condition.   MEDICAL HISTORY: Past Medical History:  Diagnosis Date   Anxiety    Arthritis     shoulders   Cancer ([place]) 06/2014   Lung Cancer   Complication of anesthesia    nausea, does well with PROPOFOL    Cough 09/17/2015   Encounter for antineoplastic chemotherapy 10/22/2015   Environmental and seasonal allergies    Family history of adverse reaction to anesthesia    Patients grandmother died while having hand surgery; pt unsure of cause   Oral thrush 10/22/2015   Ovarian cyst    PONV (postoperative nausea and vomiting)    Raynaud disease     ALLERGIES:  is allergic to avelox [moxifloxacin hcl in nacl], cephalexin, cephalosporins, ciprofloxacin hcl, clindamycin/lincomycin, penicillins, shrimp [shellfish allergy], macrobid [nitrofurantoin monohyd macro], omnipaque [iohexol], azithromycin, fluconazole, adhesive [tape], lidoderm [lidocaine], pseudoephedrine, and sulfa antibiotics.  MEDICATIONS:  Current Outpatient Medications  Medication Sig Dispense Refill   cholecalciferol (VITAMIN D) 1000 UNITS tablet Take 1,000 Units by mouth daily.     citalopram (CELEXA) 10 MG tablet Take 10 mg by mouth daily.     Clobetasol Prop Oint-Coal Tar (CLOBETAPLUS OINTMENT EX) Apply 1 Dose topically daily.     EPINEPHrine 0.3 mg/0.3 mL IJ SOAJ injection Inject into the muscle as directed.     ibuprofen (ADVIL,MOTRIN) 200 MG tablet Take 200 mg by mouth every 6 (six) hours as needed (pain).     levocetirizine (XYZAL ALLERGY 24HR) 5 MG tablet 1 tablet in the evening     LORazepam (ATIVAN) 0.5 MG tablet Take 0.5 mg by mouth at bedtime as needed for anxiety or sleep.      Multiple Vitamin (MULTIVITAMIN WITH MINERALS) TABS Take 1 tablet by mouth daily.  NONFORMULARY OR COMPOUNDED ITEM Place 1 application vaginally 2 (two) times a week. Estradiol 0.02% Cream     osimertinib mesylate (TAGRISSO) 80 MG tablet TAKE 1 TABLET (80 MG TOTAL) BY MOUTH DAILY. 30 tablet 2   predniSONE (DELTASONE) 50 MG tablet Take one tablet 13 hours , one tablet 7 hours and 1 tablet 1 hours prior to CT scan for CT dye allergy.  3 tablet 3   Probiotic Product (ALIGN) 4 MG CAPS Take 1 capsule by mouth daily.     Wheat Dextrin (BENEFIBER) POWD See admin instructions.     No current facility-administered medications for this visit.    SURGICAL HISTORY:  Past Surgical History:  Procedure Laterality Date   BREAST EXCISIONAL BIOPSY Left    [place]   COLONOSCOPY WITH PROPOFOL N/A 09/27/2012   Procedure: COLONOSCOPY WITH PROPOFOL;  Surgeon: Garlan Fair, MD;  Location: [facility];  Service: Endoscopy;  Laterality: N/A;   Fibroid Removed     LAPAROTOMY Left 07/20/2016   Procedure: MINI LAPAROTOMY, REMOVAL OF LEFT OVARIAN CYST, LEFT SALPINGO-OOPHORECTOMY;  Surgeon: Dian Queen, MD;  Location: [facility];  Service: Gynecology;  Laterality: Left;  please moved to 7:30am if spot opens   Right Rotator Cuff Right    TOTAL SHOULDER ARTHROPLASTY Left 06/14/2018   Procedure: TOTAL SHOULDER ARTHROPLASTY;  Surgeon: Justice Britain, MD;  Location: [facility];  Service: Orthopedics;  Laterality: Left;  167min   VIDEO ASSISTED THORACOSCOPY (VATS)/ LOBECTOMY Right 07/30/2015   Procedure: RIGHT VIDEO ASSISTED THORACOSCOPY (VATS)/RIGHT UPPER LOBECTOMY;  Surgeon: Melrose Nakayama, MD;  Location: [facility];  Service: Thoracic;  Laterality: Right;   VIDEO BRONCHOSCOPY WITH ENDOBRONCHIAL NAVIGATION N/A 07/15/2015   Procedure: VIDEO BRONCHOSCOPY WITH ENDOBRONCHIAL NAVIGATION with Biopsy;  Surgeon: Collene Gobble, MD;  Location: [facility];  Service: Thoracic;  Laterality: N/A;    REVIEW OF SYSTEMS:  Constitutional: negative Eyes: negative Ears, nose, mouth, throat, and face: negative Respiratory: negative Cardiovascular: negative Gastrointestinal: negative Genitourinary:negative Integument/breast: positive for rash Hematologic/lymphatic: negative Musculoskeletal:negative Neurological: negative Behavioral/Psych: negative Endocrine: negative Allergic/Immunologic: negative   PHYSICAL EXAMINATION: General appearance: alert,  cooperative, and no distress Head: Normocephalic, without obvious abnormality, atraumatic Neck: no adenopathy, no JVD, supple, symmetrical, trachea midline, and thyroid not enlarged, symmetric, no tenderness/mass/nodules Lymph nodes: Cervical, supraclavicular, and axillary nodes normal. Resp: clear to auscultation bilaterally Back: symmetric, no curvature. ROM normal. No CVA tenderness. Cardio: regular rate and rhythm, S1, S2 normal, no murmur, click, rub or gallop GI: soft, non-tender; bowel sounds normal; no masses,  no organomegaly Extremities: extremities normal, atraumatic, no cyanosis or edema Neurologic: Alert and oriented X 3, normal strength and tone. Normal symmetric reflexes. Normal coordination and gait  ECOG PERFORMANCE STATUS: 0 - Asymptomatic  Blood pressure (!) 113/58, pulse 83, temperature 97.8 F (36.6 C), temperature source Tympanic, resp. rate 18, height $RemoveBe'4\' 11"'grZNOOxNJ$  (1.499 m), weight 92 lb 6.4 oz (41.9 kg), SpO2 100 %.   LABORATORY DATA: Lab Results  Component Value Date   WBC 9.4 11/23/2020   HGB 14.2 11/23/2020   HCT 42.4 11/23/2020   MCV 91.6 11/23/2020   PLT 210 11/23/2020      Chemistry      Component Value Date/Time   NA 135 11/23/2020 1019   NA 139 06/16/2017 1132   K 4.6 11/23/2020 1019   K 4.7 06/16/2017 1132   CL 101 11/23/2020 1019   CO2 26 11/23/2020 1019   CO2 28 06/16/2017 1132   BUN 25 (  H) 11/23/2020 1019   BUN 19.5 06/16/2017 1132   CREATININE 1.04 (H) 11/23/2020 1019   CREATININE 0.8 06/16/2017 1132      Component Value Date/Time   CALCIUM 9.8 11/23/2020 1019   CALCIUM 10.0 06/16/2017 1132   ALKPHOS 60 11/23/2020 1019   ALKPHOS 68 06/16/2017 1132   AST 21 11/23/2020 1019   AST 21 06/16/2017 1132   ALT 16 11/23/2020 1019   ALT 18 06/16/2017 1132   BILITOT 0.4 11/23/2020 1019   BILITOT 0.50 06/16/2017 1132       RADIOGRAPHIC STUDIES: CT Chest W Contrast  Result Date: 11/23/2020 CLINICAL DATA:  Recurrent right upper lobe lung  adenocarcinoma, restaging EXAM: CT CHEST WITH CONTRAST TECHNIQUE: Multidetector CT imaging of the chest was performed during intravenous contrast administration. CONTRAST:  2mL OMNIPAQUE IOHEXOL 300 MG/ML  SOLN COMPARISON:  07/27/2020 FINDINGS: Cardiovascular: Normal heart size. Left coronary artery calcifications. No pericardial effusion. Mediastinum/Nodes: No enlarged mediastinal, hilar, or axillary lymph nodes. Thyroid gland, trachea, and esophagus demonstrate no significant findings. Lungs/Pleura: Status post right upper lobectomy and wedge resection of the right middle lobe. Multiple small bilateral pulmonary nodules are stable and benign, for example a 5 mm nodule of the dependent right lower lobe (series 5, image 70). No pleural effusion or pneumothorax. Upper Abdomen: No acute abnormality. Musculoskeletal: No chest wall mass or suspicious bone lesions identified. IMPRESSION: 1. Status post right upper lobectomy and wedge resection of the right middle lobe. No evidence of recurrent or metastatic disease in the chest. 2. Multiple small bilateral pulmonary nodules are stable and benign. 3. Coronary artery disease. Electronically Signed   By: Eddie Candle M.D.   On: 11/23/2020 15:51    ASSESSMENT AND PLAN:  This is a very pleasant 70 years old white female with stage IIIA non-small cell lung cancer, adenocarcinoma with positive EGFR mutation in exon 19 status post right upper lobectomy with lymph node dissection followed by adjuvant systemic chemotherapy as well as adjuvant radiation. The patient has been on observation for almost 3 years now.  She is feeling fine with no concerning complaints.   Unfortunately her scan in June 2020 showed multiple tiny pulmonary nodules scattered throughout both lungs which many of them have increased in size and some new nodule suspected the previous scan.  The patient started treatment with Tagrisso 80 mg p.o. daily status post 24 months of treatment.  The patient  continues to tolerate her treatment with Tagrisso fairly well with no concerning adverse effects. She had repeat CT scan of the chest performed yesterday.  I personally and independently reviewed the scan images and discussed the results with the patient and her husband today. Her scan showed no concerning findings for disease progression. I recommended for her to continue her current treatment with Tagrisso with the same dose. I will see her back for follow-up visit in 2 months for evaluation and repeat blood work. Her hyperglycemia on the blood work yesterday is related to the prednisone premedication for the scan. The patient was advised to call immediately if she has any other concerning symptoms in the interval. The patient voices understanding of current disease status and treatment options and is in agreement with the current care plan. All questions were answered. The patient knows to call the clinic with any problems, questions or concerns. We can certainly see the patient much sooner if necessary.  Disclaimer: This note was dictated with voice recognition software. Similar sounding words can inadvertently be transcribed and may not be  corrected upon review.

## 2020-11-25 ENCOUNTER — Encounter: Payer: Self-pay | Admitting: Internal Medicine

## 2020-12-03 DIAGNOSIS — M8580 Other specified disorders of bone density and structure, unspecified site: Secondary | ICD-10-CM | POA: Diagnosis not present

## 2020-12-03 DIAGNOSIS — C3491 Malignant neoplasm of unspecified part of right bronchus or lung: Secondary | ICD-10-CM | POA: Diagnosis not present

## 2020-12-03 DIAGNOSIS — I2510 Atherosclerotic heart disease of native coronary artery without angina pectoris: Secondary | ICD-10-CM | POA: Diagnosis not present

## 2020-12-03 DIAGNOSIS — M810 Age-related osteoporosis without current pathological fracture: Secondary | ICD-10-CM | POA: Diagnosis not present

## 2020-12-08 ENCOUNTER — Other Ambulatory Visit (HOSPITAL_COMMUNITY): Payer: Self-pay

## 2020-12-15 ENCOUNTER — Other Ambulatory Visit (HOSPITAL_COMMUNITY): Payer: Self-pay

## 2020-12-16 ENCOUNTER — Other Ambulatory Visit (HOSPITAL_COMMUNITY): Payer: Self-pay

## 2020-12-16 ENCOUNTER — Encounter: Payer: Self-pay | Admitting: Internal Medicine

## 2020-12-17 ENCOUNTER — Other Ambulatory Visit (HOSPITAL_COMMUNITY): Payer: Self-pay

## 2020-12-17 DIAGNOSIS — Z20822 Contact with and (suspected) exposure to covid-19: Secondary | ICD-10-CM | POA: Diagnosis not present

## 2020-12-30 DIAGNOSIS — L309 Dermatitis, unspecified: Secondary | ICD-10-CM | POA: Diagnosis not present

## 2020-12-30 DIAGNOSIS — D485 Neoplasm of uncertain behavior of skin: Secondary | ICD-10-CM | POA: Diagnosis not present

## 2020-12-30 DIAGNOSIS — L719 Rosacea, unspecified: Secondary | ICD-10-CM | POA: Diagnosis not present

## 2020-12-30 DIAGNOSIS — L814 Other melanin hyperpigmentation: Secondary | ICD-10-CM | POA: Diagnosis not present

## 2020-12-30 DIAGNOSIS — D225 Melanocytic nevi of trunk: Secondary | ICD-10-CM | POA: Diagnosis not present

## 2020-12-30 DIAGNOSIS — L821 Other seborrheic keratosis: Secondary | ICD-10-CM | POA: Diagnosis not present

## 2020-12-30 DIAGNOSIS — Z86018 Personal history of other benign neoplasm: Secondary | ICD-10-CM | POA: Diagnosis not present

## 2021-01-06 ENCOUNTER — Other Ambulatory Visit (HOSPITAL_COMMUNITY): Payer: Self-pay

## 2021-01-06 ENCOUNTER — Telehealth: Payer: Self-pay | Admitting: Internal Medicine

## 2021-01-06 DIAGNOSIS — I2510 Atherosclerotic heart disease of native coronary artery without angina pectoris: Secondary | ICD-10-CM

## 2021-01-06 DIAGNOSIS — E782 Mixed hyperlipidemia: Secondary | ICD-10-CM

## 2021-01-06 DIAGNOSIS — I2584 Coronary atherosclerosis due to calcified coronary lesion: Secondary | ICD-10-CM

## 2021-01-06 NOTE — Telephone Encounter (Signed)
NMR lipoprofile order entered. Message sent to patient

## 2021-01-06 NOTE — Telephone Encounter (Signed)
New Message:     Patient says she would like to have lab work before her appointment with Dr Debara Pickett on 06-17-21.

## 2021-01-08 ENCOUNTER — Other Ambulatory Visit: Payer: Self-pay | Admitting: Family Medicine

## 2021-01-08 DIAGNOSIS — Z1231 Encounter for screening mammogram for malignant neoplasm of breast: Secondary | ICD-10-CM

## 2021-01-09 ENCOUNTER — Other Ambulatory Visit (HOSPITAL_COMMUNITY): Payer: Self-pay

## 2021-01-11 ENCOUNTER — Other Ambulatory Visit (HOSPITAL_COMMUNITY): Payer: Self-pay

## 2021-01-12 ENCOUNTER — Other Ambulatory Visit: Payer: Self-pay | Admitting: Family Medicine

## 2021-01-12 DIAGNOSIS — M810 Age-related osteoporosis without current pathological fracture: Secondary | ICD-10-CM

## 2021-01-26 ENCOUNTER — Other Ambulatory Visit: Payer: Self-pay

## 2021-01-26 ENCOUNTER — Inpatient Hospital Stay: Payer: Medicare Other

## 2021-01-26 ENCOUNTER — Inpatient Hospital Stay: Payer: Medicare Other | Attending: Internal Medicine | Admitting: Internal Medicine

## 2021-01-26 VITALS — BP 111/60 | HR 88 | Temp 98.3°F | Resp 17 | Ht 59.0 in | Wt 91.7 lb

## 2021-01-26 DIAGNOSIS — Z5111 Encounter for antineoplastic chemotherapy: Secondary | ICD-10-CM

## 2021-01-26 DIAGNOSIS — C3411 Malignant neoplasm of upper lobe, right bronchus or lung: Secondary | ICD-10-CM | POA: Insufficient documentation

## 2021-01-26 DIAGNOSIS — C3490 Malignant neoplasm of unspecified part of unspecified bronchus or lung: Secondary | ICD-10-CM | POA: Diagnosis not present

## 2021-01-26 DIAGNOSIS — C3491 Malignant neoplasm of unspecified part of right bronchus or lung: Secondary | ICD-10-CM | POA: Diagnosis not present

## 2021-01-26 LAB — CBC WITH DIFFERENTIAL (CANCER CENTER ONLY)
Abs Immature Granulocytes: 0.02 10*3/uL (ref 0.00–0.07)
Basophils Absolute: 0 10*3/uL (ref 0.0–0.1)
Basophils Relative: 1 %
Eosinophils Absolute: 0.1 10*3/uL (ref 0.0–0.5)
Eosinophils Relative: 2 %
HCT: 41.3 % (ref 36.0–46.0)
Hemoglobin: 13.8 g/dL (ref 12.0–15.0)
Immature Granulocytes: 0 %
Lymphocytes Relative: 19 %
Lymphs Abs: 0.9 10*3/uL (ref 0.7–4.0)
MCH: 30.5 pg (ref 26.0–34.0)
MCHC: 33.4 g/dL (ref 30.0–36.0)
MCV: 91.4 fL (ref 80.0–100.0)
Monocytes Absolute: 0.6 10*3/uL (ref 0.1–1.0)
Monocytes Relative: 11 %
Neutro Abs: 3.4 10*3/uL (ref 1.7–7.7)
Neutrophils Relative %: 67 %
Platelet Count: 165 10*3/uL (ref 150–400)
RBC: 4.52 MIL/uL (ref 3.87–5.11)
RDW: 12.6 % (ref 11.5–15.5)
WBC Count: 5 10*3/uL (ref 4.0–10.5)
nRBC: 0 % (ref 0.0–0.2)

## 2021-01-26 LAB — CMP (CANCER CENTER ONLY)
ALT: 21 U/L (ref 0–44)
AST: 24 U/L (ref 15–41)
Albumin: 4 g/dL (ref 3.5–5.0)
Alkaline Phosphatase: 57 U/L (ref 38–126)
Anion gap: 10 (ref 5–15)
BUN: 19 mg/dL (ref 8–23)
CO2: 29 mmol/L (ref 22–32)
Calcium: 9.5 mg/dL (ref 8.9–10.3)
Chloride: 102 mmol/L (ref 98–111)
Creatinine: 1.03 mg/dL — ABNORMAL HIGH (ref 0.44–1.00)
GFR, Estimated: 58 mL/min — ABNORMAL LOW (ref >60)
Glucose, Bld: 64 mg/dL — ABNORMAL LOW (ref 70–99)
Potassium: 3.9 mmol/L (ref 3.5–5.1)
Sodium: 141 mmol/L (ref 135–145)
Total Bilirubin: 0.3 mg/dL (ref 0.3–1.2)
Total Protein: 6.5 g/dL (ref 6.5–8.1)

## 2021-01-26 NOTE — Progress Notes (Signed)
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  PRIOR THERAPY: 1) Right VATS with right upper lobectomy with bronchoplasty closure and en bloc resection of a wedge from the superior segment of the right lower lobe in addition to mediastinal lymph node dissection on 07/30/2015 under the care of Dr. Roxan Hockey. 2) Adjuvant systemic chemotherapy with cisplatin 75 MG/M2 and Alimta 500 MG/M2 every 3 weeks. First dose was given on 09/10/2015. Status post 4 cycles. 3) adjuvant radiotherapy under the care of Dr. Tammi Klippel.  CURRENT THERAPY: Tagrisso 80 mg p.o. daily started 12/01/2018.  Status post 26 months of treatment.  INTERVAL HISTORY: [name] West 70 y.o. female returns to the clinic today for follow-up visit.  The patient is feeling fine today with no concerning complaints except for intermittent tickling in her throat with mild cough.  She denied having any chest pain, shortness of breath or hemoptysis.  She denied having any fever or chills.  She has no nausea, vomiting, diarrhea or constipation.  She has no itching.  She continues to tolerate her treatment with Tagrisso fairly well.  She is here today for evaluation and repeat blood work.   MEDICAL HISTORY: Past Medical History:  Diagnosis Date   Anxiety    Arthritis    shoulders   Cancer ([place]) 06/2014   Lung Cancer   Complication of anesthesia    nausea, does well with PROPOFOL    Cough 09/17/2015   Encounter for antineoplastic chemotherapy 10/22/2015   Environmental and seasonal allergies    Family history  of adverse reaction to anesthesia    Patients grandmother died while having hand surgery; pt unsure of cause   Oral thrush 10/22/2015   Ovarian cyst    PONV (postoperative nausea and vomiting)    Raynaud disease     ALLERGIES:  is allergic to avelox [moxifloxacin hcl in nacl], cephalexin, cephalosporins, ciprofloxacin hcl, clindamycin/lincomycin, penicillins, shrimp [shellfish allergy], macrobid [nitrofurantoin monohyd macro], omnipaque [iohexol], azithromycin, fluconazole, adhesive [tape], lidoderm [lidocaine], pseudoephedrine, and sulfa antibiotics.  MEDICATIONS:  Current Outpatient Medications  Medication Sig Dispense Refill   cholecalciferol (VITAMIN D) 1000 UNITS tablet Take 1,000 Units by mouth daily.     citalopram (CELEXA) 10 MG tablet Take 10 mg by mouth daily.     Clobetasol Prop Oint-Coal Tar (CLOBETAPLUS OINTMENT EX) Apply 1 Dose topically daily.     EPINEPHrine 0.3 mg/0.3 mL IJ SOAJ injection Inject into the muscle as directed.     ibuprofen (ADVIL,MOTRIN) 200 MG tablet Take 200 mg by mouth every 6 (six) hours as needed (pain).     levocetirizine (XYZAL ALLERGY 24HR) 5 MG tablet 1 tablet in the evening     LORazepam (ATIVAN) 0.5 MG tablet Take 0.5 mg by mouth at bedtime as needed for anxiety or sleep.      Multiple Vitamin (MULTIVITAMIN WITH MINERALS) TABS Take 1 tablet by mouth daily.     NONFORMULARY OR COMPOUNDED ITEM Place 1 application vaginally 2 (two) times a week. Estradiol 0.02% Cream     osimertinib mesylate (TAGRISSO) 80 MG tablet TAKE 1 TABLET (80 MG TOTAL) BY MOUTH DAILY. 30 tablet  2   predniSONE (DELTASONE) 50 MG tablet Take one tablet 13 hours , one tablet 7 hours and 1 tablet 1 hours prior to CT scan for CT dye allergy. 3 tablet 3   Probiotic Product (ALIGN) 4 MG CAPS Take 1 capsule by mouth daily.     Wheat Dextrin (BENEFIBER) POWD See admin instructions.     No current facility-administered medications for this visit.    SURGICAL HISTORY:  Past Surgical  History:  Procedure Laterality Date   BREAST EXCISIONAL BIOPSY Left    [place]   COLONOSCOPY WITH PROPOFOL N/A 09/27/2012   Procedure: COLONOSCOPY WITH PROPOFOL;  Surgeon: Garlan Fair, MD;  Location: [facility];  Service: Endoscopy;  Laterality: N/A;   Fibroid Removed     LAPAROTOMY Left 07/20/2016   Procedure: MINI LAPAROTOMY, REMOVAL OF LEFT OVARIAN CYST, LEFT SALPINGO-OOPHORECTOMY;  Surgeon: Dian Queen, MD;  Location: [facility];  Service: Gynecology;  Laterality: Left;  please moved to 7:30am if spot opens   Right Rotator Cuff Right    TOTAL SHOULDER ARTHROPLASTY Left 06/14/2018   Procedure: TOTAL SHOULDER ARTHROPLASTY;  Surgeon: Justice Britain, MD;  Location: [facility];  Service: Orthopedics;  Laterality: Left;  15mn   VIDEO ASSISTED THORACOSCOPY (VATS)/ LOBECTOMY Right 07/30/2015   Procedure: RIGHT VIDEO ASSISTED THORACOSCOPY (VATS)/RIGHT UPPER LOBECTOMY;  Surgeon: SMelrose Nakayama MD;  [facility]  Service: Thoracic;  Laterality: Right;   VIDEO BRONCHOSCOPY WITH ENDOBRONCHIAL NAVIGATION N/A 07/15/2015   Procedure: VIDEO BRONCHOSCOPY WITH ENDOBRONCHIAL NAVIGATION with Biopsy;  Surgeon: RCollene Gobble MD;  Location: [facility]  Service: Thoracic;  Laterality: N/A;    REVIEW OF SYSTEMS:  A comprehensive review of systems was negative except for: Respiratory: positive for cough   PHYSICAL EXAMINATION: General appearance: alert, cooperative, and no distress Head: Normocephalic, without obvious abnormality, atraumatic Neck: no adenopathy, no JVD, supple, symmetrical, trachea midline, and thyroid not enlarged, symmetric, no tenderness/mass/nodules Lymph nodes: Cervical, supraclavicular, and axillary nodes normal. Resp: clear to auscultation bilaterally Back: symmetric, no curvature. ROM normal. No CVA tenderness. Cardio: regular rate and rhythm, S1, S2 normal, no murmur, click, rub or gallop GI: soft, non-tender; bowel sounds normal; no masses,  no  organomegaly Extremities: extremities normal, atraumatic, no cyanosis or edema  ECOG PERFORMANCE STATUS: 0 - Asymptomatic  Blood pressure 111/60, pulse 88, temperature 98.3 F (36.8 C), temperature source Oral, resp. rate 17, height 4' 11"  (1.499 m), weight 91 lb 11.2 oz (41.6 kg), SpO2 100 %.   LABORATORY DATA: Lab Results  Component Value Date   WBC 5.0 01/26/2021   HGB 13.8 01/26/2021   HCT 41.3 01/26/2021   MCV 91.4 01/26/2021   PLT 165 01/26/2021      Chemistry      Component Value Date/Time   NA 135 11/23/2020 1019   NA 139 06/16/2017 1132   K 4.6 11/23/2020 1019   K 4.7 06/16/2017 1132   CL 101 11/23/2020 1019   CO2 26 11/23/2020 1019   CO2 28 06/16/2017 1132   BUN 25 (H) 11/23/2020 1019   BUN 19.5 06/16/2017 1132   CREATININE 1.04 (H) 11/23/2020 1019   CREATININE 0.8 06/16/2017 1132      Component Value Date/Time   CALCIUM 9.8 11/23/2020 1019   CALCIUM 10.0 06/16/2017 1132   ALKPHOS 60 11/23/2020 1019   ALKPHOS 68 06/16/2017 1132   AST 21 11/23/2020 1019   AST 21 06/16/2017 1132   ALT 16 11/23/2020 1019   ALT  18 06/16/2017 1132   BILITOT 0.4 11/23/2020 1019   BILITOT 0.50 06/16/2017 1132       RADIOGRAPHIC STUDIES: No results found.  ASSESSMENT AND PLAN:  This is a very pleasant 70 years old white female with stage IIIA non-small cell lung cancer, adenocarcinoma with positive EGFR mutation in exon 19 status post right upper lobectomy with lymph node dissection followed by adjuvant systemic chemotherapy as well as adjuvant radiation. The patient has been on observation for almost 3 years now.  She is feeling fine with no concerning complaints.   Unfortunately her scan in June 2020 showed multiple tiny pulmonary nodules scattered throughout both lungs which many of them have increased in size and some new nodule suspected the previous scan.  The patient started treatment with Tagrisso 80 mg p.o. daily status post 26 months of treatment.  The patient  continues to tolerate her treatment with Tagrisso fairly well with no concerning adverse effects. I recommended for her to continue her current treatment with Tagrisso with the same dose. I will see her back for follow-up visit in 2 months for evaluation with repeat CT scan of the chest, abdomen pelvis for restaging of her disease. The patient was advised to call immediately if she has any other concerning symptoms in the interval. The patient voices understanding of current disease status and treatment options and is in agreement with the current care plan. All questions were answered. The patient knows to call the clinic with any problems, questions or concerns. We can certainly see the patient much sooner if necessary.  Disclaimer: This note was dictated with voice recognition software. Similar sounding words can inadvertently be transcribed and may not be corrected upon review.

## 2021-02-01 ENCOUNTER — Encounter: Payer: Self-pay | Admitting: Internal Medicine

## 2021-02-05 ENCOUNTER — Encounter: Payer: Self-pay | Admitting: Internal Medicine

## 2021-02-05 ENCOUNTER — Other Ambulatory Visit (HOSPITAL_COMMUNITY): Payer: Self-pay

## 2021-02-08 ENCOUNTER — Other Ambulatory Visit: Payer: Self-pay | Admitting: Physician Assistant

## 2021-02-08 ENCOUNTER — Other Ambulatory Visit (HOSPITAL_COMMUNITY): Payer: Self-pay

## 2021-02-08 DIAGNOSIS — C3491 Malignant neoplasm of unspecified part of right bronchus or lung: Secondary | ICD-10-CM

## 2021-02-08 MED ORDER — OSIMERTINIB MESYLATE 80 MG PO TABS
ORAL_TABLET | Freq: Every day | ORAL | 2 refills | Status: DC
  Filled 2021-02-08: qty 30, 30d supply, fill #0
  Filled 2021-03-16: qty 30, 30d supply, fill #1
  Filled 2021-04-07: qty 30, 30d supply, fill #2

## 2021-02-09 DIAGNOSIS — L905 Scar conditions and fibrosis of skin: Secondary | ICD-10-CM | POA: Diagnosis not present

## 2021-02-09 DIAGNOSIS — C3491 Malignant neoplasm of unspecified part of right bronchus or lung: Secondary | ICD-10-CM | POA: Diagnosis not present

## 2021-02-09 DIAGNOSIS — M810 Age-related osteoporosis without current pathological fracture: Secondary | ICD-10-CM | POA: Diagnosis not present

## 2021-02-09 DIAGNOSIS — D225 Melanocytic nevi of trunk: Secondary | ICD-10-CM | POA: Diagnosis not present

## 2021-02-09 DIAGNOSIS — I2510 Atherosclerotic heart disease of native coronary artery without angina pectoris: Secondary | ICD-10-CM | POA: Diagnosis not present

## 2021-02-09 DIAGNOSIS — U071 COVID-19: Secondary | ICD-10-CM | POA: Diagnosis not present

## 2021-02-09 DIAGNOSIS — M8580 Other specified disorders of bone density and structure, unspecified site: Secondary | ICD-10-CM | POA: Diagnosis not present

## 2021-02-09 DIAGNOSIS — D485 Neoplasm of uncertain behavior of skin: Secondary | ICD-10-CM | POA: Diagnosis not present

## 2021-02-11 ENCOUNTER — Other Ambulatory Visit (HOSPITAL_COMMUNITY): Payer: Self-pay

## 2021-02-11 DIAGNOSIS — Z20822 Contact with and (suspected) exposure to covid-19: Secondary | ICD-10-CM | POA: Diagnosis not present

## 2021-02-18 DIAGNOSIS — Z23 Encounter for immunization: Secondary | ICD-10-CM | POA: Diagnosis not present

## 2021-02-19 ENCOUNTER — Encounter: Payer: Self-pay | Admitting: Internal Medicine

## 2021-03-02 ENCOUNTER — Ambulatory Visit
Admission: RE | Admit: 2021-03-02 | Discharge: 2021-03-02 | Disposition: A | Discharge status: Home / Self Care | Payer: Medicare Other | Source: Ambulatory Visit | Attending: Family Medicine | Admitting: Family Medicine

## 2021-03-02 ENCOUNTER — Other Ambulatory Visit: Payer: Self-pay

## 2021-03-02 DIAGNOSIS — Z1231 Encounter for screening mammogram for malignant neoplasm of breast: Secondary | ICD-10-CM | POA: Diagnosis not present

## 2021-03-08 ENCOUNTER — Other Ambulatory Visit (HOSPITAL_COMMUNITY): Payer: Self-pay

## 2021-03-09 DIAGNOSIS — D485 Neoplasm of uncertain behavior of skin: Secondary | ICD-10-CM | POA: Diagnosis not present

## 2021-03-09 DIAGNOSIS — D2261 Melanocytic nevi of right upper limb, including shoulder: Secondary | ICD-10-CM | POA: Diagnosis not present

## 2021-03-14 DIAGNOSIS — Z23 Encounter for immunization: Secondary | ICD-10-CM | POA: Diagnosis not present

## 2021-03-16 ENCOUNTER — Other Ambulatory Visit (HOSPITAL_COMMUNITY): Payer: Self-pay

## 2021-03-24 ENCOUNTER — Other Ambulatory Visit (HOSPITAL_COMMUNITY): Payer: Self-pay

## 2021-03-30 ENCOUNTER — Other Ambulatory Visit: Payer: Self-pay

## 2021-03-30 ENCOUNTER — Inpatient Hospital Stay: Payer: Medicare Other | Attending: Internal Medicine

## 2021-03-30 ENCOUNTER — Ambulatory Visit (HOSPITAL_COMMUNITY)
Admission: RE | Admit: 2021-03-30 | Discharge: 2021-03-30 | Disposition: A | Discharge status: Home / Self Care | Payer: Medicare Other | Source: Ambulatory Visit | Attending: Internal Medicine | Admitting: Internal Medicine

## 2021-03-30 DIAGNOSIS — C3411 Malignant neoplasm of upper lobe, right bronchus or lung: Secondary | ICD-10-CM | POA: Insufficient documentation

## 2021-03-30 DIAGNOSIS — Z9221 Personal history of antineoplastic chemotherapy: Secondary | ICD-10-CM | POA: Diagnosis not present

## 2021-03-30 DIAGNOSIS — I700 Atherosclerosis of aorta: Secondary | ICD-10-CM | POA: Diagnosis not present

## 2021-03-30 DIAGNOSIS — Z902 Acquired absence of lung [part of]: Secondary | ICD-10-CM | POA: Insufficient documentation

## 2021-03-30 DIAGNOSIS — Z923 Personal history of irradiation: Secondary | ICD-10-CM | POA: Insufficient documentation

## 2021-03-30 DIAGNOSIS — C3490 Malignant neoplasm of unspecified part of unspecified bronchus or lung: Secondary | ICD-10-CM | POA: Insufficient documentation

## 2021-03-30 DIAGNOSIS — I2510 Atherosclerotic heart disease of native coronary artery without angina pectoris: Secondary | ICD-10-CM | POA: Diagnosis not present

## 2021-03-30 DIAGNOSIS — R918 Other nonspecific abnormal finding of lung field: Secondary | ICD-10-CM | POA: Diagnosis not present

## 2021-03-30 DIAGNOSIS — Z9889 Other specified postprocedural states: Secondary | ICD-10-CM | POA: Diagnosis not present

## 2021-03-30 DIAGNOSIS — R911 Solitary pulmonary nodule: Secondary | ICD-10-CM | POA: Diagnosis not present

## 2021-03-30 LAB — CMP (CANCER CENTER ONLY)
ALT: 21 U/L (ref 0–44)
AST: 26 U/L (ref 15–41)
Albumin: 4.5 g/dL (ref 3.5–5.0)
Alkaline Phosphatase: 68 U/L (ref 38–126)
Anion gap: 12 (ref 5–15)
BUN: 25 mg/dL — ABNORMAL HIGH (ref 8–23)
CO2: 25 mmol/L (ref 22–32)
Calcium: 10 mg/dL (ref 8.9–10.3)
Chloride: 99 mmol/L (ref 98–111)
Creatinine: 1.09 mg/dL — ABNORMAL HIGH (ref 0.44–1.00)
GFR, Estimated: 54 mL/min — ABNORMAL LOW (ref >60)
Glucose, Bld: 253 mg/dL — ABNORMAL HIGH (ref 70–99)
Potassium: 4.4 mmol/L (ref 3.5–5.1)
Sodium: 136 mmol/L (ref 135–145)
Total Bilirubin: 0.5 mg/dL (ref 0.3–1.2)
Total Protein: 7.6 g/dL (ref 6.5–8.1)

## 2021-03-30 LAB — CBC WITH DIFFERENTIAL (CANCER CENTER ONLY)
Abs Immature Granulocytes: 0.04 10*3/uL (ref 0.00–0.07)
Basophils Absolute: 0 10*3/uL (ref 0.0–0.1)
Basophils Relative: 0 %
Eosinophils Absolute: 0 10*3/uL (ref 0.0–0.5)
Eosinophils Relative: 0 %
HCT: 42.7 % (ref 36.0–46.0)
Hemoglobin: 14.5 g/dL (ref 12.0–15.0)
Immature Granulocytes: 0 %
Lymphocytes Relative: 7 %
Lymphs Abs: 0.7 10*3/uL (ref 0.7–4.0)
MCH: 30.5 pg (ref 26.0–34.0)
MCHC: 34 g/dL (ref 30.0–36.0)
MCV: 89.7 fL (ref 80.0–100.0)
Monocytes Absolute: 0.1 10*3/uL (ref 0.1–1.0)
Monocytes Relative: 1 %
Neutro Abs: 8.3 10*3/uL — ABNORMAL HIGH (ref 1.7–7.7)
Neutrophils Relative %: 92 %
Platelet Count: 222 10*3/uL (ref 150–400)
RBC: 4.76 MIL/uL (ref 3.87–5.11)
RDW: 12.8 % (ref 11.5–15.5)
WBC Count: 9.2 10*3/uL (ref 4.0–10.5)
nRBC: 0 % (ref 0.0–0.2)

## 2021-03-30 MED ORDER — IOHEXOL 9 MG/ML PO SOLN
ORAL | Status: AC
  Filled 2021-03-30: qty 1000

## 2021-03-30 MED ORDER — IOHEXOL 350 MG/ML SOLN
60.0000 mL | Freq: Once | INTRAVENOUS | Status: AC | PRN
  Administered 2021-03-30: 60 mL via INTRAVENOUS

## 2021-03-31 ENCOUNTER — Inpatient Hospital Stay (HOSPITAL_BASED_OUTPATIENT_CLINIC_OR_DEPARTMENT_OTHER): Payer: Medicare Other | Admitting: Internal Medicine

## 2021-03-31 ENCOUNTER — Encounter: Payer: Self-pay | Admitting: *Deleted

## 2021-03-31 VITALS — BP 139/74 | HR 96 | Temp 97.6°F | Resp 18 | Ht 59.0 in | Wt 92.4 lb

## 2021-03-31 DIAGNOSIS — Z923 Personal history of irradiation: Secondary | ICD-10-CM | POA: Diagnosis not present

## 2021-03-31 DIAGNOSIS — Z9221 Personal history of antineoplastic chemotherapy: Secondary | ICD-10-CM

## 2021-03-31 DIAGNOSIS — C3491 Malignant neoplasm of unspecified part of right bronchus or lung: Secondary | ICD-10-CM | POA: Diagnosis not present

## 2021-03-31 DIAGNOSIS — Z902 Acquired absence of lung [part of]: Secondary | ICD-10-CM | POA: Diagnosis not present

## 2021-03-31 DIAGNOSIS — R918 Other nonspecific abnormal finding of lung field: Secondary | ICD-10-CM | POA: Diagnosis not present

## 2021-03-31 DIAGNOSIS — C3411 Malignant neoplasm of upper lobe, right bronchus or lung: Secondary | ICD-10-CM | POA: Diagnosis not present

## 2021-03-31 DIAGNOSIS — Z5111 Encounter for antineoplastic chemotherapy: Secondary | ICD-10-CM

## 2021-03-31 NOTE — Progress Notes (Signed)
I spoke to [name] West and her husband today at the end of her visit with Dr. Julien Nordmann.  She looks great and has no complaints at this time.  She updated me that according to Dr. Julien Nordmann her scan looks good.  We were able to celebrate those results.  We did talk about lung cancer awareness and different initiatives she is working on. It was great seeing her today.

## 2021-03-31 NOTE — Progress Notes (Signed)
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  PRIOR THERAPY: 1) Right VATS with right upper lobectomy with bronchoplasty closure and en bloc resection of a wedge from the superior segment of the right lower lobe in addition to mediastinal lymph node dissection on 07/30/2015 under the care of Dr. Roxan Hockey. 2) Adjuvant systemic chemotherapy with cisplatin 75 MG/M2 and Alimta 500 MG/M2 every 3 weeks. First dose was given on 09/10/2015. Status post 4 cycles. 3) adjuvant radiotherapy under the care of Dr. Tammi Klippel.  CURRENT THERAPY: Tagrisso 80 mg p.o. daily started 12/01/2018.  Status post 28 months of treatment.  INTERVAL HISTORY: [name] West 70 y.o. female returns to the clinic today for follow-up visit accompanied by her husband.  The patient is feeling fine today with no concerning complaints.  She denied having any significant chest pain, shortness of breath, cough or hemoptysis.  She has no skin rash or diarrhea.  She has no nausea, vomiting, abdominal pain or constipation.  She has no headache or visual changes.  She has no recent weight loss or night sweats.  She has been tolerating her treatment with Tagrisso fairly well.  The patient is here today for evaluation with repeat CT scan of the chest, abdomen and pelvis for restaging of her disease.   MEDICAL HISTORY: Past Medical History:  Diagnosis Date   Anxiety    Arthritis    shoulders   Cancer ([place]) 06/2014   Lung Cancer   Complication of anesthesia    nausea, does well with PROPOFOL    Cough  09/17/2015   Encounter for antineoplastic chemotherapy 10/22/2015   Environmental and seasonal allergies    Family history of adverse reaction to anesthesia    Patients grandmother died while having hand surgery; pt unsure of cause   Oral thrush 10/22/2015   Ovarian cyst    PONV (postoperative nausea and vomiting)    Raynaud disease     ALLERGIES:  is allergic to avelox [moxifloxacin hcl in nacl], cephalexin, cephalosporins, ciprofloxacin hcl, clindamycin/lincomycin, penicillins, shrimp [shellfish allergy], macrobid [nitrofurantoin monohyd macro], omnipaque [iohexol], azithromycin, fluconazole, adhesive [tape], lidoderm [lidocaine], pseudoephedrine, and sulfa antibiotics.  MEDICATIONS:  Current Outpatient Medications  Medication Sig Dispense Refill   cholecalciferol (VITAMIN D) 1000 UNITS tablet Take 1,000 Units by mouth daily.     citalopram (CELEXA) 10 MG tablet Take 10 mg by mouth daily.     Clobetasol Prop Oint-Coal Tar (CLOBETAPLUS OINTMENT EX) Apply 1 Dose topically daily.     EPINEPHrine 0.3 mg/0.3 mL IJ SOAJ injection Inject into the muscle as directed.     ibuprofen (ADVIL,MOTRIN) 200 MG tablet Take 200 mg by mouth every 6 (six) hours as needed (pain).     levocetirizine (XYZAL ALLERGY 24HR) 5 MG tablet 1 tablet in the evening     LORazepam (ATIVAN) 0.5 MG tablet Take 0.5 mg by mouth at bedtime as needed for anxiety or sleep.      Multiple Vitamin (MULTIVITAMIN WITH MINERALS) TABS Take 1 tablet by mouth daily.     NONFORMULARY OR COMPOUNDED ITEM Place 1 application vaginally 2 (two) times a week. Estradiol 0.02%  Cream     osimertinib mesylate (TAGRISSO) 80 MG tablet TAKE 1 TABLET (80 MG TOTAL) BY MOUTH DAILY. 30 tablet 2   predniSONE (DELTASONE) 50 MG tablet Take one tablet 13 hours , one tablet 7 hours and 1 tablet 1 hours prior to CT scan for CT dye allergy. 3 tablet 3   Probiotic Product (ALIGN) 4 MG CAPS Take 1 capsule by mouth daily.     Wheat Dextrin (BENEFIBER) POWD See admin  instructions.     No current facility-administered medications for this visit.    SURGICAL HISTORY:  Past Surgical History:  Procedure Laterality Date   BREAST EXCISIONAL BIOPSY Left    [place]   COLONOSCOPY WITH PROPOFOL N/A 09/27/2012   Procedure: COLONOSCOPY WITH PROPOFOL;  Surgeon: Garlan Fair, MD;  [facility]: [facility];  Service: Endoscopy;  Laterality: N/A;   Fibroid Removed     LAPAROTOMY Left 07/20/2016   Procedure: MINI LAPAROTOMY, REMOVAL OF LEFT OVARIAN CYST, LEFT SALPINGO-OOPHORECTOMY;  Surgeon: Dian Queen, MD;  Location: [facility];  Service: Gynecology;  Laterality: Left;  please moved to 7:30am if spot opens   Right Rotator Cuff Right    TOTAL SHOULDER ARTHROPLASTY Left 06/14/2018   Procedure: TOTAL SHOULDER ARTHROPLASTY;  Surgeon: Justice Britain, MD;  Location: [facility];  Service: Orthopedics;  Laterality: Left;  163mn   VIDEO ASSISTED THORACOSCOPY (VATS)/ LOBECTOMY Right 07/30/2015   Procedure: RIGHT VIDEO ASSISTED THORACOSCOPY (VATS)/RIGHT UPPER LOBECTOMY;  Surgeon: SMelrose Nakayama MD;  Location[facility]  Service: Thoracic;  Laterality: Right;   VIDEO BRONCHOSCOPY WITH ENDOBRONCHIAL NAVIGATION N/A 07/15/2015   Procedure: VIDEO BRONCHOSCOPY WITH ENDOBRONCHIAL NAVIGATION with Biopsy;  Surgeon: RCollene Gobble MD;  Location[facility]  Service: Thoracic;  Laterality: N/A;    REVIEW OF SYSTEMS:  Constitutional: negative Eyes: negative Ears, nose, mouth, throat, and face: negative Respiratory: negative Cardiovascular: negative Gastrointestinal: negative Genitourinary:negative Integument/breast: negative Hematologic/lymphatic: negative Musculoskeletal:negative Neurological: negative Behavioral/Psych: negative Endocrine: negative Allergic/Immunologic: negative   PHYSICAL EXAMINATION: General appearance: alert, cooperative, and no distress Head: Normocephalic, without obvious abnormality, atraumatic Neck: no adenopathy, no JVD, supple,  symmetrical, trachea midline, and thyroid not enlarged, symmetric, no tenderness/mass/nodules Lymph nodes: Cervical, supraclavicular, and axillary nodes normal. Resp: clear to auscultation bilaterally Back: symmetric, no curvature. ROM normal. No CVA tenderness. Cardio: regular rate and rhythm, S1, S2 normal, no murmur, click, rub or gallop GI: soft, non-tender; bowel sounds normal; no masses,  no organomegaly Extremities: extremities normal, atraumatic, no cyanosis or edema Neurologic: Alert and oriented X 3, normal strength and tone. Normal symmetric reflexes. Normal coordination and gait  ECOG PERFORMANCE STATUS: 0 - Asymptomatic  Blood pressure 139/74, pulse 96, temperature 97.6 F (36.4 C), temperature source Tympanic, resp. rate 18, height _0  (1.499 m), weight 92 lb 6.4 oz (41.9 kg), SpO2 100 %.   LABORATORY DATA: Lab Results  Component Value Date   WBC 9.2 03/30/2021   HGB 14.5 03/30/2021   HCT 42.7 03/30/2021   MCV 89.7 03/30/2021   PLT 222 03/30/2021      Chemistry      Component Value Date/Time   NA 136 03/30/2021 0933   NA 139 06/16/2017 1132   K 4.4 03/30/2021 0933   K 4.7 06/16/2017 1132   CL 99 03/30/2021 0933   CO2 25 03/30/2021 0933   CO2 28 06/16/2017 1132   BUN 25 (H) 03/30/2021 0933   BUN 19.5 06/16/2017 1132   CREATININE 1.09 (H) 03/30/2021 06503  CREATININE 0.8 06/16/2017 1132      Component Value Date/Time   CALCIUM 10.0 03/30/2021 0933   CALCIUM 10.0 06/16/2017 1132   ALKPHOS 68 03/30/2021 0933   ALKPHOS 68 06/16/2017 1132   AST 26 03/30/2021 0933   AST 21 06/16/2017 1132   ALT 21 03/30/2021 0933   ALT 18 06/16/2017 1132   BILITOT 0.5 03/30/2021 0933   BILITOT 0.50 06/16/2017 1132       RADIOGRAPHIC STUDIES: MM 3D SCREEN BREAST BILATERAL  Result Date: 03/08/2021 CLINICAL DATA:  Screening. EXAM: DIGITAL SCREENING BILATERAL MAMMOGRAM WITH TOMOSYNTHESIS AND CAD TECHNIQUE: Bilateral screening digital craniocaudal and mediolateral oblique  mammograms were obtained. Bilateral screening digital breast tomosynthesis was performed. The images were evaluated with computer-aided detection. COMPARISON:  Previous exam(s). ACR Breast Density Category d: The breast tissue is extremely dense, which lowers the sensitivity of mammography FINDINGS: There are no findings suspicious for malignancy. IMPRESSION: No mammographic evidence of malignancy. A result letter of this screening mammogram will be mailed directly to the patient. RECOMMENDATION: Screening mammogram in one year. (Code:SM-B-01Y) BI-RADS CATEGORY  1: Negative. Electronically Signed   By: Claudie Revering M.D.   On: 03/08/2021 12:14    ASSESSMENT AND PLAN:  This is a very pleasant 70 years old white female with recurrent non-small cell lung cancer initially diagnosed as stage IIIA non-small cell lung cancer, adenocarcinoma with positive EGFR mutation in exon 19 status post right upper lobectomy with lymph node dissection followed by adjuvant systemic chemotherapy as well as adjuvant radiation. The patient has been on observation for almost 3 years now.  She is feeling fine with no concerning complaints.   Unfortunately her scan in June 2020 showed multiple tiny pulmonary nodules scattered throughout both lungs which many of them have increased in size and some new nodule suspected the previous scan.  The patient started treatment with Tagrisso 80 mg p.o. daily status post 28 months of treatment.  The patient continues to tolerate her treatment with Tagrisso fairly well with no concerning adverse effects. The patient has been tolerating her treatment with Tagrisso fairly well with no significant adverse effects. She had repeat CT scan of the chest, abdomen pelvis performed yesterday.  I personally and independently reviewed the scan images and discussed the results with the patient today.  I did not see any clear evidence for disease progression on reviewing the scan but I will wait for the final  report for confirmation. I recommended for the patient to continue her current treatment with Tagrisso with the same dose. I will see her back for follow-up visit in 2 months for evaluation and repeat blood work. The patient was advised to call immediately if she has any concerning symptoms in the interval.  The patient voices understanding of current disease status and treatment options and is in agreement with the current care plan. All questions were answered. The patient knows to call the clinic with any problems, questions or concerns. We can certainly see the patient much sooner if necessary.  Disclaimer: This note was dictated with voice recognition software. Similar sounding words can inadvertently be transcribed and may not be corrected upon review.

## 2021-04-07 ENCOUNTER — Other Ambulatory Visit (HOSPITAL_COMMUNITY): Payer: Self-pay

## 2021-04-15 ENCOUNTER — Other Ambulatory Visit (HOSPITAL_COMMUNITY): Payer: Self-pay

## 2021-05-11 ENCOUNTER — Other Ambulatory Visit: Payer: Self-pay | Admitting: Physician Assistant

## 2021-05-11 ENCOUNTER — Other Ambulatory Visit (HOSPITAL_COMMUNITY): Payer: Self-pay

## 2021-05-11 DIAGNOSIS — C3491 Malignant neoplasm of unspecified part of right bronchus or lung: Secondary | ICD-10-CM

## 2021-05-11 MED ORDER — OSIMERTINIB MESYLATE 80 MG PO TABS
ORAL_TABLET | Freq: Every day | ORAL | 2 refills | Status: DC
  Filled 2021-05-11: qty 30, 30d supply, fill #0
  Filled 2021-06-10: qty 30, 30d supply, fill #1
  Filled 2021-07-08: qty 30, 30d supply, fill #2

## 2021-05-13 ENCOUNTER — Telehealth: Payer: Self-pay | Admitting: Internal Medicine

## 2021-05-13 ENCOUNTER — Other Ambulatory Visit (HOSPITAL_COMMUNITY): Payer: Self-pay

## 2021-05-13 DIAGNOSIS — E782 Mixed hyperlipidemia: Secondary | ICD-10-CM

## 2021-05-13 NOTE — Telephone Encounter (Signed)
NMR lipoprofile ordered/mailed -- to be completed before next visit with Dr. Debara Pickett

## 2021-05-24 DIAGNOSIS — U071 COVID-19: Secondary | ICD-10-CM | POA: Diagnosis not present

## 2021-05-26 DIAGNOSIS — Z1389 Encounter for screening for other disorder: Secondary | ICD-10-CM | POA: Diagnosis not present

## 2021-05-26 DIAGNOSIS — Z0000 Encounter for general adult medical examination without abnormal findings: Secondary | ICD-10-CM | POA: Diagnosis not present

## 2021-05-26 DIAGNOSIS — Z131 Encounter for screening for diabetes mellitus: Secondary | ICD-10-CM | POA: Diagnosis not present

## 2021-05-26 DIAGNOSIS — M810 Age-related osteoporosis without current pathological fracture: Secondary | ICD-10-CM | POA: Diagnosis not present

## 2021-05-31 ENCOUNTER — Inpatient Hospital Stay: Payer: Medicare Other

## 2021-05-31 ENCOUNTER — Inpatient Hospital Stay: Payer: Medicare Other | Attending: Internal Medicine | Admitting: Internal Medicine

## 2021-05-31 ENCOUNTER — Other Ambulatory Visit: Payer: Self-pay

## 2021-05-31 VITALS — BP 114/64 | HR 80 | Temp 97.6°F | Resp 18 | Ht 59.0 in | Wt 92.3 lb

## 2021-05-31 DIAGNOSIS — C3490 Malignant neoplasm of unspecified part of unspecified bronchus or lung: Secondary | ICD-10-CM

## 2021-05-31 DIAGNOSIS — C3411 Malignant neoplasm of upper lobe, right bronchus or lung: Secondary | ICD-10-CM | POA: Insufficient documentation

## 2021-05-31 DIAGNOSIS — Z79899 Other long term (current) drug therapy: Secondary | ICD-10-CM | POA: Insufficient documentation

## 2021-05-31 DIAGNOSIS — Z5111 Encounter for antineoplastic chemotherapy: Secondary | ICD-10-CM

## 2021-05-31 DIAGNOSIS — C3491 Malignant neoplasm of unspecified part of right bronchus or lung: Secondary | ICD-10-CM | POA: Diagnosis not present

## 2021-05-31 LAB — CBC WITH DIFFERENTIAL (CANCER CENTER ONLY)
Abs Immature Granulocytes: 0.01 10*3/uL (ref 0.00–0.07)
Basophils Absolute: 0 10*3/uL (ref 0.0–0.1)
Basophils Relative: 1 %
Eosinophils Absolute: 0.1 10*3/uL (ref 0.0–0.5)
Eosinophils Relative: 1 %
HCT: 41.9 % (ref 36.0–46.0)
Hemoglobin: 14 g/dL (ref 12.0–15.0)
Immature Granulocytes: 0 %
Lymphocytes Relative: 18 %
Lymphs Abs: 1 10*3/uL (ref 0.7–4.0)
MCH: 30.6 pg (ref 26.0–34.0)
MCHC: 33.4 g/dL (ref 30.0–36.0)
MCV: 91.7 fL (ref 80.0–100.0)
Monocytes Absolute: 0.8 10*3/uL (ref 0.1–1.0)
Monocytes Relative: 14 %
Neutro Abs: 3.6 10*3/uL (ref 1.7–7.7)
Neutrophils Relative %: 66 %
Platelet Count: 180 10*3/uL (ref 150–400)
RBC: 4.57 MIL/uL (ref 3.87–5.11)
RDW: 12.9 % (ref 11.5–15.5)
WBC Count: 5.5 10*3/uL (ref 4.0–10.5)
nRBC: 0 % (ref 0.0–0.2)

## 2021-05-31 LAB — CMP (CANCER CENTER ONLY)
ALT: 16 U/L (ref 0–44)
AST: 23 U/L (ref 15–41)
Albumin: 4.1 g/dL (ref 3.5–5.0)
Alkaline Phosphatase: 53 U/L (ref 38–126)
Anion gap: 9 (ref 5–15)
BUN: 19 mg/dL (ref 8–23)
CO2: 29 mmol/L (ref 22–32)
Calcium: 9.3 mg/dL (ref 8.9–10.3)
Chloride: 103 mmol/L (ref 98–111)
Creatinine: 1.05 mg/dL — ABNORMAL HIGH (ref 0.44–1.00)
GFR, Estimated: 57 mL/min — ABNORMAL LOW (ref >60)
Glucose, Bld: 79 mg/dL (ref 70–99)
Potassium: 4.8 mmol/L (ref 3.5–5.1)
Sodium: 141 mmol/L (ref 135–145)
Total Bilirubin: 0.4 mg/dL (ref 0.3–1.2)
Total Protein: 6.5 g/dL (ref 6.5–8.1)

## 2021-05-31 NOTE — Progress Notes (Signed)
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  PRIOR THERAPY: 1) Right VATS with right upper lobectomy with bronchoplasty closure and en bloc resection of a wedge from the superior segment of the right lower lobe in addition to mediastinal lymph node dissection on 07/30/2015 under the care of Dr. Roxan Hockey. 2) Adjuvant systemic chemotherapy with cisplatin 75 MG/M2 and Alimta 500 MG/M2 every 3 weeks. First dose was given on 09/10/2015. Status post 4 cycles. 3) adjuvant radiotherapy under the care of Dr. Tammi Klippel.  CURRENT THERAPY: Tagrisso 80 mg p.o. daily started 12/01/2018.  Status post 28 months of treatment.  INTERVAL HISTORY: [name] West 70 y.o. female returns to the clinic today for follow-up visit.  The patient is feeling fine today with no concerning complaints.  She denied having any current chest pain, shortness of breath, cough or hemoptysis.  She denied having any nausea, vomiting, diarrhea or constipation.  She has no headache or visual changes.  She has no significant weight loss or night sweats.  She continues to have some episodes of hypoglycemia and she will be seen by her primary care physician for recommendation regarding this condition.  The patient is here today for evaluation and repeat blood work.   MEDICAL HISTORY: Past Medical History:  Diagnosis Date   Anxiety    Arthritis    shoulders   Cancer ([place]) 06/2014   Lung Cancer   Complication of anesthesia    nausea, does well with PROPOFOL    Cough 09/17/2015   Encounter for  antineoplastic chemotherapy 10/22/2015   Environmental and seasonal allergies    Family history of adverse reaction to anesthesia    Patients grandmother died while having hand surgery; pt unsure of cause   Oral thrush 10/22/2015   Ovarian cyst    PONV (postoperative nausea and vomiting)    Raynaud disease     ALLERGIES:  is allergic to avelox [moxifloxacin hcl in nacl], cephalexin, cephalosporins, ciprofloxacin hcl, clindamycin/lincomycin, penicillins, shrimp [shellfish allergy], macrobid [nitrofurantoin monohyd macro], omnipaque [iohexol], azithromycin, fluconazole, adhesive [tape], lidoderm [lidocaine], pseudoephedrine, and sulfa antibiotics.  MEDICATIONS:  Current Outpatient Medications  Medication Sig Dispense Refill   cholecalciferol (VITAMIN D) 1000 UNITS tablet Take 1,000 Units by mouth daily.     citalopram (CELEXA) 10 MG tablet Take 10 mg by mouth daily.     Clobetasol Prop Oint-Coal Tar (CLOBETAPLUS OINTMENT EX) Apply 1 Dose topically daily.     EPINEPHrine 0.3 mg/0.3 mL IJ SOAJ injection Inject into the muscle as directed.     ibuprofen (ADVIL,MOTRIN) 200 MG tablet Take 200 mg by mouth every 6 (six) hours as needed (pain).     levocetirizine (XYZAL) 5 MG tablet 1 tablet in the evening     LORazepam (ATIVAN) 0.5 MG tablet Take 0.5 mg by mouth at bedtime as needed for anxiety or sleep.      Multiple Vitamin (MULTIVITAMIN WITH MINERALS) TABS Take 1 tablet by mouth daily.     NONFORMULARY OR COMPOUNDED ITEM Place 1 application vaginally 2 (two) times a week. Estradiol 0.02% Cream     osimertinib mesylate (TAGRISSO) 80 MG tablet TAKE  1 TABLET (80 MG TOTAL) BY MOUTH DAILY. 30 tablet 2   predniSONE (DELTASONE) 50 MG tablet Take one tablet 13 hours , one tablet 7 hours and 1 tablet 1 hours prior to CT scan for CT dye allergy. 3 tablet 3   Probiotic Product (ALIGN) 4 MG CAPS Take 1 capsule by mouth daily.     Wheat Dextrin (BENEFIBER) POWD See admin instructions.     No current  facility-administered medications for this visit.    SURGICAL HISTORY:  Past Surgical History:  Procedure Laterality Date   BREAST EXCISIONAL BIOPSY Left    [place] [place]   COLONOSCOPY WITH PROPOFOL N/A 09/27/2012   Procedure: COLONOSCOPY WITH PROPOFOL;  Surgeon: Garlan Fair, MD;  Location: [facility];  Service: Endoscopy;  Laterality: N/A;   Fibroid Removed     LAPAROTOMY Left 07/20/2016   Procedure: MINI LAPAROTOMY, REMOVAL OF LEFT OVARIAN CYST, LEFT SALPINGO-OOPHORECTOMY;  Surgeon: Dian Queen, MD;  Location: [facility];  Service: Gynecology;  Laterality: Left;  please moved to 7:30am if spot opens   Right Rotator Cuff Right    TOTAL SHOULDER ARTHROPLASTY Left 06/14/2018   Procedure: TOTAL SHOULDER ARTHROPLASTY;  Surgeon: Justice Britain, MD;  Location: [facility];  Service: Orthopedics;  Laterality: Left;  136mn   VIDEO ASSISTED THORACOSCOPY (VATS)/ LOBECTOMY Right 07/30/2015   Procedure: RIGHT VIDEO ASSISTED THORACOSCOPY (VATS)/RIGHT UPPER LOBECTOMY;  Surgeon: SMelrose Nakayama MD;  Location: [facility]  Service: Thoracic;  Laterality: Right;   VIDEO BRONCHOSCOPY WITH ENDOBRONCHIAL NAVIGATION N/A 07/15/2015   Procedure: VIDEO BRONCHOSCOPY WITH ENDOBRONCHIAL NAVIGATION with Biopsy;  Surgeon: RCollene Gobble MD;  [facility];  Service: Thoracic;  Laterality: N/A;    REVIEW OF SYSTEMS:  A comprehensive review of systems was negative.   PHYSICAL EXAMINATION: General appearance: alert, cooperative, and no distress Head: Normocephalic, without obvious abnormality, atraumatic Neck: no adenopathy, no JVD, supple, symmetrical, trachea midline, and thyroid not enlarged, symmetric, no tenderness/mass/nodules Lymph nodes: Cervical, supraclavicular, and axillary nodes normal. Resp: clear to auscultation bilaterally Back: symmetric, no curvature. ROM normal. No CVA tenderness. Cardio: regular rate and rhythm, S1, S2 normal, no murmur, click, rub or gallop GI: soft, non-tender;  bowel sounds normal; no masses,  no organomegaly Extremities: extremities normal, atraumatic, no cyanosis or edema  ECOG PERFORMANCE STATUS: 0 - Asymptomatic  Blood pressure 114/64, pulse 80, temperature 97.6 F (36.4 C), temperature source Tympanic, resp. rate 18, height 4' 11"  (1.499 m), weight 92 lb 4.8 oz (41.9 kg), SpO2 98 %.   LABORATORY DATA: Lab Results  Component Value Date   WBC 5.5 05/31/2021   HGB 14.0 05/31/2021   HCT 41.9 05/31/2021   MCV 91.7 05/31/2021   PLT 180 05/31/2021      Chemistry      Component Value Date/Time   NA 136 03/30/2021 0933   NA 139 06/16/2017 1132   K 4.4 03/30/2021 0933   K 4.7 06/16/2017 1132   CL 99 03/30/2021 0933   CO2 25 03/30/2021 0933   CO2 28 06/16/2017 1132   BUN 25 (H) 03/30/2021 0933   BUN 19.5 06/16/2017 1132   CREATININE 1.09 (H) 03/30/2021 0933   CREATININE 0.8 06/16/2017 1132      Component Value Date/Time   CALCIUM 10.0 03/30/2021 0933   CALCIUM 10.0 06/16/2017 1132   ALKPHOS 68 03/30/2021 0933   ALKPHOS 68 06/16/2017 1132   AST 26 03/30/2021 0933   AST 21 06/16/2017 1132   ALT 21 03/30/2021  0933   ALT 18 06/16/2017 1132   BILITOT 0.5 03/30/2021 0933   BILITOT 0.50 06/16/2017 1132       RADIOGRAPHIC STUDIES: No results found.  ASSESSMENT AND PLAN:  This is a very pleasant 70 years old white female with recurrent non-small cell lung cancer initially diagnosed as stage IIIA non-small cell lung cancer, adenocarcinoma with positive EGFR mutation in exon 19 status post right upper lobectomy with lymph node dissection followed by adjuvant systemic chemotherapy as well as adjuvant radiation. The patient has been on observation for almost 3 years now.  She is feeling fine with no concerning complaints.   Unfortunately her scan in June 2020 showed multiple tiny pulmonary nodules scattered throughout both lungs which many of them have increased in size and some new nodule suspected the previous scan.  The patient  started treatment with Tagrisso 80 mg p.o. daily status post 30 months of treatment.   She continues to tolerate this treatment well with no significant adverse effects. CBC today is unremarkable.  Comprehensive metabolic panel is still pending. I recommended for the patient to continue on her treatment with Tagrisso with the same dose 80 mg p.o. daily. I will see her back for follow-up visit in 2 months for evaluation and repeat CT scan of the chest, abdomen and pelvis for restaging of her disease. The patient was advised to call immediately if she has any other concerning symptoms in the interval. The patient voices understanding of current disease status and treatment options and is in agreement with the current care plan. All questions were answered. The patient knows to call the clinic with any problems, questions or concerns. We can certainly see the patient much sooner if necessary.  Disclaimer: This note was dictated with voice recognition software. Similar sounding words can inadvertently be transcribed and may not be corrected upon review.

## 2021-06-01 DIAGNOSIS — L309 Dermatitis, unspecified: Secondary | ICD-10-CM | POA: Diagnosis not present

## 2021-06-01 DIAGNOSIS — J310 Chronic rhinitis: Secondary | ICD-10-CM | POA: Diagnosis not present

## 2021-06-01 DIAGNOSIS — I2510 Atherosclerotic heart disease of native coronary artery without angina pectoris: Secondary | ICD-10-CM | POA: Diagnosis not present

## 2021-06-01 DIAGNOSIS — N952 Postmenopausal atrophic vaginitis: Secondary | ICD-10-CM | POA: Diagnosis not present

## 2021-06-01 DIAGNOSIS — F419 Anxiety disorder, unspecified: Secondary | ICD-10-CM | POA: Diagnosis not present

## 2021-06-01 DIAGNOSIS — M810 Age-related osteoporosis without current pathological fracture: Secondary | ICD-10-CM | POA: Diagnosis not present

## 2021-06-01 DIAGNOSIS — Z91038 Other insect allergy status: Secondary | ICD-10-CM | POA: Diagnosis not present

## 2021-06-01 DIAGNOSIS — K5903 Drug induced constipation: Secondary | ICD-10-CM | POA: Diagnosis not present

## 2021-06-01 DIAGNOSIS — C3491 Malignant neoplasm of unspecified part of right bronchus or lung: Secondary | ICD-10-CM | POA: Diagnosis not present

## 2021-06-10 ENCOUNTER — Other Ambulatory Visit (HOSPITAL_COMMUNITY): Payer: Self-pay

## 2021-06-11 DIAGNOSIS — L03019 Cellulitis of unspecified finger: Secondary | ICD-10-CM | POA: Diagnosis not present

## 2021-06-14 ENCOUNTER — Other Ambulatory Visit (HOSPITAL_COMMUNITY): Payer: Self-pay

## 2021-06-15 ENCOUNTER — Encounter: Payer: Self-pay | Admitting: Internal Medicine

## 2021-06-15 ENCOUNTER — Other Ambulatory Visit (HOSPITAL_COMMUNITY): Payer: Self-pay

## 2021-06-16 ENCOUNTER — Telehealth: Payer: Self-pay | Admitting: Internal Medicine

## 2021-06-16 DIAGNOSIS — L03031 Cellulitis of right toe: Secondary | ICD-10-CM | POA: Diagnosis not present

## 2021-06-16 NOTE — Telephone Encounter (Signed)
Spoke to patient she stated she will be having fasting labs tomorrow.Advised no coffee,no food.Water only.Advised ok if she is taking Doxycycline.

## 2021-06-16 NOTE — Telephone Encounter (Signed)
New Message:    Patient is coming in tomorrow for fasting lab work. She have 2 questions, can she have coffee and since she is on Doxycyline will that be a problem?

## 2021-06-17 ENCOUNTER — Ambulatory Visit: Payer: Medicare Other | Admitting: Internal Medicine

## 2021-06-17 DIAGNOSIS — E782 Mixed hyperlipidemia: Secondary | ICD-10-CM | POA: Diagnosis not present

## 2021-06-18 LAB — NMR, LIPOPROFILE
Cholesterol, Total: 197 mg/dL (ref 100–199)
HDL Particle Number: 32.6 umol/L (ref >=30.5)
HDL-C: 71 mg/dL (ref >39)
LDL Particle Number: 1116 nmol/L — ABNORMAL HIGH (ref <1000)
LDL Size: 21.6 nm (ref >20.5)
LDL-C (NIH Calc): 108 mg/dL — ABNORMAL HIGH (ref 0–99)
LP-IR Score: <25 (ref <=45)
Small LDL Particle Number: <90 nmol/L (ref <=527)
Triglycerides: 101 mg/dL (ref 0–149)

## 2021-06-28 ENCOUNTER — Ambulatory Visit (INDEPENDENT_AMBULATORY_CARE_PROVIDER_SITE_OTHER): Payer: Medicare Other | Admitting: Internal Medicine

## 2021-06-28 ENCOUNTER — Encounter: Payer: Self-pay | Admitting: Internal Medicine

## 2021-06-28 ENCOUNTER — Other Ambulatory Visit: Payer: Self-pay

## 2021-06-28 VITALS — BP 110/72 | HR 86 | Ht 59.5 in | Wt 91.0 lb

## 2021-06-28 DIAGNOSIS — I2510 Atherosclerotic heart disease of native coronary artery without angina pectoris: Secondary | ICD-10-CM | POA: Diagnosis not present

## 2021-06-28 DIAGNOSIS — I2584 Coronary atherosclerosis due to calcified coronary lesion: Secondary | ICD-10-CM

## 2021-06-28 DIAGNOSIS — E785 Hyperlipidemia, unspecified: Secondary | ICD-10-CM | POA: Diagnosis not present

## 2021-06-28 NOTE — Progress Notes (Signed)
OFFICE CONSULT NOTE  Chief Complaint:  Follow-up  Primary Care Physician: [name], MD  HPI:  [name] West is a 71 y.o. female who is being seen today for the evaluation of coronary artery calcification at the request of [name], MD.  This is a pleasant 71 year old female with unfortunate history of non-small cell lung cancer.  She underwent lobectomy by Dr. Roxan Hockey in 2017 with radiation chemotherapy.  Fortunately she seems to be cancer free with some recurrent scans.  Recently she is developed some inguinal lymphadenopathy.  She is on antibiotics for this however her is concerned about a neoplastic process.  Her most recent screening CT scan of the chest did demonstrate coronary artery calcification both in the LAD as well as aortic atherosclerosis which was calcified.  Family history is significant for a brother 55 years older who has coronary artery disease and has had an MI and CABG (however she notes he is live with HIV disease on HAART for more than 30 years).  She denies any history of diabetes or hypertension in fact does not take any daily medications.  Her last lipid profile was November 2018 which showed total cholesterol 210, HDL 81, triglycerides 65 and LDL 116.  She is asymptomatic from an anginal standpoint, but does report shortness of breath with exertion.  She felt like this may mostly be related to her lung cancer and the fact that she has had partial lung removal.  12/22/2017  [name] West returns today for follow-up of her stress echocardiogram.  Fortunately this was a negative study without any exercise-induced wall motion abnormalities and normal LV function.  Overall she feels well except for ongoing shoulder problems.  She is scheduled for sold shoulder surgery in January.  She is supposed to have another CT scan of her chest in December.  We discussed the fact that she does have coronary disease as evidenced by her coronary artery calcifications.   Although her stress test was low risk, her goal LDL is less than 70.  Currently her LDL is 116.  Given her body habitus, thin weight and fairly healthy diet, is unlikely she is going get a significant increased improvement in her numbers.  I did repeat a lipid profile which was an NMR.  Her LDL-C is 110, and her LDL-P is 1151.  Total cholesterol is 219 and LPA was negative at 21.   03/14/2019  [name] West is seen today in follow-up.  She did undergo successful left shoulder replacement.  Overall she is pleased with that.  She is scheduled to have another CT scan in November of her chest for follow-up of cancer.  This had shown coronary calcification and I recommended statin therapy.  She has been somewhat hesitant to do that.  Recently we repeated an NMR lipid profile.  Her total particle number went from 1151 down to 854.  LDL still remains elevated 102 and HDL was 100, triglycerides 57.  03/25/2020  [name] West returns today for annual follow-up.  Overall she continues to do well.  Denies any chest pain or shortness of breath.  She has had no recurrence of her lung cancer as of recent CT scan.  She does have some LAD atherosclerosis.  Her NMR LDL-P was 844, total cholesterol 197, triglycerides 92, HDL 85 and LDL of 96.  Overall representing good control over her lipids.  She is not currently on any therapy.  06/28/2021  [name] West returns today for follow-up.  She reported she had  a viral illness over the holidays and had lost significant amount of weight.  She was actually underweight and weight 85 pounds.  She then intentionally ate a lot of potentially fattening foods in order to get her weight up to now 91 pounds with a BMI of 18.  She thinks this is responsible for an increase in her lipids which were measured on January 5.  Total cholesterol 197, triglycerides 101, HDL 71 and LDL 108.   PMHx:  Past Medical History:  Diagnosis Date   Anxiety    Arthritis    shoulders   Cancer ([place]) 06/2014    Lung Cancer   Complication of anesthesia    nausea, does well with PROPOFOL    Cough 09/17/2015   Encounter for antineoplastic chemotherapy 10/22/2015   Environmental and seasonal allergies    Family history of adverse reaction to anesthesia    Patients grandmother died while having hand surgery; pt unsure of cause   Oral thrush 10/22/2015   Ovarian cyst    PONV (postoperative nausea and vomiting)    Raynaud disease     Past Surgical History:  Procedure Laterality Date   BREAST EXCISIONAL BIOPSY Left    [place]   COLONOSCOPY WITH PROPOFOL N/A 09/27/2012   Procedure: COLONOSCOPY WITH PROPOFOL;  Surgeon: Garlan Fair, MD;  [facility];  Service: Endoscopy;  Laterality: N/A;   Fibroid Removed     LAPAROTOMY Left 07/20/2016   Procedure: MINI LAPAROTOMY, REMOVAL OF LEFT OVARIAN CYST, LEFT SALPINGO-OOPHORECTOMY;  Surgeon: Dian Queen, MD;  Location: [facility];  Service: Gynecology;  Laterality: Left;  please moved to 7:30am if spot opens   Right Rotator Cuff Right    TOTAL SHOULDER ARTHROPLASTY Left 06/14/2018   Procedure: TOTAL SHOULDER ARTHROPLASTY;  Surgeon: Justice Britain, MD;  Location: [facility];  Service: Orthopedics;  Laterality: Left;  134min   VIDEO ASSISTED THORACOSCOPY (VATS)/ LOBECTOMY Right 07/30/2015   Procedure: RIGHT VIDEO ASSISTED THORACOSCOPY (VATS)/RIGHT UPPER LOBECTOMY;  Surgeon: Melrose Nakayama, MD;  Location: [facility];  Service: Thoracic;  Laterality: Right;   VIDEO BRONCHOSCOPY WITH ENDOBRONCHIAL NAVIGATION N/A 07/15/2015   Procedure: VIDEO BRONCHOSCOPY WITH ENDOBRONCHIAL NAVIGATION with Biopsy;  Surgeon: Collene Gobble, MD;  Location: [facility];  Service: Thoracic;  Laterality: N/A;    FAMHx:  Family History  Problem Relation Age of Onset   Heart disease Brother    Heart attack Brother     SOCHx:   reports that she has never smoked. She has never used smokeless tobacco. She reports current alcohol use. She reports that she does not use  drugs.  ALLERGIES:  Allergies  Allergen Reactions   Avelox [Moxifloxacin Hcl In Nacl] Anaphylaxis   Cephalexin Anaphylaxis   Cephalosporins Anaphylaxis   Ciprofloxacin Hcl Shortness Of Breath   Clindamycin/Lincomycin Anaphylaxis   Penicillins Anaphylaxis and Other (See Comments)    Has patient had a PCN reaction causing immediate rash, facial/tongue/throat swelling, SOB or lightheadedness with hypotension: Yes Has patient had a PCN reaction causing severe rash involving mucus membranes or skin necrosis: No Has patient had a PCN reaction that required hospitalization Yes Has patient had a PCN reaction occurring within the last 10 years: No If all of the above answers are "NO", then may proceed with Cephalosporin use.    Shrimp [Shellfish Allergy] Diarrhea    "violent reaction to :shrimp and scallops but not other shell fish-vomiting and diarrhea"   Macrobid [Nitrofurantoin Monohyd Macro] Other (See Comments)  CHEST TIGHTNESS...Marland KitchenHAD USED BEFORE WITH NO REACTION   Omnipaque [Iohexol] Hives   Azithromycin Other (See Comments)   Fluconazole Nausea And Vomiting   Adhesive [Tape] Rash   Lidoderm [Lidocaine] Rash   Pseudoephedrine Palpitations   Sulfa Antibiotics Rash    ROS: Pertinent items noted in HPI and remainder of comprehensive ROS otherwise negative.  HOME MEDS: Current Outpatient Medications on File Prior to Visit  Medication Sig Dispense Refill   cholecalciferol (VITAMIN D) 1000 UNITS tablet Take 1,000 Units by mouth daily.     citalopram (CELEXA) 10 MG tablet Take 10 mg by mouth daily.     Clobetasol Prop Oint-Coal Tar (CLOBETAPLUS OINTMENT EX) Apply 1 Dose topically daily.     EPINEPHrine 0.3 mg/0.3 mL IJ SOAJ injection Inject into the muscle as directed.     ibuprofen (ADVIL,MOTRIN) 200 MG tablet Take 200 mg by mouth every 6 (six) hours as needed (pain).     levocetirizine (XYZAL) 5 MG tablet 1 tablet in the evening     LORazepam (ATIVAN) 0.5 MG tablet Take 0.5 mg by  mouth at bedtime as needed for anxiety or sleep.      Multiple Vitamin (MULTIVITAMIN WITH MINERALS) TABS Take 1 tablet by mouth daily.     NONFORMULARY OR COMPOUNDED ITEM Place 1 application vaginally 2 (two) times a week. Estradiol 0.02% Cream     osimertinib mesylate (TAGRISSO) 80 MG tablet TAKE 1 TABLET (80 MG TOTAL) BY MOUTH DAILY. 30 tablet 2   predniSONE (DELTASONE) 50 MG tablet Take one tablet 13 hours , one tablet 7 hours and 1 tablet 1 hours prior to CT scan for CT dye allergy. 3 tablet 3   Probiotic Product (ALIGN) 4 MG CAPS Take 1 capsule by mouth daily.     Wheat Dextrin (BENEFIBER) POWD See admin instructions.     No current facility-administered medications on file prior to visit.    LABS/IMAGING: No results found for this or any previous visit (from the past 48 hour(s)).  No results found.   LIPID PANEL: No results found for: CHOL, TRIG, HDL, CHOLHDL, VLDL, LDLCALC, LDLDIRECT  WEIGHTS: Wt Readings from Last 3 Encounters:  06/28/21 91 lb (41.3 kg)  05/31/21 92 lb 4.8 oz (41.9 kg)  03/31/21 92 lb 6.4 oz (41.9 kg)    VITALS: BP 110/72    Pulse 86    Ht 4' 11.5" (1.511 m)    Wt 91 lb (41.3 kg)    SpO2 98%    BMI 18.07 kg/m   EXAM: General appearance: alert and no distress Neck: no carotid bruit, no JVD and thyroid not enlarged, symmetric, no tenderness/mass/nodules Lungs: clear to auscultation bilaterally Heart: regular rate and rhythm, S1, S2 normal, no murmur, click, rub or gallop Abdomen: soft, non-tender; bowel sounds normal; no masses,  no organomegaly Extremities: extremities normal, atraumatic, no cyanosis or edema Pulses: 2+ and symmetric Skin: Skin color, texture, turgor normal. No rashes or lesions Neurologic: Grossly normal Psych: Pleasant  EKG: Normal sinus rhythm 86-personally reviewed  ASSESSMENT: Coronary artery calcification-LAD and aortic atherosclerosis (negative stress echocardiogram (03/2018)) History of non-small cell adenocarcinoma  (status post lobectomy-2017) Family history of coronary artery disease Dyslipidemia-goal LDL less than 70  PLAN: 1.   [name] West has had recent increase in her lipids.  She thinks this may have been related to abnormal diet in order to try to gain weight back after an illness over the holidays.  She was noted to have some coronary artery calcification in the LAD  and has regular CT scans for follow-up of lung cancer.  Guidelines recommend statin therapy although she has had a high HDL cholesterol.  I am concerned about her family history of heart disease and still feels she is above her target LDL.  She would like a repeat lipid profile because of her abnormal diet and we will go ahead and arrange that in 3 months.  If she remains above target I would recommend least a low-dose statin to try to get her to target LDL which I would recommend at 70 or lower.  She seems like she would be agreeable to that after discussion about the potential benefits of statin therapy.  Follow-up with me in 6 months or sooner as necessary  Pixie Casino, MD, [facility], [place] Director of the Advanced Lipid Disorders &  Cardiovascular Risk Reduction Clinic Diplomate of the American Board of Clinical Lipidology Attending Cardiologist  Direct Dial: [phone]   Fax: [phone]  Website:  [email or web address]  [name] West 06/28/2021, 8:22 AM

## 2021-06-28 NOTE — Patient Instructions (Signed)
Medication Instructions:  Your physician recommends that you continue on your current medications as directed. Please refer to the Current Medication list given to you today.  *If you need a refill on your cardiac medications before your next appointment, please call your pharmacy*   Lab Work: FASTING lab work to check cholesterol in April 2023  If you have labs (blood work) drawn today and your tests are completely normal, you will receive your results only by: [place] (if you have MyChart) OR A paper copy in the mail If you have any lab test that is abnormal or we need to change your treatment, we will call you to review the results.  Follow-Up: At [facility], you and your health needs are our priority.  As part of our continuing mission to provide you with exceptional heart care, we have created designated Provider Care Teams.  These Care Teams include your primary Cardiologist (physician) and Advanced Practice Providers (APPs -  Physician Assistants and Nurse Practitioners) who all work together to provide you with the care you need, when you need it.  We recommend signing up for the patient portal called "MyChart".  Sign up information is provided on this After Visit Summary.  MyChart is used to connect with patients for Virtual Visits (Telemedicine).  Patients are able to view lab/test results, encounter notes, upcoming appointments, etc.  Non-urgent messages can be sent to your provider as well.   To learn more about what you can do with MyChart, go to [email or web address].    Your next appointment:   6 month(s)  The format for your next appointment:   In Person  Provider:   Pixie Casino, MD {

## 2021-07-07 ENCOUNTER — Ambulatory Visit
Admission: RE | Admit: 2021-07-07 | Discharge: 2021-07-07 | Disposition: A | Discharge status: Home / Self Care | Payer: Medicare Other | Source: Ambulatory Visit | Attending: Family Medicine | Admitting: Family Medicine

## 2021-07-07 DIAGNOSIS — Z780 Asymptomatic menopausal state: Secondary | ICD-10-CM | POA: Diagnosis not present

## 2021-07-07 DIAGNOSIS — M810 Age-related osteoporosis without current pathological fracture: Secondary | ICD-10-CM | POA: Diagnosis not present

## 2021-07-07 DIAGNOSIS — M85851 Other specified disorders of bone density and structure, right thigh: Secondary | ICD-10-CM | POA: Diagnosis not present

## 2021-07-08 ENCOUNTER — Other Ambulatory Visit (HOSPITAL_COMMUNITY): Payer: Self-pay

## 2021-07-09 ENCOUNTER — Other Ambulatory Visit (HOSPITAL_COMMUNITY): Payer: Self-pay

## 2021-07-13 ENCOUNTER — Other Ambulatory Visit: Payer: Self-pay | Admitting: Internal Medicine

## 2021-07-13 DIAGNOSIS — Z91041 Radiographic dye allergy status: Secondary | ICD-10-CM

## 2021-07-17 DIAGNOSIS — U071 COVID-19: Secondary | ICD-10-CM | POA: Diagnosis not present

## 2021-07-19 DIAGNOSIS — L821 Other seborrheic keratosis: Secondary | ICD-10-CM | POA: Diagnosis not present

## 2021-07-19 DIAGNOSIS — Z86018 Personal history of other benign neoplasm: Secondary | ICD-10-CM | POA: Diagnosis not present

## 2021-07-19 DIAGNOSIS — L814 Other melanin hyperpigmentation: Secondary | ICD-10-CM | POA: Diagnosis not present

## 2021-07-19 DIAGNOSIS — Z23 Encounter for immunization: Secondary | ICD-10-CM | POA: Diagnosis not present

## 2021-07-19 DIAGNOSIS — D225 Melanocytic nevi of trunk: Secondary | ICD-10-CM | POA: Diagnosis not present

## 2021-07-19 DIAGNOSIS — L719 Rosacea, unspecified: Secondary | ICD-10-CM | POA: Diagnosis not present

## 2021-07-25 ENCOUNTER — Encounter: Payer: Self-pay | Admitting: Internal Medicine

## 2021-07-26 DIAGNOSIS — Z20822 Contact with and (suspected) exposure to covid-19: Secondary | ICD-10-CM | POA: Diagnosis not present

## 2021-07-30 ENCOUNTER — Other Ambulatory Visit: Payer: Self-pay | Admitting: Physician Assistant

## 2021-07-30 ENCOUNTER — Telehealth: Payer: Self-pay

## 2021-07-30 ENCOUNTER — Encounter: Payer: Self-pay | Admitting: Internal Medicine

## 2021-07-30 ENCOUNTER — Other Ambulatory Visit (HOSPITAL_COMMUNITY): Payer: Self-pay

## 2021-07-30 DIAGNOSIS — R059 Cough, unspecified: Secondary | ICD-10-CM

## 2021-07-30 DIAGNOSIS — C3491 Malignant neoplasm of unspecified part of right bronchus or lung: Secondary | ICD-10-CM

## 2021-07-30 MED ORDER — BENZONATATE 100 MG PO CAPS: 100.0000 mg | ORAL_CAPSULE | Freq: Three times a day (TID) | ORAL | 0 refills | Status: DC | PRN

## 2021-07-30 MED ORDER — OSIMERTINIB MESYLATE 80 MG PO TABS
ORAL_TABLET | Freq: Every day | ORAL | 2 refills | Status: DC
  Filled 2021-07-30: qty 30, 30d supply, fill #0
  Filled 2021-08-19: qty 30, 30d supply, fill #1
  Filled 2021-09-27: qty 30, 30d supply, fill #2

## 2021-07-30 NOTE — Telephone Encounter (Signed)
Patient called regarding a lingering cough from covid. She finished up her paxlovid, but is still suffering from a bad cough. The pharmacist suggested she call Dr. Earlie Server to see about getting prescribed Tessalon Perles. I relayed this information to Cassie, PA and she sent in a one time prescription for the perles. I let the patient know that she will need to contact her PCP about any further covid related issues. The patient verbalized understanding and thanks.

## 2021-08-02 ENCOUNTER — Ambulatory Visit (HOSPITAL_COMMUNITY): Payer: Medicare Other

## 2021-08-02 ENCOUNTER — Inpatient Hospital Stay: Payer: Medicare Other

## 2021-08-03 ENCOUNTER — Inpatient Hospital Stay: Payer: Medicare Other | Admitting: Internal Medicine

## 2021-08-05 ENCOUNTER — Other Ambulatory Visit (HOSPITAL_COMMUNITY): Payer: Self-pay

## 2021-08-06 DIAGNOSIS — M818 Other osteoporosis without current pathological fracture: Secondary | ICD-10-CM | POA: Diagnosis not present

## 2021-08-09 ENCOUNTER — Other Ambulatory Visit (HOSPITAL_COMMUNITY): Payer: Medicare Other

## 2021-08-12 DIAGNOSIS — Z20822 Contact with and (suspected) exposure to covid-19: Secondary | ICD-10-CM | POA: Diagnosis not present

## 2021-08-16 NOTE — Progress Notes (Deleted)
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  PRIOR THERAPY:  1) Right VATS with right upper lobectomy with bronchoplasty closure and en bloc resection of a wedge from the superior segment of the right lower lobe in addition to mediastinal lymph node dissection on 07/30/2015 under the care of Dr. Roxan Hockey. 2) Adjuvant systemic chemotherapy with cisplatin 75 MG/M2 and Alimta 500 MG/M2 every 3 weeks. First dose was given on 09/10/2015. Status post 4 cycles. 3) adjuvant radiotherapy under the care of Dr. Tammi Klippel.  CURRENT THERAPY: Tagrisso 80 mg p.o. daily started 12/01/2018.  Status post *** months of treatment.  INTERVAL HISTORY: [name] West 71 y.o. female returns to clinic today for follow-up visit.  The patient is feeling fairly well today without any concerning complaints.  She denies any fever, chills, night sweats, or unexplained weight loss.  Denies any chest pain, shortness of breath, cough, or hemoptysis.  Denies any nausea, vomiting, diarrhea, or constipation.  Denies any headache or visual changes.  Denies any rashes or skin changes.  She recently had a restaging CT scan performed.  She is here today for evaluation to review her scan results and for repeat lab work.   MEDICAL HISTORY: Past Medical History:  Diagnosis Date   Anxiety    Arthritis    shoulders   Cancer ([place]) 06/2014   Lung Cancer   Complication of anesthesia    nausea, does well with PROPOFOL    Cough 09/17/2015   Encounter for antineoplastic chemotherapy 10/22/2015   Environmental and seasonal allergies    Family history of adverse  reaction to anesthesia    Patients grandmother died while having hand surgery; pt unsure of cause   Oral thrush 10/22/2015   Ovarian cyst    PONV (postoperative nausea and vomiting)    Raynaud disease     ALLERGIES:  is allergic to avelox [moxifloxacin hcl in nacl], cephalexin, cephalosporins, ciprofloxacin hcl, clindamycin/lincomycin, penicillins, shrimp [shellfish allergy], macrobid [nitrofurantoin monohyd macro], omnipaque [iohexol], azithromycin, fluconazole, adhesive [tape], lidoderm [lidocaine], pseudoephedrine, and sulfa antibiotics.  MEDICATIONS:  Current Outpatient Medications  Medication Sig Dispense Refill   benzonatate (TESSALON) 100 MG capsule Take 1 capsule (100 mg total) by mouth 3 (three) times daily as needed for cough. 30 capsule 0   cholecalciferol (VITAMIN D) 1000 UNITS tablet Take 1,000 Units by mouth daily.     citalopram (CELEXA) 10 MG tablet Take 10 mg by mouth daily.     Clobetasol Prop Oint-Coal Tar (CLOBETAPLUS OINTMENT EX) Apply 1 Dose topically daily.     EPINEPHrine 0.3 mg/0.3 mL IJ SOAJ injection Inject into the muscle as directed.     ibuprofen (ADVIL,MOTRIN) 200 MG tablet Take 200 mg by mouth every 6 (six) hours as needed (pain).     levocetirizine (XYZAL) 5 MG tablet 1 tablet in the evening     LORazepam (ATIVAN) 0.5 MG tablet Take 0.5 mg by mouth at bedtime as needed for anxiety or sleep.      Multiple Vitamin (MULTIVITAMIN WITH MINERALS) TABS Take 1 tablet by mouth daily.     NONFORMULARY OR COMPOUNDED ITEM Place 1 application vaginally 2 (two) times a week. Estradiol 0.02% Cream  osimertinib mesylate (TAGRISSO) 80 MG tablet TAKE 1 TABLET (80 MG TOTAL) BY MOUTH DAILY. 30 tablet 2   predniSONE (DELTASONE) 50 MG tablet TAKE 1 TABLET BY MOUTH 13 HOURS PRIOR TO CT, 1 TABLET 7 HOURS PRIOR & 1 TABLET 1 HOUR PRIOR TO CT 3 tablet 3   Probiotic Product (ALIGN) 4 MG CAPS Take 1 capsule by mouth daily.     Wheat Dextrin (BENEFIBER) POWD See admin instructions.      No current facility-administered medications for this visit.    SURGICAL HISTORY:  Past Surgical History:  Procedure Laterality Date   BREAST EXCISIONAL BIOPSY Left    [place]   COLONOSCOPY WITH PROPOFOL N/A 09/27/2012   Procedure: COLONOSCOPY WITH PROPOFOL;  Surgeon: Garlan Fair, MD;  Location: [facility];  Service: Endoscopy;  Laterality: N/A;   Fibroid Removed     LAPAROTOMY Left 07/20/2016   Procedure: MINI LAPAROTOMY, REMOVAL OF LEFT OVARIAN CYST, LEFT SALPINGO-OOPHORECTOMY;  Surgeon: Dian Queen, MD;  Location: [facility];  Service: Gynecology;  Laterality: Left;  please moved to 7:30am if spot opens   Right Rotator Cuff Right    TOTAL SHOULDER ARTHROPLASTY Left 06/14/2018   Procedure: TOTAL SHOULDER ARTHROPLASTY;  Surgeon: Justice Britain, MD;  Location: [facility];  Service: Orthopedics;  Laterality: Left;  181mn   VIDEO ASSISTED THORACOSCOPY (VATS)/ LOBECTOMY Right 07/30/2015   Procedure: RIGHT VIDEO ASSISTED THORACOSCOPY (VATS)/RIGHT UPPER LOBECTOMY;  Surgeon: SMelrose Nakayama MD;  [facility]  Service: Thoracic;  Laterality: Right;   VIDEO BRONCHOSCOPY WITH ENDOBRONCHIAL NAVIGATION N/A 07/15/2015   Procedure: VIDEO BRONCHOSCOPY WITH ENDOBRONCHIAL NAVIGATION with Biopsy;  Surgeon: RCollene Gobble MD;  [facility]  Service: Thoracic;  Laterality: N/A;    REVIEW OF SYSTEMS:   Review of Systems  Constitutional: Negative for appetite change, chills, fatigue, fever and unexpected weight change.  HENT:   Negative for mouth sores, nosebleeds, sore throat and trouble swallowing.   Eyes: Negative for eye problems and icterus.  Respiratory: Negative for cough, hemoptysis, shortness of breath and wheezing.   Cardiovascular: Negative for chest pain and leg swelling.  Gastrointestinal: Negative for abdominal pain, constipation, diarrhea, nausea and vomiting.  Genitourinary: Negative for bladder incontinence, difficulty urinating, dysuria, frequency and  hematuria.   Musculoskeletal: Negative for back pain, gait problem, neck pain and neck stiffness.  Skin: Negative for itching and rash.  Neurological: Negative for dizziness, extremity weakness, gait problem, headaches, light-headedness and seizures.  Hematological: Negative for adenopathy. Does not bruise/bleed easily.  Psychiatric/Behavioral: Negative for confusion, depression and sleep disturbance. The patient is not nervous/anxious.     PHYSICAL EXAMINATION:  There were no vitals taken for this visit.  ECOG PERFORMANCE STATUS: {CHL ONC ECOG [id number] Physical Exam  Constitutional: Oriented to person, place, and time and well-developed, well-nourished, and in no distress. No distress.  HENT:  Head: Normocephalic and atraumatic.  Mouth/Throat: Oropharynx is clear and moist. No oropharyngeal exudate.  Eyes: Conjunctivae are normal. Right eye exhibits no discharge. Left eye exhibits no discharge. No scleral icterus.  Neck: Normal range of motion. Neck supple.  Cardiovascular: Normal rate, regular rhythm, normal heart sounds and intact distal pulses.   Pulmonary/Chest: Effort normal and breath sounds normal. No respiratory distress. No wheezes. No rales.  Abdominal: Soft. Bowel sounds are normal. Exhibits no distension and no mass. There is no tenderness.  Musculoskeletal: Normal range of motion. Exhibits no edema.  Lymphadenopathy:    No cervical adenopathy.  Neurological: Alert  and oriented to person, place, and time. Exhibits normal muscle tone. Gait normal. Coordination normal.  Skin: Skin is warm and dry. No rash noted. Not diaphoretic. No erythema. No pallor.  Psychiatric: Mood, memory and judgment normal.  Vitals reviewed.  LABORATORY DATA: Lab Results  Component Value Date   WBC 5.5 05/31/2021   HGB 14.0 05/31/2021   HCT 41.9 05/31/2021   MCV 91.7 05/31/2021   PLT 180 05/31/2021      Chemistry      Component Value Date/Time   NA 141 05/31/2021 0821   NA 139  06/16/2017 1132   K 4.8 05/31/2021 0821   K 4.7 06/16/2017 1132   CL 103 05/31/2021 0821   CO2 29 05/31/2021 0821   CO2 28 06/16/2017 1132   BUN 19 05/31/2021 0821   BUN 19.5 06/16/2017 1132   CREATININE 1.05 (H) 05/31/2021 0821   CREATININE 0.8 06/16/2017 1132      Component Value Date/Time   CALCIUM 9.3 05/31/2021 0821   CALCIUM 10.0 06/16/2017 1132   ALKPHOS 53 05/31/2021 0821   ALKPHOS 68 06/16/2017 1132   AST 23 05/31/2021 0821   AST 21 06/16/2017 1132   ALT 16 05/31/2021 0821   ALT 18 06/16/2017 1132   BILITOT 0.4 05/31/2021 0821   BILITOT 0.50 06/16/2017 1132       RADIOGRAPHIC STUDIES:  No results found.   ASSESSMENT/PLAN:  This is a very pleasant 71 year old Caucasian female initially diagnosed with stage IIIa non-small cell lung cancer, adenocarcinoma.  She is status post a right upper lobectomy with lymph node dissection followed by adjuvant systemic chemotherapy and adjuvant radiation.  She was initially diagnosed in February 2017.  She had been on observation for 3 years but unfortunately in June 2020 she showed multiple tiny pulmonary nodules scattered throughout both lungs many of which had increased in size and some new nodules were noted.  She then started treatment with Tagrisso 80 mg p.o. daily.  She is status post ***months of treatment.  She is tolerating treatment well without any concerning adverse side effects.  The patient recently had a restaging CT scan performed.  Dr. Julien Nordmann personally and independently reviewed the scan discussed results with the patient today.  The scan showed ***.  Dr. Julien Nordmann recommends ***with treatment at the same dose.  She will ***Tagrisso as prescribed.  We will see her back for follow-up visit in 2 months for evaluation and repeat blood work.  The patient was advised to call immediately if she has any concerning symptoms in the interval. The patient voices understanding of current disease status and treatment options  and is in agreement with the current care plan. All questions were answered. The patient knows to call the clinic with any problems, questions or concerns. We can certainly see the patient much sooner if necessary      No orders of the defined types were placed in this encounter.    I spent {CHL ONC TIME VISIT - MHWKG:[id number]} counseling the patient face to face. The total time spent in the appointment was {CHL ONC TIME VISIT - VOPFY:[id number]}.  Nallely Yost L Azarion Hove, PA-C 08/16/21]

## 2021-08-17 ENCOUNTER — Encounter: Payer: Self-pay | Admitting: Internal Medicine

## 2021-08-17 ENCOUNTER — Ambulatory Visit (HOSPITAL_COMMUNITY)
Admission: RE | Admit: 2021-08-17 | Discharge: 2021-08-17 | Disposition: A | Discharge status: Home / Self Care | Payer: Medicare Other | Source: Ambulatory Visit | Attending: Internal Medicine | Admitting: Internal Medicine

## 2021-08-17 ENCOUNTER — Other Ambulatory Visit: Payer: Self-pay

## 2021-08-17 ENCOUNTER — Encounter (HOSPITAL_COMMUNITY): Payer: Self-pay

## 2021-08-17 ENCOUNTER — Inpatient Hospital Stay: Payer: Medicare Other | Attending: Internal Medicine

## 2021-08-17 DIAGNOSIS — C3490 Malignant neoplasm of unspecified part of unspecified bronchus or lung: Secondary | ICD-10-CM

## 2021-08-17 DIAGNOSIS — C3411 Malignant neoplasm of upper lobe, right bronchus or lung: Secondary | ICD-10-CM | POA: Insufficient documentation

## 2021-08-17 DIAGNOSIS — K5900 Constipation, unspecified: Secondary | ICD-10-CM | POA: Diagnosis not present

## 2021-08-17 DIAGNOSIS — R918 Other nonspecific abnormal finding of lung field: Secondary | ICD-10-CM | POA: Diagnosis not present

## 2021-08-17 DIAGNOSIS — E876 Hypokalemia: Secondary | ICD-10-CM | POA: Insufficient documentation

## 2021-08-17 DIAGNOSIS — U071 COVID-19: Secondary | ICD-10-CM | POA: Diagnosis not present

## 2021-08-17 LAB — CBC WITH DIFFERENTIAL (CANCER CENTER ONLY)
Abs Immature Granulocytes: 0.02 10*3/uL (ref 0.00–0.07)
Basophils Absolute: 0 10*3/uL (ref 0.0–0.1)
Basophils Relative: 0 %
Eosinophils Absolute: 0 10*3/uL (ref 0.0–0.5)
Eosinophils Relative: 0 %
HCT: 42.2 % (ref 36.0–46.0)
Hemoglobin: 14 g/dL (ref 12.0–15.0)
Immature Granulocytes: 0 %
Lymphocytes Relative: 8 %
Lymphs Abs: 0.5 10*3/uL — ABNORMAL LOW (ref 0.7–4.0)
MCH: 30.1 pg (ref 26.0–34.0)
MCHC: 33.2 g/dL (ref 30.0–36.0)
MCV: 90.8 fL (ref 80.0–100.0)
Monocytes Absolute: 0.2 10*3/uL (ref 0.1–1.0)
Monocytes Relative: 3 %
Neutro Abs: 5.8 10*3/uL (ref 1.7–7.7)
Neutrophils Relative %: 89 %
Platelet Count: 209 10*3/uL (ref 150–400)
RBC: 4.65 MIL/uL (ref 3.87–5.11)
RDW: 13.4 % (ref 11.5–15.5)
WBC Count: 6.5 10*3/uL (ref 4.0–10.5)
nRBC: 0 % (ref 0.0–0.2)

## 2021-08-17 LAB — CMP (CANCER CENTER ONLY)
ALT: 18 U/L (ref 0–44)
AST: 23 U/L (ref 15–41)
Albumin: 4.7 g/dL (ref 3.5–5.0)
Alkaline Phosphatase: 57 U/L (ref 38–126)
Anion gap: 6 (ref 5–15)
BUN: 28 mg/dL — ABNORMAL HIGH (ref 8–23)
CO2: 30 mmol/L (ref 22–32)
Calcium: 10.3 mg/dL (ref 8.9–10.3)
Chloride: 98 mmol/L (ref 98–111)
Creatinine: 1 mg/dL (ref 0.44–1.00)
GFR, Estimated: >60 mL/min (ref >60)
Glucose, Bld: 245 mg/dL — ABNORMAL HIGH (ref 70–99)
Potassium: 5.2 mmol/L — ABNORMAL HIGH (ref 3.5–5.1)
Sodium: 134 mmol/L — ABNORMAL LOW (ref 135–145)
Total Bilirubin: 0.5 mg/dL (ref 0.3–1.2)
Total Protein: 7.4 g/dL (ref 6.5–8.1)

## 2021-08-17 MED ORDER — SODIUM CHLORIDE (PF) 0.9 % IJ SOLN
INTRAMUSCULAR | Status: AC
  Filled 2021-08-17: qty 50

## 2021-08-17 MED ORDER — IOHEXOL 300 MG/ML  SOLN
80.0000 mL | Freq: Once | INTRAMUSCULAR | Status: AC | PRN
  Administered 2021-08-17: 80 mL via INTRAVENOUS

## 2021-08-19 ENCOUNTER — Other Ambulatory Visit: Payer: Self-pay

## 2021-08-19 ENCOUNTER — Other Ambulatory Visit (HOSPITAL_COMMUNITY): Payer: Self-pay

## 2021-08-19 ENCOUNTER — Encounter: Payer: Self-pay | Admitting: Internal Medicine

## 2021-08-19 ENCOUNTER — Inpatient Hospital Stay (HOSPITAL_BASED_OUTPATIENT_CLINIC_OR_DEPARTMENT_OTHER): Payer: Medicare Other | Admitting: Internal Medicine

## 2021-08-19 VITALS — BP 136/74 | HR 100 | Temp 97.7°F | Resp 16 | Ht 59.5 in | Wt 91.5 lb

## 2021-08-19 DIAGNOSIS — C3411 Malignant neoplasm of upper lobe, right bronchus or lung: Secondary | ICD-10-CM | POA: Diagnosis not present

## 2021-08-19 DIAGNOSIS — Z5111 Encounter for antineoplastic chemotherapy: Secondary | ICD-10-CM

## 2021-08-19 DIAGNOSIS — C3491 Malignant neoplasm of unspecified part of right bronchus or lung: Secondary | ICD-10-CM

## 2021-08-19 DIAGNOSIS — E876 Hypokalemia: Secondary | ICD-10-CM | POA: Diagnosis not present

## 2021-08-19 NOTE — Progress Notes (Signed)
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  PRIOR THERAPY: 1) Right VATS with right upper lobectomy with bronchoplasty closure and en bloc resection of a wedge from the superior segment of the right lower lobe in addition to mediastinal lymph node dissection on 07/30/2015 under the care of Dr. Roxan Hockey. 2) Adjuvant systemic chemotherapy with cisplatin 75 MG/M2 and Alimta 500 MG/M2 every 3 weeks. First dose was given on 09/10/2015. Status post 4 cycles. 3) adjuvant radiotherapy under the care of Dr. Tammi Klippel.  CURRENT THERAPY: Tagrisso 80 mg p.o. daily started 12/01/2018.  Status post 32 months of treatment.  INTERVAL HISTORY: [name] West 71 y.o. female returns to the clinic today for follow-up visit.  The patient is feeling fine today with no concerning complaints.  She had COVID-19 infection a month ago and she was treated with Paxlovid.  She denied having any current chest pain, shortness of breath, cough or hemoptysis.  She has no nausea, vomiting, diarrhea or constipation.  She has no headache or visual changes.  She continues to tolerate her treatment with Tagrisso fairly well.  She had repeat CT scan of the chest, abdomen pelvis performed recently and she is here for evaluation and discussion of her scan results.  She is traveling to [place] for vacation next week.  MEDICAL HISTORY: Past Medical History:  Diagnosis Date   Anxiety    Arthritis    shoulders   Cancer ([facility]) 06/2014   Lung Cancer   Complication of anesthesia    nausea, does well with  PROPOFOL    Cough 09/17/2015   Encounter for antineoplastic chemotherapy 10/22/2015   Environmental and seasonal allergies    Family history of adverse reaction to anesthesia    Patients grandmother died while having hand surgery; pt unsure of cause   Oral thrush 10/22/2015   Ovarian cyst    PONV (postoperative nausea and vomiting)    Raynaud disease     ALLERGIES:  is allergic to avelox [moxifloxacin hcl in nacl], cephalexin, cephalosporins, ciprofloxacin hcl, clindamycin/lincomycin, penicillins, shrimp [shellfish allergy], macrobid [nitrofurantoin monohyd macro], omnipaque [iohexol], azithromycin, fluconazole, adhesive [tape], lidoderm [lidocaine], pseudoephedrine, and sulfa antibiotics.  MEDICATIONS:  Current Outpatient Medications  Medication Sig Dispense Refill   benzonatate (TESSALON) 100 MG capsule Take 1 capsule (100 mg total) by mouth 3 (three) times daily as needed for cough. 30 capsule 0   cholecalciferol (VITAMIN D) 1000 UNITS tablet Take 1,000 Units by mouth daily.     citalopram (CELEXA) 10 MG tablet Take 10 mg by mouth daily.     Clobetasol Prop Oint-Coal Tar (CLOBETAPLUS OINTMENT EX) Apply 1 Dose topically daily.     EPINEPHrine 0.3 mg/0.3 mL IJ SOAJ injection Inject into the muscle as directed.     ibuprofen (ADVIL,MOTRIN) 200 MG tablet Take 200 mg by mouth every 6 (six) hours as needed (pain).     levocetirizine (XYZAL) 5 MG tablet 1 tablet in the evening     LORazepam (ATIVAN) 0.5 MG tablet Take 0.5 mg by mouth at bedtime as needed for anxiety or sleep.      Multiple Vitamin (MULTIVITAMIN WITH  MINERALS) TABS Take 1 tablet by mouth daily.     NONFORMULARY OR COMPOUNDED ITEM Place 1 application vaginally 2 (two) times a week. Estradiol 0.02% Cream     osimertinib mesylate (TAGRISSO) 80 MG tablet TAKE 1 TABLET (80 MG TOTAL) BY MOUTH DAILY. 30 tablet 2   predniSONE (DELTASONE) 50 MG tablet TAKE 1 TABLET BY MOUTH 13 HOURS PRIOR TO CT, 1 TABLET 7 HOURS PRIOR & 1 TABLET 1 HOUR  PRIOR TO CT 3 tablet 3   Probiotic Product (ALIGN) 4 MG CAPS Take 1 capsule by mouth daily.     Wheat Dextrin (BENEFIBER) POWD See admin instructions.     No current facility-administered medications for this visit.    SURGICAL HISTORY:  Past Surgical History:  Procedure Laterality Date   BREAST EXCISIONAL BIOPSY Left    [facility]   COLONOSCOPY WITH PROPOFOL N/A 09/27/2012   Procedure: COLONOSCOPY WITH PROPOFOL;  Surgeon: Garlan Fair, MD;  Location: [facility];  Service: Endoscopy;  Laterality: N/A;   Fibroid Removed     LAPAROTOMY Left 07/20/2016   Procedure: MINI LAPAROTOMY, REMOVAL OF LEFT OVARIAN CYST, LEFT SALPINGO-OOPHORECTOMY;  Surgeon: Dian Queen, MD;  Location: [facility];  Service: Gynecology;  Laterality: Left;  please moved to 7:30am if spot opens   Right Rotator Cuff Right    TOTAL SHOULDER ARTHROPLASTY Left 06/14/2018   Procedure: TOTAL SHOULDER ARTHROPLASTY;  Surgeon: Justice Britain, MD;  Location: [facility];  Service: Orthopedics;  Laterality: Left;  130mn   VIDEO ASSISTED THORACOSCOPY (VATS)/ LOBECTOMY Right 07/30/2015   Procedure: RIGHT VIDEO ASSISTED THORACOSCOPY (VATS)/RIGHT UPPER LOBECTOMY;  Surgeon: SMelrose Nakayama MD;  [facility]  Service: Thoracic;  Laterality: Right;   VIDEO BRONCHOSCOPY WITH ENDOBRONCHIAL NAVIGATION N/A 07/15/2015   Procedure: VIDEO BRONCHOSCOPY WITH ENDOBRONCHIAL NAVIGATION with Biopsy;  Surgeon: RCollene Gobble MD;  Location: [facility]  Service: Thoracic;  Laterality: N/A;    REVIEW OF SYSTEMS:  Constitutional: negative Eyes: negative Ears, nose, mouth, throat, and face: negative Respiratory: negative Cardiovascular: negative Gastrointestinal: negative Genitourinary:negative Integument/breast: negative Hematologic/lymphatic: negative Musculoskeletal:negative Neurological: negative Behavioral/Psych: negative Endocrine: negative Allergic/Immunologic: negative   PHYSICAL EXAMINATION: General appearance:  alert, cooperative, and no distress Head: Normocephalic, without obvious abnormality, atraumatic Neck: no adenopathy, no JVD, supple, symmetrical, trachea midline, and thyroid not enlarged, symmetric, no tenderness/mass/nodules Lymph nodes: Cervical, supraclavicular, and axillary nodes normal. Resp: clear to auscultation bilaterally Back: symmetric, no curvature. ROM normal. No CVA tenderness. Cardio: regular rate and rhythm, S1, S2 normal, no murmur, click, rub or gallop GI: soft, non-tender; bowel sounds normal; no masses,  no organomegaly Extremities: extremities normal, atraumatic, no cyanosis or edema Neurologic: Alert and oriented X 3, normal strength and tone. Normal symmetric reflexes. Normal coordination and gait  ECOG PERFORMANCE STATUS: 0 - Asymptomatic  Blood pressure 136/74, pulse 100, temperature 97.7 F (36.5 C), temperature source Tympanic, resp. rate 16, height 4' 11.5" (1.511 m), weight 91 lb 8 oz (41.5 kg), SpO2 98 %.   LABORATORY DATA: Lab Results  Component Value Date   WBC 6.5 08/17/2021   HGB 14.0 08/17/2021   HCT 42.2 08/17/2021   MCV 90.8 08/17/2021   PLT 209 08/17/2021      Chemistry      Component Value Date/Time   NA 134 (L) 08/17/2021 0933   NA 139 06/16/2017 1132   K 5.2 (H) 08/17/2021 0933   K 4.7 06/16/2017 1132   CL 98 08/17/2021 0933   CO2 30 08/17/2021  0933   CO2 28 06/16/2017 1132   BUN 28 (H) 08/17/2021 0933   BUN 19.5 06/16/2017 1132   CREATININE 1.00 08/17/2021 0933   CREATININE 0.8 06/16/2017 1132      Component Value Date/Time   CALCIUM 10.3 08/17/2021 0933   CALCIUM 10.0 06/16/2017 1132   ALKPHOS 57 08/17/2021 0933   ALKPHOS 68 06/16/2017 1132   AST 23 08/17/2021 0933   AST 21 06/16/2017 1132   ALT 18 08/17/2021 0933   ALT 18 06/16/2017 1132   BILITOT 0.5 08/17/2021 0933   BILITOT 0.50 06/16/2017 1132       RADIOGRAPHIC STUDIES: CT Chest W Contrast  Result Date: 08/17/2021 CLINICAL DATA:  A 71 year old female presents  for evaluation of non-small cell lung cancer which was diagnosed initially in 2017 post chemo and radiotherapy with ongoing systemic therapy. Also the patient has reportedly recently tested positive for COVID-19. EXAM: CT CHEST, ABDOMEN, AND PELVIS WITH CONTRAST TECHNIQUE: Multidetector CT imaging of the chest, abdomen and pelvis was performed following the standard protocol during bolus administration of intravenous contrast. RADIATION DOSE REDUCTION: This exam was performed according to the departmental dose-optimization program which includes automated exposure control, adjustment of the mA and/or kV according to patient size and/or use of iterative reconstruction technique. CONTRAST:  65m OMNIPAQUE IOHEXOL 300 MG/ML  SOLN COMPARISON:  March 30, 2021. FINDINGS: CT CHEST FINDINGS Cardiovascular: Heart size is normal. No pericardial effusion. Normal caliber of the thoracic aorta. Noncalcified plaque of the thoracic aorta. Normal caliber of central pulmonary vessels. Distortion of RIGHT hilum secondary to partial lung resection as before. Mediastinum/Nodes: Streak artifact from LEFT shoulder arthroplasty limits assessment of the axilla particularly on the LEFT and the thoracic inlet also more so on the LEFT than the RIGHT. No adenopathy in the chest. Esophagus grossly normal. Lungs/Pleura: Post partial lung resection in the RIGHT chest with changes of upper lobectomy and prior wedge resection. Bandlike areas of scarring track along the posterior RIGHT chest and RIGHT lung apex without change from prior imaging. Juxtapleural nodule (image 77/4) this is in the RIGHT lower lobe measuring 5 mm and is unchanged. Juxtapleural nodule (image 62/4) also in the RIGHT lower lobe and also unchanged. No consolidation, effusion or signs of ground-glass opacities. Musculoskeletal: No chest wall mass. CT ABDOMEN PELVIS FINDINGS Hepatobiliary: No focal, suspicious hepatic lesion. Post cholecystectomy. No biliary duct dilation.  Portal vein is patent. Pancreas: Normal, without mass, inflammation or ductal dilatation. Spleen: Normal. Adrenals/Urinary Tract: Adrenal glands are unremarkable. Symmetric renal enhancement. No sign of hydronephrosis. No suspicious renal lesion or perinephric stranding. Urinary bladder is grossly unremarkable. Stomach/Bowel: Abundant stool in the colon with mild distension of the cecum without signs of inflammation and with moderate stool elsewhere in the colon. Vascular/Lymphatic: Aortic atherosclerosis. No sign of aneurysm. Smooth contour of the IVC. There is no gastrohepatic or hepatoduodenal ligament lymphadenopathy. No retroperitoneal or mesenteric lymphadenopathy. No pelvic sidewall lymphadenopathy. Reproductive: Unremarkable by CT. Other: No ascites Musculoskeletal: Streak artifact from LEFT shoulder arthroplasty as described above. No acute bone finding. No destructive bone process. Spinal degenerative changes. IMPRESSION: No signs of disease in the chest, abdomen or pelvis following RIGHT upper lobectomy and prior wedge resection in the superior segment of the RIGHT lower lobe. Scattered small pulmonary nodules without change. Abundant stool in the colon, correlate with any signs of constipation. Electronically Signed   By: GZetta BillsM.D.   On: 08/17/2021 16:30   CT Abdomen Pelvis W Contrast  Result Date: 08/17/2021 CLINICAL DATA:  A 71 year old female presents for evaluation of non-small cell lung cancer which was diagnosed initially in 2017 post chemo and radiotherapy with ongoing systemic therapy. Also the patient has reportedly recently tested positive for COVID-19. EXAM: CT CHEST, ABDOMEN, AND PELVIS WITH CONTRAST TECHNIQUE: Multidetector CT imaging of the chest, abdomen and pelvis was performed following the standard protocol during bolus administration of intravenous contrast. RADIATION DOSE REDUCTION: This exam was performed according to the departmental dose-optimization program which  includes automated exposure control, adjustment of the mA and/or kV according to patient size and/or use of iterative reconstruction technique. CONTRAST:  32m OMNIPAQUE IOHEXOL 300 MG/ML  SOLN COMPARISON:  March 30, 2021. FINDINGS: CT CHEST FINDINGS Cardiovascular: Heart size is normal. No pericardial effusion. Normal caliber of the thoracic aorta. Noncalcified plaque of the thoracic aorta. Normal caliber of central pulmonary vessels. Distortion of RIGHT hilum secondary to partial lung resection as before. Mediastinum/Nodes: Streak artifact from LEFT shoulder arthroplasty limits assessment of the axilla particularly on the LEFT and the thoracic inlet also more so on the LEFT than the RIGHT. No adenopathy in the chest. Esophagus grossly normal. Lungs/Pleura: Post partial lung resection in the RIGHT chest with changes of upper lobectomy and prior wedge resection. Bandlike areas of scarring track along the posterior RIGHT chest and RIGHT lung apex without change from prior imaging. Juxtapleural nodule (image 77/4) this is in the RIGHT lower lobe measuring 5 mm and is unchanged. Juxtapleural nodule (image 62/4) also in the RIGHT lower lobe and also unchanged. No consolidation, effusion or signs of ground-glass opacities. Musculoskeletal: No chest wall mass. CT ABDOMEN PELVIS FINDINGS Hepatobiliary: No focal, suspicious hepatic lesion. Post cholecystectomy. No biliary duct dilation. Portal vein is patent. Pancreas: Normal, without mass, inflammation or ductal dilatation. Spleen: Normal. Adrenals/Urinary Tract: Adrenal glands are unremarkable. Symmetric renal enhancement. No sign of hydronephrosis. No suspicious renal lesion or perinephric stranding. Urinary bladder is grossly unremarkable. Stomach/Bowel: Abundant stool in the colon with mild distension of the cecum without signs of inflammation and with moderate stool elsewhere in the colon. Vascular/Lymphatic: Aortic atherosclerosis. No sign of aneurysm. Smooth  contour of the IVC. There is no gastrohepatic or hepatoduodenal ligament lymphadenopathy. No retroperitoneal or mesenteric lymphadenopathy. No pelvic sidewall lymphadenopathy. Reproductive: Unremarkable by CT. Other: No ascites Musculoskeletal: Streak artifact from LEFT shoulder arthroplasty as described above. No acute bone finding. No destructive bone process. Spinal degenerative changes. IMPRESSION: No signs of disease in the chest, abdomen or pelvis following RIGHT upper lobectomy and prior wedge resection in the superior segment of the RIGHT lower lobe. Scattered small pulmonary nodules without change. Abundant stool in the colon, correlate with any signs of constipation. Electronically Signed   By: GZetta BillsM.D.   On: 08/17/2021 16:30    ASSESSMENT AND PLAN:  This is a very pleasant 70years old white female with recurrent non-small cell lung cancer initially diagnosed as stage IIIA non-small cell lung cancer, adenocarcinoma with positive EGFR mutation in exon 19 status post right upper lobectomy with lymph node dissection followed by adjuvant systemic chemotherapy as well as adjuvant radiation. The patient has been on observation for almost 3 years now.  She is feeling fine with no concerning complaints.   Unfortunately her scan in June 2020 showed multiple tiny pulmonary nodules scattered throughout both lungs which many of them have increased in size and some new nodule suspected the previous scan.  The patient started treatment with Tagrisso 80 mg p.o. daily status post 32 months of treatment.   The  patient has been tolerating her treatment with Tagrisso fairly well with no concerning adverse effects. She had repeat CT scan of the chest, abdomen pelvis performed recently.  I personally and independently reviewed the scans and discussed the results with the patient today. Her scan showed no concerning findings for disease progression in the chest, abdomen or pelvis. I recommended for her to  continue her current treatment with Tagrisso with the same dose. For the mild hyperkalemia, I recommended for the patient to decrease her potassium rich diet. I will see her back for follow-up visit in 2 months for evaluation and repeat blood work. The patient voices understanding of current disease status and treatment options and is in agreement with the current care plan. All questions were answered. The patient knows to call the clinic with any problems, questions or concerns. We can certainly see the patient much sooner if necessary.  Disclaimer: This note was dictated with voice recognition software. Similar sounding words can inadvertently be transcribed and may not be corrected upon review.

## 2021-08-28 DIAGNOSIS — Z20828 Contact with and (suspected) exposure to other viral communicable diseases: Secondary | ICD-10-CM | POA: Diagnosis not present

## 2021-09-03 DIAGNOSIS — Z20822 Contact with and (suspected) exposure to covid-19: Secondary | ICD-10-CM | POA: Diagnosis not present

## 2021-09-06 ENCOUNTER — Other Ambulatory Visit (HOSPITAL_COMMUNITY): Payer: Self-pay

## 2021-09-06 DIAGNOSIS — Z20822 Contact with and (suspected) exposure to covid-19: Secondary | ICD-10-CM | POA: Diagnosis not present

## 2021-09-10 DIAGNOSIS — Z20822 Contact with and (suspected) exposure to covid-19: Secondary | ICD-10-CM | POA: Diagnosis not present

## 2021-09-22 DIAGNOSIS — E782 Mixed hyperlipidemia: Secondary | ICD-10-CM | POA: Diagnosis not present

## 2021-09-23 DIAGNOSIS — Z20822 Contact with and (suspected) exposure to covid-19: Secondary | ICD-10-CM | POA: Diagnosis not present

## 2021-09-24 LAB — NMR, LIPOPROFILE
Cholesterol, Total: 228 mg/dL — ABNORMAL HIGH (ref 100–199)
HDL Particle Number: 36.8 umol/L (ref >=30.5)
HDL-C: 104 mg/dL (ref >39)
LDL Particle Number: 880 nmol/L (ref <1000)
LDL Size: 21.1 nm (ref >20.5)
LDL-C (NIH Calc): 113 mg/dL — ABNORMAL HIGH (ref 0–99)
LP-IR Score: <25 (ref <=45)
Small LDL Particle Number: <90 nmol/L (ref <=527)
Triglycerides: 67 mg/dL (ref 0–149)

## 2021-09-25 ENCOUNTER — Encounter: Payer: Self-pay | Admitting: Internal Medicine

## 2021-09-25 DIAGNOSIS — E785 Hyperlipidemia, unspecified: Secondary | ICD-10-CM

## 2021-09-27 ENCOUNTER — Encounter: Payer: Self-pay | Admitting: Internal Medicine

## 2021-09-27 ENCOUNTER — Other Ambulatory Visit (HOSPITAL_COMMUNITY): Payer: Self-pay

## 2021-09-27 DIAGNOSIS — Z20822 Contact with and (suspected) exposure to covid-19: Secondary | ICD-10-CM | POA: Diagnosis not present

## 2021-09-28 ENCOUNTER — Telehealth: Payer: Self-pay | Admitting: Internal Medicine

## 2021-09-28 MED ORDER — ATORVASTATIN CALCIUM 20 MG PO TABS: 20.0000 mg | ORAL_TABLET | Freq: Every day | ORAL | 3 refills | Status: DC

## 2021-09-28 NOTE — Addendum Note (Signed)
Addended by: Fidel Levy on: 09/28/2021 08:48 AM ? ? Modules accepted: Orders ? ?

## 2021-09-28 NOTE — Telephone Encounter (Signed)
Spoke with the patient and gave her recommendations from PharmD. Patient states that she tends to react to a lot of drugs and does not think it is a good idea to start on something that she is going to be at a higher risk for an adverse effect. I advised that this does not necessarily mean that she will have side effects and that is typically just a trail and error. Advised that if she did start to have myalgias then we could definitely switch her to something else. Patient would like to know if there is something else that she can try instead that does not have any interaction with Tagrisso and would not increase risk of side effects. She would like to know what Dr. Debara Pickett thinks.  ?

## 2021-09-28 NOTE — Telephone Encounter (Signed)
Patient called stating that [facility] told her that [facility] will increase the strength of 20mg  Atorvastatin and they recommend that she start with 10mg .  ?She sent a MyChart message about this as well.   ?

## 2021-09-28 NOTE — Telephone Encounter (Signed)
Due to patient's ASCVD and current LDL, recommend she stay on atorvastatin 20mg  despite the interaction.  Increasing the concentration on the atorvastatin 20mg  may be beneficial to her.  However it can also increase risk of myalgias so patient should be aware and call if she has adverse effects. ?

## 2021-09-29 ENCOUNTER — Encounter: Payer: Self-pay | Admitting: Internal Medicine

## 2021-09-29 NOTE — Telephone Encounter (Signed)
Called patient today to clarify concerns about the Tagrisso - she will take the lipitor and let us know if there are any issues, but I do not expect them. Will need repeat lipid in 3-4 months and follow-up after. ? ?Dr. Lemmie Evens ?

## 2021-09-30 ENCOUNTER — Encounter: Payer: Self-pay | Admitting: Internal Medicine

## 2021-09-30 NOTE — Telephone Encounter (Signed)
MD communicated with patient.  ?

## 2021-10-01 ENCOUNTER — Other Ambulatory Visit (HOSPITAL_COMMUNITY): Payer: Self-pay

## 2021-10-07 DIAGNOSIS — Z20822 Contact with and (suspected) exposure to covid-19: Secondary | ICD-10-CM | POA: Diagnosis not present

## 2021-10-11 ENCOUNTER — Other Ambulatory Visit (HOSPITAL_COMMUNITY): Payer: Self-pay

## 2021-10-12 DIAGNOSIS — Z20822 Contact with and (suspected) exposure to covid-19: Secondary | ICD-10-CM | POA: Diagnosis not present

## 2021-10-18 DIAGNOSIS — Z20822 Contact with and (suspected) exposure to covid-19: Secondary | ICD-10-CM | POA: Diagnosis not present

## 2021-10-19 ENCOUNTER — Telehealth: Payer: Self-pay | Admitting: Internal Medicine

## 2021-10-19 NOTE — Telephone Encounter (Signed)
Called patient regarding upcoming appointment, patient is notified. °

## 2021-10-20 ENCOUNTER — Inpatient Hospital Stay: Payer: Medicare Other | Attending: Internal Medicine

## 2021-10-20 ENCOUNTER — Other Ambulatory Visit: Payer: Self-pay

## 2021-10-20 ENCOUNTER — Inpatient Hospital Stay (HOSPITAL_BASED_OUTPATIENT_CLINIC_OR_DEPARTMENT_OTHER): Payer: Medicare Other | Admitting: Internal Medicine

## 2021-10-20 VITALS — BP 121/68 | HR 72 | Resp 16 | Wt 90.4 lb

## 2021-10-20 DIAGNOSIS — R918 Other nonspecific abnormal finding of lung field: Secondary | ICD-10-CM | POA: Diagnosis not present

## 2021-10-20 DIAGNOSIS — C3490 Malignant neoplasm of unspecified part of unspecified bronchus or lung: Secondary | ICD-10-CM | POA: Diagnosis not present

## 2021-10-20 DIAGNOSIS — Z79899 Other long term (current) drug therapy: Secondary | ICD-10-CM | POA: Insufficient documentation

## 2021-10-20 DIAGNOSIS — Z5111 Encounter for antineoplastic chemotherapy: Secondary | ICD-10-CM

## 2021-10-20 DIAGNOSIS — C3411 Malignant neoplasm of upper lobe, right bronchus or lung: Secondary | ICD-10-CM | POA: Diagnosis not present

## 2021-10-20 DIAGNOSIS — C3491 Malignant neoplasm of unspecified part of right bronchus or lung: Secondary | ICD-10-CM

## 2021-10-20 DIAGNOSIS — M25552 Pain in left hip: Secondary | ICD-10-CM | POA: Diagnosis not present

## 2021-10-20 DIAGNOSIS — Z23 Encounter for immunization: Secondary | ICD-10-CM | POA: Diagnosis not present

## 2021-10-20 LAB — CBC WITH DIFFERENTIAL (CANCER CENTER ONLY)
Abs Immature Granulocytes: 0.01 10*3/uL (ref 0.00–0.07)
Basophils Absolute: 0 10*3/uL (ref 0.0–0.1)
Basophils Relative: 0 %
Eosinophils Absolute: 0 10*3/uL (ref 0.0–0.5)
Eosinophils Relative: 1 %
HCT: 39.9 % (ref 36.0–46.0)
Hemoglobin: 13.2 g/dL (ref 12.0–15.0)
Immature Granulocytes: 0 %
Lymphocytes Relative: 18 %
Lymphs Abs: 1 10*3/uL (ref 0.7–4.0)
MCH: 30.4 pg (ref 26.0–34.0)
MCHC: 33.1 g/dL (ref 30.0–36.0)
MCV: 91.9 fL (ref 80.0–100.0)
Monocytes Absolute: 0.7 10*3/uL (ref 0.1–1.0)
Monocytes Relative: 12 %
Neutro Abs: 3.9 10*3/uL (ref 1.7–7.7)
Neutrophils Relative %: 69 %
Platelet Count: 181 10*3/uL (ref 150–400)
RBC: 4.34 MIL/uL (ref 3.87–5.11)
RDW: 13.3 % (ref 11.5–15.5)
WBC Count: 5.7 10*3/uL (ref 4.0–10.5)
nRBC: 0 % (ref 0.0–0.2)

## 2021-10-20 LAB — CMP (CANCER CENTER ONLY)
ALT: 19 U/L (ref 0–44)
AST: 25 U/L (ref 15–41)
Albumin: 4.1 g/dL (ref 3.5–5.0)
Alkaline Phosphatase: 51 U/L (ref 38–126)
Anion gap: 5 (ref 5–15)
BUN: 23 mg/dL (ref 8–23)
CO2: 30 mmol/L (ref 22–32)
Calcium: 9.2 mg/dL (ref 8.9–10.3)
Chloride: 102 mmol/L (ref 98–111)
Creatinine: 0.96 mg/dL (ref 0.44–1.00)
GFR, Estimated: >60 mL/min (ref >60)
Glucose, Bld: 100 mg/dL — ABNORMAL HIGH (ref 70–99)
Potassium: 4.2 mmol/L (ref 3.5–5.1)
Sodium: 137 mmol/L (ref 135–145)
Total Bilirubin: 0.4 mg/dL (ref 0.3–1.2)
Total Protein: 6.5 g/dL (ref 6.5–8.1)

## 2021-10-20 NOTE — Progress Notes (Signed)
?    [facility] ?[phone]   [phone] ? ?OFFICE PROGRESS NOTE ? ?[name], MD ?[place] ?[place] [address] ? 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 ? ?PRIOR THERAPY: ?1) Right VATS with right upper lobectomy with bronchoplasty closure and en bloc resection of a wedge from the superior segment of the right lower lobe in addition to mediastinal lymph node dissection on 07/30/2015 under the care of Dr. Roxan Hockey. ?2) Adjuvant systemic chemotherapy with cisplatin 75 MG/M2 and Alimta 500 MG/M2 every 3 weeks. First dose was given on 09/10/2015. Status post 4 cycles. ?3) adjuvant radiotherapy under the care of Dr. Tammi Klippel. ? ?CURRENT THERAPY: Tagrisso 80 mg p.o. daily started 12/01/2018.  Status post 34 months of treatment. ? ?INTERVAL HISTORY: ?[name] West 71 y.o. female returns to the clinic today for follow-up visit.  The patient is feeling fine today with no concerning complaints except for intermittent pain in the left hip area.  She denied having any current chest pain, shortness of breath, cough or hemoptysis.  She has no nausea, vomiting, diarrhea or constipation.  She has no headache or visual changes.  She has no significant weight loss or night sweats.  She enjoyed her recent travel to [place] and [place].  The patient is here today for evaluation and repeat blood work. ? ?MEDICAL HISTORY: ?Past Medical History:  ?Diagnosis Date  ? Anxiety   ? Arthritis   ? shoulders  ? Cancer ([place]) 06/2014  ? Lung Cancer  ? Complication of anesthesia   ? nausea, does well with PROPOFOL   ? Cough 09/17/2015  ? Encounter for antineoplastic chemotherapy 10/22/2015  ? Environmental and seasonal  allergies   ? Family history of adverse reaction to anesthesia   ? Patients grandmother died while having hand surgery; pt unsure of cause  ? Oral thrush 10/22/2015  ? Ovarian cyst   ? PONV (postoperative nausea and vomiting)   ? Raynaud disease   ? ? ?ALLERGIES:  is allergic to avelox [moxifloxacin hcl in nacl], cephalexin, cephalosporins, ciprofloxacin hcl, clindamycin/lincomycin, penicillins, shrimp [shellfish allergy], macrobid [nitrofurantoin monohyd macro], omnipaque [iohexol], azithromycin, fluconazole, adhesive [tape], lidoderm [lidocaine], pseudoephedrine, and sulfa antibiotics. ? ?MEDICATIONS:  ?Current Outpatient Medications  ?Medication Sig Dispense Refill  ? atorvastatin (LIPITOR) 20 MG tablet Take 1 tablet (20 mg total) by mouth at bedtime. 90 tablet 3  ? cholecalciferol (VITAMIN D) 1000 UNITS tablet Take 1,000 Units by mouth daily.    ? citalopram (CELEXA) 10 MG tablet Take 10 mg by mouth daily.    ? clobetasol ointment (TEMOVATE) 3.79 % 1 application    ? Clobetasol Prop Oint-Coal Tar (CLOBETAPLUS OINTMENT EX) Apply 1 Dose topically daily.    ? EPINEPHrine 0.3 mg/0.3 mL IJ SOAJ injection Inject into the muscle as directed. (Patient not taking: Reported on 08/19/2021)    ? estradiol (ESTRACE) 0.1 MG/GM vaginal cream See admin instructions.    ? ibuprofen (ADVIL,MOTRIN) 200 MG tablet Take 200 mg by mouth every 6 (six) hours as needed (pain).    ? levocetirizine (XYZAL) 5 MG tablet 1 tablet in the evening    ? LORazepam (ATIVAN) 0.5 MG tablet Take 0.5 mg by mouth at bedtime as needed for anxiety or sleep.     ?  Multiple Vitamin (MULTIVITAMIN WITH MINERALS) TABS Take 1 tablet by mouth daily.    ? NONFORMULARY OR COMPOUNDED ITEM Place 1 application vaginally 2 (two) times a week. Estradiol 0.02% Cream    ? osimertinib mesylate (TAGRISSO) 80 MG tablet TAKE 1 TABLET (80 MG TOTAL) BY MOUTH DAILY. 30 tablet 2  ? polyethylene glycol powder (GLYCOLAX/MIRALAX) 17 GM/SCOOP powder See admin instructions.    ?  predniSONE (DELTASONE) 50 MG tablet TAKE 1 TABLET BY MOUTH 13 HOURS PRIOR TO CT, 1 TABLET 7 HOURS PRIOR & 1 TABLET 1 HOUR PRIOR TO CT 3 tablet 3  ? Probiotic Product (ALIGN) 4 MG CAPS Take 1 capsule by mouth daily.    ? Wheat Dextrin (BENEFIBER) POWD See admin instructions.    ? ?No current facility-administered medications for this visit.  ? ? ?SURGICAL HISTORY:  ?Past Surgical History:  ?Procedure Laterality Date  ? BREAST EXCISIONAL BIOPSY Left   ? 1996  ? [place]  ? COLONOSCOPY WITH PROPOFOL N/A 09/27/2012  ? Procedure: COLONOSCOPY WITH PROPOFOL;  Surgeon: Garlan Fair, MD;  Location: [facility];  Service: Endoscopy;  Laterality: N/A;  ? Fibroid Removed    ? LAPAROTOMY Left 07/20/2016  ? Procedure: MINI LAPAROTOMY, REMOVAL OF LEFT OVARIAN CYST, LEFT SALPINGO-OOPHORECTOMY;  Surgeon: Dian Queen, MD;  Location: [facility];  Service: Gynecology;  Laterality: Left;  please moved to 7:30am if spot opens  ? Right Rotator Cuff Right   ? TOTAL SHOULDER ARTHROPLASTY Left 06/14/2018  ? Procedure: TOTAL SHOULDER ARTHROPLASTY;  Surgeon: Justice Britain, MD;  Location: [facility];  Service: Orthopedics;  Laterality: Left;  169mn  ? VIDEO ASSISTED THORACOSCOPY (VATS)/ LOBECTOMY Right 07/30/2015  ? Procedure: RIGHT VIDEO ASSISTED THORACOSCOPY (VATS)/RIGHT UPPER LOBECTOMY;  Surgeon: SMelrose Nakayama MD;  [facility]  Service: Thoracic;  Laterality: Right;  ? VIDEO BRONCHOSCOPY WITH ENDOBRONCHIAL NAVIGATION N/A 07/15/2015  ? Procedure: VIDEO BRONCHOSCOPY WITH ENDOBRONCHIAL NAVIGATION with Biopsy;  Surgeon: RCollene Gobble MD;  Location: [facility];  Service: Thoracic;  Laterality: N/A;  ? ? ?REVIEW OF SYSTEMS:  A comprehensive review of systems was negative except for: Musculoskeletal: positive for arthralgias  ? ?PHYSICAL EXAMINATION: General appearance: alert, cooperative, and no distress ?Head: Normocephalic, without obvious abnormality, atraumatic ?Neck: no adenopathy, no JVD, supple, symmetrical, trachea  midline, and thyroid not enlarged, symmetric, no tenderness/mass/nodules ?Lymph nodes: Cervical, supraclavicular, and axillary nodes normal. ?Resp: clear to auscultation bilaterally ?Back: symmetric, no curvature. ROM normal. No CVA tenderness. ?Cardio: regular rate and rhythm, S1, S2 normal, no murmur, click, rub or gallop ?GI: soft, non-tender; bowel sounds normal; no masses,  no organomegaly ?Extremities: extremities normal, atraumatic, no cyanosis or edema ? ?ECOG PERFORMANCE STATUS: 0 - Asymptomatic ? ?Blood pressure 121/68, pulse 72, resp. rate 16, weight 90 lb 6.4 oz (41 kg), SpO2 99 %.  ? ?LABORATORY DATA: ?Lab Results  ?Component Value Date  ? WBC 6.5 08/17/2021  ? HGB 14.0 08/17/2021  ? HCT 42.2 08/17/2021  ? MCV 90.8 08/17/2021  ? PLT 209 08/17/2021  ? ? ?  Chemistry   ?   ?Component Value Date/Time  ? NA 134 (L) 08/17/2021 0933  ? NA 139 06/16/2017 1132  ? K 5.2 (H) 08/17/2021 0933  ? K 4.7 06/16/2017 1132  ? CL 98 08/17/2021 0933  ? CO2 30 08/17/2021 0933  ? CO2 28 06/16/2017 1132  ? BUN 28 (H) 08/17/2021 0933  ? BUN 19.5 06/16/2017 1132  ? CREATININE 1.00 08/17/2021 0933  ? CREATININE 0.8  06/16/2017 1132  ?    ?Component Value Date/Time  ? CALCIUM 10.3 08/17/2021 0933  ? CALCIUM 10.0 06/16/2017 1132  ? ALKPHOS 57 08/17/2021 0933  ? ALKPHOS 68 06/16/2017 1132  ? AST 23 08/17/2021 0933  ? AST 21 06/16/2017 1132  ? ALT 18 08/17/2021 0933  ? ALT 18 06/16/2017 1132  ? BILITOT 0.5 08/17/2021 0933  ? BILITOT 0.50 06/16/2017 1132  ?  ? ? ? ?RADIOGRAPHIC STUDIES: ?No results found. ? ?ASSESSMENT AND PLAN:  ?This is a very pleasant 71 years old white female with recurrent non-small cell lung cancer initially diagnosed as stage IIIA non-small cell lung cancer, adenocarcinoma with positive EGFR mutation in exon 19 status post right upper lobectomy with lymph node dissection followed by adjuvant systemic chemotherapy as well as adjuvant radiation. ?The patient has been on observation for almost 3 years.    ?Unfortunately her scan in June 2020 showed multiple tiny pulmonary nodules scattered throughout both lungs which many of them have increased in size and some new nodule suspected the previous scan.  ?The patient started

## 2021-11-02 ENCOUNTER — Other Ambulatory Visit (HOSPITAL_COMMUNITY): Payer: Self-pay

## 2021-11-02 ENCOUNTER — Other Ambulatory Visit: Payer: Self-pay | Admitting: Physician Assistant

## 2021-11-02 DIAGNOSIS — C3491 Malignant neoplasm of unspecified part of right bronchus or lung: Secondary | ICD-10-CM

## 2021-11-03 ENCOUNTER — Other Ambulatory Visit: Payer: Self-pay | Admitting: Physician Assistant

## 2021-11-03 ENCOUNTER — Other Ambulatory Visit (HOSPITAL_COMMUNITY): Payer: Self-pay

## 2021-11-03 DIAGNOSIS — C3491 Malignant neoplasm of unspecified part of right bronchus or lung: Secondary | ICD-10-CM

## 2021-11-03 MED ORDER — OSIMERTINIB MESYLATE 80 MG PO TABS
ORAL_TABLET | Freq: Every day | ORAL | 2 refills | Status: DC
  Filled 2021-11-03: qty 30, 30d supply, fill #0
  Filled 2021-11-17: qty 30, 30d supply, fill #1
  Filled 2021-12-30: qty 30, 30d supply, fill #2
  Filled 2022-01-31: qty 30, 30d supply, fill #3
  Filled 2022-02-25: qty 30, 30d supply, fill #4
  Filled 2022-03-28: qty 30, 30d supply, fill #5

## 2021-11-03 MED ORDER — OSIMERTINIB MESYLATE 80 MG PO TABS: ORAL_TABLET | Freq: Every day | ORAL | 2 refills | Status: DC

## 2021-11-05 ENCOUNTER — Other Ambulatory Visit (HOSPITAL_COMMUNITY): Payer: Self-pay

## 2021-11-09 ENCOUNTER — Other Ambulatory Visit (HOSPITAL_COMMUNITY): Payer: Self-pay

## 2021-11-15 ENCOUNTER — Other Ambulatory Visit (HOSPITAL_COMMUNITY): Payer: Self-pay

## 2021-11-17 ENCOUNTER — Other Ambulatory Visit (HOSPITAL_COMMUNITY): Payer: Self-pay

## 2021-11-29 ENCOUNTER — Other Ambulatory Visit (HOSPITAL_COMMUNITY): Payer: Self-pay

## 2021-12-13 ENCOUNTER — Telehealth: Payer: Self-pay

## 2021-12-13 DIAGNOSIS — U071 COVID-19: Secondary | ICD-10-CM | POA: Diagnosis not present

## 2021-12-13 NOTE — Telephone Encounter (Signed)
Pt called and states she needs to reschedule her upcoming appointments on 7/12 and 7/13 due to testing positive for covid. This LPN gave the pt the # for central scheduling [phone]) and also the schedulers # for today, [phone]. Pt thanks this LPN and knows she can call back if she has any trouble with rescheduling.

## 2021-12-22 ENCOUNTER — Ambulatory Visit (HOSPITAL_COMMUNITY)
Admission: RE | Admit: 2021-12-22 | Discharge: 2021-12-22 | Disposition: A | Discharge status: Home / Self Care | Payer: Medicare Other | Source: Ambulatory Visit | Attending: Internal Medicine | Admitting: Internal Medicine

## 2021-12-22 ENCOUNTER — Inpatient Hospital Stay: Payer: Medicare Other | Attending: Internal Medicine

## 2021-12-22 ENCOUNTER — Other Ambulatory Visit: Payer: Self-pay

## 2021-12-22 ENCOUNTER — Other Ambulatory Visit (HOSPITAL_COMMUNITY): Payer: Medicare Other

## 2021-12-22 DIAGNOSIS — R911 Solitary pulmonary nodule: Secondary | ICD-10-CM | POA: Diagnosis not present

## 2021-12-22 DIAGNOSIS — I700 Atherosclerosis of aorta: Secondary | ICD-10-CM | POA: Diagnosis not present

## 2021-12-22 DIAGNOSIS — Z8616 Personal history of COVID-19: Secondary | ICD-10-CM | POA: Insufficient documentation

## 2021-12-22 DIAGNOSIS — R918 Other nonspecific abnormal finding of lung field: Secondary | ICD-10-CM | POA: Diagnosis not present

## 2021-12-22 DIAGNOSIS — C3490 Malignant neoplasm of unspecified part of unspecified bronchus or lung: Secondary | ICD-10-CM | POA: Insufficient documentation

## 2021-12-22 DIAGNOSIS — C3411 Malignant neoplasm of upper lobe, right bronchus or lung: Secondary | ICD-10-CM | POA: Insufficient documentation

## 2021-12-22 DIAGNOSIS — Z79899 Other long term (current) drug therapy: Secondary | ICD-10-CM | POA: Insufficient documentation

## 2021-12-22 LAB — CBC WITH DIFFERENTIAL (CANCER CENTER ONLY)
Abs Immature Granulocytes: 0.03 10*3/uL (ref 0.00–0.07)
Basophils Absolute: 0 10*3/uL (ref 0.0–0.1)
Basophils Relative: 0 %
Eosinophils Absolute: 0 10*3/uL (ref 0.0–0.5)
Eosinophils Relative: 0 %
HCT: 41.1 % (ref 36.0–46.0)
Hemoglobin: 13.9 g/dL (ref 12.0–15.0)
Immature Granulocytes: 0 %
Lymphocytes Relative: 8 %
Lymphs Abs: 0.8 10*3/uL (ref 0.7–4.0)
MCH: 30.8 pg (ref 26.0–34.0)
MCHC: 33.8 g/dL (ref 30.0–36.0)
MCV: 91.1 fL (ref 80.0–100.0)
Monocytes Absolute: 0.2 10*3/uL (ref 0.1–1.0)
Monocytes Relative: 2 %
Neutro Abs: 9.7 10*3/uL — ABNORMAL HIGH (ref 1.7–7.7)
Neutrophils Relative %: 90 %
Platelet Count: 253 10*3/uL (ref 150–400)
RBC: 4.51 MIL/uL (ref 3.87–5.11)
RDW: 12.9 % (ref 11.5–15.5)
WBC Count: 10.7 10*3/uL — ABNORMAL HIGH (ref 4.0–10.5)
nRBC: 0 % (ref 0.0–0.2)

## 2021-12-22 LAB — CMP (CANCER CENTER ONLY)
ALT: 22 U/L (ref 0–44)
AST: 25 U/L (ref 15–41)
Albumin: 4.7 g/dL (ref 3.5–5.0)
Alkaline Phosphatase: 57 U/L (ref 38–126)
Anion gap: 9 (ref 5–15)
BUN: 29 mg/dL — ABNORMAL HIGH (ref 8–23)
CO2: 27 mmol/L (ref 22–32)
Calcium: 9.7 mg/dL (ref 8.9–10.3)
Chloride: 99 mmol/L (ref 98–111)
Creatinine: 0.95 mg/dL (ref 0.44–1.00)
GFR, Estimated: >60 mL/min (ref >60)
Glucose, Bld: 210 mg/dL — ABNORMAL HIGH (ref 70–99)
Potassium: 4 mmol/L (ref 3.5–5.1)
Sodium: 135 mmol/L (ref 135–145)
Total Bilirubin: 0.4 mg/dL (ref 0.3–1.2)
Total Protein: 7.4 g/dL (ref 6.5–8.1)

## 2021-12-22 MED ORDER — IOHEXOL 300 MG/ML  SOLN
75.0000 mL | Freq: Once | INTRAMUSCULAR | Status: AC | PRN
  Administered 2021-12-22: 75 mL via INTRAVENOUS

## 2021-12-22 MED ORDER — SODIUM CHLORIDE (PF) 0.9 % IJ SOLN
INTRAMUSCULAR | Status: AC
  Filled 2021-12-22: qty 50

## 2021-12-23 ENCOUNTER — Encounter: Payer: Self-pay | Admitting: Internal Medicine

## 2021-12-23 ENCOUNTER — Inpatient Hospital Stay (HOSPITAL_BASED_OUTPATIENT_CLINIC_OR_DEPARTMENT_OTHER): Payer: Medicare Other | Admitting: Internal Medicine

## 2021-12-23 VITALS — BP 106/49 | HR 84 | Temp 97.7°F | Resp 16 | Wt 91.4 lb

## 2021-12-23 DIAGNOSIS — C3411 Malignant neoplasm of upper lobe, right bronchus or lung: Secondary | ICD-10-CM | POA: Diagnosis not present

## 2021-12-23 DIAGNOSIS — Z8616 Personal history of COVID-19: Secondary | ICD-10-CM | POA: Diagnosis not present

## 2021-12-23 DIAGNOSIS — Z79899 Other long term (current) drug therapy: Secondary | ICD-10-CM | POA: Diagnosis not present

## 2021-12-23 NOTE — Progress Notes (Signed)
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  PRIOR THERAPY: 1) Right VATS with right upper lobectomy with bronchoplasty closure and en bloc resection of a wedge from the superior segment of the right lower lobe in addition to mediastinal lymph node dissection on 07/30/2015 under the care of Dr. Roxan Hockey. 2) Adjuvant systemic chemotherapy with cisplatin 75 MG/M2 and Alimta 500 MG/M2 every 3 weeks. First dose was given on 09/10/2015. Status post 4 cycles. 3) adjuvant radiotherapy under the care of Dr. Tammi Klippel.  CURRENT THERAPY: Tagrisso 80 mg p.o. daily started 12/01/2018.  Status post 36 months of treatment.  INTERVAL HISTORY: [name] West 71 y.o. female returns to the clinic today for follow-up visit.  The patient is feeling fine today with no concerning complaints.  She was diagnosed with COVID-19 few weeks ago when she was in [place].  She was treated with the Paxlovid.  She denied having any current chest pain, shortness of breath, cough or hemoptysis.  She has no nausea, vomiting, diarrhea or constipation.  She has no headache or visual changes.  She denied having any skin rash.  She has no fever or chills.  She has been tolerating her treatment with Tagrisso fairly well.  The patient had repeat CT scan of the chest, abdomen pelvis performed recently and she is here for evaluation and discussion of her scan results.  MEDICAL HISTORY: Past Medical History:  Diagnosis Date   Anxiety    Arthritis    shoulders   Cancer ([facility]) 06/2014   Lung Cancer    Complication of anesthesia    nausea, does well with PROPOFOL    Cough 09/17/2015   Encounter for antineoplastic chemotherapy 10/22/2015   Environmental and seasonal allergies    Family history of adverse reaction to anesthesia    Patients grandmother died while having hand surgery; pt unsure of cause   Oral thrush 10/22/2015   Ovarian cyst    PONV (postoperative nausea and vomiting)    Raynaud disease     ALLERGIES:  is allergic to avelox [moxifloxacin hcl in nacl], cephalexin, cephalosporins, ciprofloxacin hcl, clindamycin/lincomycin, penicillins, shrimp [shellfish allergy], macrobid [nitrofurantoin monohyd macro], omnipaque [iohexol], azithromycin, fluconazole, adhesive [tape], lidoderm [lidocaine], pseudoephedrine, and sulfa antibiotics.  MEDICATIONS:  Current Outpatient Medications  Medication Sig Dispense Refill   atorvastatin (LIPITOR) 20 MG tablet Take 1 tablet (20 mg total) by mouth at bedtime. 90 tablet 3   cholecalciferol (VITAMIN D) 1000 UNITS tablet Take 1,000 Units by mouth daily.     citalopram (CELEXA) 10 MG tablet Take 10 mg by mouth daily.     clobetasol ointment (TEMOVATE) 2.69 % 1 application     Clobetasol Prop Oint-Coal Tar (CLOBETAPLUS OINTMENT EX) Apply 1 Dose topically daily.     EPINEPHrine 0.3 mg/0.3 mL IJ SOAJ injection Inject into the muscle as directed. (Patient not taking: Reported on 08/19/2021)     estradiol (ESTRACE) 0.1 MG/GM vaginal cream See admin instructions.     ibuprofen (ADVIL,MOTRIN) 200 MG tablet Take 200 mg by mouth every 6 (six) hours as needed (pain).     levocetirizine (XYZAL) 5 MG tablet 1  tablet in the evening     LORazepam (ATIVAN) 0.5 MG tablet Take 0.5 mg by mouth at bedtime as needed for anxiety or sleep.      Multiple Vitamin (MULTIVITAMIN WITH MINERALS) TABS Take 1 tablet by mouth daily.     NONFORMULARY OR COMPOUNDED ITEM Place 1 application vaginally 2 (two) times a week. Estradiol 0.02% Cream     osimertinib mesylate (TAGRISSO) 80 MG  tablet TAKE 1 TABLET (80 MG TOTAL) BY MOUTH DAILY. 60 tablet 2   polyethylene glycol powder (GLYCOLAX/MIRALAX) 17 GM/SCOOP powder See admin instructions.     predniSONE (DELTASONE) 50 MG tablet TAKE 1 TABLET BY MOUTH 13 HOURS PRIOR TO CT, 1 TABLET 7 HOURS PRIOR & 1 TABLET 1 HOUR PRIOR TO CT 3 tablet 3   Probiotic Product (ALIGN) 4 MG CAPS Take 1 capsule by mouth daily.     Wheat Dextrin (BENEFIBER) POWD See admin instructions.     No current facility-administered medications for this visit.    SURGICAL HISTORY:  Past Surgical History:  Procedure Laterality Date   BREAST EXCISIONAL BIOPSY Left    [place]   COLONOSCOPY WITH PROPOFOL N/A 09/27/2012   Procedure: COLONOSCOPY WITH PROPOFOL;  Surgeon: Garlan Fair, MD;  Location: [facility];  Service: Endoscopy;  Laterality: N/A;   Fibroid Removed     LAPAROTOMY Left 07/20/2016   Procedure: MINI LAPAROTOMY, REMOVAL OF LEFT OVARIAN CYST, LEFT SALPINGO-OOPHORECTOMY;  Surgeon: Dian Queen, MD;  Location: [facility];  Service: Gynecology;  Laterality: Left;  please moved to 7:30am if spot opens   Right Rotator Cuff Right    TOTAL SHOULDER ARTHROPLASTY Left 06/14/2018   Procedure: TOTAL SHOULDER ARTHROPLASTY;  Surgeon: Justice Britain, MD;  Location: [facility];  Service: Orthopedics;  Laterality: Left;  151mn   VIDEO ASSISTED THORACOSCOPY (VATS)/ LOBECTOMY Right 07/30/2015   Procedure: RIGHT VIDEO ASSISTED THORACOSCOPY (VATS)/RIGHT UPPER LOBECTOMY;  Surgeon: SMelrose Nakayama MD;  [facility]  Service: Thoracic;  Laterality: Right;   VIDEO BRONCHOSCOPY WITH ENDOBRONCHIAL NAVIGATION N/A 07/15/2015   Procedure: VIDEO BRONCHOSCOPY WITH ENDOBRONCHIAL NAVIGATION with Biopsy;  Surgeon: RCollene Gobble MD;  Location[facility]  Service: Thoracic;  Laterality: N/A;    REVIEW OF SYSTEMS:  Constitutional: negative Eyes: negative Ears, nose, mouth, throat, and face: negative Respiratory: negative Cardiovascular:  negative Gastrointestinal: negative Genitourinary:negative Integument/breast: negative Hematologic/lymphatic: negative Musculoskeletal:negative Neurological: negative Behavioral/Psych: negative Endocrine: negative Allergic/Immunologic: negative   PHYSICAL EXAMINATION: General appearance: alert, cooperative, and no distress Head: Normocephalic, without obvious abnormality, atraumatic Neck: no adenopathy, no JVD, supple, symmetrical, trachea midline, and thyroid not enlarged, symmetric, no tenderness/mass/nodules Lymph nodes: Cervical, supraclavicular, and axillary nodes normal. Resp: clear to auscultation bilaterally Back: symmetric, no curvature. ROM normal. No CVA tenderness. Cardio: regular rate and rhythm, S1, S2 normal, no murmur, click, rub or gallop GI: soft, non-tender; bowel sounds normal; no masses,  no organomegaly Extremities: extremities normal, atraumatic, no cyanosis or edema Neurologic: Alert and oriented X 3, normal strength and tone. Normal symmetric reflexes. Normal coordination and gait  ECOG PERFORMANCE STATUS: 0 - Asymptomatic  Blood pressure (!) 106/49, pulse 84, temperature 97.7 F (36.5 C), temperature source Oral, resp. rate 16, weight 91 lb 6 oz (41.4 kg), SpO2 100 %.   LABORATORY DATA: Lab Results  Component Value Date   WBC 10.7 (H) 12/22/2021   HGB 13.9 12/22/2021   HCT 41.1 12/22/2021   MCV 91.1 12/22/2021   PLT 253 12/22/2021  Chemistry      Component Value Date/Time   NA 135 12/22/2021 0955   NA 139 06/16/2017 1132   K 4.0 12/22/2021 0955   K 4.7 06/16/2017 1132   CL 99 12/22/2021 0955   CO2 27 12/22/2021 0955   CO2 28 06/16/2017 1132   BUN 29 (H) 12/22/2021 0955   BUN 19.5 06/16/2017 1132   CREATININE 0.95 12/22/2021 0955   CREATININE 0.8 06/16/2017 1132      Component Value Date/Time   CALCIUM 9.7 12/22/2021 0955   CALCIUM 10.0 06/16/2017 1132   ALKPHOS 57 12/22/2021 0955   ALKPHOS 68 06/16/2017 1132   AST 25 12/22/2021  0955   AST 21 06/16/2017 1132   ALT 22 12/22/2021 0955   ALT 18 06/16/2017 1132   BILITOT 0.4 12/22/2021 0955   BILITOT 0.50 06/16/2017 1132       RADIOGRAPHIC STUDIES: No results found.  ASSESSMENT AND PLAN:  This is a very pleasant 71 years old white female with recurrent non-small cell lung cancer initially diagnosed as stage IIIA non-small cell lung cancer, adenocarcinoma with positive EGFR mutation in exon 19 status post right upper lobectomy with lymph node dissection followed by adjuvant systemic chemotherapy as well as adjuvant radiation. The patient has been on observation for almost 3 years.   Unfortunately her scan in June 2020 showed multiple tiny pulmonary nodules scattered throughout both lungs which many of them have increased in size and some new nodule suspected the previous scan.  The patient started treatment with Tagrisso 80 mg p.o. daily status post 36 months of treatment.   The patient has been tolerating this treatment well with no significant adverse effects. She had repeat CT scan of the chest, abdomen and pelvis performed yesterday. I personally and independently reviewed the scan images and discussed the result with the patient today.  The final report is still pending. Her scan showed no clear evidence for disease progression but I will wait for the final report for confirmation. I recommended for her to continue her current treatment with Tagrisso with the same dose. I will see her back for follow-up visit in 2 months for evaluation and repeat blood work. The patient was advised to call immediately if she has any other concerning symptoms in the interval. The patient voices understanding of current disease status and treatment options and is in agreement with the current care plan. All questions were answered. The patient knows to call the clinic with any problems, questions or concerns. We can certainly see the patient much sooner if necessary.  Disclaimer:  This note was dictated with voice recognition software. Similar sounding words can inadvertently be transcribed and may not be corrected upon review.

## 2021-12-30 ENCOUNTER — Other Ambulatory Visit (HOSPITAL_COMMUNITY): Payer: Self-pay

## 2022-01-05 DIAGNOSIS — L821 Other seborrheic keratosis: Secondary | ICD-10-CM | POA: Diagnosis not present

## 2022-01-05 DIAGNOSIS — D485 Neoplasm of uncertain behavior of skin: Secondary | ICD-10-CM | POA: Diagnosis not present

## 2022-01-05 DIAGNOSIS — D225 Melanocytic nevi of trunk: Secondary | ICD-10-CM | POA: Diagnosis not present

## 2022-01-05 DIAGNOSIS — L814 Other melanin hyperpigmentation: Secondary | ICD-10-CM | POA: Diagnosis not present

## 2022-01-05 DIAGNOSIS — Z86018 Personal history of other benign neoplasm: Secondary | ICD-10-CM | POA: Diagnosis not present

## 2022-01-05 DIAGNOSIS — D2271 Melanocytic nevi of right lower limb, including hip: Secondary | ICD-10-CM | POA: Diagnosis not present

## 2022-01-10 ENCOUNTER — Other Ambulatory Visit (HOSPITAL_COMMUNITY): Payer: Self-pay

## 2022-01-12 DIAGNOSIS — E785 Hyperlipidemia, unspecified: Secondary | ICD-10-CM | POA: Diagnosis not present

## 2022-01-13 ENCOUNTER — Encounter (HOSPITAL_BASED_OUTPATIENT_CLINIC_OR_DEPARTMENT_OTHER): Payer: Self-pay | Admitting: Internal Medicine

## 2022-01-13 LAB — NMR, LIPOPROFILE
Cholesterol, Total: 177 mg/dL (ref 100–199)
HDL Particle Number: 36.3 umol/L (ref >=30.5)
HDL-C: 90 mg/dL (ref >39)
LDL Particle Number: 545 nmol/L (ref <1000)
LDL Size: 21.1 nm (ref >20.5)
LDL-C (NIH Calc): 70 mg/dL (ref 0–99)
LP-IR Score: <25 (ref <=45)
Small LDL Particle Number: <90 nmol/L (ref <=527)
Triglycerides: 94 mg/dL (ref 0–149)

## 2022-01-14 ENCOUNTER — Other Ambulatory Visit (HOSPITAL_COMMUNITY): Payer: Self-pay

## 2022-01-18 ENCOUNTER — Other Ambulatory Visit: Payer: Self-pay | Admitting: Family Medicine

## 2022-01-18 DIAGNOSIS — Z1231 Encounter for screening mammogram for malignant neoplasm of breast: Secondary | ICD-10-CM

## 2022-01-21 ENCOUNTER — Encounter (HOSPITAL_BASED_OUTPATIENT_CLINIC_OR_DEPARTMENT_OTHER): Payer: Self-pay | Admitting: Internal Medicine

## 2022-01-21 ENCOUNTER — Ambulatory Visit (INDEPENDENT_AMBULATORY_CARE_PROVIDER_SITE_OTHER): Payer: Medicare Other | Admitting: Internal Medicine

## 2022-01-21 VITALS — BP 100/56 | HR 70 | Ht 59.0 in | Wt 90.9 lb

## 2022-01-21 DIAGNOSIS — E785 Hyperlipidemia, unspecified: Secondary | ICD-10-CM

## 2022-01-21 DIAGNOSIS — I2584 Coronary atherosclerosis due to calcified coronary lesion: Secondary | ICD-10-CM

## 2022-01-21 DIAGNOSIS — I2510 Atherosclerotic heart disease of native coronary artery without angina pectoris: Secondary | ICD-10-CM | POA: Diagnosis not present

## 2022-01-21 NOTE — Progress Notes (Unsigned)
OFFICE CONSULT NOTE  Chief Complaint:  Follow-up  Primary Care Physician: [name], MD  HPI:  [name] West is a 71 y.o. female who is being seen today for the evaluation of coronary artery calcification at the request of [name], MD.  This is a pleasant 71 year old female with unfortunate history of non-small cell lung cancer.  She underwent lobectomy by Dr. Roxan Hockey in 2017 with radiation chemotherapy.  Fortunately she seems to be cancer free with some recurrent scans.  Recently she is developed some inguinal lymphadenopathy.  She is on antibiotics for this however her is concerned about a neoplastic process.  Her most recent screening CT scan of the chest did demonstrate coronary artery calcification both in the LAD as well as aortic atherosclerosis which was calcified.  Family history is significant for a brother 17 years older who has coronary artery disease and has had an MI and CABG (however she notes he is live with HIV disease on HAART for more than 30 years).  She denies any history of diabetes or hypertension in fact does not take any daily medications.  Her last lipid profile was November 2018 which showed total cholesterol 210, HDL 81, triglycerides 65 and LDL 116.  She is asymptomatic from an anginal standpoint, but does report shortness of breath with exertion.  She felt like this may mostly be related to her lung cancer and the fact that she has had partial lung removal.  12/22/2017  [name] West returns today for follow-up of her stress echocardiogram.  Fortunately this was a negative study without any exercise-induced wall motion abnormalities and normal LV function.  Overall she feels well except for ongoing shoulder problems.  She is scheduled for sold shoulder surgery in January.  She is supposed to have another CT scan of her chest in December.  We discussed the fact that she does have coronary disease as evidenced by her coronary artery calcifications.   Although her stress test was low risk, her goal LDL is less than 70.  Currently her LDL is 116.  Given her body habitus, thin weight and fairly healthy diet, is unlikely she is going get a significant increased improvement in her numbers.  I did repeat a lipid profile which was an NMR.  Her LDL-C is 110, and her LDL-P is 1151.  Total cholesterol is 219 and LPA was negative at 21.   03/14/2019  [name] West is seen today in follow-up.  She did undergo successful left shoulder replacement.  Overall she is pleased with that.  She is scheduled to have another CT scan in November of her chest for follow-up of cancer.  This had shown coronary calcification and I recommended statin therapy.  She has been somewhat hesitant to do that.  Recently we repeated an NMR lipid profile.  Her total particle number went from 1151 down to 854.  LDL still remains elevated 102 and HDL was 100, triglycerides 57.  03/25/2020  [name] West returns today for annual follow-up.  Overall she continues to do well.  Denies any chest pain or shortness of breath.  She has had no recurrence of her lung cancer as of recent CT scan.  She does have some LAD atherosclerosis.  Her NMR LDL-P was 844, total cholesterol 197, triglycerides 92, HDL 85 and LDL of 96.  Overall representing good control over her lipids.  She is not currently on any therapy.  06/28/2021  [name] West returns today for follow-up.  She reported she had  a viral illness over the holidays and had lost significant amount of weight.  She was actually underweight and weight 85 pounds.  She then intentionally ate a lot of potentially fattening foods in order to get her weight up to now 91 pounds with a BMI of 18.  She thinks this is responsible for an increase in her lipids which were measured on January 5.  Total cholesterol 197, triglycerides 101, HDL 71 and LDL 108.   PMHx:  Past Medical History:  Diagnosis Date   Anxiety    Arthritis    shoulders   Cancer ([place]) 06/2014    Lung Cancer   Complication of anesthesia    nausea, does well with PROPOFOL    Cough 09/17/2015   Encounter for antineoplastic chemotherapy 10/22/2015   Environmental and seasonal allergies    Family history of adverse reaction to anesthesia    Patients grandmother died while having hand surgery; pt unsure of cause   Oral thrush 10/22/2015   Ovarian cyst    PONV (postoperative nausea and vomiting)    Raynaud disease     Past Surgical History:  Procedure Laterality Date   BREAST EXCISIONAL BIOPSY Left    [place]   COLONOSCOPY WITH PROPOFOL N/A 09/27/2012   Procedure: COLONOSCOPY WITH PROPOFOL;  Surgeon: Garlan Fair, MD;  Location: [facility];  Service: Endoscopy;  Laterality: N/A;   Fibroid Removed     LAPAROTOMY Left 07/20/2016   Procedure: MINI LAPAROTOMY, REMOVAL OF LEFT OVARIAN CYST, LEFT SALPINGO-OOPHORECTOMY;  Surgeon: Dian Queen, MD;  Location: [facility];  Service: Gynecology;  Laterality: Left;  please moved to 7:30am if spot opens   Right Rotator Cuff Right    TOTAL SHOULDER ARTHROPLASTY Left 06/14/2018   Procedure: TOTAL SHOULDER ARTHROPLASTY;  Surgeon: Justice Britain, MD;  Location: [facility];  Service: Orthopedics;  Laterality: Left;  141min   VIDEO ASSISTED THORACOSCOPY (VATS)/ LOBECTOMY Right 07/30/2015   Procedure: RIGHT VIDEO ASSISTED THORACOSCOPY (VATS)/RIGHT UPPER LOBECTOMY;  Surgeon: Melrose Nakayama, MD;  Location: [facility];  Service: Thoracic;  Laterality: Right;   VIDEO BRONCHOSCOPY WITH ENDOBRONCHIAL NAVIGATION N/A 07/15/2015   Procedure: VIDEO BRONCHOSCOPY WITH ENDOBRONCHIAL NAVIGATION with Biopsy;  Surgeon: Collene Gobble, MD;  [facility];  Service: Thoracic;  Laterality: N/A;    FAMHx:  Family History  Problem Relation Age of Onset   Heart disease Brother    Heart attack Brother    Heart disease Brother 51       elevated CAC score    SOCHx:   reports that she has never smoked. She has never used smokeless tobacco. She reports  current alcohol use. She reports that she does not use drugs.  ALLERGIES:  Allergies  Allergen Reactions   Avelox [Moxifloxacin Hcl In Nacl] Anaphylaxis   Cephalexin Anaphylaxis   Cephalosporins Anaphylaxis   Ciprofloxacin Hcl Shortness Of Breath   Clindamycin/Lincomycin Anaphylaxis   Penicillins Anaphylaxis and Other (See Comments)    Has patient had a PCN reaction causing immediate rash, facial/tongue/throat swelling, SOB or lightheadedness with hypotension: Yes Has patient had a PCN reaction causing severe rash involving mucus membranes or skin necrosis: No Has patient had a PCN reaction that required hospitalization Yes Has patient had a PCN reaction occurring within the last 10 years: No If all of the above answers are "NO", then may proceed with Cephalosporin use.    Shrimp [Shellfish Allergy] Diarrhea    "violent reaction to :shrimp and scallops but not  other shell fish-vomiting and diarrhea"   Macrobid [Nitrofurantoin Monohyd Macro] Other (See Comments)    CHEST TIGHTNESS...Marland KitchenHAD USED BEFORE WITH NO REACTION   Omnipaque [Iohexol] Hives   Azithromycin Other (See Comments)   Fluconazole Nausea And Vomiting   Adhesive [Tape] Rash   Lidoderm [Lidocaine] Rash   Pseudoephedrine Palpitations   Sulfa Antibiotics Rash    ROS: Pertinent items noted in HPI and remainder of comprehensive ROS otherwise negative.  HOME MEDS: Current Outpatient Medications on File Prior to Visit  Medication Sig Dispense Refill   atorvastatin (LIPITOR) 20 MG tablet Take 1 tablet (20 mg total) by mouth at bedtime. 90 tablet 3   cholecalciferol (VITAMIN D) 1000 UNITS tablet Take 1,000 Units by mouth daily.     citalopram (CELEXA) 10 MG tablet Take 10 mg by mouth daily.     clobetasol ointment (TEMOVATE) 7.41 % 1 application     Clobetasol Prop Oint-Coal Tar (CLOBETAPLUS OINTMENT EX) Apply 1 Dose topically daily.     EPINEPHrine 0.3 mg/0.3 mL IJ SOAJ injection Inject into the muscle as directed.      estradiol (ESTRACE) 0.1 MG/GM vaginal cream See admin instructions.     ibuprofen (ADVIL,MOTRIN) 200 MG tablet Take 200 mg by mouth every 6 (six) hours as needed (pain).     levocetirizine (XYZAL) 5 MG tablet 1 tablet in the evening     LORazepam (ATIVAN) 0.5 MG tablet Take 0.5 mg by mouth at bedtime as needed for anxiety or sleep.      Multiple Vitamin (MULTIVITAMIN WITH MINERALS) TABS Take 1 tablet by mouth daily.     NONFORMULARY OR COMPOUNDED ITEM Place 1 application vaginally 2 (two) times a week. Estradiol 0.02% Cream     osimertinib mesylate (TAGRISSO) 80 MG tablet TAKE 1 TABLET (80 MG TOTAL) BY MOUTH DAILY. 60 tablet 2   polyethylene glycol powder (GLYCOLAX/MIRALAX) 17 GM/SCOOP powder See admin instructions.     predniSONE (DELTASONE) 50 MG tablet TAKE 1 TABLET BY MOUTH 13 HOURS PRIOR TO CT, 1 TABLET 7 HOURS PRIOR & 1 TABLET 1 HOUR PRIOR TO CT 3 tablet 3   Probiotic Product (ALIGN) 4 MG CAPS Take 1 capsule by mouth daily.     Wheat Dextrin (BENEFIBER) POWD See admin instructions.     No current facility-administered medications on file prior to visit.    LABS/IMAGING: No results found for this or any previous visit (from the past 48 hour(s)).  No results found.   LIPID PANEL: No results found for: "CHOL", "TRIG", "HDL", "CHOLHDL", "VLDL", "LDLCALC", "LDLDIRECT"  WEIGHTS: Wt Readings from Last 3 Encounters:  01/21/22 90 lb 14.4 oz (41.2 kg)  12/23/21 91 lb 6 oz (41.4 kg)  10/20/21 90 lb 6.4 oz (41 kg)    VITALS: BP (!) 100/56   Pulse 70   Ht 4\' 11"  (1.499 m)   Wt 90 lb 14.4 oz (41.2 kg)   SpO2 98%   BMI 18.36 kg/m   EXAM: General appearance: alert and no distress Neck: no carotid bruit, no JVD and thyroid not enlarged, symmetric, no tenderness/mass/nodules Lungs: clear to auscultation bilaterally Heart: regular rate and rhythm, S1, S2 normal, no murmur, click, rub or gallop Abdomen: soft, non-tender; bowel sounds normal; no masses,  no organomegaly Extremities:  extremities normal, atraumatic, no cyanosis or edema Pulses: 2+ and symmetric Skin: Skin color, texture, turgor normal. No rashes or lesions Neurologic: Grossly normal Psych: Pleasant  EKG: Normal sinus rhythm 86-personally reviewed  ASSESSMENT: Coronary artery calcification-LAD and aortic atherosclerosis (  negative stress echocardiogram (03/2018)) History of non-small cell adenocarcinoma (status post lobectomy-2017) Family history of coronary artery disease Dyslipidemia-goal LDL less than 70  PLAN: 1.   [name] West has had recent increase in her lipids.  She thinks this may have been related to abnormal diet in order to try to gain weight back after an illness over the holidays.  She was noted to have some coronary artery calcification in the LAD and has regular CT scans for follow-up of lung cancer.  Guidelines recommend statin therapy although she has had a high HDL cholesterol.  I am concerned about her family history of heart disease and still feels she is above her target LDL.  She would like a repeat lipid profile because of her abnormal diet and we will go ahead and arrange that in 3 months.  If she remains above target I would recommend least a low-dose statin to try to get her to target LDL which I would recommend at 70 or lower.  She seems like she would be agreeable to that after discussion about the potential benefits of statin therapy.  Follow-up with me in 6 months or sooner as necessary  Pixie Casino, MD, [facility] [address], [place] Director of the Advanced Lipid Disorders &  Cardiovascular Risk Reduction Clinic Diplomate of the American Board of Clinical Lipidology Attending Cardiologist  Direct Dial: [phone]  Fax: [phone]  Website:  [email or web address] Shene Maxfield 01/21/2022, 11:12 AM

## 2022-01-21 NOTE — Patient Instructions (Addendum)
Medication Instructions:  HOLD atorvastatin for 1 week  Send an update on your symptoms   *If you need a refill on your cardiac medications before your next appointment, please call your pharmacy*   Lab Work: FASTING lab work to check cholesterol in about 6 months   If you have labs (blood work) drawn today and your tests are completely normal, you will receive your results only by: [place] (if you have MyChart) OR A paper copy in the mail If you have any lab test that is abnormal or we need to change your treatment, we will call you to review the results.   Follow-Up: At [facility], you and your health needs are our priority.  As part of our continuing mission to provide you with exceptional heart care, we have created designated Provider Care Teams.  These Care Teams include your primary Cardiologist (physician) and Advanced Practice Providers (APPs -  Physician Assistants and Nurse Practitioners) who all work together to provide you with the care you need, when you need it.  We recommend signing up for the patient portal called "MyChart".  Sign up information is provided on this After Visit Summary.  MyChart is used to connect with patients for Virtual Visits (Telemedicine).  Patients are able to view lab/test results, encounter notes, upcoming appointments, etc.  Non-urgent messages can be sent to your provider as well.   To learn more about what you can do with MyChart, go to [email or web address].    Your next appointment:   6 month(s)  The format for your next appointment:   In Person  Provider:   Pixie Casino, MD {

## 2022-01-27 ENCOUNTER — Encounter (HOSPITAL_BASED_OUTPATIENT_CLINIC_OR_DEPARTMENT_OTHER): Payer: Self-pay | Admitting: Internal Medicine

## 2022-01-28 ENCOUNTER — Telehealth: Payer: Self-pay | Admitting: Internal Medicine

## 2022-01-28 NOTE — Telephone Encounter (Signed)
Pt c/o medication issue:  1. Name of Medication: atorvastatin (LIPITOR) 20 MG tablet  2. How are you currently taking this medication (dosage and times per day)? Take 1 tablet (20 mg total) by mouth at bedtime.  3. Are you having a reaction (difficulty breathing--STAT)? no  4. What is your medication issue? Patient calling to see if she needs to start back taking this medication. Please advise

## 2022-01-28 NOTE — Telephone Encounter (Signed)
Returned the call to the patient. She was calling to say that she felt better while being off of the atorvastatin. She was calling to see what the next step is for her-to try it again or try something else.

## 2022-01-30 NOTE — Telephone Encounter (Signed)
Would advise stopping lipitor (Atorvastatin) - if symptoms have resolved, would try rosuvastatin 5 mg daily if she is willing.  Dr Lemmie Evens

## 2022-01-31 ENCOUNTER — Other Ambulatory Visit (HOSPITAL_COMMUNITY): Payer: Self-pay

## 2022-01-31 MED ORDER — ROSUVASTATIN CALCIUM 5 MG PO TABS: 5.0000 mg | ORAL_TABLET | Freq: Every day | ORAL | 3 refills | Status: DC

## 2022-01-31 NOTE — Telephone Encounter (Signed)
I'd say restart the atorvastatin - sounds like he's working his muscles more on the weekends  [employer]

## 2022-01-31 NOTE — Telephone Encounter (Signed)
New prescription sent in.  Pixie Casino, MD  Ricci Barker, RN; Fidel Levy, RN  Called patient this am - I had sent a response with recommendation to switch to rosuvastatin 5 mg daily last night - stop atorvastatin. Advised she wait 1 more week before starting medicine. Will need a repeat lipid NMR in 3 months.   Thanks.   Dr. Lemmie Evens

## 2022-01-31 NOTE — Telephone Encounter (Signed)
Called the patient for the recommendations and she stated that she sent a MyChart message this morning and wants an answer today by 5pm concerning that message. She was advised that she could restart the Lipitor but wants Dr. Lysbeth Penner recommendations only.   An update-- My calves were really tight over the weekend.  I haven't been on lipitor for two weeks,  so it  seems that the meds are not the source of the pain.  Should I restart taking lipitor?   thanks, [employer]

## 2022-02-01 NOTE — Telephone Encounter (Signed)
MD spoke with patient and changed rx from atorvastatin >> rosuvastatin. See separate phone note

## 2022-02-03 DIAGNOSIS — H2513 Age-related nuclear cataract, bilateral: Secondary | ICD-10-CM | POA: Diagnosis not present

## 2022-02-03 DIAGNOSIS — H5203 Hypermetropia, bilateral: Secondary | ICD-10-CM | POA: Diagnosis not present

## 2022-02-08 ENCOUNTER — Other Ambulatory Visit (HOSPITAL_COMMUNITY): Payer: Self-pay

## 2022-02-15 ENCOUNTER — Encounter: Payer: Self-pay | Admitting: Internal Medicine

## 2022-02-17 DIAGNOSIS — Z681 Body mass index (BMI) 19 or less, adult: Secondary | ICD-10-CM | POA: Diagnosis not present

## 2022-02-17 DIAGNOSIS — Z23 Encounter for immunization: Secondary | ICD-10-CM | POA: Diagnosis not present

## 2022-02-17 DIAGNOSIS — M25551 Pain in right hip: Secondary | ICD-10-CM | POA: Diagnosis not present

## 2022-02-21 DIAGNOSIS — Z23 Encounter for immunization: Secondary | ICD-10-CM | POA: Diagnosis not present

## 2022-02-23 ENCOUNTER — Inpatient Hospital Stay (HOSPITAL_BASED_OUTPATIENT_CLINIC_OR_DEPARTMENT_OTHER): Payer: Medicare Other | Admitting: Internal Medicine

## 2022-02-23 ENCOUNTER — Inpatient Hospital Stay: Payer: Medicare Other | Attending: Internal Medicine

## 2022-02-23 ENCOUNTER — Other Ambulatory Visit: Payer: Self-pay

## 2022-02-23 VITALS — BP 107/63 | HR 81 | Temp 98.0°F | Resp 16 | Wt 90.6 lb

## 2022-02-23 DIAGNOSIS — R918 Other nonspecific abnormal finding of lung field: Secondary | ICD-10-CM | POA: Diagnosis not present

## 2022-02-23 DIAGNOSIS — Z79899 Other long term (current) drug therapy: Secondary | ICD-10-CM | POA: Insufficient documentation

## 2022-02-23 DIAGNOSIS — Z9221 Personal history of antineoplastic chemotherapy: Secondary | ICD-10-CM | POA: Insufficient documentation

## 2022-02-23 DIAGNOSIS — C3490 Malignant neoplasm of unspecified part of unspecified bronchus or lung: Secondary | ICD-10-CM

## 2022-02-23 DIAGNOSIS — C3411 Malignant neoplasm of upper lobe, right bronchus or lung: Secondary | ICD-10-CM | POA: Diagnosis not present

## 2022-02-23 DIAGNOSIS — Z902 Acquired absence of lung [part of]: Secondary | ICD-10-CM | POA: Insufficient documentation

## 2022-02-23 DIAGNOSIS — Z923 Personal history of irradiation: Secondary | ICD-10-CM | POA: Insufficient documentation

## 2022-02-23 LAB — CMP (CANCER CENTER ONLY)
ALT: 16 U/L (ref 0–44)
AST: 24 U/L (ref 15–41)
Albumin: 4.4 g/dL (ref 3.5–5.0)
Alkaline Phosphatase: 52 U/L (ref 38–126)
Anion gap: 3 — ABNORMAL LOW (ref 5–15)
BUN: 22 mg/dL (ref 8–23)
CO2: 33 mmol/L — ABNORMAL HIGH (ref 22–32)
Calcium: 9.7 mg/dL (ref 8.9–10.3)
Chloride: 102 mmol/L (ref 98–111)
Creatinine: 1.01 mg/dL — ABNORMAL HIGH (ref 0.44–1.00)
GFR, Estimated: 59 mL/min — ABNORMAL LOW (ref >60)
Glucose, Bld: 103 mg/dL — ABNORMAL HIGH (ref 70–99)
Potassium: 4.3 mmol/L (ref 3.5–5.1)
Sodium: 138 mmol/L (ref 135–145)
Total Bilirubin: 0.4 mg/dL (ref 0.3–1.2)
Total Protein: 6.4 g/dL — ABNORMAL LOW (ref 6.5–8.1)

## 2022-02-23 LAB — CBC WITH DIFFERENTIAL (CANCER CENTER ONLY)
Abs Immature Granulocytes: 0.01 10*3/uL (ref 0.00–0.07)
Basophils Absolute: 0 10*3/uL (ref 0.0–0.1)
Basophils Relative: 1 %
Eosinophils Absolute: 0.2 10*3/uL (ref 0.0–0.5)
Eosinophils Relative: 3 %
HCT: 39.8 % (ref 36.0–46.0)
Hemoglobin: 13.3 g/dL (ref 12.0–15.0)
Immature Granulocytes: 0 %
Lymphocytes Relative: 19 %
Lymphs Abs: 1 10*3/uL (ref 0.7–4.0)
MCH: 31 pg (ref 26.0–34.0)
MCHC: 33.4 g/dL (ref 30.0–36.0)
MCV: 92.8 fL (ref 80.0–100.0)
Monocytes Absolute: 0.8 10*3/uL (ref 0.1–1.0)
Monocytes Relative: 16 %
Neutro Abs: 3.2 10*3/uL (ref 1.7–7.7)
Neutrophils Relative %: 61 %
Platelet Count: 162 10*3/uL (ref 150–400)
RBC: 4.29 MIL/uL (ref 3.87–5.11)
RDW: 13.2 % (ref 11.5–15.5)
WBC Count: 5.3 10*3/uL (ref 4.0–10.5)
nRBC: 0 % (ref 0.0–0.2)

## 2022-02-23 NOTE — Progress Notes (Signed)
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  PRIOR THERAPY: 1) Right VATS with right upper lobectomy with bronchoplasty closure and en bloc resection of a wedge from the superior segment of the right lower lobe in addition to mediastinal lymph node dissection on 07/30/2015 under the care of Dr. Roxan Hockey. 2) Adjuvant systemic chemotherapy with cisplatin 75 MG/M2 and Alimta 500 MG/M2 every 3 weeks. First dose was given on 09/10/2015. Status post 4 cycles. 3) adjuvant radiotherapy under the care of Dr. Tammi Klippel.  CURRENT THERAPY: Tagrisso 80 mg p.o. daily started 12/01/2018.  Status post 38 months of treatment.  INTERVAL HISTORY: [name] West 71 y.o. female returns to the clinic today for follow-up visit.  The patient is feeling fine today with no concerning complaints.  She denied having any current chest pain, shortness of breath, cough or hemoptysis.  She has no nausea, vomiting, diarrhea or constipation.  She has no skin rash.  She denied having any weight loss or night sweats.  She has no fever or chills.  She continues to tolerate her treatment with Tagrisso fairly well.  She is here today for evaluation and repeat blood work.  MEDICAL HISTORY: Past Medical History:  Diagnosis Date   Anxiety    Arthritis    shoulders   Cancer ([place]) 06/2014   Lung Cancer   Complication of anesthesia    nausea, does well with PROPOFOL    Cough 09/17/2015   Encounter for antineoplastic chemotherapy 10/22/2015   Environmental and seasonal allergies    Family history of  adverse reaction to anesthesia    Patients grandmother died while having hand surgery; pt unsure of cause   Oral thrush 10/22/2015   Ovarian cyst    PONV (postoperative nausea and vomiting)    Raynaud disease     ALLERGIES:  is allergic to avelox [moxifloxacin hcl in nacl], cephalexin, cephalosporins, ciprofloxacin hcl, clindamycin/lincomycin, penicillins, shrimp [shellfish allergy], macrobid [nitrofurantoin monohyd macro], omnipaque [iohexol], azithromycin, fluconazole, adhesive [tape], lidoderm [lidocaine], pseudoephedrine, and sulfa antibiotics.  MEDICATIONS:  Current Outpatient Medications  Medication Sig Dispense Refill   cholecalciferol (VITAMIN D) 1000 UNITS tablet Take 1,000 Units by mouth daily.     citalopram (CELEXA) 10 MG tablet Take 10 mg by mouth daily.     clobetasol ointment (TEMOVATE) 2.25 % 1 application     Clobetasol Prop Oint-Coal Tar (CLOBETAPLUS OINTMENT EX) Apply 1 Dose topically daily.     EPINEPHrine 0.3 mg/0.3 mL IJ SOAJ injection Inject into the muscle as directed.     estradiol (ESTRACE) 0.1 MG/GM vaginal cream See admin instructions.     ibuprofen (ADVIL,MOTRIN) 200 MG tablet Take 200 mg by mouth every 6 (six) hours as needed (pain).     levocetirizine (XYZAL) 5 MG tablet 1 tablet in the evening     LORazepam (ATIVAN) 0.5 MG tablet Take 0.5 mg by mouth at bedtime as needed for anxiety or sleep.      Multiple Vitamin (MULTIVITAMIN WITH MINERALS) TABS Take 1 tablet by mouth daily.     NONFORMULARY OR COMPOUNDED ITEM Place 1 application vaginally 2 (two) times a week. Estradiol  0.02% Cream     osimertinib mesylate (TAGRISSO) 80 MG tablet TAKE 1 TABLET (80 MG TOTAL) BY MOUTH DAILY. 60 tablet 2   polyethylene glycol powder (GLYCOLAX/MIRALAX) 17 GM/SCOOP powder See admin instructions.     predniSONE (DELTASONE) 50 MG tablet TAKE 1 TABLET BY MOUTH 13 HOURS PRIOR TO CT, 1 TABLET 7 HOURS PRIOR & 1 TABLET 1 HOUR PRIOR TO CT 3 tablet 3   Probiotic Product (ALIGN) 4 MG  CAPS Take 1 capsule by mouth daily.     rosuvastatin (CRESTOR) 5 MG tablet Take 1 tablet (5 mg total) by mouth daily. 90 tablet 3   Wheat Dextrin (BENEFIBER) POWD See admin instructions.     No current facility-administered medications for this visit.    SURGICAL HISTORY:  Past Surgical History:  Procedure Laterality Date   BREAST EXCISIONAL BIOPSY Left    [place]   COLONOSCOPY WITH PROPOFOL N/A 09/27/2012   Procedure: COLONOSCOPY WITH PROPOFOL;  Surgeon: Garlan Fair, MD;  [facility];  Service: Endoscopy;  Laterality: N/A;   Fibroid Removed     LAPAROTOMY Left 07/20/2016   Procedure: MINI LAPAROTOMY, REMOVAL OF LEFT OVARIAN CYST, LEFT SALPINGO-OOPHORECTOMY;  Surgeon: Dian Queen, MD;  Location: [facility];  Service: Gynecology;  Laterality: Left;  please moved to 7:30am if spot opens   Right Rotator Cuff Right    TOTAL SHOULDER ARTHROPLASTY Left 06/14/2018   Procedure: TOTAL SHOULDER ARTHROPLASTY;  Surgeon: Justice Britain, MD;  [facility]: [facility];  Service: Orthopedics;  Laterality: Left;  114mn   VIDEO ASSISTED THORACOSCOPY (VATS)/ LOBECTOMY Right 07/30/2015   Procedure: RIGHT VIDEO ASSISTED THORACOSCOPY (VATS)/RIGHT UPPER LOBECTOMY;  Surgeon: SMelrose Nakayama MD;  [facility]  Service: Thoracic;  Laterality: Right;   VIDEO BRONCHOSCOPY WITH ENDOBRONCHIAL NAVIGATION N/A 07/15/2015   Procedure: VIDEO BRONCHOSCOPY WITH ENDOBRONCHIAL NAVIGATION with Biopsy;  Surgeon: RCollene Gobble MD;  [facility];  Service: Thoracic;  Laterality: N/A;    REVIEW OF SYSTEMS:  A comprehensive review of systems was negative.   PHYSICAL EXAMINATION: General appearance: alert, cooperative, and no distress Head: Normocephalic, without obvious abnormality, atraumatic Neck: no adenopathy, no JVD, supple, symmetrical, trachea midline, and thyroid not enlarged, symmetric, no tenderness/mass/nodules Lymph nodes: Cervical, supraclavicular, and axillary nodes  normal. Resp: clear to auscultation bilaterally Back: symmetric, no curvature. ROM normal. No CVA tenderness. Cardio: regular rate and rhythm, S1, S2 normal, no murmur, click, rub or gallop GI: soft, non-tender; bowel sounds normal; no masses,  no organomegaly Extremities: extremities normal, atraumatic, no cyanosis or edema  ECOG PERFORMANCE STATUS: 0 - Asymptomatic  Blood pressure 107/63, pulse 81, temperature 98 F (36.7 C), resp. rate 16, weight 90 lb 9 oz (41.1 kg), SpO2 100 %.   LABORATORY DATA: Lab Results  Component Value Date   WBC 10.7 (H) 12/22/2021   HGB 13.9 12/22/2021   HCT 41.1 12/22/2021   MCV 91.1 12/22/2021   PLT 253 12/22/2021      Chemistry      Component Value Date/Time   NA 135 12/22/2021 0955   NA 139 06/16/2017 1132   K 4.0 12/22/2021 0955   K 4.7 06/16/2017 1132   CL 99 12/22/2021 0955   CO2 27 12/22/2021 0955   CO2 28 06/16/2017 1132   BUN 29 (H) 12/22/2021 0955   BUN 19.5 06/16/2017 1132   CREATININE 0.95 12/22/2021 0955   CREATININE 0.8 06/16/2017 1132      Component Value Date/Time   CALCIUM  9.7 12/22/2021 0955   CALCIUM 10.0 06/16/2017 1132   ALKPHOS 57 12/22/2021 0955   ALKPHOS 68 06/16/2017 1132   AST 25 12/22/2021 0955   AST 21 06/16/2017 1132   ALT 22 12/22/2021 0955   ALT 18 06/16/2017 1132   BILITOT 0.4 12/22/2021 0955   BILITOT 0.50 06/16/2017 1132       RADIOGRAPHIC STUDIES: No results found.  ASSESSMENT AND PLAN:  This is a very pleasant 71 years old white female with recurrent non-small cell lung cancer initially diagnosed as stage IIIA non-small cell lung cancer, adenocarcinoma with positive EGFR mutation in exon 19 status post right upper lobectomy with lymph node dissection followed by adjuvant systemic chemotherapy as well as adjuvant radiation. The patient has been on observation for almost 3 years.   Unfortunately her scan in June 2020 showed multiple tiny pulmonary nodules scattered throughout both lungs which  many of them have increased in size and some new nodule suspected the previous scan.  The patient started treatment with Tagrisso 80 mg p.o. daily status post 38 months of treatment.   The patient has been tolerating her treatment with Tagrisso fairly well with no concerning adverse effects. Repeat blood work today is unremarkable for any concerning abnormalities. I recommended for her to continue her current treatment with Tagrisso with the same dose. I will see her back for follow-up visit in 3 months for evaluation with repeat CT scan of the chest for restaging of her disease. The patient was advised to call immediately if she has any concerning symptoms in the interval. The patient voices understanding of current disease status and treatment options and is in agreement with the current care plan. All questions were answered. The patient knows to call the clinic with any problems, questions or concerns. We can certainly see the patient much sooner if necessary.  Disclaimer: This note was dictated with voice recognition software. Similar sounding words can inadvertently be transcribed and may not be corrected upon review.

## 2022-02-24 ENCOUNTER — Other Ambulatory Visit (HOSPITAL_COMMUNITY): Payer: Self-pay

## 2022-02-25 ENCOUNTER — Other Ambulatory Visit (HOSPITAL_COMMUNITY): Payer: Self-pay

## 2022-02-28 DIAGNOSIS — M25551 Pain in right hip: Secondary | ICD-10-CM | POA: Diagnosis not present

## 2022-03-08 ENCOUNTER — Other Ambulatory Visit (HOSPITAL_COMMUNITY): Payer: Self-pay

## 2022-03-09 DIAGNOSIS — M25551 Pain in right hip: Secondary | ICD-10-CM | POA: Diagnosis not present

## 2022-03-11 ENCOUNTER — Ambulatory Visit
Admission: RE | Admit: 2022-03-11 | Discharge: 2022-03-11 | Disposition: A | Discharge status: Home / Self Care | Payer: Medicare Other | Source: Ambulatory Visit | Attending: Family Medicine | Admitting: Family Medicine

## 2022-03-11 DIAGNOSIS — Z1231 Encounter for screening mammogram for malignant neoplasm of breast: Secondary | ICD-10-CM | POA: Diagnosis not present

## 2022-03-14 DIAGNOSIS — Z23 Encounter for immunization: Secondary | ICD-10-CM | POA: Diagnosis not present

## 2022-03-16 DIAGNOSIS — M25551 Pain in right hip: Secondary | ICD-10-CM | POA: Diagnosis not present

## 2022-03-21 DIAGNOSIS — H04123 Dry eye syndrome of bilateral lacrimal glands: Secondary | ICD-10-CM | POA: Diagnosis not present

## 2022-03-21 DIAGNOSIS — H2513 Age-related nuclear cataract, bilateral: Secondary | ICD-10-CM | POA: Diagnosis not present

## 2022-03-21 DIAGNOSIS — H1820 Unspecified corneal edema: Secondary | ICD-10-CM | POA: Diagnosis not present

## 2022-03-23 DIAGNOSIS — M25551 Pain in right hip: Secondary | ICD-10-CM | POA: Diagnosis not present

## 2022-03-28 ENCOUNTER — Other Ambulatory Visit (HOSPITAL_COMMUNITY): Payer: Self-pay

## 2022-03-30 DIAGNOSIS — M25551 Pain in right hip: Secondary | ICD-10-CM | POA: Diagnosis not present

## 2022-03-31 ENCOUNTER — Encounter: Payer: Self-pay | Admitting: Internal Medicine

## 2022-04-01 ENCOUNTER — Other Ambulatory Visit: Payer: Self-pay

## 2022-04-05 ENCOUNTER — Other Ambulatory Visit (HOSPITAL_COMMUNITY): Payer: Self-pay

## 2022-04-06 DIAGNOSIS — M25551 Pain in right hip: Secondary | ICD-10-CM | POA: Diagnosis not present

## 2022-04-07 ENCOUNTER — Other Ambulatory Visit (HOSPITAL_COMMUNITY): Payer: Self-pay

## 2022-04-27 ENCOUNTER — Other Ambulatory Visit (HOSPITAL_COMMUNITY): Payer: Self-pay

## 2022-04-27 ENCOUNTER — Other Ambulatory Visit: Payer: Self-pay | Admitting: Internal Medicine

## 2022-04-27 DIAGNOSIS — C3491 Malignant neoplasm of unspecified part of right bronchus or lung: Secondary | ICD-10-CM

## 2022-04-27 MED ORDER — OSIMERTINIB MESYLATE 80 MG PO TABS
ORAL_TABLET | Freq: Every day | ORAL | 2 refills | Status: DC
  Filled 2022-04-28: qty 30, 30d supply, fill #0
  Filled 2022-05-26: qty 30, 30d supply, fill #1
  Filled 2022-06-30: qty 30, 30d supply, fill #2
  Filled 2022-08-02: qty 30, 30d supply, fill #3
  Filled 2022-08-24: qty 30, 30d supply, fill #4
  Filled 2022-09-30: qty 30, 30d supply, fill #5

## 2022-04-28 ENCOUNTER — Other Ambulatory Visit (HOSPITAL_COMMUNITY): Payer: Self-pay

## 2022-05-02 ENCOUNTER — Telehealth: Payer: Self-pay | Admitting: Internal Medicine

## 2022-05-02 ENCOUNTER — Other Ambulatory Visit (HOSPITAL_COMMUNITY): Payer: Self-pay

## 2022-05-02 DIAGNOSIS — M25551 Pain in right hip: Secondary | ICD-10-CM | POA: Diagnosis not present

## 2022-05-02 NOTE — Telephone Encounter (Signed)
Called patient regarding upcoming December appointments, patient has been called and voicemail was left.

## 2022-05-04 ENCOUNTER — Other Ambulatory Visit (HOSPITAL_COMMUNITY): Payer: Self-pay

## 2022-05-11 DIAGNOSIS — M25551 Pain in right hip: Secondary | ICD-10-CM | POA: Diagnosis not present

## 2022-05-17 DIAGNOSIS — M25551 Pain in right hip: Secondary | ICD-10-CM | POA: Diagnosis not present

## 2022-05-23 ENCOUNTER — Inpatient Hospital Stay: Payer: Medicare Other | Attending: Internal Medicine

## 2022-05-23 ENCOUNTER — Ambulatory Visit (HOSPITAL_COMMUNITY)
Admission: RE | Admit: 2022-05-23 | Discharge: 2022-05-23 | Disposition: A | Discharge status: Home / Self Care | Payer: Medicare Other | Source: Ambulatory Visit | Attending: Internal Medicine | Admitting: Internal Medicine

## 2022-05-23 DIAGNOSIS — C3490 Malignant neoplasm of unspecified part of unspecified bronchus or lung: Secondary | ICD-10-CM | POA: Insufficient documentation

## 2022-05-23 DIAGNOSIS — R918 Other nonspecific abnormal finding of lung field: Secondary | ICD-10-CM | POA: Diagnosis not present

## 2022-05-23 DIAGNOSIS — Z79899 Other long term (current) drug therapy: Secondary | ICD-10-CM | POA: Insufficient documentation

## 2022-05-23 DIAGNOSIS — Z902 Acquired absence of lung [part of]: Secondary | ICD-10-CM | POA: Insufficient documentation

## 2022-05-23 DIAGNOSIS — Z9221 Personal history of antineoplastic chemotherapy: Secondary | ICD-10-CM | POA: Insufficient documentation

## 2022-05-23 DIAGNOSIS — C3411 Malignant neoplasm of upper lobe, right bronchus or lung: Secondary | ICD-10-CM | POA: Insufficient documentation

## 2022-05-23 DIAGNOSIS — Z923 Personal history of irradiation: Secondary | ICD-10-CM | POA: Insufficient documentation

## 2022-05-23 DIAGNOSIS — J439 Emphysema, unspecified: Secondary | ICD-10-CM | POA: Diagnosis not present

## 2022-05-23 LAB — CBC WITH DIFFERENTIAL (CANCER CENTER ONLY)
Abs Immature Granulocytes: 0.01 10*3/uL (ref 0.00–0.07)
Basophils Absolute: 0 10*3/uL (ref 0.0–0.1)
Basophils Relative: 0 %
Eosinophils Absolute: 0 10*3/uL (ref 0.0–0.5)
Eosinophils Relative: 0 %
HCT: 41 % (ref 36.0–46.0)
Hemoglobin: 13.9 g/dL (ref 12.0–15.0)
Immature Granulocytes: 0 %
Lymphocytes Relative: 9 %
Lymphs Abs: 0.6 10*3/uL — ABNORMAL LOW (ref 0.7–4.0)
MCH: 30.8 pg (ref 26.0–34.0)
MCHC: 33.9 g/dL (ref 30.0–36.0)
MCV: 90.9 fL (ref 80.0–100.0)
Monocytes Absolute: 0.1 10*3/uL (ref 0.1–1.0)
Monocytes Relative: 2 %
Neutro Abs: 5.3 10*3/uL (ref 1.7–7.7)
Neutrophils Relative %: 89 %
Platelet Count: 196 10*3/uL (ref 150–400)
RBC: 4.51 MIL/uL (ref 3.87–5.11)
RDW: 13.1 % (ref 11.5–15.5)
WBC Count: 6 10*3/uL (ref 4.0–10.5)
nRBC: 0 % (ref 0.0–0.2)

## 2022-05-23 LAB — CMP (CANCER CENTER ONLY)
ALT: 18 U/L (ref 0–44)
AST: 24 U/L (ref 15–41)
Albumin: 4.6 g/dL (ref 3.5–5.0)
Alkaline Phosphatase: 56 U/L (ref 38–126)
Anion gap: 7 (ref 5–15)
BUN: 22 mg/dL (ref 8–23)
CO2: 28 mmol/L (ref 22–32)
Calcium: 9.9 mg/dL (ref 8.9–10.3)
Chloride: 100 mmol/L (ref 98–111)
Creatinine: 0.95 mg/dL (ref 0.44–1.00)
GFR, Estimated: >60 mL/min (ref >60)
Glucose, Bld: 185 mg/dL — ABNORMAL HIGH (ref 70–99)
Potassium: 4.3 mmol/L (ref 3.5–5.1)
Sodium: 135 mmol/L (ref 135–145)
Total Bilirubin: 0.4 mg/dL (ref 0.3–1.2)
Total Protein: 6.9 g/dL (ref 6.5–8.1)

## 2022-05-23 MED ORDER — IOHEXOL 300 MG/ML  SOLN
100.0000 mL | Freq: Once | INTRAMUSCULAR | Status: AC | PRN
  Administered 2022-05-23: 100 mL via INTRAVENOUS

## 2022-05-24 ENCOUNTER — Inpatient Hospital Stay (HOSPITAL_BASED_OUTPATIENT_CLINIC_OR_DEPARTMENT_OTHER): Payer: Medicare Other | Admitting: Internal Medicine

## 2022-05-24 VITALS — BP 123/74 | HR 80 | Temp 98.2°F | Resp 16 | Wt 92.4 lb

## 2022-05-24 DIAGNOSIS — Z9221 Personal history of antineoplastic chemotherapy: Secondary | ICD-10-CM | POA: Diagnosis not present

## 2022-05-24 DIAGNOSIS — Z923 Personal history of irradiation: Secondary | ICD-10-CM | POA: Diagnosis not present

## 2022-05-24 DIAGNOSIS — C3491 Malignant neoplasm of unspecified part of right bronchus or lung: Secondary | ICD-10-CM | POA: Diagnosis not present

## 2022-05-24 DIAGNOSIS — Z902 Acquired absence of lung [part of]: Secondary | ICD-10-CM | POA: Diagnosis not present

## 2022-05-24 DIAGNOSIS — Z79899 Other long term (current) drug therapy: Secondary | ICD-10-CM | POA: Diagnosis not present

## 2022-05-24 DIAGNOSIS — C3411 Malignant neoplasm of upper lobe, right bronchus or lung: Secondary | ICD-10-CM | POA: Diagnosis not present

## 2022-05-24 NOTE — Progress Notes (Signed)
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  PRIOR THERAPY: 1) Right VATS with right upper lobectomy with bronchoplasty closure and en bloc resection of a wedge from the superior segment of the right lower lobe in addition to mediastinal lymph node dissection on 07/30/2015 under the care of Dr. Roxan Hockey. 2) Adjuvant systemic chemotherapy with cisplatin 75 MG/M2 and Alimta 500 MG/M2 every 3 weeks. First dose was given on 09/10/2015. Status post 4 cycles. 3) adjuvant radiotherapy under the care of Dr. Tammi Klippel.  CURRENT THERAPY: Tagrisso 80 mg p.o. daily started 12/01/2018.  Status post 38 months of treatment.  INTERVAL HISTORY: [name] West 71 y.o. female returns to the clinic today for follow-up visit accompanied by her husband.  The patient is feeling fine today with no concerning complaints.  She denied having any current chest pain, shortness of breath, cough or hemoptysis.  She has no nausea, vomiting, diarrhea or constipation.  She has no headache or visual changes.  She has no recent weight loss or night sweats.  She has been tolerating her treatment with Tagrisso fairly well.  She is here today for evaluation with repeat CT scan of the chest, abdomen and pelvis for restaging of her disease.  MEDICAL HISTORY: Past Medical History:  Diagnosis Date   Anxiety    Arthritis    shoulders   Cancer ([place]) 06/2014   Lung Cancer   Complication of anesthesia    nausea, does well with PROPOFOL    Cough 09/17/2015   Encounter for antineoplastic chemotherapy  10/22/2015   Environmental and seasonal allergies    Family history of adverse reaction to anesthesia    Patients grandmother died while having hand surgery; pt unsure of cause   Oral thrush 10/22/2015   Ovarian cyst    PONV (postoperative nausea and vomiting)    Raynaud disease     ALLERGIES:  is allergic to avelox [moxifloxacin hcl in nacl], cephalexin, cephalosporins, ciprofloxacin hcl, clindamycin/lincomycin, penicillins, shrimp [shellfish allergy], macrobid [nitrofurantoin monohyd macro], omnipaque [iohexol], azithromycin, fluconazole, adhesive [tape], lidoderm [lidocaine], pseudoephedrine, and sulfa antibiotics.  MEDICATIONS:  Current Outpatient Medications  Medication Sig Dispense Refill   cholecalciferol (VITAMIN D) 1000 UNITS tablet Take 1,000 Units by mouth daily.     citalopram (CELEXA) 10 MG tablet Take 10 mg by mouth daily.     clobetasol ointment (TEMOVATE) 9.74 % 1 application     Clobetasol Prop Oint-Coal Tar (CLOBETAPLUS OINTMENT EX) Apply 1 Dose topically daily.     EPINEPHrine 0.3 mg/0.3 mL IJ SOAJ injection Inject into the muscle as directed.     estradiol (ESTRACE) 0.1 MG/GM vaginal cream See admin instructions.     ibuprofen (ADVIL,MOTRIN) 200 MG tablet Take 200 mg by mouth every 6 (six) hours as needed (pain).     levocetirizine (XYZAL) 5 MG tablet 1 tablet in the evening     LORazepam (ATIVAN) 0.5 MG tablet Take 0.5 mg by mouth at bedtime as needed for anxiety or sleep.      Multiple Vitamin (MULTIVITAMIN WITH MINERALS) TABS Take 1 tablet by mouth daily.     NONFORMULARY OR COMPOUNDED ITEM  Place 1 application vaginally 2 (two) times a week. Estradiol 0.02% Cream     osimertinib mesylate (TAGRISSO) 80 MG tablet TAKE 1 TABLET (80 MG TOTAL) BY MOUTH DAILY. 60 tablet 2   polyethylene glycol powder (GLYCOLAX/MIRALAX) 17 GM/SCOOP powder See admin instructions.     predniSONE (DELTASONE) 50 MG tablet TAKE 1 TABLET BY MOUTH 13 HOURS PRIOR TO CT, 1 TABLET 7 HOURS PRIOR & 1  TABLET 1 HOUR PRIOR TO CT 3 tablet 3   Probiotic Product (ALIGN) 4 MG CAPS Take 1 capsule by mouth daily.     rosuvastatin (CRESTOR) 5 MG tablet Take 1 tablet (5 mg total) by mouth daily. 90 tablet 3   Wheat Dextrin (BENEFIBER) POWD See admin instructions.     No current facility-administered medications for this visit.    SURGICAL HISTORY:  Past Surgical History:  Procedure Laterality Date   BREAST EXCISIONAL BIOPSY Left    [place]   COLONOSCOPY WITH PROPOFOL N/A 09/27/2012   Procedure: COLONOSCOPY WITH PROPOFOL;  Surgeon: Garlan Fair, MD;  Location: [facility];  Service: Endoscopy;  Laterality: N/A;   Fibroid Removed     LAPAROTOMY Left 07/20/2016   Procedure: MINI LAPAROTOMY, REMOVAL OF LEFT OVARIAN CYST, LEFT SALPINGO-OOPHORECTOMY;  Surgeon: Dian Queen, MD;  Location: [facility];  Service: Gynecology;  Laterality: Left;  please moved to 7:30am if spot opens   Right Rotator Cuff Right    TOTAL SHOULDER ARTHROPLASTY Left 06/14/2018   Procedure: TOTAL SHOULDER ARTHROPLASTY;  Surgeon: Justice Britain, MD;  Location: [facility];  Service: Orthopedics;  Laterality: Left;  188mn   VIDEO ASSISTED THORACOSCOPY (VATS)/ LOBECTOMY Right 07/30/2015   Procedure: RIGHT VIDEO ASSISTED THORACOSCOPY (VATS)/RIGHT UPPER LOBECTOMY;  Surgeon: SMelrose Nakayama MD;  [facility]  Service: Thoracic;  Laterality: Right;   VIDEO BRONCHOSCOPY WITH ENDOBRONCHIAL NAVIGATION N/A 07/15/2015   Procedure: VIDEO BRONCHOSCOPY WITH ENDOBRONCHIAL NAVIGATION with Biopsy;  Surgeon: RCollene Gobble MD;  [facility]  Service: Thoracic;  Laterality: N/A;    REVIEW OF SYSTEMS:  Constitutional: negative Eyes: negative Ears, nose, mouth, throat, and face: negative Respiratory: negative Cardiovascular: negative Gastrointestinal: negative Genitourinary:negative Integument/breast: negative Hematologic/lymphatic: negative Musculoskeletal:negative Neurological: negative Behavioral/Psych:  negative Endocrine: negative Allergic/Immunologic: negative   PHYSICAL EXAMINATION: General appearance: alert, cooperative, and no distress Head: Normocephalic, without obvious abnormality, atraumatic Neck: no adenopathy, no JVD, supple, symmetrical, trachea midline, and thyroid not enlarged, symmetric, no tenderness/mass/nodules Lymph nodes: Cervical, supraclavicular, and axillary nodes normal. Resp: clear to auscultation bilaterally Back: symmetric, no curvature. ROM normal. No CVA tenderness. Cardio: regular rate and rhythm, S1, S2 normal, no murmur, click, rub or gallop GI: soft, non-tender; bowel sounds normal; no masses,  no organomegaly Extremities: extremities normal, atraumatic, no cyanosis or edema Neurologic: Alert and oriented X 3, normal strength and tone. Normal symmetric reflexes. Normal coordination and gait  ECOG PERFORMANCE STATUS: 0 - Asymptomatic  Blood pressure 107/63, pulse 81, temperature 98 F (36.7 C), resp. rate 16, weight 90 lb 9 oz (41.1 kg), SpO2 100 %.   LABORATORY DATA: Lab Results  Component Value Date   WBC 10.7 (H) 12/22/2021   HGB 13.9 12/22/2021   HCT 41.1 12/22/2021   MCV 91.1 12/22/2021   PLT 253 12/22/2021      Chemistry      Component Value Date/Time   NA 135 12/22/2021 0955   NA 139 06/16/2017 1132   K 4.0 12/22/2021 0955   K 4.7 06/16/2017 1132   CL  99 12/22/2021 0955   CO2 27 12/22/2021 0955   CO2 28 06/16/2017 1132   BUN 29 (H) 12/22/2021 0955   BUN 19.5 06/16/2017 1132   CREATININE 0.95 12/22/2021 0955   CREATININE 0.8 06/16/2017 1132      Component Value Date/Time   CALCIUM 9.7 12/22/2021 0955   CALCIUM 10.0 06/16/2017 1132   ALKPHOS 57 12/22/2021 0955   ALKPHOS 68 06/16/2017 1132   AST 25 12/22/2021 0955   AST 21 06/16/2017 1132   ALT 22 12/22/2021 0955   ALT 18 06/16/2017 1132   BILITOT 0.4 12/22/2021 0955   BILITOT 0.50 06/16/2017 1132       RADIOGRAPHIC STUDIES: CT Chest W Contrast  Result Date:  05/23/2022 CLINICAL DATA:  Non-small-cell lung cancer, restaging. Prior chemotherapy and radiation therapy. Prior right upper lobectomy. * Tracking Code: BO * EXAM: CT CHEST, ABDOMEN, AND PELVIS WITH CONTRAST TECHNIQUE: Multidetector CT imaging of the chest, abdomen and pelvis was performed following the standard protocol during bolus administration of intravenous contrast. RADIATION DOSE REDUCTION: This exam was performed according to the departmental dose-optimization program which includes automated exposure control, adjustment of the mA and/or kV according to patient size and/or use of iterative reconstruction technique. CONTRAST:  160m OMNIPAQUE IOHEXOL 300 MG/ML  SOLN COMPARISON:  12/22/2021 FINDINGS: CT CHEST FINDINGS Cardiovascular: Beam hardening artifact from left shoulder arthroplasty. Aortic atherosclerosis. Normal heart size, without pericardial effusion. Lad coronary artery calcification. No central pulmonary embolism, on this non-dedicated study. Mediastinum/Nodes: No mediastinal or hilar adenopathy. Lungs/Pleura: No pleural fluid. Status post right upper lobectomy. Moderate centrilobular emphysema. Right apical pleuroparenchymal scarring. Pleural-based right lower lobe 5 mm pulmonary nodule is unchanged on 77/507. More cephalad pleural-based right lower lobe 4 mm nodule is similar on 63/507. 2-3 mm left lower lobe pulmonary nodule is similar on 78/507. Musculoskeletal: No acute osseous abnormality. CT ABDOMEN PELVIS FINDINGS Hepatobiliary: Normal liver. Normal gallbladder, without biliary ductal dilatation. Pancreas: Normal, without mass or ductal dilatation. Spleen: Normal in size, without focal abnormality. Adrenals/Urinary Tract: Normal adrenal glands. Bilateral extrarenal pelvis without renal abnormality or hydronephrosis. Normal urinary bladder. Stomach/Bowel: Normal stomach, without wall thickening. Colonic stool burden suggests constipation. Normal small bowel. Vascular/Lymphatic: Aortic  atherosclerosis. No abdominopelvic adenopathy. Reproductive: Normal uterus and adnexa. Other: No significant free fluid. Pelvic floor laxity. No free intraperitoneal air. No evidence of omental or peritoneal disease. Musculoskeletal: No acute osseous abnormality. IMPRESSION: 1. Status post right upper lobectomy, without findings of recurrent or metastatic disease. 2. Stable bilateral pulmonary nodules, favoring a benign etiology. 3. Coronary artery atherosclerosis. Aortic Atherosclerosis (ICD10-I70.0). 4.  Possible constipation. Electronically Signed   By: KAbigail MiyamotoM.D.   On: 05/23/2022 15:12   CT Abdomen Pelvis W Contrast  Result Date: 05/23/2022 CLINICAL DATA:  Non-small-cell lung cancer, restaging. Prior chemotherapy and radiation therapy. Prior right upper lobectomy. * Tracking Code: BO * EXAM: CT CHEST, ABDOMEN, AND PELVIS WITH CONTRAST TECHNIQUE: Multidetector CT imaging of the chest, abdomen and pelvis was performed following the standard protocol during bolus administration of intravenous contrast. RADIATION DOSE REDUCTION: This exam was performed according to the departmental dose-optimization program which includes automated exposure control, adjustment of the mA and/or kV according to patient size and/or use of iterative reconstruction technique. CONTRAST:  1064mOMNIPAQUE IOHEXOL 300 MG/ML  SOLN COMPARISON:  12/22/2021 FINDINGS: CT CHEST FINDINGS Cardiovascular: Beam hardening artifact from left shoulder arthroplasty. Aortic atherosclerosis. Normal heart size, without pericardial effusion. Lad coronary artery calcification. No central pulmonary embolism, on this non-dedicated study. Mediastinum/Nodes: No  mediastinal or hilar adenopathy. Lungs/Pleura: No pleural fluid. Status post right upper lobectomy. Moderate centrilobular emphysema. Right apical pleuroparenchymal scarring. Pleural-based right lower lobe 5 mm pulmonary nodule is unchanged on 77/507. More cephalad pleural-based right lower lobe  4 mm nodule is similar on 63/507. 2-3 mm left lower lobe pulmonary nodule is similar on 78/507. Musculoskeletal: No acute osseous abnormality. CT ABDOMEN PELVIS FINDINGS Hepatobiliary: Normal liver. Normal gallbladder, without biliary ductal dilatation. Pancreas: Normal, without mass or ductal dilatation. Spleen: Normal in size, without focal abnormality. Adrenals/Urinary Tract: Normal adrenal glands. Bilateral extrarenal pelvis without renal abnormality or hydronephrosis. Normal urinary bladder. Stomach/Bowel: Normal stomach, without wall thickening. Colonic stool burden suggests constipation. Normal small bowel. Vascular/Lymphatic: Aortic atherosclerosis. No abdominopelvic adenopathy. Reproductive: Normal uterus and adnexa. Other: No significant free fluid. Pelvic floor laxity. No free intraperitoneal air. No evidence of omental or peritoneal disease. Musculoskeletal: No acute osseous abnormality. IMPRESSION: 1. Status post right upper lobectomy, without findings of recurrent or metastatic disease. 2. Stable bilateral pulmonary nodules, favoring a benign etiology. 3. Coronary artery atherosclerosis. Aortic Atherosclerosis (ICD10-I70.0). 4.  Possible constipation. Electronically Signed   By: Abigail Miyamoto M.D.   On: 05/23/2022 15:12    ASSESSMENT AND PLAN:  This is a very pleasant 71 years old white female with recurrent non-small cell lung cancer initially diagnosed as stage IIIA non-small cell lung cancer, adenocarcinoma with positive EGFR mutation in exon 19 status post right upper lobectomy with lymph node dissection followed by adjuvant systemic chemotherapy as well as adjuvant radiation. The patient has been on observation for almost 3 years.   Unfortunately her scan in June 2020 showed multiple tiny pulmonary nodules scattered throughout both lungs which many of them have increased in size and some new nodule suspected the previous scan.  The patient started treatment with Tagrisso 80 mg p.o. daily  status post 40 months of treatment.   The patient has been tolerating her treatment with Tagrisso fairly well with no concerning adverse effects. She had repeat CT scan of the chest, abdomen and pelvis performed recently.  I personally and independently reviewed the scans and discussed the result with the patient today. Her scan showed no concerning findings for disease progression. I recommended for her to continue her current treatment with Tagrisso with the same dose. I will see her back for follow-up visit in 3 months for evaluation and repeat blood work. The patient was advised to call immediately if she has any other concerning symptoms in the interval.  The patient voices understanding of current disease status and treatment options and is in agreement with the current care plan. All questions were answered. The patient knows to call the clinic with any problems, questions or concerns. We can certainly see the patient much sooner if necessary.  Disclaimer: This note was dictated with voice recognition software. Similar sounding words can inadvertently be transcribed and may not be corrected upon review.

## 2022-05-25 ENCOUNTER — Telehealth: Payer: Self-pay | Admitting: Internal Medicine

## 2022-05-25 ENCOUNTER — Other Ambulatory Visit: Payer: Medicare Other

## 2022-05-25 DIAGNOSIS — M810 Age-related osteoporosis without current pathological fracture: Secondary | ICD-10-CM | POA: Diagnosis not present

## 2022-05-25 NOTE — Telephone Encounter (Signed)
Patient called to schedule f/u per los 12/12. Patient scheduled and notified.

## 2022-05-26 ENCOUNTER — Other Ambulatory Visit (HOSPITAL_COMMUNITY): Payer: Self-pay

## 2022-05-27 ENCOUNTER — Other Ambulatory Visit: Payer: Self-pay

## 2022-05-30 ENCOUNTER — Ambulatory Visit: Payer: Medicare Other | Admitting: Internal Medicine

## 2022-05-30 ENCOUNTER — Other Ambulatory Visit: Payer: Self-pay

## 2022-05-31 DIAGNOSIS — Z1211 Encounter for screening for malignant neoplasm of colon: Secondary | ICD-10-CM | POA: Diagnosis not present

## 2022-05-31 DIAGNOSIS — Z1389 Encounter for screening for other disorder: Secondary | ICD-10-CM | POA: Diagnosis not present

## 2022-05-31 DIAGNOSIS — Z681 Body mass index (BMI) 19 or less, adult: Secondary | ICD-10-CM | POA: Diagnosis not present

## 2022-05-31 DIAGNOSIS — Z0000 Encounter for general adult medical examination without abnormal findings: Secondary | ICD-10-CM | POA: Diagnosis not present

## 2022-05-31 DIAGNOSIS — M25551 Pain in right hip: Secondary | ICD-10-CM | POA: Diagnosis not present

## 2022-06-01 ENCOUNTER — Encounter: Payer: Self-pay | Admitting: Internal Medicine

## 2022-06-01 DIAGNOSIS — Z1211 Encounter for screening for malignant neoplasm of colon: Secondary | ICD-10-CM | POA: Diagnosis not present

## 2022-06-02 DIAGNOSIS — E785 Hyperlipidemia, unspecified: Secondary | ICD-10-CM | POA: Diagnosis not present

## 2022-06-02 DIAGNOSIS — Z91038 Other insect allergy status: Secondary | ICD-10-CM | POA: Diagnosis not present

## 2022-06-02 DIAGNOSIS — M810 Age-related osteoporosis without current pathological fracture: Secondary | ICD-10-CM | POA: Diagnosis not present

## 2022-06-02 DIAGNOSIS — F411 Generalized anxiety disorder: Secondary | ICD-10-CM | POA: Diagnosis not present

## 2022-06-02 DIAGNOSIS — C3491 Malignant neoplasm of unspecified part of right bronchus or lung: Secondary | ICD-10-CM | POA: Diagnosis not present

## 2022-06-02 DIAGNOSIS — Z681 Body mass index (BMI) 19 or less, adult: Secondary | ICD-10-CM | POA: Diagnosis not present

## 2022-06-02 DIAGNOSIS — Z23 Encounter for immunization: Secondary | ICD-10-CM | POA: Diagnosis not present

## 2022-06-02 DIAGNOSIS — L309 Dermatitis, unspecified: Secondary | ICD-10-CM | POA: Diagnosis not present

## 2022-06-02 DIAGNOSIS — K5903 Drug induced constipation: Secondary | ICD-10-CM | POA: Diagnosis not present

## 2022-06-02 DIAGNOSIS — J310 Chronic rhinitis: Secondary | ICD-10-CM | POA: Diagnosis not present

## 2022-06-02 DIAGNOSIS — I2510 Atherosclerotic heart disease of native coronary artery without angina pectoris: Secondary | ICD-10-CM | POA: Diagnosis not present

## 2022-06-02 DIAGNOSIS — M818 Other osteoporosis without current pathological fracture: Secondary | ICD-10-CM | POA: Diagnosis not present

## 2022-06-08 DIAGNOSIS — M25551 Pain in right hip: Secondary | ICD-10-CM | POA: Diagnosis not present

## 2022-06-30 ENCOUNTER — Telehealth: Payer: Self-pay | Admitting: Pharmacy Technician

## 2022-06-30 ENCOUNTER — Other Ambulatory Visit (HOSPITAL_COMMUNITY): Payer: Self-pay

## 2022-06-30 NOTE — Telephone Encounter (Signed)
Oral Oncology [employer] Encounter   Was successful in securing patient a $5,000 grant from [employer] ([employer]) to provide copayment coverage for Tagrisso.  This will keep the out of pocket expense at $0.     I have spoken with the patient.    The billing information is as follows and has been shared with [employer].   RxBin: [id number] PCN:  [id number] Member ID: [id number] Group ID: [id number] Dates of Eligibility: 06/30/22 through 06/30/23  Jinger Neighbors, CPhT-[facility] Patient [facility] Direct Number: [phone]  Fax: [phone]

## 2022-07-01 ENCOUNTER — Other Ambulatory Visit (HOSPITAL_COMMUNITY): Payer: Self-pay

## 2022-07-05 DIAGNOSIS — T886XXD Anaphylactic reaction due to adverse effect of correct drug or medicament properly administered, subsequent encounter: Secondary | ICD-10-CM | POA: Diagnosis not present

## 2022-07-05 DIAGNOSIS — J3089 Other allergic rhinitis: Secondary | ICD-10-CM | POA: Diagnosis not present

## 2022-07-06 ENCOUNTER — Encounter: Payer: Self-pay | Admitting: Internal Medicine

## 2022-07-15 DIAGNOSIS — R600 Localized edema: Secondary | ICD-10-CM | POA: Diagnosis not present

## 2022-07-15 DIAGNOSIS — M94251 Chondromalacia, right hip: Secondary | ICD-10-CM | POA: Diagnosis not present

## 2022-07-15 DIAGNOSIS — S76011A Strain of muscle, fascia and tendon of right hip, initial encounter: Secondary | ICD-10-CM | POA: Diagnosis not present

## 2022-07-15 DIAGNOSIS — M7071 Other bursitis of hip, right hip: Secondary | ICD-10-CM | POA: Diagnosis not present

## 2022-07-15 DIAGNOSIS — S73191A Other sprain of right hip, initial encounter: Secondary | ICD-10-CM | POA: Diagnosis not present

## 2022-07-15 DIAGNOSIS — M24851 Other specific joint derangements of right hip, not elsewhere classified: Secondary | ICD-10-CM | POA: Diagnosis not present

## 2022-07-15 DIAGNOSIS — M1611 Unilateral primary osteoarthritis, right hip: Secondary | ICD-10-CM | POA: Diagnosis not present

## 2022-07-15 DIAGNOSIS — M67951 Unspecified disorder of synovium and tendon, right thigh: Secondary | ICD-10-CM | POA: Diagnosis not present

## 2022-07-20 DIAGNOSIS — L814 Other melanin hyperpigmentation: Secondary | ICD-10-CM | POA: Diagnosis not present

## 2022-07-20 DIAGNOSIS — D485 Neoplasm of uncertain behavior of skin: Secondary | ICD-10-CM | POA: Diagnosis not present

## 2022-07-20 DIAGNOSIS — L821 Other seborrheic keratosis: Secondary | ICD-10-CM | POA: Diagnosis not present

## 2022-07-20 DIAGNOSIS — D225 Melanocytic nevi of trunk: Secondary | ICD-10-CM | POA: Diagnosis not present

## 2022-07-20 DIAGNOSIS — Z86018 Personal history of other benign neoplasm: Secondary | ICD-10-CM | POA: Diagnosis not present

## 2022-07-20 DIAGNOSIS — D2271 Melanocytic nevi of right lower limb, including hip: Secondary | ICD-10-CM | POA: Diagnosis not present

## 2022-07-29 DIAGNOSIS — M7061 Trochanteric bursitis, right hip: Secondary | ICD-10-CM | POA: Diagnosis not present

## 2022-08-02 ENCOUNTER — Other Ambulatory Visit (HOSPITAL_COMMUNITY): Payer: Self-pay

## 2022-08-05 ENCOUNTER — Other Ambulatory Visit (HOSPITAL_COMMUNITY): Payer: Self-pay

## 2022-08-10 ENCOUNTER — Encounter: Payer: Self-pay | Admitting: Internal Medicine

## 2022-08-11 ENCOUNTER — Other Ambulatory Visit (HOSPITAL_COMMUNITY): Payer: Self-pay

## 2022-08-12 ENCOUNTER — Encounter: Payer: Self-pay | Admitting: Internal Medicine

## 2022-08-12 ENCOUNTER — Telehealth: Payer: Self-pay | Admitting: Internal Medicine

## 2022-08-12 DIAGNOSIS — E785 Hyperlipidemia, unspecified: Secondary | ICD-10-CM | POA: Diagnosis not present

## 2022-08-12 NOTE — Telephone Encounter (Signed)
Called patient to confirm rescheduled appointments. Patient notified.

## 2022-08-14 LAB — NMR, LIPOPROFILE
Cholesterol, Total: 168 mg/dL (ref 100–199)
HDL Particle Number: 36.6 umol/L (ref >=30.5)
HDL-C: 94 mg/dL (ref >39)
LDL Particle Number: 519 nmol/L (ref <1000)
LDL Size: 21 nm (ref >20.5)
LDL-C (NIH Calc): 63 mg/dL (ref 0–99)
LP-IR Score: <25 (ref <=45)
Small LDL Particle Number: <90 nmol/L (ref <=527)
Triglycerides: 54 mg/dL (ref 0–149)

## 2022-08-17 ENCOUNTER — Other Ambulatory Visit: Payer: Self-pay | Admitting: Medical Oncology

## 2022-08-18 ENCOUNTER — Other Ambulatory Visit: Payer: Self-pay | Admitting: Internal Medicine

## 2022-08-18 ENCOUNTER — Other Ambulatory Visit: Payer: Self-pay | Admitting: Medical Oncology

## 2022-08-18 ENCOUNTER — Telehealth: Payer: Self-pay | Admitting: Internal Medicine

## 2022-08-18 ENCOUNTER — Encounter: Payer: Self-pay | Admitting: Medical Oncology

## 2022-08-18 DIAGNOSIS — C3490 Malignant neoplasm of unspecified part of unspecified bronchus or lung: Secondary | ICD-10-CM

## 2022-08-18 DIAGNOSIS — Z91041 Radiographic dye allergy status: Secondary | ICD-10-CM

## 2022-08-18 MED ORDER — PREDNISONE 50 MG PO TABS: ORAL_TABLET | ORAL | 3 refills | Status: DC

## 2022-08-18 NOTE — Telephone Encounter (Signed)
Scheduled per 03/06 scheduling message, patient has been called and notified.

## 2022-08-22 ENCOUNTER — Ambulatory Visit: Payer: Medicare Other | Admitting: Internal Medicine

## 2022-08-22 ENCOUNTER — Other Ambulatory Visit: Payer: Medicare Other

## 2022-08-23 DIAGNOSIS — M818 Other osteoporosis without current pathological fracture: Secondary | ICD-10-CM | POA: Diagnosis not present

## 2022-08-24 ENCOUNTER — Other Ambulatory Visit (HOSPITAL_COMMUNITY): Payer: Self-pay

## 2022-08-26 ENCOUNTER — Inpatient Hospital Stay: Payer: Medicare Other | Attending: Internal Medicine

## 2022-08-26 ENCOUNTER — Ambulatory Visit (HOSPITAL_BASED_OUTPATIENT_CLINIC_OR_DEPARTMENT_OTHER)
Admission: RE | Admit: 2022-08-26 | Discharge: 2022-08-26 | Disposition: A | Discharge status: Home / Self Care | Payer: Medicare Other | Source: Ambulatory Visit | Attending: Internal Medicine | Admitting: Internal Medicine

## 2022-08-26 DIAGNOSIS — C3490 Malignant neoplasm of unspecified part of unspecified bronchus or lung: Secondary | ICD-10-CM | POA: Diagnosis not present

## 2022-08-26 DIAGNOSIS — Z902 Acquired absence of lung [part of]: Secondary | ICD-10-CM | POA: Insufficient documentation

## 2022-08-26 DIAGNOSIS — R918 Other nonspecific abnormal finding of lung field: Secondary | ICD-10-CM | POA: Diagnosis not present

## 2022-08-26 DIAGNOSIS — Z923 Personal history of irradiation: Secondary | ICD-10-CM | POA: Insufficient documentation

## 2022-08-26 DIAGNOSIS — K828 Other specified diseases of gallbladder: Secondary | ICD-10-CM | POA: Diagnosis not present

## 2022-08-26 DIAGNOSIS — Z9221 Personal history of antineoplastic chemotherapy: Secondary | ICD-10-CM | POA: Insufficient documentation

## 2022-08-26 DIAGNOSIS — C3411 Malignant neoplasm of upper lobe, right bronchus or lung: Secondary | ICD-10-CM | POA: Insufficient documentation

## 2022-08-26 DIAGNOSIS — N951 Menopausal and female climacteric states: Secondary | ICD-10-CM | POA: Insufficient documentation

## 2022-08-26 DIAGNOSIS — Z79899 Other long term (current) drug therapy: Secondary | ICD-10-CM | POA: Insufficient documentation

## 2022-08-26 LAB — CBC WITH DIFFERENTIAL (CANCER CENTER ONLY)
Abs Immature Granulocytes: 0.01 10*3/uL (ref 0.00–0.07)
Basophils Absolute: 0 10*3/uL (ref 0.0–0.1)
Basophils Relative: 0 %
Eosinophils Absolute: 0 10*3/uL (ref 0.0–0.5)
Eosinophils Relative: 0 %
HCT: 41.4 % (ref 36.0–46.0)
Hemoglobin: 13.7 g/dL (ref 12.0–15.0)
Immature Granulocytes: 0 %
Lymphocytes Relative: 13 %
Lymphs Abs: 0.7 10*3/uL (ref 0.7–4.0)
MCH: 31.1 pg (ref 26.0–34.0)
MCHC: 33.1 g/dL (ref 30.0–36.0)
MCV: 94.1 fL (ref 80.0–100.0)
Monocytes Absolute: 0.2 10*3/uL (ref 0.1–1.0)
Monocytes Relative: 3 %
Neutro Abs: 4.4 10*3/uL (ref 1.7–7.7)
Neutrophils Relative %: 84 %
Platelet Count: 183 10*3/uL (ref 150–400)
RBC: 4.4 MIL/uL (ref 3.87–5.11)
RDW: 13 % (ref 11.5–15.5)
WBC Count: 5.3 10*3/uL (ref 4.0–10.5)
nRBC: 0 % (ref 0.0–0.2)

## 2022-08-26 LAB — CMP (CANCER CENTER ONLY)
ALT: 20 U/L (ref 0–44)
AST: 23 U/L (ref 15–41)
Albumin: 4.4 g/dL (ref 3.5–5.0)
Alkaline Phosphatase: 50 U/L (ref 38–126)
Anion gap: 5 (ref 5–15)
BUN: 28 mg/dL — ABNORMAL HIGH (ref 8–23)
CO2: 31 mmol/L (ref 22–32)
Calcium: 9.4 mg/dL (ref 8.9–10.3)
Chloride: 101 mmol/L (ref 98–111)
Creatinine: 0.96 mg/dL (ref 0.44–1.00)
GFR, Estimated: >60 mL/min (ref >60)
Glucose, Bld: 180 mg/dL — ABNORMAL HIGH (ref 70–99)
Potassium: 4.2 mmol/L (ref 3.5–5.1)
Sodium: 137 mmol/L (ref 135–145)
Total Bilirubin: 0.4 mg/dL (ref 0.3–1.2)
Total Protein: 6.8 g/dL (ref 6.5–8.1)

## 2022-08-26 MED ORDER — IOHEXOL 300 MG/ML  SOLN
65.0000 mL | Freq: Once | INTRAMUSCULAR | Status: AC | PRN
  Administered 2022-08-26: 65 mL via INTRAVENOUS

## 2022-08-29 ENCOUNTER — Encounter: Payer: Self-pay | Admitting: Medical Oncology

## 2022-08-29 ENCOUNTER — Other Ambulatory Visit: Payer: Self-pay

## 2022-08-29 ENCOUNTER — Encounter: Payer: Self-pay | Admitting: Internal Medicine

## 2022-08-30 ENCOUNTER — Other Ambulatory Visit: Payer: Medicare Other

## 2022-08-30 ENCOUNTER — Inpatient Hospital Stay (HOSPITAL_BASED_OUTPATIENT_CLINIC_OR_DEPARTMENT_OTHER): Payer: Medicare Other | Admitting: Internal Medicine

## 2022-08-30 VITALS — BP 111/72 | HR 83 | Temp 98.0°F | Resp 17 | Wt 92.0 lb

## 2022-08-30 DIAGNOSIS — C3411 Malignant neoplasm of upper lobe, right bronchus or lung: Secondary | ICD-10-CM

## 2022-08-30 DIAGNOSIS — Z923 Personal history of irradiation: Secondary | ICD-10-CM | POA: Diagnosis not present

## 2022-08-30 DIAGNOSIS — C3491 Malignant neoplasm of unspecified part of right bronchus or lung: Secondary | ICD-10-CM | POA: Diagnosis not present

## 2022-08-30 DIAGNOSIS — Z79899 Other long term (current) drug therapy: Secondary | ICD-10-CM | POA: Diagnosis not present

## 2022-08-30 DIAGNOSIS — N951 Menopausal and female climacteric states: Secondary | ICD-10-CM | POA: Diagnosis not present

## 2022-08-30 DIAGNOSIS — Z902 Acquired absence of lung [part of]: Secondary | ICD-10-CM | POA: Diagnosis not present

## 2022-08-30 DIAGNOSIS — Z9221 Personal history of antineoplastic chemotherapy: Secondary | ICD-10-CM | POA: Diagnosis not present

## 2022-08-30 NOTE — Progress Notes (Signed)
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  PRIOR THERAPY: 1) Right VATS with right upper lobectomy with bronchoplasty closure and en bloc resection of a wedge from the superior segment of the right lower lobe in addition to mediastinal lymph node dissection on 07/30/2015 under the care of Dr. Roxan Hockey. 2) Adjuvant systemic chemotherapy with cisplatin 75 MG/M2 and Alimta 500 MG/M2 every 3 weeks. First dose was given on 09/10/2015. Status post 4 cycles. 3) adjuvant radiotherapy under the care of Dr. Tammi Klippel.  CURRENT THERAPY: Tagrisso 80 mg p.o. daily started 12/01/2018.  Status post 45 months of treatment.  INTERVAL HISTORY: [name] West 72 y.o. female returns to clinic today for follow-up visit.  The patient is feeling fine today with no concerning complaints except for hot flashes that started few weeks ago.  She denied having any current chest pain, shortness of breath, cough or hemoptysis.  She has no nausea, vomiting, diarrhea or constipation.  She has no headache or visual changes.  She has no recent weight loss but has night sweats.  She had repeat CT scan of the chest, abdomen and pelvis performed recently and she is here for evaluation and discussion of her scan results.  MEDICAL HISTORY: Past Medical History:  Diagnosis Date   Anxiety    Arthritis    shoulders   Cancer ([place]) 06/2014   Lung Cancer   Complication of anesthesia    nausea, does well with PROPOFOL    Cough 09/17/2015   Encounter for antineoplastic chemotherapy 10/22/2015    Environmental and seasonal allergies    Family history of adverse reaction to anesthesia    Patients grandmother died while having hand surgery; pt unsure of cause   Oral thrush 10/22/2015   Ovarian cyst    PONV (postoperative nausea and vomiting)    Raynaud disease     ALLERGIES:  is allergic to avelox [moxifloxacin hcl in nacl], cephalexin, cephalosporins, ciprofloxacin hcl, clindamycin/lincomycin, penicillins, shrimp [shellfish allergy], macrobid [nitrofurantoin monohyd macro], omnipaque [iohexol], azithromycin, fluconazole, adhesive [tape], lidoderm [lidocaine], pseudoephedrine, and sulfa antibiotics.  MEDICATIONS:  Current Outpatient Medications  Medication Sig Dispense Refill   cholecalciferol (VITAMIN D) 1000 UNITS tablet Take 1,000 Units by mouth daily.     citalopram (CELEXA) 10 MG tablet Take 10 mg by mouth daily.     clobetasol ointment (TEMOVATE) [id number] % 1 application     Clobetasol Prop Oint-Coal Tar (CLOBETAPLUS OINTMENT EX) Apply 1 Dose topically daily.     EPINEPHrine 0.3 mg/0.3 mL IJ SOAJ injection Inject into the muscle as directed.     estradiol (ESTRACE) 0.1 MG/GM vaginal cream See admin instructions.     ibuprofen (ADVIL,MOTRIN) 200 MG tablet Take 200 mg by mouth every 6 (six) hours as needed (pain).     levocetirizine (XYZAL) 5 MG tablet 1 tablet in the evening     LORazepam (ATIVAN) 0.5 MG tablet Take 0.5 mg by mouth at bedtime as needed for anxiety or sleep.      Multiple Vitamin (MULTIVITAMIN WITH MINERALS) TABS Take 1 tablet by mouth daily.     NONFORMULARY OR COMPOUNDED ITEM Place 1  application vaginally 2 (two) times a week. Estradiol 0.02% Cream     osimertinib mesylate (TAGRISSO) 80 MG tablet TAKE 1 TABLET (80 MG TOTAL) BY MOUTH DAILY. 60 tablet 2   polyethylene glycol powder (GLYCOLAX/MIRALAX) 17 GM/SCOOP powder See admin instructions.     predniSONE (DELTASONE) 50 MG tablet TAKE 1 TABLET BY MOUTH 13 HOURS PRIOR TO CT, 1 TABLET 7 HOURS PRIOR & 1 TABLET 1 HOUR  PRIOR TO CT 3 tablet 3   Probiotic Product (ALIGN) 4 MG CAPS Take 1 capsule by mouth daily.     rosuvastatin (CRESTOR) 5 MG tablet Take 1 tablet (5 mg total) by mouth daily. 90 tablet 3   Wheat Dextrin (BENEFIBER) POWD See admin instructions.     No current facility-administered medications for this visit.    SURGICAL HISTORY:  Past Surgical History:  Procedure Laterality Date   BREAST EXCISIONAL BIOPSY Left    [place]   COLONOSCOPY WITH PROPOFOL N/A 09/27/2012   Procedure: COLONOSCOPY WITH PROPOFOL;  Surgeon: Garlan Fair, MD;  Location: [facility];  Service: Endoscopy;  Laterality: N/A;   Fibroid Removed     LAPAROTOMY Left 07/20/2016   Procedure: MINI LAPAROTOMY, REMOVAL OF LEFT OVARIAN CYST, LEFT SALPINGO-OOPHORECTOMY;  Surgeon: Dian Queen, MD;  Location: [facility];  Service: Gynecology;  Laterality: Left;  please moved to 7:30am if spot opens   Right Rotator Cuff Right    TOTAL SHOULDER ARTHROPLASTY Left 06/14/2018   Procedure: TOTAL SHOULDER ARTHROPLASTY;  Surgeon: Justice Britain, MD;  Location: [facility];  Service: Orthopedics;  Laterality: Left;  137min   VIDEO ASSISTED THORACOSCOPY (VATS)/ LOBECTOMY Right 07/30/2015   Procedure: RIGHT VIDEO ASSISTED THORACOSCOPY (VATS)/RIGHT UPPER LOBECTOMY;  Surgeon: Melrose Nakayama, MD;  Location: [facility];  Service: Thoracic;  Laterality: Right;   VIDEO BRONCHOSCOPY WITH ENDOBRONCHIAL NAVIGATION N/A 07/15/2015   Procedure: VIDEO BRONCHOSCOPY WITH ENDOBRONCHIAL NAVIGATION with Biopsy;  Surgeon: Collene Gobble, MD;  Location: [facility];  Service: Thoracic;  Laterality: N/A;    REVIEW OF SYSTEMS:  Constitutional: positive for night sweats Eyes: negative Ears, nose, mouth, throat, and face: negative Respiratory: negative Cardiovascular: negative Gastrointestinal: negative Genitourinary:negative Integument/breast: negative Hematologic/lymphatic: negative Musculoskeletal:negative Neurological: negative Behavioral/Psych:  negative Endocrine: negative Allergic/Immunologic: negative   PHYSICAL EXAMINATION: General appearance: alert, cooperative, and no distress Head: Normocephalic, without obvious abnormality, atraumatic Neck: no adenopathy, no JVD, supple, symmetrical, trachea midline, and thyroid not enlarged, symmetric, no tenderness/mass/nodules Lymph nodes: Cervical, supraclavicular, and axillary nodes normal. Resp: clear to auscultation bilaterally Back: symmetric, no curvature. ROM normal. No CVA tenderness. Cardio: regular rate and rhythm, S1, S2 normal, no murmur, click, rub or gallop GI: soft, non-tender; bowel sounds normal; no masses,  no organomegaly Extremities: extremities normal, atraumatic, no cyanosis or edema Neurologic: Alert and oriented X 3, normal strength and tone. Normal symmetric reflexes. Normal coordination and gait  ECOG PERFORMANCE STATUS: 0 - Asymptomatic  Blood pressure 107/63, pulse 81, temperature 98 F (36.7 C), resp. rate 16, weight 90 lb 9 oz (41.1 kg), SpO2 100 %.   LABORATORY DATA: Lab Results  Component Value Date   WBC 10.7 (H) 12/22/2021   HGB 13.9 12/22/2021   HCT 41.1 12/22/2021   MCV 91.1 12/22/2021   PLT 253 12/22/2021      Chemistry      Component Value Date/Time   NA 135 12/22/2021 0955   NA 139 06/16/2017 1132   K 4.0 12/22/2021 0955   K 4.7 06/16/2017 1132  CL 99 12/22/2021 0955   CO2 27 12/22/2021 0955   CO2 28 06/16/2017 1132   BUN 29 (H) 12/22/2021 0955   BUN 19.5 06/16/2017 1132   CREATININE 0.95 12/22/2021 0955   CREATININE 0.8 06/16/2017 1132      Component Value Date/Time   CALCIUM 9.7 12/22/2021 0955   CALCIUM 10.0 06/16/2017 1132   ALKPHOS 57 12/22/2021 0955   ALKPHOS 68 06/16/2017 1132   AST 25 12/22/2021 0955   AST 21 06/16/2017 1132   ALT 22 12/22/2021 0955   ALT 18 06/16/2017 1132   BILITOT 0.4 12/22/2021 0955   BILITOT 0.50 06/16/2017 1132       RADIOGRAPHIC STUDIES: CT Chest W Contrast  Result Date:  05/23/2022 CLINICAL DATA:  Non-small-cell lung cancer, restaging. Prior chemotherapy and radiation therapy. Prior right upper lobectomy. * Tracking Code: BO * EXAM: CT CHEST, ABDOMEN, AND PELVIS WITH CONTRAST TECHNIQUE: Multidetector CT imaging of the chest, abdomen and pelvis was performed following the standard protocol during bolus administration of intravenous contrast. RADIATION DOSE REDUCTION: This exam was performed according to the departmental dose-optimization program which includes automated exposure control, adjustment of the mA and/or kV according to patient size and/or use of iterative reconstruction technique. CONTRAST:  123mL OMNIPAQUE IOHEXOL 300 MG/ML  SOLN COMPARISON:  12/22/2021 FINDINGS: CT CHEST FINDINGS Cardiovascular: Beam hardening artifact from left shoulder arthroplasty. Aortic atherosclerosis. Normal heart size, without pericardial effusion. Lad coronary artery calcification. No central pulmonary embolism, on this non-dedicated study. Mediastinum/Nodes: No mediastinal or hilar adenopathy. Lungs/Pleura: No pleural fluid. Status post right upper lobectomy. Moderate centrilobular emphysema. Right apical pleuroparenchymal scarring. Pleural-based right lower lobe 5 mm pulmonary nodule is unchanged on 77/507. More cephalad pleural-based right lower lobe 4 mm nodule is similar on 63/507. 2-3 mm left lower lobe pulmonary nodule is similar on 78/507. Musculoskeletal: No acute osseous abnormality. CT ABDOMEN PELVIS FINDINGS Hepatobiliary: Normal liver. Normal gallbladder, without biliary ductal dilatation. Pancreas: Normal, without mass or ductal dilatation. Spleen: Normal in size, without focal abnormality. Adrenals/Urinary Tract: Normal adrenal glands. Bilateral extrarenal pelvis without renal abnormality or hydronephrosis. Normal urinary bladder. Stomach/Bowel: Normal stomach, without wall thickening. Colonic stool burden suggests constipation. Normal small bowel. Vascular/Lymphatic: Aortic  atherosclerosis. No abdominopelvic adenopathy. Reproductive: Normal uterus and adnexa. Other: No significant free fluid. Pelvic floor laxity. No free intraperitoneal air. No evidence of omental or peritoneal disease. Musculoskeletal: No acute osseous abnormality. IMPRESSION: 1. Status post right upper lobectomy, without findings of recurrent or metastatic disease. 2. Stable bilateral pulmonary nodules, favoring a benign etiology. 3. Coronary artery atherosclerosis. Aortic Atherosclerosis (ICD10-I70.0). 4.  Possible constipation. Electronically Signed   By: Abigail Miyamoto M.D.   On: 05/23/2022 15:12   CT Abdomen Pelvis W Contrast  Result Date: 05/23/2022 CLINICAL DATA:  Non-small-cell lung cancer, restaging. Prior chemotherapy and radiation therapy. Prior right upper lobectomy. * Tracking Code: BO * EXAM: CT CHEST, ABDOMEN, AND PELVIS WITH CONTRAST TECHNIQUE: Multidetector CT imaging of the chest, abdomen and pelvis was performed following the standard protocol during bolus administration of intravenous contrast. RADIATION DOSE REDUCTION: This exam was performed according to the departmental dose-optimization program which includes automated exposure control, adjustment of the mA and/or kV according to patient size and/or use of iterative reconstruction technique. CONTRAST:  170mL OMNIPAQUE IOHEXOL 300 MG/ML  SOLN COMPARISON:  12/22/2021 FINDINGS: CT CHEST FINDINGS Cardiovascular: Beam hardening artifact from left shoulder arthroplasty. Aortic atherosclerosis. Normal heart size, without pericardial effusion. Lad coronary artery calcification. No central pulmonary embolism, on this non-dedicated study. Mediastinum/Nodes:  No mediastinal or hilar adenopathy. Lungs/Pleura: No pleural fluid. Status post right upper lobectomy. Moderate centrilobular emphysema. Right apical pleuroparenchymal scarring. Pleural-based right lower lobe 5 mm pulmonary nodule is unchanged on 77/507. More cephalad pleural-based right lower lobe  4 mm nodule is similar on 63/507. 2-3 mm left lower lobe pulmonary nodule is similar on 78/507. Musculoskeletal: No acute osseous abnormality. CT ABDOMEN PELVIS FINDINGS Hepatobiliary: Normal liver. Normal gallbladder, without biliary ductal dilatation. Pancreas: Normal, without mass or ductal dilatation. Spleen: Normal in size, without focal abnormality. Adrenals/Urinary Tract: Normal adrenal glands. Bilateral extrarenal pelvis without renal abnormality or hydronephrosis. Normal urinary bladder. Stomach/Bowel: Normal stomach, without wall thickening. Colonic stool burden suggests constipation. Normal small bowel. Vascular/Lymphatic: Aortic atherosclerosis. No abdominopelvic adenopathy. Reproductive: Normal uterus and adnexa. Other: No significant free fluid. Pelvic floor laxity. No free intraperitoneal air. No evidence of omental or peritoneal disease. Musculoskeletal: No acute osseous abnormality. IMPRESSION: 1. Status post right upper lobectomy, without findings of recurrent or metastatic disease. 2. Stable bilateral pulmonary nodules, favoring a benign etiology. 3. Coronary artery atherosclerosis. Aortic Atherosclerosis (ICD10-I70.0). 4.  Possible constipation. Electronically Signed   By: Abigail Miyamoto M.D.   On: 05/23/2022 15:12    ASSESSMENT AND PLAN:  This is a very pleasant 72 years old white female with recurrent non-small cell lung cancer initially diagnosed as stage IIIA non-small cell lung cancer, adenocarcinoma with positive EGFR mutation in exon 19 status post right upper lobectomy with lymph node dissection followed by adjuvant systemic chemotherapy as well as adjuvant radiation. The patient has been on observation for almost 3 years.   Unfortunately her scan in June 2020 showed multiple tiny pulmonary nodules scattered throughout both lungs which many of them have increased in size and some new nodule suspected the previous scan.  The patient started treatment with Tagrisso 80 mg p.o. daily  status post 45 months of treatment.   The patient has been tolerating her treatment with Tagrisso fairly well. She had repeat CT scan of the chest, abdomen and pelvis performed recently.  I personally and independently reviewed the scan and discussed the results with the patient today. Her scan showed no concerning findings for disease progression. I recommended for her to continue her current treatment with Tagrisso 80 mg p.o. daily. I will see her back for follow-up visit in 3 months for evaluation with repeat blood work. For the hot flashes, she will consult with her primary care physician.  I also advised her to use vitamin E as a supplement. The patient was advised to call immediately if she has any other concerning symptoms in the interval. The patient voices understanding of current disease status and treatment options and is in agreement with the current care plan. All questions were answered. The patient knows to call the clinic with any problems, questions or concerns. We can certainly see the patient much sooner if necessary. The total time spent in the appointment was 30 minutes.  Disclaimer: This note was dictated with voice recognition software. Similar sounding words can inadvertently be transcribed and may not be corrected upon review.

## 2022-08-31 ENCOUNTER — Other Ambulatory Visit (HOSPITAL_COMMUNITY): Payer: Self-pay

## 2022-09-01 ENCOUNTER — Encounter (HOSPITAL_BASED_OUTPATIENT_CLINIC_OR_DEPARTMENT_OTHER): Payer: Self-pay | Admitting: Internal Medicine

## 2022-09-01 ENCOUNTER — Ambulatory Visit (INDEPENDENT_AMBULATORY_CARE_PROVIDER_SITE_OTHER): Payer: Medicare Other | Admitting: Internal Medicine

## 2022-09-01 ENCOUNTER — Telehealth: Payer: Self-pay | Admitting: Internal Medicine

## 2022-09-01 VITALS — BP 108/66 | HR 79 | Ht 59.0 in | Wt 92.2 lb

## 2022-09-01 DIAGNOSIS — I2584 Coronary atherosclerosis due to calcified coronary lesion: Secondary | ICD-10-CM | POA: Diagnosis not present

## 2022-09-01 DIAGNOSIS — E782 Mixed hyperlipidemia: Secondary | ICD-10-CM | POA: Diagnosis not present

## 2022-09-01 DIAGNOSIS — I2510 Atherosclerotic heart disease of native coronary artery without angina pectoris: Secondary | ICD-10-CM

## 2022-09-01 DIAGNOSIS — E785 Hyperlipidemia, unspecified: Secondary | ICD-10-CM | POA: Diagnosis not present

## 2022-09-01 NOTE — Patient Instructions (Signed)
Medication Instructions:  NO CHANGES  *If you need a refill on your cardiac medications before your next appointment, please call your pharmacy*   Lab Work: FASTING lab work to check cholesterol in 1 year   If you have labs (blood work) drawn today and your tests are completely normal, you will receive your results only by: MyChart Message (if you have MyChart) OR A paper copy in the mail If you have any lab test that is abnormal or we need to change your treatment, we will call you to review the results.    Follow-Up: At [facility], you and your health needs are our priority.  As part of our continuing mission to provide you with exceptional heart care, we have created designated Provider Care Teams.  These Care Teams include your primary Cardiologist (physician) and Advanced Practice Providers (APPs -  Physician Assistants and Nurse Practitioners) who all work together to provide you with the care you need, when you need it.  We recommend signing up for the patient portal called "MyChart".  Sign up information is provided on this After Visit Summary.  MyChart is used to connect with patients for Virtual Visits (Telemedicine).  Patients are able to view lab/test results, encounter notes, upcoming appointments, etc.  Non-urgent messages can be sent to your provider as well.   To learn more about what you can do with MyChart, go to [email or web address].    Your next appointment:    12 months with Dr. Hilty  

## 2022-09-01 NOTE — Progress Notes (Signed)
OFFICE CONSULT NOTE  Chief Complaint:  Follow-up dyslipidemia  Primary Care Physician: [name], MD  HPI:  [name] West is a 72 y.o. female who is being seen today for the evaluation of coronary artery calcification at the request of [name], MD.  This is a pleasant 72 year old female with unfortunate history of non-small cell lung cancer.  She underwent lobectomy by Dr. Roxan Hockey in 2017 with radiation chemotherapy.  Fortunately she seems to be cancer free with some recurrent scans.  Recently she is developed some inguinal lymphadenopathy.  She is on antibiotics for this however her is concerned about a neoplastic process.  Her most recent screening CT scan of the chest did demonstrate coronary artery calcification both in the LAD as well as aortic atherosclerosis which was calcified.  Family history is significant for a brother 49 years older who has coronary artery disease and has had an MI and CABG (however she notes he is live with HIV disease on HAART for more than 30 years).  She denies any history of diabetes or hypertension in fact does not take any daily medications.  Her last lipid profile was November 2018 which showed total cholesterol 210, HDL 81, triglycerides 65 and LDL 116.  She is asymptomatic from an anginal standpoint, but does report shortness of breath with exertion.  She felt like this may mostly be related to her lung cancer and the fact that she has had partial lung removal.  12/22/2017  [name] West returns today for follow-up of her stress echocardiogram.  Fortunately this was a negative study without any exercise-induced wall motion abnormalities and normal LV function.  Overall she feels well except for ongoing shoulder problems.  She is scheduled for sold shoulder surgery in January.  She is supposed to have another CT scan of her chest in December.  We discussed the fact that she does have coronary disease as evidenced by her coronary artery  calcifications.  Although her stress test was low risk, her goal LDL is less than 70.  Currently her LDL is 116.  Given her body habitus, thin weight and fairly healthy diet, is unlikely she is going get a significant increased improvement in her numbers.  I did repeat a lipid profile which was an NMR.  Her LDL-C is 110, and her LDL-P is 1151.  Total cholesterol is 219 and LPA was negative at 21.   03/14/2019  [name] West is seen today in follow-up.  She did undergo successful left shoulder replacement.  Overall she is pleased with that.  She is scheduled to have another CT scan in November of her chest for follow-up of cancer.  This had shown coronary calcification and I recommended statin therapy.  She has been somewhat hesitant to do that.  Recently we repeated an NMR lipid profile.  Her total particle number went from 1151 down to 854.  LDL still remains elevated 102 and HDL was 100, triglycerides 57.  03/25/2020  [name] West returns today for annual follow-up.  Overall she continues to do well.  Denies any chest pain or shortness of breath.  She has had no recurrence of her lung cancer as of recent CT scan.  She does have some LAD atherosclerosis.  Her NMR LDL-P was 844, total cholesterol 197, triglycerides 92, HDL 85 and LDL of 96.  Overall representing good control over her lipids.  She is not currently on any therapy.  06/28/2021  [name] West returns today for follow-up.  She reported  she had a viral illness over the holidays and had lost significant amount of weight.  She was actually underweight and weight 85 pounds.  She then intentionally ate a lot of potentially fattening foods in order to get her weight up to now 91 pounds with a BMI of 18.  She thinks this is responsible for an increase in her lipids which were measured on January 5.  Total cholesterol 197, triglycerides 101, HDL 71 and LDL 108.  8/[id number]  [name] West returns today for follow-up.  She has had further improvement in her lipids with  particle number of 880 down to 545, LDL cholesterol was 113, now 70.  HDL remains high at 90.  Triglyceride 94 and small LDL particles less than 90.  Overall very favorable lipid profile.  Blood pressure is well controlled today.  09/01/2022  [name] West returns today for follow-up.  She has been tolerating rosuvastatin seemingly much better.  She has had some hot flashes which she wondered might be related to the statin however this would be fairly rare in my experience.  Her cholesterol has improved substantially.  LDL particle number down to 519 with LDL-C of 63, HDL-C 94, and triglycerides of 54.  PMHx:  Past Medical History:  Diagnosis Date   Anxiety    Arthritis    shoulders   Cancer ([place]) 06/2014   Lung Cancer   Complication of anesthesia    nausea, does well with PROPOFOL    Cough 09/17/2015   Encounter for antineoplastic chemotherapy 10/22/2015   Environmental and seasonal allergies    Family history of adverse reaction to anesthesia    Patients grandmother died while having hand surgery; pt unsure of cause   Oral thrush 10/22/2015   Ovarian cyst    PONV (postoperative nausea and vomiting)    Raynaud disease     Past Surgical History:  Procedure Laterality Date   BREAST EXCISIONAL BIOPSY Left    [place]   COLONOSCOPY WITH PROPOFOL N/A 09/27/2012   Procedure: COLONOSCOPY WITH PROPOFOL;  Surgeon: Garlan Fair, MD;  Location: [facility];  Service: Endoscopy;  Laterality: N/A;   Fibroid Removed     LAPAROTOMY Left 07/20/2016   Procedure: MINI LAPAROTOMY, REMOVAL OF LEFT OVARIAN CYST, LEFT SALPINGO-OOPHORECTOMY;  Surgeon: Dian Queen, MD;  Location: [facility];  Service: Gynecology;  Laterality: Left;  please moved to 7:30am if spot opens   Right Rotator Cuff Right    TOTAL SHOULDER ARTHROPLASTY Left 06/14/2018   Procedure: TOTAL SHOULDER ARTHROPLASTY;  Surgeon: Justice Britain, MD;  Location: [facility];  Service: Orthopedics;  Laterality: Left;  184min   VIDEO  ASSISTED THORACOSCOPY (VATS)/ LOBECTOMY Right 07/30/2015   Procedure: RIGHT VIDEO ASSISTED THORACOSCOPY (VATS)/RIGHT UPPER LOBECTOMY;  Surgeon: Melrose Nakayama, MD;  Location: [facility];  Service: Thoracic;  Laterality: Right;   VIDEO BRONCHOSCOPY WITH ENDOBRONCHIAL NAVIGATION N/A 07/15/2015   Procedure: VIDEO BRONCHOSCOPY WITH ENDOBRONCHIAL NAVIGATION with Biopsy;  Surgeon: Collene Gobble, MD;  Location: [facility];  Service: Thoracic;  Laterality: N/A;    FAMHx:  Family History  Problem Relation Age of Onset   Heart disease Brother    Heart attack Brother    Heart disease Brother 36       elevated CAC score    SOCHx:   reports that she has never smoked. She has never used smokeless tobacco. She reports current alcohol use. She reports that she does not use drugs.  ALLERGIES:  Allergies  Allergen Reactions  Avelox [Moxifloxacin Hcl In Nacl] Anaphylaxis   Cephalexin Anaphylaxis   Cephalosporins Anaphylaxis   Ciprofloxacin Hcl Shortness Of Breath   Clindamycin/Lincomycin Anaphylaxis   Shrimp [Shellfish Allergy] Diarrhea    "violent reaction to :shrimp and scallops but not other shell fish-vomiting and diarrhea"   Macrobid [Nitrofurantoin Monohyd Macro] Other (See Comments)    CHEST TIGHTNESS...Marland KitchenHAD USED BEFORE WITH NO REACTION   Omnipaque [Iohexol] Hives   Azithromycin Other (See Comments)   Fluconazole Nausea And Vomiting   Moxifloxacin Other (See Comments)   Adhesive [Tape] Rash   Lidoderm [Lidocaine] Rash   Pseudoephedrine Palpitations   Sulfa Antibiotics Rash    ROS: Pertinent items noted in HPI and remainder of comprehensive ROS otherwise negative.  HOME MEDS: Current Outpatient Medications on File Prior to Visit  Medication Sig Dispense Refill   cholecalciferol (VITAMIN D) 1000 UNITS tablet Take 1,000 Units by mouth 3 (three) times a week.     citalopram (CELEXA) 10 MG tablet Take 10 mg by mouth daily.     clobetasol ointment (TEMOVATE) [id number] % 1 application      Clobetasol Prop Oint-Coal Tar (CLOBETAPLUS OINTMENT EX) Apply 1 Dose topically daily.     EPINEPHrine 0.3 mg/0.3 mL IJ SOAJ injection Inject into the muscle as directed.     estradiol (ESTRACE) 0.1 MG/GM vaginal cream See admin instructions.     ibuprofen (ADVIL,MOTRIN) 200 MG tablet Take 200 mg by mouth every 6 (six) hours as needed (pain).     levocetirizine (XYZAL) 5 MG tablet Take 2.5 mg by mouth every evening.     LORazepam (ATIVAN) 0.5 MG tablet Take 0.5 mg by mouth at bedtime as needed for anxiety or sleep.      Multiple Vitamin (MULTIVITAMIN WITH MINERALS) TABS Take 1 tablet by mouth daily.     NONFORMULARY OR COMPOUNDED ITEM Place 1 application vaginally 2 (two) times a week. Estradiol 0.02% Cream     osimertinib mesylate (TAGRISSO) 80 MG tablet TAKE 1 TABLET (80 MG TOTAL) BY MOUTH DAILY. 60 tablet 2   polyethylene glycol powder (GLYCOLAX/MIRALAX) 17 GM/SCOOP powder See admin instructions.     predniSONE (DELTASONE) 50 MG tablet TAKE 1 TABLET BY MOUTH 13 HOURS PRIOR TO CT, 1 TABLET 7 HOURS PRIOR & 1 TABLET 1 HOUR PRIOR TO CT 3 tablet 3   Probiotic Product (ALIGN) 4 MG CAPS Take 1 capsule by mouth daily.     rosuvastatin (CRESTOR) 5 MG tablet Take 1 tablet (5 mg total) by mouth daily. 90 tablet 3   Wheat Dextrin (BENEFIBER) POWD See admin instructions.     No current facility-administered medications on file prior to visit.    LABS/IMAGING: No results found for this or any previous visit (from the past 48 hour(s)).  No results found.   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   Ms. Stepaniak seems to be tolerating low-dose rosuvastatin much better.  Cholesterol is as low as it has ever been.  She is at target and I would recommend we continue with her current therapy.  Will plan repeat lipid testing in 1 year and follow-up with me then.  Pixie Casino, MD, [facility], [place] Director of the Advanced Lipid Disorders &  Cardiovascular Risk Reduction Clinic Diplomate of the American Board of Clinical Lipidology Attending Cardiologist  Direct Dial: [phone]  Fax: [phone]  Website:  [email or web address] 09/01/2022, 4:20 PM

## 2022-09-01 NOTE — Telephone Encounter (Signed)
Per 3/21 IB reached out to patient to schedule;left voicemail.

## 2022-09-02 ENCOUNTER — Telehealth: Payer: Self-pay | Admitting: Internal Medicine

## 2022-09-02 NOTE — Telephone Encounter (Signed)
Per 3/21 IB reached out to patient to reschedule, patient aware of date and times of appointment.

## 2022-09-05 ENCOUNTER — Other Ambulatory Visit (HOSPITAL_COMMUNITY): Payer: Self-pay

## 2022-09-06 ENCOUNTER — Other Ambulatory Visit (HOSPITAL_COMMUNITY): Payer: Self-pay

## 2022-09-06 ENCOUNTER — Telehealth: Payer: Self-pay | Admitting: Pharmacy Technician

## 2022-09-06 NOTE — Telephone Encounter (Signed)
Oral Oncology Patient Advocate Encounter   Received request from [facility] to submit label information and receipts for prior fills of Tagrisso to [facility].   Submitted fill reports and receipts for Tagrisso fills from 04/2022 & 05/2022 to [facility] via e-fax [phone].  Lady Deutscher, CPhT-Adv Oncology [facility]acy [facility] [place] Direct Number: [phone]  Fax: [phone]

## 2022-09-08 ENCOUNTER — Encounter: Payer: Self-pay | Admitting: Internal Medicine

## 2022-09-13 ENCOUNTER — Telehealth: Payer: Self-pay | Admitting: Internal Medicine

## 2022-09-13 NOTE — Telephone Encounter (Signed)
Patient called to reschedule 6/13 appointment due to being out of town. Patient rescheduled and notified.

## 2022-09-15 ENCOUNTER — Other Ambulatory Visit (HOSPITAL_COMMUNITY): Payer: Self-pay

## 2022-09-16 ENCOUNTER — Ambulatory Visit (HOSPITAL_COMMUNITY)
Admission: RE | Admit: 2022-09-16 | Discharge: 2022-09-16 | Disposition: A | Discharge status: Home / Self Care | Payer: Medicare Other | Source: Ambulatory Visit | Attending: Family Medicine | Admitting: Family Medicine

## 2022-09-16 ENCOUNTER — Other Ambulatory Visit (HOSPITAL_COMMUNITY): Payer: Self-pay

## 2022-09-16 DIAGNOSIS — M810 Age-related osteoporosis without current pathological fracture: Secondary | ICD-10-CM | POA: Insufficient documentation

## 2022-09-16 MED ORDER — ZOLEDRONIC ACID 5 MG/100ML IV SOLN
5.0000 mg | Freq: Once | INTRAVENOUS | Status: AC
  Administered 2022-09-16: 5 mg via INTRAVENOUS

## 2022-09-16 MED ORDER — ZOLEDRONIC ACID 5 MG/100ML IV SOLN
INTRAVENOUS | Status: AC
  Filled 2022-09-16: qty 100

## 2022-09-20 DIAGNOSIS — Z23 Encounter for immunization: Secondary | ICD-10-CM | POA: Diagnosis not present

## 2022-09-30 ENCOUNTER — Other Ambulatory Visit (HOSPITAL_COMMUNITY): Payer: Self-pay

## 2022-10-03 DIAGNOSIS — R61 Generalized hyperhidrosis: Secondary | ICD-10-CM | POA: Diagnosis not present

## 2022-10-03 DIAGNOSIS — Z681 Body mass index (BMI) 19 or less, adult: Secondary | ICD-10-CM | POA: Diagnosis not present

## 2022-10-05 ENCOUNTER — Other Ambulatory Visit (HOSPITAL_COMMUNITY): Payer: Self-pay

## 2022-11-01 ENCOUNTER — Other Ambulatory Visit: Payer: Self-pay | Admitting: Internal Medicine

## 2022-11-01 ENCOUNTER — Other Ambulatory Visit (HOSPITAL_COMMUNITY): Payer: Self-pay

## 2022-11-01 DIAGNOSIS — C3491 Malignant neoplasm of unspecified part of right bronchus or lung: Secondary | ICD-10-CM

## 2022-11-01 MED ORDER — OSIMERTINIB MESYLATE 80 MG PO TABS
ORAL_TABLET | Freq: Every day | ORAL | 2 refills | Status: DC
  Filled 2022-11-01: qty 30, 30d supply, fill #0
  Filled 2022-11-29: qty 30, 30d supply, fill #1
  Filled 2022-12-28: qty 30, 30d supply, fill #2
  Filled 2023-01-26: qty 30, 30d supply, fill #3
  Filled 2023-02-24: qty 30, 30d supply, fill #4
  Filled 2023-03-29: qty 30, 30d supply, fill #5

## 2022-11-02 ENCOUNTER — Other Ambulatory Visit: Payer: Self-pay

## 2022-11-02 ENCOUNTER — Other Ambulatory Visit (HOSPITAL_COMMUNITY): Payer: Self-pay

## 2022-11-03 ENCOUNTER — Other Ambulatory Visit (HOSPITAL_COMMUNITY): Payer: Self-pay

## 2022-11-03 ENCOUNTER — Other Ambulatory Visit: Payer: Self-pay

## 2022-11-24 ENCOUNTER — Other Ambulatory Visit: Payer: Medicare Other

## 2022-11-24 ENCOUNTER — Ambulatory Visit: Payer: Medicare Other | Admitting: Internal Medicine

## 2022-11-29 ENCOUNTER — Other Ambulatory Visit (HOSPITAL_COMMUNITY): Payer: Self-pay

## 2022-11-30 ENCOUNTER — Other Ambulatory Visit: Payer: Self-pay | Admitting: Medical Oncology

## 2022-11-30 DIAGNOSIS — Z7189 Other specified counseling: Secondary | ICD-10-CM | POA: Diagnosis not present

## 2022-11-30 DIAGNOSIS — Z751 Person awaiting admission to adequate facility elsewhere: Secondary | ICD-10-CM | POA: Diagnosis not present

## 2022-11-30 DIAGNOSIS — C3491 Malignant neoplasm of unspecified part of right bronchus or lung: Secondary | ICD-10-CM

## 2022-11-30 DIAGNOSIS — Z681 Body mass index (BMI) 19 or less, adult: Secondary | ICD-10-CM | POA: Diagnosis not present

## 2022-12-01 ENCOUNTER — Inpatient Hospital Stay (HOSPITAL_BASED_OUTPATIENT_CLINIC_OR_DEPARTMENT_OTHER): Payer: Medicare Other | Admitting: Internal Medicine

## 2022-12-01 ENCOUNTER — Inpatient Hospital Stay: Payer: Medicare Other | Attending: Internal Medicine

## 2022-12-01 ENCOUNTER — Ambulatory Visit: Payer: Medicare Other | Admitting: Internal Medicine

## 2022-12-01 ENCOUNTER — Other Ambulatory Visit: Payer: Self-pay

## 2022-12-01 VITALS — BP 105/58 | HR 74 | Temp 97.5°F | Resp 16 | Ht 59.0 in | Wt 90.7 lb

## 2022-12-01 DIAGNOSIS — C3490 Malignant neoplasm of unspecified part of unspecified bronchus or lung: Secondary | ICD-10-CM | POA: Diagnosis not present

## 2022-12-01 DIAGNOSIS — C3411 Malignant neoplasm of upper lobe, right bronchus or lung: Secondary | ICD-10-CM | POA: Insufficient documentation

## 2022-12-01 DIAGNOSIS — Z7952 Long term (current) use of systemic steroids: Secondary | ICD-10-CM | POA: Insufficient documentation

## 2022-12-01 DIAGNOSIS — Z79899 Other long term (current) drug therapy: Secondary | ICD-10-CM | POA: Insufficient documentation

## 2022-12-01 DIAGNOSIS — Z902 Acquired absence of lung [part of]: Secondary | ICD-10-CM | POA: Diagnosis not present

## 2022-12-01 DIAGNOSIS — C3491 Malignant neoplasm of unspecified part of right bronchus or lung: Secondary | ICD-10-CM

## 2022-12-01 LAB — CBC WITH DIFFERENTIAL (CANCER CENTER ONLY)
Abs Immature Granulocytes: 0.01 10*3/uL (ref 0.00–0.07)
Basophils Absolute: 0 10*3/uL (ref 0.0–0.1)
Basophils Relative: 0 %
Eosinophils Absolute: 0.1 10*3/uL (ref 0.0–0.5)
Eosinophils Relative: 1 %
HCT: 39.2 % (ref 36.0–46.0)
Hemoglobin: 13 g/dL (ref 12.0–15.0)
Immature Granulocytes: 0 %
Lymphocytes Relative: 20 %
Lymphs Abs: 1.1 10*3/uL (ref 0.7–4.0)
MCH: 31.1 pg (ref 26.0–34.0)
MCHC: 33.2 g/dL (ref 30.0–36.0)
MCV: 93.8 fL (ref 80.0–100.0)
Monocytes Absolute: 0.7 10*3/uL (ref 0.1–1.0)
Monocytes Relative: 13 %
Neutro Abs: 3.8 10*3/uL (ref 1.7–7.7)
Neutrophils Relative %: 66 %
Platelet Count: 173 10*3/uL (ref 150–400)
RBC: 4.18 MIL/uL (ref 3.87–5.11)
RDW: 13 % (ref 11.5–15.5)
WBC Count: 5.8 10*3/uL (ref 4.0–10.5)
nRBC: 0 % (ref 0.0–0.2)

## 2022-12-01 LAB — CMP (CANCER CENTER ONLY)
ALT: 17 U/L (ref 0–44)
AST: 23 U/L (ref 15–41)
Albumin: 4 g/dL (ref 3.5–5.0)
Alkaline Phosphatase: 45 U/L (ref 38–126)
Anion gap: 5 (ref 5–15)
BUN: 17 mg/dL (ref 8–23)
CO2: 30 mmol/L (ref 22–32)
Calcium: 9.3 mg/dL (ref 8.9–10.3)
Chloride: 103 mmol/L (ref 98–111)
Creatinine: 0.97 mg/dL (ref 0.44–1.00)
GFR, Estimated: >60 mL/min (ref >60)
Glucose, Bld: 93 mg/dL (ref 70–99)
Potassium: 4.3 mmol/L (ref 3.5–5.1)
Sodium: 138 mmol/L (ref 135–145)
Total Bilirubin: 0.4 mg/dL (ref 0.3–1.2)
Total Protein: 6 g/dL — ABNORMAL LOW (ref 6.5–8.1)

## 2022-12-01 NOTE — Progress Notes (Signed)
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  PRIOR THERAPY: 1) Right VATS with right upper lobectomy with bronchoplasty closure and en bloc resection of a wedge from the superior segment of the right lower lobe in addition to mediastinal lymph node dissection on 07/30/2015 under the care of Dr. Dorris Fetch. 2) Adjuvant systemic chemotherapy with cisplatin 75 MG/M2 and Alimta 500 MG/M2 every 3 weeks. First dose was given on 09/10/2015. Status post 4 cycles. 3) adjuvant radiotherapy under the care of Dr. Kathrynn Running.  CURRENT THERAPY: Tagrisso 80 mg p.o. daily started 12/01/2018.  Status post 45 months of treatment.  INTERVAL HISTORY: [name] West 72 y.o. female returns to the clinic today for follow-up visit.  The patient is feeling fine today with no concerning complaints.  Her hot flashes has significantly improved.  She denied having any current chest pain, shortness of breath, cough or hemoptysis.  She has no nausea, vomiting, diarrhea or constipation.  She has no headache or visual changes.  She is here today for evaluation and repeat blood work.  MEDICAL HISTORY: Past Medical History:  Diagnosis Date   Anxiety    Arthritis    shoulders   Cancer ([facility]) 06/2014   Lung Cancer   Complication of anesthesia    nausea, does well with PROPOFOL    Cough 09/17/2015   Encounter for antineoplastic chemotherapy 10/22/2015   Environmental and seasonal allergies    Family history of adverse reaction to anesthesia    Patients grandmother died while having hand surgery; pt  unsure of cause   Oral thrush 10/22/2015   Ovarian cyst    PONV (postoperative nausea and vomiting)    Raynaud disease     ALLERGIES:  is allergic to avelox [moxifloxacin hcl in nacl], cephalexin, cephalosporins, ciprofloxacin hcl, clindamycin/lincomycin, shrimp [shellfish allergy], macrobid [nitrofurantoin monohyd macro], omnipaque [iohexol], azithromycin, fluconazole, moxifloxacin, adhesive [tape], lidoderm [lidocaine], pseudoephedrine, and sulfa antibiotics.  MEDICATIONS:  Current Outpatient Medications  Medication Sig Dispense Refill   calcium carbonate (OSCAL) 1500 (600 Ca) MG TABS tablet Take 1,500 mg by mouth 2 (two) times daily with a meal.     cholecalciferol (VITAMIN D) 1000 UNITS tablet Take 1,000 Units by mouth 3 (three) times a week.     citalopram (CELEXA) 10 MG tablet Take 10 mg by mouth daily.     clobetasol ointment (TEMOVATE) 0.05 % 1 application     Clobetasol Prop Oint-Coal Tar (CLOBETAPLUS OINTMENT EX) Apply 1 Dose topically daily.     EPINEPHrine 0.3 mg/0.3 mL IJ SOAJ injection Inject into the muscle as directed.     estradiol (ESTRACE) 0.1 MG/GM vaginal cream See admin instructions.     ibuprofen (ADVIL,MOTRIN) 200 MG tablet Take 200 mg by mouth every 6 (six) hours as needed (pain).     levocetirizine (XYZAL) 5 MG tablet Take 2.5 mg by mouth every evening.     LORazepam (ATIVAN) 0.5 MG tablet Take 0.5 mg by mouth at bedtime as needed for anxiety or sleep.      Multiple Vitamin (MULTIVITAMIN WITH MINERALS) TABS Take 1 tablet by mouth daily.     NONFORMULARY OR  COMPOUNDED ITEM Place 1 application vaginally 2 (two) times a week. Estradiol 0.02% Cream     osimertinib mesylate (TAGRISSO) 80 MG tablet TAKE 1 TABLET (80 MG TOTAL) BY MOUTH DAILY. 60 tablet 2   polyethylene glycol powder (GLYCOLAX/MIRALAX) 17 GM/SCOOP powder See admin instructions.     predniSONE (DELTASONE) 50 MG tablet TAKE 1 TABLET BY MOUTH 13 HOURS PRIOR TO CT, 1 TABLET 7 HOURS PRIOR & 1 TABLET 1 HOUR PRIOR  TO CT 3 tablet 3   Probiotic Product (ALIGN) 4 MG CAPS Take 1 capsule by mouth daily.     rosuvastatin (CRESTOR) 5 MG tablet Take 1 tablet (5 mg total) by mouth daily. 90 tablet 3   Wheat Dextrin (BENEFIBER) POWD See admin instructions.     No current facility-administered medications for this visit.    SURGICAL HISTORY:  Past Surgical History:  Procedure Laterality Date   BREAST EXCISIONAL BIOPSY Left    1996   CESAREAN SECTION  1984   COLONOSCOPY WITH PROPOFOL N/A 09/27/2012   Procedure: COLONOSCOPY WITH PROPOFOL;  Surgeon: Charolett Bumpers, MD;  Location: [facility];  Service: Endoscopy;  Laterality: N/A;   Fibroid Removed     LAPAROTOMY Left 07/20/2016   Procedure: MINI LAPAROTOMY, REMOVAL OF LEFT OVARIAN CYST, LEFT SALPINGO-OOPHORECTOMY;  Surgeon: Marcelle Overlie, MD;  Location: [facility];  Service: Gynecology;  Laterality: Left;  please moved to 7:30am if spot opens   Right Rotator Cuff Right    TOTAL SHOULDER ARTHROPLASTY Left 06/14/2018   Procedure: TOTAL SHOULDER ARTHROPLASTY;  Surgeon: Francena Hanly, MD;  Location: [facility];  Service: Orthopedics;  Laterality: Left;    VIDEO ASSISTED THORACOSCOPY (VATS)/ LOBECTOMY Right 07/30/2015   Procedure: RIGHT VIDEO ASSISTED THORACOSCOPY (VATS)/RIGHT UPPER LOBECTOMY;  Surgeon: Loreli Slot, MD;  Location: [facility];  Service: Thoracic;  Laterality: Right;   VIDEO BRONCHOSCOPY WITH ENDOBRONCHIAL NAVIGATION N/A 07/15/2015   Procedure: VIDEO BRONCHOSCOPY WITH ENDOBRONCHIAL NAVIGATION with Biopsy;  Surgeon: Leslye Peer, MD;  Location: [facility];  Service: Thoracic;  Laterality: N/A;    REVIEW OF SYSTEMS:  A comprehensive review of systems was negative.   PHYSICAL EXAMINATION: General appearance: alert, cooperative, and no distress Head: Normocephalic, without obvious abnormality, atraumatic Neck: no adenopathy, no JVD, supple, symmetrical, trachea midline, and thyroid not enlarged, symmetric, no tenderness/mass/nodules Lymph nodes:  Cervical, supraclavicular, and axillary nodes normal. Resp: clear to auscultation bilaterally Back: symmetric, no curvature. ROM normal. No CVA tenderness. Cardio: regular rate and rhythm, S1, S2 normal, no murmur, click, rub or gallop GI: soft, non-tender; bowel sounds normal; no masses,  no organomegaly Extremities: extremities normal, atraumatic, no cyanosis or edema  ECOG PERFORMANCE STATUS: 0 - Asymptomatic  Blood pressure (!) 105/58, pulse 74, temperature (!) 97.5 F (36.4 C), temperature source Oral, resp. rate 16, height 4\' 11"  (1.499 m), weight 90 lb 11.2 oz (41.1 kg), SpO2 100 %.   LABORATORY DATA: Lab Results  Component Value Date   WBC 5.8 12/01/2022   HGB 13.0 12/01/2022   HCT 39.2 12/01/2022   MCV 93.8 12/01/2022   PLT 173 12/01/2022      Chemistry      Component Value Date/Time   NA 138 12/01/2022 0856   NA 139 06/16/2017 1132   K 4.3 12/01/2022 0856   K 4.7 06/16/2017 1132   CL 103 12/01/2022 0856   CO2 30 12/01/2022 0856   CO2 28 06/16/2017 1132   BUN 17 12/01/2022 0856   BUN 19.5 06/16/2017 1132   CREATININE  0.97 12/01/2022 0856   CREATININE 0.8 06/16/2017 1132      Component Value Date/Time   CALCIUM 9.3 12/01/2022 0856   CALCIUM 10.0 06/16/2017 1132   ALKPHOS 45 12/01/2022 0856   ALKPHOS 68 06/16/2017 1132   AST 23 12/01/2022 0856   AST 21 06/16/2017 1132   ALT 17 12/01/2022 0856   ALT 18 06/16/2017 1132   BILITOT 0.4 12/01/2022 0856   BILITOT 0.50 06/16/2017 1132       RADIOGRAPHIC STUDIES: No results found.  ASSESSMENT AND PLAN:  This is a very pleasant 72 years old white female with recurrent non-small cell lung cancer initially diagnosed as stage IIIA non-small cell lung cancer, adenocarcinoma with positive EGFR mutation in exon 19 status post right upper lobectomy with lymph node dissection followed by adjuvant systemic chemotherapy as well as adjuvant radiation. The patient has been on observation for almost 3 years.   Unfortunately  her scan in June 2020 showed multiple tiny pulmonary nodules scattered throughout both lungs which many of them have increased in size and some new nodule suspected the previous scan.  The patient started treatment with Tagrisso 80 mg p.o. daily status post 48 months of treatment.   The patient has been tolerating this treatment well with no concerning adverse effects. Her blood work is unremarkable today. I recommended for her to continue her current treatment with Tagrisso with the same dose. I will see her back for follow-up visit in 3 months with repeat CT scan of the chest, abdomen and pelvis for restaging of her disease. She was advised to call immediately if she has any other concerning symptoms in the interval. The patient voices understanding of current disease status and treatment options and is in agreement with the current care plan. All questions were answered. The patient knows to call the clinic with any problems, questions or concerns. We can certainly see the patient much sooner if necessary. The total time spent in the appointment was 20 minutes.  Disclaimer: This note was dictated with voice recognition software. Similar sounding words can inadvertently be transcribed and may not be corrected upon review.

## 2022-12-02 ENCOUNTER — Other Ambulatory Visit (HOSPITAL_COMMUNITY): Payer: Self-pay

## 2022-12-28 ENCOUNTER — Other Ambulatory Visit (HOSPITAL_COMMUNITY): Payer: Self-pay

## 2023-01-02 DIAGNOSIS — Z111 Encounter for screening for respiratory tuberculosis: Secondary | ICD-10-CM | POA: Diagnosis not present

## 2023-01-02 DIAGNOSIS — K648 Other hemorrhoids: Secondary | ICD-10-CM | POA: Diagnosis not present

## 2023-01-02 DIAGNOSIS — Z681 Body mass index (BMI) 19 or less, adult: Secondary | ICD-10-CM | POA: Diagnosis not present

## 2023-01-03 ENCOUNTER — Other Ambulatory Visit (HOSPITAL_COMMUNITY): Payer: Self-pay

## 2023-01-17 ENCOUNTER — Other Ambulatory Visit: Payer: Self-pay | Admitting: Internal Medicine

## 2023-01-17 DIAGNOSIS — N952 Postmenopausal atrophic vaginitis: Secondary | ICD-10-CM | POA: Diagnosis not present

## 2023-01-17 DIAGNOSIS — E46 Unspecified protein-calorie malnutrition: Secondary | ICD-10-CM | POA: Diagnosis not present

## 2023-01-17 DIAGNOSIS — R350 Frequency of micturition: Secondary | ICD-10-CM | POA: Diagnosis not present

## 2023-01-17 DIAGNOSIS — Z681 Body mass index (BMI) 19 or less, adult: Secondary | ICD-10-CM | POA: Diagnosis not present

## 2023-01-23 DIAGNOSIS — L293 Anogenital pruritus, unspecified: Secondary | ICD-10-CM | POA: Diagnosis not present

## 2023-01-23 DIAGNOSIS — N362 Urethral caruncle: Secondary | ICD-10-CM | POA: Diagnosis not present

## 2023-01-23 DIAGNOSIS — R3915 Urgency of urination: Secondary | ICD-10-CM | POA: Diagnosis not present

## 2023-01-23 DIAGNOSIS — Z681 Body mass index (BMI) 19 or less, adult: Secondary | ICD-10-CM | POA: Diagnosis not present

## 2023-01-26 ENCOUNTER — Other Ambulatory Visit: Payer: Self-pay | Admitting: Family Medicine

## 2023-01-26 ENCOUNTER — Other Ambulatory Visit (HOSPITAL_COMMUNITY): Payer: Self-pay

## 2023-01-26 ENCOUNTER — Other Ambulatory Visit: Payer: Self-pay

## 2023-01-26 DIAGNOSIS — Z1231 Encounter for screening mammogram for malignant neoplasm of breast: Secondary | ICD-10-CM

## 2023-01-31 ENCOUNTER — Other Ambulatory Visit (HOSPITAL_COMMUNITY): Payer: Self-pay

## 2023-02-02 DIAGNOSIS — K136 Irritative hyperplasia of oral mucosa: Secondary | ICD-10-CM | POA: Diagnosis not present

## 2023-02-02 DIAGNOSIS — Z681 Body mass index (BMI) 19 or less, adult: Secondary | ICD-10-CM | POA: Diagnosis not present

## 2023-02-15 DIAGNOSIS — H04123 Dry eye syndrome of bilateral lacrimal glands: Secondary | ICD-10-CM | POA: Diagnosis not present

## 2023-02-15 DIAGNOSIS — H5213 Myopia, bilateral: Secondary | ICD-10-CM | POA: Diagnosis not present

## 2023-02-15 DIAGNOSIS — H2513 Age-related nuclear cataract, bilateral: Secondary | ICD-10-CM | POA: Diagnosis not present

## 2023-02-16 ENCOUNTER — Encounter: Payer: Self-pay | Admitting: Internal Medicine

## 2023-02-20 ENCOUNTER — Other Ambulatory Visit: Payer: Self-pay | Admitting: Medical Genetics

## 2023-02-20 DIAGNOSIS — Z006 Encounter for examination for normal comparison and control in clinical research program: Secondary | ICD-10-CM

## 2023-02-24 ENCOUNTER — Other Ambulatory Visit (HOSPITAL_COMMUNITY): Payer: Self-pay

## 2023-02-27 ENCOUNTER — Other Ambulatory Visit: Payer: Self-pay | Admitting: Internal Medicine

## 2023-02-27 ENCOUNTER — Ambulatory Visit (HOSPITAL_COMMUNITY)
Admission: RE | Admit: 2023-02-27 | Discharge: 2023-02-27 | Disposition: A | Discharge status: Home / Self Care | Payer: Medicare Other | Source: Ambulatory Visit | Attending: Internal Medicine | Admitting: Internal Medicine

## 2023-02-27 ENCOUNTER — Encounter (HOSPITAL_COMMUNITY): Payer: Self-pay

## 2023-02-27 ENCOUNTER — Inpatient Hospital Stay: Payer: Medicare Other | Attending: Internal Medicine

## 2023-02-27 DIAGNOSIS — Z79899 Other long term (current) drug therapy: Secondary | ICD-10-CM | POA: Insufficient documentation

## 2023-02-27 DIAGNOSIS — C3490 Malignant neoplasm of unspecified part of unspecified bronchus or lung: Secondary | ICD-10-CM

## 2023-02-27 DIAGNOSIS — C3411 Malignant neoplasm of upper lobe, right bronchus or lung: Secondary | ICD-10-CM | POA: Diagnosis not present

## 2023-02-27 DIAGNOSIS — Z902 Acquired absence of lung [part of]: Secondary | ICD-10-CM | POA: Insufficient documentation

## 2023-02-27 DIAGNOSIS — J479 Bronchiectasis, uncomplicated: Secondary | ICD-10-CM | POA: Diagnosis not present

## 2023-02-27 DIAGNOSIS — I700 Atherosclerosis of aorta: Secondary | ICD-10-CM | POA: Diagnosis not present

## 2023-02-27 LAB — CBC WITH DIFFERENTIAL (CANCER CENTER ONLY)
Abs Immature Granulocytes: 0.02 10*3/uL (ref 0.00–0.07)
Basophils Absolute: 0 10*3/uL (ref 0.0–0.1)
Basophils Relative: 0 %
Eosinophils Absolute: 0 10*3/uL (ref 0.0–0.5)
Eosinophils Relative: 0 %
HCT: 41.6 % (ref 36.0–46.0)
Hemoglobin: 13.8 g/dL (ref 12.0–15.0)
Immature Granulocytes: 0 %
Lymphocytes Relative: 11 %
Lymphs Abs: 0.6 10*3/uL — ABNORMAL LOW (ref 0.7–4.0)
MCH: 30.4 pg (ref 26.0–34.0)
MCHC: 33.2 g/dL (ref 30.0–36.0)
MCV: 91.6 fL (ref 80.0–100.0)
Monocytes Absolute: 0.1 10*3/uL (ref 0.1–1.0)
Monocytes Relative: 2 %
Neutro Abs: 4.7 10*3/uL (ref 1.7–7.7)
Neutrophils Relative %: 87 %
Platelet Count: 191 10*3/uL (ref 150–400)
RBC: 4.54 MIL/uL (ref 3.87–5.11)
RDW: 13 % (ref 11.5–15.5)
WBC Count: 5.4 10*3/uL (ref 4.0–10.5)
nRBC: 0 % (ref 0.0–0.2)

## 2023-02-27 LAB — CMP (CANCER CENTER ONLY)
ALT: 19 U/L (ref 0–44)
AST: 25 U/L (ref 15–41)
Albumin: 4.5 g/dL (ref 3.5–5.0)
Alkaline Phosphatase: 55 U/L (ref 38–126)
Anion gap: 7 (ref 5–15)
BUN: 24 mg/dL — ABNORMAL HIGH (ref 8–23)
CO2: 26 mmol/L (ref 22–32)
Calcium: 9.3 mg/dL (ref 8.9–10.3)
Chloride: 101 mmol/L (ref 98–111)
Creatinine: 0.95 mg/dL (ref 0.44–1.00)
GFR, Estimated: >60 mL/min (ref >60)
Glucose, Bld: 237 mg/dL — ABNORMAL HIGH (ref 70–99)
Potassium: 4.2 mmol/L (ref 3.5–5.1)
Sodium: 134 mmol/L — ABNORMAL LOW (ref 135–145)
Total Bilirubin: 0.4 mg/dL (ref 0.3–1.2)
Total Protein: 6.8 g/dL (ref 6.5–8.1)

## 2023-02-27 MED ORDER — IOHEXOL 300 MG/ML  SOLN
80.0000 mL | Freq: Once | INTRAMUSCULAR | Status: AC | PRN
  Administered 2023-02-27: 80 mL via INTRAVENOUS

## 2023-02-27 MED ORDER — BARIUM SULFATE 2 % PO SUSP
900.0000 mL | Freq: Once | ORAL | Status: AC
  Administered 2023-02-27: 900 mL via ORAL

## 2023-02-27 MED ORDER — SODIUM CHLORIDE (PF) 0.9 % IJ SOLN
INTRAMUSCULAR | Status: AC
  Filled 2023-02-27: qty 50

## 2023-03-02 ENCOUNTER — Encounter: Payer: Self-pay | Admitting: Internal Medicine

## 2023-03-02 ENCOUNTER — Inpatient Hospital Stay (HOSPITAL_BASED_OUTPATIENT_CLINIC_OR_DEPARTMENT_OTHER): Payer: Medicare Other | Admitting: Internal Medicine

## 2023-03-02 VITALS — BP 134/74 | HR 82 | Temp 97.8°F | Resp 16 | Ht 59.0 in | Wt 91.9 lb

## 2023-03-02 DIAGNOSIS — Z902 Acquired absence of lung [part of]: Secondary | ICD-10-CM | POA: Diagnosis not present

## 2023-03-02 DIAGNOSIS — C3411 Malignant neoplasm of upper lobe, right bronchus or lung: Secondary | ICD-10-CM | POA: Diagnosis not present

## 2023-03-02 DIAGNOSIS — Z79899 Other long term (current) drug therapy: Secondary | ICD-10-CM | POA: Diagnosis not present

## 2023-03-02 DIAGNOSIS — C3491 Malignant neoplasm of unspecified part of right bronchus or lung: Secondary | ICD-10-CM

## 2023-03-02 NOTE — Progress Notes (Signed)
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  PRIOR THERAPY: 1) Right VATS with right upper lobectomy with bronchoplasty closure and en bloc resection of a wedge from the superior segment of the right lower lobe in addition to mediastinal lymph node dissection on 07/30/2015 under the care of Dr. Dorris Fetch. 2) Adjuvant systemic chemotherapy with cisplatin 75 MG/M2 and Alimta 500 MG/M2 every 3 weeks. First dose was given on 09/10/2015. Status post 4 cycles. 3) adjuvant radiotherapy under the care of Dr. Kathrynn Running.  CURRENT THERAPY: Tagrisso 80 mg p.o. daily started 12/01/2018.  Status post 51 months of treatment.  INTERVAL HISTORY: [name] West 72 y.o. female  Discussed the use of AI scribe software for clinical note transcription with the patient, who gave verbal consent to proceed.  History of Present Illness   The patient, with a history of lung cancer, underwent a recent scan three days prior to the consultation. She report no new symptoms or complaints, specifically denying chest pain, breathing issues, nausea, vomiting, or rash. She has been on Tagrisso for fifty one months, with the only reported side effect being brittle nails that tend to break off. The patient remains physically active, participating in a 5K event for lung cancer awareness. She expresses anxiety about the results of her recent scan and the potential need for new treatments, but overall, she appears to be managing their condition well with the current regimen.       MEDICAL HISTORY: Past  Medical History:  Diagnosis Date   Anxiety    Arthritis    shoulders   Cancer (HCC) 06/2014   Lung Cancer   Complication of anesthesia    nausea, does well with PROPOFOL    Cough 09/17/2015   Encounter for antineoplastic chemotherapy 10/22/2015   Environmental and seasonal allergies    Family history of adverse reaction to anesthesia    Patients grandmother died while having hand surgery; pt unsure of cause   Oral thrush 10/22/2015   Ovarian cyst    PONV (postoperative nausea and vomiting)    Raynaud disease     ALLERGIES:  is allergic to avelox [moxifloxacin hcl in nacl], cephalexin, cephalosporins, ciprofloxacin hcl, clindamycin/lincomycin, shrimp [shellfish allergy], macrobid [nitrofurantoin monohyd macro], omnipaque [iohexol], azithromycin, fluconazole, moxifloxacin, adhesive [tape], lidoderm [lidocaine], pseudoephedrine, and sulfa antibiotics.  MEDICATIONS:  Current Outpatient Medications  Medication Sig Dispense Refill   calcium carbonate (OSCAL) 1500 (600 Ca) MG TABS tablet Take 1,500 mg by mouth 2 (two) times daily with a meal.     cholecalciferol (VITAMIN D) 1000 UNITS tablet Take 1,000 Units by mouth 3 (three) times a week.     citalopram (CELEXA) 10 MG tablet Take 10 mg by mouth daily.     clobetasol ointment (TEMOVATE) 0.05 % 1 application     Clobetasol Prop Oint-Coal Tar (CLOBETAPLUS OINTMENT EX) Apply 1 Dose topically daily.     EPINEPHrine 0.3 mg/0.3 mL IJ SOAJ injection Inject into the muscle as directed.     estradiol (ESTRACE) 0.1 MG/GM vaginal cream See admin instructions.     ibuprofen (ADVIL,MOTRIN) 200 MG tablet Take 200 mg by mouth  every 6 (six) hours as needed (pain).     levocetirizine (XYZAL) 5 MG tablet Take 2.5 mg by mouth every evening.     LORazepam (ATIVAN) 0.5 MG tablet Take 0.5 mg by mouth at bedtime as needed for anxiety or sleep.      Multiple Vitamin (MULTIVITAMIN WITH MINERALS) TABS Take 1 tablet by mouth daily.     NONFORMULARY OR COMPOUNDED ITEM  Place 1 application vaginally 2 (two) times a week. Estradiol 0.02% Cream     osimertinib mesylate (TAGRISSO) 80 MG tablet TAKE 1 TABLET (80 MG TOTAL) BY MOUTH DAILY. 60 tablet 2   polyethylene glycol powder (GLYCOLAX/MIRALAX) 17 GM/SCOOP powder See admin instructions.     predniSONE (DELTASONE) 50 MG tablet TAKE 1 TABLET BY MOUTH 13 HOURS PRIOR TO CT, 1 TABLET 7 HOURS PRIOR & 1 TABLET 1 HOUR PRIOR TO CT 3 tablet 3   Probiotic Product (ALIGN) 4 MG CAPS Take 1 capsule by mouth daily.     rosuvastatin (CRESTOR) 5 MG tablet TAKE 1 TABLET (5 MG TOTAL) BY MOUTH DAILY. 90 tablet 3   Wheat Dextrin (BENEFIBER) POWD See admin instructions.     No current facility-administered medications for this visit.    SURGICAL HISTORY:  Past Surgical History:  Procedure Laterality Date   BREAST EXCISIONAL BIOPSY Left    1996   CESAREAN SECTION  1984   COLONOSCOPY WITH PROPOFOL N/A 09/27/2012   Procedure: COLONOSCOPY WITH PROPOFOL;  Surgeon: Charolett Bumpers, MD;  Location: [facility];  Service: Endoscopy;  Laterality: N/A;   Fibroid Removed     LAPAROTOMY Left 07/20/2016   Procedure: MINI LAPAROTOMY, REMOVAL OF LEFT OVARIAN CYST, LEFT SALPINGO-OOPHORECTOMY;  Surgeon: Marcelle Overlie, MD;  Location: [facility];  Service: Gynecology;  Laterality: Left;  please moved to 7:30am if spot opens   Right Rotator Cuff Right    TOTAL SHOULDER ARTHROPLASTY Left 06/14/2018   Procedure: TOTAL SHOULDER ARTHROPLASTY;  Surgeon: Francena Hanly, MD;  Location: [facility];  Service: Orthopedics;  Laterality: Left;    VIDEO ASSISTED THORACOSCOPY (VATS)/ LOBECTOMY Right 07/30/2015   Procedure: RIGHT VIDEO ASSISTED THORACOSCOPY (VATS)/RIGHT UPPER LOBECTOMY;  Surgeon: Loreli Slot, MD;  Location: [facility];  Service: Thoracic;  Laterality: Right;   VIDEO BRONCHOSCOPY WITH ENDOBRONCHIAL NAVIGATION N/A 07/15/2015   Procedure: VIDEO BRONCHOSCOPY WITH ENDOBRONCHIAL NAVIGATION with Biopsy;  Surgeon: Leslye Peer, MD;  Location: [facility];   Service: Thoracic;  Laterality: N/A;    REVIEW OF SYSTEMS:  Constitutional: negative Eyes: negative Ears, nose, mouth, throat, and face: negative Respiratory: negative Cardiovascular: negative Gastrointestinal: negative Genitourinary:negative Integument/breast: negative Hematologic/lymphatic: negative Musculoskeletal:negative Neurological: negative Behavioral/Psych: negative Endocrine: negative Allergic/Immunologic: negative   PHYSICAL EXAMINATION: General appearance: alert, cooperative, and no distress Head: Normocephalic, without obvious abnormality, atraumatic Neck: no adenopathy, no JVD, supple, symmetrical, trachea midline, and thyroid not enlarged, symmetric, no tenderness/mass/nodules Lymph nodes: Cervical, supraclavicular, and axillary nodes normal. Resp: clear to auscultation bilaterally Back: symmetric, no curvature. ROM normal. No CVA tenderness. Cardio: regular rate and rhythm, S1, S2 normal, no murmur, click, rub or gallop GI: soft, non-tender; bowel sounds normal; no masses,  no organomegaly Extremities: extremities normal, atraumatic, no cyanosis or edema Neurologic: Alert and oriented X 3, normal strength and tone. Normal symmetric reflexes. Normal coordination and gait  ECOG PERFORMANCE STATUS: 0 - Asymptomatic  Blood pressure 134/74, pulse 82, temperature 97.8 F (36.6 C), temperature source Oral, resp. rate 16, height 4\' 11"  (1.499 m), weight 91 lb 14.4 oz (41.7 kg), SpO2 100%.  LABORATORY DATA: Lab Results  Component Value Date   WBC 5.4 02/27/2023   HGB 13.8 02/27/2023   HCT 41.6 02/27/2023   MCV 91.6 02/27/2023   PLT 191 02/27/2023      Chemistry      Component Value Date/Time   NA 134 (L) 02/27/2023 0929   NA 139 06/16/2017 1132   K 4.2 02/27/2023 0929   K 4.7 06/16/2017 1132   CL 101 02/27/2023 0929   CO2 26 02/27/2023 0929   CO2 28 06/16/2017 1132   BUN 24 (H) 02/27/2023 0929   BUN 19.5 06/16/2017 1132   CREATININE 0.95 02/27/2023 0929    CREATININE 0.8 06/16/2017 1132      Component Value Date/Time   CALCIUM 9.3 02/27/2023 0929   CALCIUM 10.0 06/16/2017 1132   ALKPHOS 55 02/27/2023 0929   ALKPHOS 68 06/16/2017 1132   AST 25 02/27/2023 0929   AST 21 06/16/2017 1132   ALT 19 02/27/2023 0929   ALT 18 06/16/2017 1132   BILITOT 0.4 02/27/2023 0929   BILITOT 0.50 06/16/2017 1132       RADIOGRAPHIC STUDIES: No results found.  ASSESSMENT AND PLAN:  This is a very pleasant 72 years old white female with recurrent non-small cell lung cancer initially diagnosed as stage IIIA non-small cell lung cancer, adenocarcinoma with positive EGFR mutation in exon 19 status post right upper lobectomy with lymph node dissection followed by adjuvant systemic chemotherapy as well as adjuvant radiation. The patient has been on observation for almost 3 years.   Unfortunately her scan in June 2020 showed multiple tiny pulmonary nodules scattered throughout both lungs which many of them have increased in size and some new nodule suspected the previous scan.  The patient started treatment with Tagrisso 80 mg p.o. daily status post 51 months of treatment.      Lung Cancer Stable disease on Tagrisso for 51 months. No new symptoms or side effects, except for brittle nails. Recent scan shows no new spots and old spots are stable in size. Awaiting official radiology report for confirmation. -Continue Tagrisso. -Obtain official radiology report for recent scan. -Plan for imaging every six months if scan is good.  General Health Maintenance Patient is actively participating in a 5K event for lung cancer awareness. -Encourage continued participation in health maintenance activities.     The patient voices understanding of current disease status and treatment options and is in agreement with the current care plan. All questions were answered. The patient knows to call the clinic with any problems, questions or concerns. We can certainly see the  patient much sooner if necessary. The total time spent in the appointment was 30 minutes.  Disclaimer: This note was dictated with voice recognition software. Similar sounding words can inadvertently be transcribed and may not be corrected upon review.

## 2023-03-06 ENCOUNTER — Encounter: Payer: Self-pay | Admitting: Internal Medicine

## 2023-03-06 ENCOUNTER — Telehealth: Payer: Self-pay | Admitting: Internal Medicine

## 2023-03-06 NOTE — Telephone Encounter (Signed)
Scheduled per patient's request, patient has been called and notified.

## 2023-03-16 ENCOUNTER — Ambulatory Visit: Payer: Medicare Other

## 2023-03-22 DIAGNOSIS — Z23 Encounter for immunization: Secondary | ICD-10-CM | POA: Diagnosis not present

## 2023-03-28 ENCOUNTER — Ambulatory Visit
Admission: RE | Admit: 2023-03-28 | Discharge: 2023-03-28 | Disposition: A | Discharge status: Home / Self Care | Payer: Medicare Other | Source: Ambulatory Visit | Attending: Family Medicine | Admitting: Family Medicine

## 2023-03-28 DIAGNOSIS — Z1231 Encounter for screening mammogram for malignant neoplasm of breast: Secondary | ICD-10-CM

## 2023-03-29 ENCOUNTER — Other Ambulatory Visit: Payer: Self-pay

## 2023-03-29 NOTE — Progress Notes (Signed)
Specialty Pharmacy Refill Coordination Note  [name] West is a 72 y.o. female contacted today regarding refills of specialty medication(s) Osimertinib Mesylate   Patient requested Delivery   Delivery date: 04/04/23   Verified address: [address]  [name] [address]   Medication will be filled on 04/03/23.

## 2023-04-03 ENCOUNTER — Other Ambulatory Visit: Payer: Self-pay

## 2023-04-05 DIAGNOSIS — Z23 Encounter for immunization: Secondary | ICD-10-CM | POA: Diagnosis not present

## 2023-04-20 ENCOUNTER — Other Ambulatory Visit: Payer: Self-pay

## 2023-04-20 ENCOUNTER — Other Ambulatory Visit (HOSPITAL_COMMUNITY): Payer: Self-pay

## 2023-04-20 ENCOUNTER — Other Ambulatory Visit: Payer: Self-pay | Admitting: Internal Medicine

## 2023-04-20 DIAGNOSIS — C3491 Malignant neoplasm of unspecified part of right bronchus or lung: Secondary | ICD-10-CM

## 2023-04-20 NOTE — Progress Notes (Signed)
Specialty Pharmacy Refill Coordination Note  [name] West is a 72 y.o. female contacted today regarding refills of specialty medication(s) Osimertinib Mesylate   Patient requested Delivery   Delivery date: 05/03/23   Verified address: [address]   [place] [address]   Medication will be filled on 05/02/23 pending a refill request.

## 2023-04-21 ENCOUNTER — Other Ambulatory Visit: Payer: Self-pay

## 2023-04-21 MED ORDER — OSIMERTINIB MESYLATE 80 MG PO TABS
ORAL_TABLET | Freq: Every day | ORAL | 2 refills | Status: DC
  Filled 2023-04-21: qty 30, 30d supply, fill #0
  Filled 2023-05-18: qty 30, 30d supply, fill #1
  Filled 2023-06-27: qty 30, 30d supply, fill #2
  Filled 2023-07-28: qty 30, 30d supply, fill #3
  Filled 2023-08-24: qty 30, 30d supply, fill #4
  Filled 2023-09-19 (×2): qty 30, 30d supply, fill #5

## 2023-04-24 ENCOUNTER — Encounter (HOSPITAL_BASED_OUTPATIENT_CLINIC_OR_DEPARTMENT_OTHER): Payer: Self-pay | Admitting: Internal Medicine

## 2023-04-24 ENCOUNTER — Other Ambulatory Visit (HOSPITAL_COMMUNITY)
Admission: RE | Admit: 2023-04-24 | Discharge: 2023-04-24 | Disposition: A | Discharge status: Home / Self Care | Payer: Medicare Other | Source: Ambulatory Visit | Attending: Oncology | Admitting: Oncology

## 2023-04-24 DIAGNOSIS — Z006 Encounter for examination for normal comparison and control in clinical research program: Secondary | ICD-10-CM | POA: Insufficient documentation

## 2023-04-24 NOTE — Telephone Encounter (Signed)
Spoke to patient and appt made with provider

## 2023-05-02 ENCOUNTER — Other Ambulatory Visit: Payer: Self-pay

## 2023-05-02 LAB — HELIX MOLECULAR SCREEN: Genetic Analysis Overall Interpretation: NEGATIVE

## 2023-05-02 LAB — GENECONNECT MOLECULAR SCREEN

## 2023-05-18 ENCOUNTER — Other Ambulatory Visit: Payer: Self-pay

## 2023-05-18 ENCOUNTER — Other Ambulatory Visit (HOSPITAL_COMMUNITY): Payer: Self-pay

## 2023-05-18 NOTE — Progress Notes (Signed)
Specialty Pharmacy Refill Coordination Note  [name] West is a 72 y.o. female contacted today regarding refills of specialty medication(s) Osimertinib Mesylate   Patient requested Delivery   Delivery date: 05/30/23   Verified address: [address]   [place] [address]   Medication will be filled on 05/29/23.

## 2023-05-29 ENCOUNTER — Inpatient Hospital Stay: Payer: Medicare Other | Attending: Internal Medicine

## 2023-05-29 ENCOUNTER — Inpatient Hospital Stay (HOSPITAL_BASED_OUTPATIENT_CLINIC_OR_DEPARTMENT_OTHER): Payer: Medicare Other | Admitting: Internal Medicine

## 2023-05-29 ENCOUNTER — Encounter: Payer: Self-pay | Admitting: Internal Medicine

## 2023-05-29 ENCOUNTER — Other Ambulatory Visit: Payer: Self-pay

## 2023-05-29 VITALS — BP 119/72 | HR 80 | Temp 97.9°F | Resp 16 | Ht 59.0 in | Wt 91.8 lb

## 2023-05-29 DIAGNOSIS — C3411 Malignant neoplasm of upper lobe, right bronchus or lung: Secondary | ICD-10-CM | POA: Diagnosis not present

## 2023-05-29 DIAGNOSIS — C3490 Malignant neoplasm of unspecified part of unspecified bronchus or lung: Secondary | ICD-10-CM | POA: Diagnosis not present

## 2023-05-29 DIAGNOSIS — N951 Menopausal and female climacteric states: Secondary | ICD-10-CM | POA: Insufficient documentation

## 2023-05-29 DIAGNOSIS — C3491 Malignant neoplasm of unspecified part of right bronchus or lung: Secondary | ICD-10-CM

## 2023-05-29 DIAGNOSIS — Z79899 Other long term (current) drug therapy: Secondary | ICD-10-CM | POA: Insufficient documentation

## 2023-05-29 LAB — CMP (CANCER CENTER ONLY)
ALT: 17 U/L (ref 0–44)
AST: 24 U/L (ref 15–41)
Albumin: 4.4 g/dL (ref 3.5–5.0)
Alkaline Phosphatase: 44 U/L (ref 38–126)
Anion gap: 3 — ABNORMAL LOW (ref 5–15)
BUN: 18 mg/dL (ref 8–23)
CO2: 32 mmol/L (ref 22–32)
Calcium: 9.6 mg/dL (ref 8.9–10.3)
Chloride: 102 mmol/L (ref 98–111)
Creatinine: 0.91 mg/dL (ref 0.44–1.00)
GFR, Estimated: >60 mL/min (ref >60)
Glucose, Bld: 88 mg/dL (ref 70–99)
Potassium: 4.7 mmol/L (ref 3.5–5.1)
Sodium: 137 mmol/L (ref 135–145)
Total Bilirubin: 0.4 mg/dL (ref <1.2)
Total Protein: 6.4 g/dL — ABNORMAL LOW (ref 6.5–8.1)

## 2023-05-29 LAB — CBC WITH DIFFERENTIAL (CANCER CENTER ONLY)
Abs Immature Granulocytes: 0.01 10*3/uL (ref 0.00–0.07)
Basophils Absolute: 0 10*3/uL (ref 0.0–0.1)
Basophils Relative: 1 %
Eosinophils Absolute: 0.1 10*3/uL (ref 0.0–0.5)
Eosinophils Relative: 1 %
HCT: 42.6 % (ref 36.0–46.0)
Hemoglobin: 13.8 g/dL (ref 12.0–15.0)
Immature Granulocytes: 0 %
Lymphocytes Relative: 23 %
Lymphs Abs: 1.3 10*3/uL (ref 0.7–4.0)
MCH: 30.4 pg (ref 26.0–34.0)
MCHC: 32.4 g/dL (ref 30.0–36.0)
MCV: 93.8 fL (ref 80.0–100.0)
Monocytes Absolute: 0.9 10*3/uL (ref 0.1–1.0)
Monocytes Relative: 16 %
Neutro Abs: 3.2 10*3/uL (ref 1.7–7.7)
Neutrophils Relative %: 59 %
Platelet Count: 182 10*3/uL (ref 150–400)
RBC: 4.54 MIL/uL (ref 3.87–5.11)
RDW: 13 % (ref 11.5–15.5)
WBC Count: 5.6 10*3/uL (ref 4.0–10.5)
nRBC: 0 % (ref 0.0–0.2)

## 2023-05-29 NOTE — Addendum Note (Signed)
Addended by: Charma Igo on: 05/29/2023 09:39 AM   Modules accepted: Orders

## 2023-05-29 NOTE — Progress Notes (Signed)
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  PRIOR THERAPY: 1) Right VATS with right upper lobectomy with bronchoplasty closure and en bloc resection of a wedge from the superior segment of the right lower lobe in addition to mediastinal lymph node dissection on 07/30/2015 under the care of Dr. Dorris Fetch. 2) Adjuvant systemic chemotherapy with cisplatin 75 MG/M2 and Alimta 500 MG/M2 every 3 weeks. First dose was given on 09/10/2015. Status post 4 cycles. 3) adjuvant radiotherapy under the care of Dr. Kathrynn Running.  CURRENT THERAPY: Tagrisso 80 mg p.o. daily started 12/01/2018.  Status post 51 months of treatment.  INTERVAL HISTORY: Annalie Rumford 72 y.o. female returns to the clinic today for 85-month follow-up visit.Discussed the use of AI scribe software for clinical note transcription with the patient, who gave verbal consent to proceed.  History of Present Illness   The patient, with a history of lung cancer, has been on Tagrisso for fifty-four months following a recurrence of the disease with bilateral pulmonary nodules. She reports no new complaints or side effects related to the lung cancer. She has been living in a new facility, which she describes as "fine," but notes that she has had to adjust to communal living.  The patient reports occasional night sweats, which she attributes to the heating system in her living facility. She has no control over the heat, which she describes as coming on and going. She has not noticed any changes in her condition or routine  that might explain the night sweats.  The patient is aware of recent information regarding Tagrisso and heart attacks, but she does not have any known heart conditions. She is under the care of a cardiologist for monitoring. She also mentions a recent approval of a new ADC for Tagrisso, but she is currently doing well on her current treatment regimen.        MEDICAL HISTORY: Past Medical History:  Diagnosis Date   Anxiety    Arthritis    shoulders   Cancer (HCC) 06/2014   Lung Cancer   Complication of anesthesia    nausea, does well with PROPOFOL    Cough 09/17/2015   Encounter for antineoplastic chemotherapy 10/22/2015   Environmental and seasonal allergies    Family history of adverse reaction to anesthesia    Patients grandmother died while having hand surgery; pt unsure of cause   Oral thrush 10/22/2015   Ovarian cyst    PONV (postoperative nausea and vomiting)    Raynaud disease     ALLERGIES:  is allergic to avelox [moxifloxacin hcl in nacl], cephalexin, cephalosporins, ciprofloxacin hcl, clindamycin/lincomycin, shrimp [shellfish allergy], macrobid [nitrofurantoin monohyd macro], omnipaque [iohexol], azithromycin, fluconazole, moxifloxacin, adhesive [tape], lidoderm [lidocaine], pseudoephedrine, and sulfa antibiotics.  MEDICATIONS:  Current Outpatient Medications  Medication Sig Dispense Refill   calcium carbonate (OSCAL) 1500 (600 Ca) MG TABS tablet Take 1,500 mg by mouth 2 (two) times daily with a meal.     cholecalciferol (VITAMIN D) 1000 UNITS tablet Take 1,000 Units by mouth 3 (three) times a week.     citalopram (CELEXA) 10 MG tablet Take 10 mg by mouth  daily.     clobetasol ointment (TEMOVATE) 0.05 % 1 application     Clobetasol Prop Oint-Coal Tar (CLOBETAPLUS OINTMENT EX) Apply 1 Dose topically daily.     EPINEPHrine 0.3 mg/0.3 mL IJ SOAJ injection Inject into the muscle as directed.     estradiol (ESTRACE) 0.1 MG/GM vaginal cream See admin instructions.      ibuprofen (ADVIL,MOTRIN) 200 MG tablet Take 200 mg by mouth every 6 (six) hours as needed (pain).     levocetirizine (XYZAL) 5 MG tablet Take 2.5 mg by mouth every evening.     LORazepam (ATIVAN) 0.5 MG tablet Take 0.5 mg by mouth at bedtime as needed for anxiety or sleep.      Multiple Vitamin (MULTIVITAMIN WITH MINERALS) TABS Take 1 tablet by mouth daily.     NONFORMULARY OR COMPOUNDED ITEM Place 1 application vaginally 2 (two) times a week. Estradiol 0.02% Cream     osimertinib mesylate (TAGRISSO) 80 MG tablet TAKE 1 TABLET (80 MG TOTAL) BY MOUTH DAILY. 60 tablet 2   polyethylene glycol powder (GLYCOLAX/MIRALAX) 17 GM/SCOOP powder See admin instructions.     predniSONE (DELTASONE) 50 MG tablet TAKE 1 TABLET BY MOUTH 13 HOURS PRIOR TO CT, 1 TABLET 7 HOURS PRIOR & 1 TABLET 1 HOUR PRIOR TO CT 3 tablet 3   Probiotic Product (ALIGN) 4 MG CAPS Take 1 capsule by mouth daily.     rosuvastatin (CRESTOR) 5 MG tablet TAKE 1 TABLET (5 MG TOTAL) BY MOUTH DAILY. 90 tablet 3   Wheat Dextrin (BENEFIBER) POWD See admin instructions.     No current facility-administered medications for this visit.    SURGICAL HISTORY:  Past Surgical History:  Procedure Laterality Date   BREAST EXCISIONAL BIOPSY Left    1996   CESAREAN SECTION  1984   COLONOSCOPY WITH PROPOFOL N/A 09/27/2012   Procedure: COLONOSCOPY WITH PROPOFOL;  Surgeon: Charolett Bumpers, MD;  Location: [facility];  Service: Endoscopy;  Laterality: N/A;   Fibroid Removed     LAPAROTOMY Left 07/20/2016   Procedure: MINI LAPAROTOMY, REMOVAL OF LEFT OVARIAN CYST, LEFT SALPINGO-OOPHORECTOMY;  Surgeon: Marcelle Overlie, MD;  Location: [facility];  Service: Gynecology;  Laterality: Left;  please moved to 7:30am if spot opens   Right Rotator Cuff Right    TOTAL SHOULDER ARTHROPLASTY Left 06/14/2018   Procedure: TOTAL SHOULDER ARTHROPLASTY;  Surgeon: Francena Hanly, MD;  Location: [facility];  Service: Orthopedics;  Laterality: Left;    VIDEO ASSISTED THORACOSCOPY  (VATS)/ LOBECTOMY Right 07/30/2015   Procedure: RIGHT VIDEO ASSISTED THORACOSCOPY (VATS)/RIGHT UPPER LOBECTOMY;  Surgeon: Loreli Slot, MD;  Location: [facility];  Service: Thoracic;  Laterality: Right;   VIDEO BRONCHOSCOPY WITH ENDOBRONCHIAL NAVIGATION N/A 07/15/2015   Procedure: VIDEO BRONCHOSCOPY WITH ENDOBRONCHIAL NAVIGATION with Biopsy;  Surgeon: Leslye Peer, MD;  Location: [facility];  Service: Thoracic;  Laterality: N/A;    REVIEW OF SYSTEMS:  A comprehensive review of systems was negative except for: Constitutional: positive for hot flashes    PHYSICAL EXAMINATION: General appearance: alert, cooperative, and no distress Head: Normocephalic, without obvious abnormality, atraumatic Neck: no adenopathy, no JVD, supple, symmetrical, trachea midline, and thyroid not enlarged, symmetric, no tenderness/mass/nodules Lymph nodes: Cervical, supraclavicular, and axillary nodes normal. Resp: clear to auscultation bilaterally Back: symmetric, no curvature. ROM normal. No CVA tenderness. Cardio: regular rate and rhythm, S1, S2 normal, no murmur, click, rub or gallop GI: soft, non-tender; bowel sounds normal; no masses,  no organomegaly Extremities: extremities normal, atraumatic, no cyanosis or  edema  ECOG PERFORMANCE STATUS: 0 - Asymptomatic  Blood pressure 119/72, pulse 80, temperature 97.9 F (36.6 C), temperature source Temporal, resp. rate 16, height 4\' 11"  (1.499 m), weight 91 lb 12.8 oz (41.6 kg), SpO2 100%.   LABORATORY DATA: Lab Results  Component Value Date   WBC 5.6 05/29/2023   HGB 13.8 05/29/2023   HCT 42.6 05/29/2023   MCV 93.8 05/29/2023   PLT 182 05/29/2023      Chemistry      Component Value Date/Time   NA 134 (L) 02/27/2023 0929   NA 139 06/16/2017 1132   K 4.2 02/27/2023 0929   K 4.7 06/16/2017 1132   CL 101 02/27/2023 0929   CO2 26 02/27/2023 0929   CO2 28 06/16/2017 1132   BUN 24 (H) 02/27/2023 0929   BUN 19.5 06/16/2017 1132   CREATININE 0.95 02/27/2023 0929    CREATININE 0.8 06/16/2017 1132      Component Value Date/Time   CALCIUM 9.3 02/27/2023 0929   CALCIUM 10.0 06/16/2017 1132   ALKPHOS 55 02/27/2023 0929   ALKPHOS 68 06/16/2017 1132   AST 25 02/27/2023 0929   AST 21 06/16/2017 1132   ALT 19 02/27/2023 0929   ALT 18 06/16/2017 1132   BILITOT 0.4 02/27/2023 0929   BILITOT 0.50 06/16/2017 1132       RADIOGRAPHIC STUDIES: No results found.  ASSESSMENT AND PLAN:  This is a very pleasant 73 years old white female with recurrent non-small cell lung cancer initially diagnosed as stage IIIA non-small cell lung cancer, adenocarcinoma with positive EGFR mutation in exon 19 status post right upper lobectomy with lymph node dissection followed by adjuvant systemic chemotherapy as well as adjuvant radiation. The patient has been on observation for almost 3 years.   Unfortunately her scan in June 2020 showed multiple tiny pulmonary nodules scattered throughout both lungs which many of them have increased in size and some new nodule suspected the previous scan.  The patient started treatment with Tagrisso 80 mg p.o. daily status post 54 months of treatment.  She has been tolerating this treatment fairly well with no concerning adverse effect except for recurrent hot flashes.    Non-Small Cell Lung Cancer (NSCLC) NSCLC with recurrence in 2020 presenting as bilateral pulmonary nodules. On Tagrisso (osimertinib) for 54 months with good tolerance and no significant side effects. Occasional night sweats attributed to environmental factors. No new complaints or side effects. Monitored for potential cardiovascular side effects of Tagrisso, including EKG and echocardiogram. Discussed new findings on Tagrisso and heart attacks, emphasizing monitoring for those with baseline heart disease. Mentioned ongoing trials for those who fail Tagrisso and potential future fourth-generation drugs. - Continue Tagrisso (osimertinib) - Order CBC and chemistry panel -  Schedule follow-up visit in three months - Order scan one week or longer before the next visit  General Health Maintenance Routine monitoring for cardiovascular health due to potential side effects of Tagrisso. - Ensure routine follow-up with cardiologist - Monitor electrolytes and EKG regularly  Follow-up - Schedule follow-up visit in three months - Order scan and lab work one week or longer before the next visit.   The patient was advised to call immediately if she has any other concerning symptoms in the interval. The patient voices understanding of current disease status and treatment options and is in agreement with the current care plan. All questions were answered. The patient knows to call the clinic with any problems, questions or concerns. We can certainly see the patient  much sooner if necessary. The total time spent in the appointment was 20 minutes.  Disclaimer: This note was dictated with voice recognition software. Similar sounding words can inadvertently be transcribed and may not be corrected upon review.

## 2023-06-27 ENCOUNTER — Other Ambulatory Visit (HOSPITAL_COMMUNITY): Payer: Self-pay

## 2023-06-27 NOTE — Progress Notes (Addendum)
 Specialty Pharmacy Ongoing Clinical Assessment Note  [name] West is a 73 y.o. female who is being followed by the specialty pharmacy service for RxSp Oncology   Patient's specialty medication(s) reviewed today: Osimertinib  Mesylate (TAGRISSO )   Missed doses in the last 4 weeks: 0   Patient/Caregiver did not have any additional questions or concerns.   Therapeutic benefit summary: Patient is achieving benefit   Adverse events/side effects summary: Experienced adverse events/side effects (brittle nails and dry skin, both tolerable at this time)   Patient's therapy is appropriate to: Continue    Goals Addressed             This Visit's Progress    Slow Disease Progression       Patient is on track. Patient will maintain adherence. Patient remains stable at this time.          Follow up:  6 months  Silvano LOISE Dolly Specialty Pharmacist

## 2023-06-27 NOTE — Progress Notes (Signed)
 Specialty Pharmacy Refill Coordination Note  [name] West is a 73 y.o. female contacted today regarding refills of specialty medication(s) Osimertinib  Mesylate (TAGRISSO )   Patient requested Delivery   Delivery date: 07/06/23   Verified address: [address] [place] [address]   Medication will be filled on 07/05/23 Lucillie checking on available funding.

## 2023-06-28 ENCOUNTER — Telehealth: Payer: Self-pay | Admitting: Pharmacy Technician

## 2023-06-28 ENCOUNTER — Other Ambulatory Visit (HOSPITAL_COMMUNITY): Payer: Self-pay

## 2023-06-28 NOTE — Telephone Encounter (Signed)
 Oral Oncology Patient Advocate Encounter   Was successful in securing patient a $ 2,000 grant from [employer] to provide copayment coverage for Tagrisso .  The patient's out of pocket cost will be $10 monthly.     I have spoken with the patient.    The billing information is as follows and has been shared with [facility].   Member ID: [id number] Group ID: [id number] RxBin: [id number] Dates of Eligibility: 06/27/23 through 06/12/24   Paulette Borrow, CPhT-Adv Oncology Pharmacy Patient Advocate [facility] Direct Number: [phone]  Fax: [phone]

## 2023-06-29 DIAGNOSIS — E785 Hyperlipidemia, unspecified: Secondary | ICD-10-CM | POA: Diagnosis not present

## 2023-06-29 DIAGNOSIS — K5903 Drug induced constipation: Secondary | ICD-10-CM | POA: Diagnosis not present

## 2023-06-29 DIAGNOSIS — N952 Postmenopausal atrophic vaginitis: Secondary | ICD-10-CM | POA: Diagnosis not present

## 2023-06-29 DIAGNOSIS — F411 Generalized anxiety disorder: Secondary | ICD-10-CM | POA: Diagnosis not present

## 2023-06-29 DIAGNOSIS — C3491 Malignant neoplasm of unspecified part of right bronchus or lung: Secondary | ICD-10-CM | POA: Diagnosis not present

## 2023-06-29 DIAGNOSIS — Z01419 Encounter for gynecological examination (general) (routine) without abnormal findings: Secondary | ICD-10-CM | POA: Diagnosis not present

## 2023-06-29 DIAGNOSIS — J310 Chronic rhinitis: Secondary | ICD-10-CM | POA: Diagnosis not present

## 2023-06-29 DIAGNOSIS — Z0000 Encounter for general adult medical examination without abnormal findings: Secondary | ICD-10-CM | POA: Diagnosis not present

## 2023-06-29 DIAGNOSIS — L309 Dermatitis, unspecified: Secondary | ICD-10-CM | POA: Diagnosis not present

## 2023-06-29 DIAGNOSIS — Z681 Body mass index (BMI) 19 or less, adult: Secondary | ICD-10-CM | POA: Diagnosis not present

## 2023-06-29 DIAGNOSIS — Z91038 Other insect allergy status: Secondary | ICD-10-CM | POA: Diagnosis not present

## 2023-06-29 DIAGNOSIS — M818 Other osteoporosis without current pathological fracture: Secondary | ICD-10-CM | POA: Diagnosis not present

## 2023-06-30 ENCOUNTER — Other Ambulatory Visit: Payer: Self-pay

## 2023-06-30 NOTE — Progress Notes (Signed)
Patient called back and requested we ship 1/20 instead and provided updated cc for $10 copay after grant.

## 2023-07-03 ENCOUNTER — Other Ambulatory Visit (HOSPITAL_COMMUNITY): Payer: Self-pay

## 2023-07-03 ENCOUNTER — Other Ambulatory Visit: Payer: Self-pay

## 2023-07-04 ENCOUNTER — Other Ambulatory Visit: Payer: Self-pay

## 2023-07-04 NOTE — Progress Notes (Signed)
Clinical Intervention Note  Clinical Intervention Notes: Patient was prescribed Azelastine nasal spray and called to confirm no issue to start using. No DDIs identified with Tagrisso.   Clinical Intervention Outcomes: Prevention of an adverse drug event   Otto Herb Specialty Pharmacist

## 2023-07-07 DIAGNOSIS — Z1211 Encounter for screening for malignant neoplasm of colon: Secondary | ICD-10-CM | POA: Diagnosis not present

## 2023-07-10 ENCOUNTER — Other Ambulatory Visit: Payer: Self-pay | Admitting: *Deleted

## 2023-07-10 DIAGNOSIS — E785 Hyperlipidemia, unspecified: Secondary | ICD-10-CM

## 2023-07-26 ENCOUNTER — Other Ambulatory Visit (HOSPITAL_COMMUNITY): Payer: Self-pay

## 2023-07-28 ENCOUNTER — Other Ambulatory Visit: Payer: Self-pay

## 2023-07-28 NOTE — Progress Notes (Signed)
Specialty Pharmacy Refill Coordination Note  [name] West is a 73 y.o. female contacted today regarding refills of specialty medication(s) Osimertinib Mesylate [name])   Patient requested Delivery   Delivery date: 08/03/23   Verified address: [address] [place] [address]   Medication will be filled on 02.19.25.

## 2023-07-31 ENCOUNTER — Other Ambulatory Visit: Payer: Self-pay

## 2023-07-31 NOTE — Progress Notes (Signed)
07/31/23-[name]  Due to possible inclement weather on Wednesday 02/19, patient wants medication mailed 02/17 for 02/18 delivery.

## 2023-08-07 DIAGNOSIS — L299 Pruritus, unspecified: Secondary | ICD-10-CM | POA: Diagnosis not present

## 2023-08-07 DIAGNOSIS — Z86018 Personal history of other benign neoplasm: Secondary | ICD-10-CM | POA: Diagnosis not present

## 2023-08-07 DIAGNOSIS — J309 Allergic rhinitis, unspecified: Secondary | ICD-10-CM | POA: Diagnosis not present

## 2023-08-07 DIAGNOSIS — L814 Other melanin hyperpigmentation: Secondary | ICD-10-CM | POA: Diagnosis not present

## 2023-08-07 DIAGNOSIS — L821 Other seborrheic keratosis: Secondary | ICD-10-CM | POA: Diagnosis not present

## 2023-08-07 DIAGNOSIS — D225 Melanocytic nevi of trunk: Secondary | ICD-10-CM | POA: Diagnosis not present

## 2023-08-15 DIAGNOSIS — E785 Hyperlipidemia, unspecified: Secondary | ICD-10-CM | POA: Diagnosis not present

## 2023-08-16 LAB — NMR, LIPOPROFILE
Cholesterol, Total: 187 mg/dL (ref 100–199)
HDL Particle Number: 36.9 umol/L (ref >=30.5)
HDL-C: 94 mg/dL (ref >39)
LDL Particle Number: 742 nmol/L (ref <1000)
LDL Size: 21.1 nm (ref >20.5)
LDL-C (NIH Calc): 78 mg/dL (ref 0–99)
LP-IR Score: 27 (ref <=45)
Small LDL Particle Number: <90 nmol/L (ref <=527)
Triglycerides: 82 mg/dL (ref 0–149)

## 2023-08-21 ENCOUNTER — Other Ambulatory Visit: Payer: Medicare Other

## 2023-08-21 ENCOUNTER — Ambulatory Visit (HOSPITAL_BASED_OUTPATIENT_CLINIC_OR_DEPARTMENT_OTHER)
Admission: RE | Admit: 2023-08-21 | Discharge: 2023-08-21 | Disposition: A | Discharge status: Home / Self Care | Source: Ambulatory Visit | Attending: Internal Medicine | Admitting: Internal Medicine

## 2023-08-21 ENCOUNTER — Inpatient Hospital Stay: Payer: Medicare Other | Attending: Internal Medicine

## 2023-08-21 DIAGNOSIS — C3411 Malignant neoplasm of upper lobe, right bronchus or lung: Secondary | ICD-10-CM | POA: Diagnosis not present

## 2023-08-21 DIAGNOSIS — C3490 Malignant neoplasm of unspecified part of unspecified bronchus or lung: Secondary | ICD-10-CM | POA: Insufficient documentation

## 2023-08-21 DIAGNOSIS — R140 Abdominal distension (gaseous): Secondary | ICD-10-CM | POA: Diagnosis not present

## 2023-08-21 DIAGNOSIS — Z79899 Other long term (current) drug therapy: Secondary | ICD-10-CM | POA: Insufficient documentation

## 2023-08-21 DIAGNOSIS — I700 Atherosclerosis of aorta: Secondary | ICD-10-CM | POA: Diagnosis not present

## 2023-08-21 DIAGNOSIS — K760 Fatty (change of) liver, not elsewhere classified: Secondary | ICD-10-CM | POA: Diagnosis not present

## 2023-08-21 LAB — CMP (CANCER CENTER ONLY)
ALT: 21 U/L (ref 0–44)
AST: 25 U/L (ref 15–41)
Albumin: 4.9 g/dL (ref 3.5–5.0)
Alkaline Phosphatase: 57 U/L (ref 38–126)
Anion gap: 9 (ref 5–15)
BUN: 28 mg/dL — ABNORMAL HIGH (ref 8–23)
CO2: 27 mmol/L (ref 22–32)
Calcium: 9.4 mg/dL (ref 8.9–10.3)
Chloride: 99 mmol/L (ref 98–111)
Creatinine: 1.07 mg/dL — ABNORMAL HIGH (ref 0.44–1.00)
GFR, Estimated: 55 mL/min — ABNORMAL LOW (ref >60)
Glucose, Bld: 297 mg/dL — ABNORMAL HIGH (ref 70–99)
Potassium: 4.3 mmol/L (ref 3.5–5.1)
Sodium: 135 mmol/L (ref 135–145)
Total Bilirubin: 0.4 mg/dL (ref 0.0–1.2)
Total Protein: 7.1 g/dL (ref 6.5–8.1)

## 2023-08-21 LAB — CBC WITH DIFFERENTIAL (CANCER CENTER ONLY)
Abs Immature Granulocytes: 0.02 10*3/uL (ref 0.00–0.07)
Basophils Absolute: 0 10*3/uL (ref 0.0–0.1)
Basophils Relative: 0 %
Eosinophils Absolute: 0 10*3/uL (ref 0.0–0.5)
Eosinophils Relative: 0 %
HCT: 43.9 % (ref 36.0–46.0)
Hemoglobin: 14.5 g/dL (ref 12.0–15.0)
Immature Granulocytes: 0 %
Lymphocytes Relative: 12 %
Lymphs Abs: 0.6 10*3/uL — ABNORMAL LOW (ref 0.7–4.0)
MCH: 30.8 pg (ref 26.0–34.0)
MCHC: 33 g/dL (ref 30.0–36.0)
MCV: 93.2 fL (ref 80.0–100.0)
Monocytes Absolute: 0.1 10*3/uL (ref 0.1–1.0)
Monocytes Relative: 1 %
Neutro Abs: 4.5 10*3/uL (ref 1.7–7.7)
Neutrophils Relative %: 87 %
Platelet Count: 211 10*3/uL (ref 150–400)
RBC: 4.71 MIL/uL (ref 3.87–5.11)
RDW: 13 % (ref 11.5–15.5)
WBC Count: 5.2 10*3/uL (ref 4.0–10.5)
nRBC: 0 % (ref 0.0–0.2)

## 2023-08-21 MED ORDER — IOHEXOL 300 MG/ML  SOLN
100.0000 mL | Freq: Once | INTRAMUSCULAR | Status: AC | PRN
  Administered 2023-08-21: 80 mL via INTRAVENOUS

## 2023-08-24 ENCOUNTER — Other Ambulatory Visit: Payer: Self-pay

## 2023-08-24 ENCOUNTER — Other Ambulatory Visit (HOSPITAL_COMMUNITY): Payer: Self-pay

## 2023-08-24 NOTE — Progress Notes (Signed)
 Specialty Pharmacy Refill Coordination Note  [name] West is a 73 y.o. female contacted today regarding refills of specialty medication(s) Osimertinib Mesylate [name])   Patient requested (Patient-Rptd) Delivery   Delivery date: (Patient-Rptd) 08/31/13   Verified [address] ([address]Rptd[address] [place] [address]   Medication will be filled on 08/31/23.

## 2023-08-25 NOTE — Progress Notes (Signed)
 I called reading room and Requested pt's CT C/A/P from 08/21/23 be read and reported in anticipation of pts follow up appt with Dr Arbutus Ped on 08/28/23 at 9:00am Spoke to Clyde [place]. [name] West states she has moved pts imaging to be read stat and noted pts appt time.

## 2023-08-28 ENCOUNTER — Inpatient Hospital Stay (HOSPITAL_BASED_OUTPATIENT_CLINIC_OR_DEPARTMENT_OTHER): Payer: Medicare Other | Admitting: Internal Medicine

## 2023-08-28 VITALS — BP 106/62 | HR 86 | Temp 97.8°F | Resp 16 | Ht 59.0 in | Wt 91.8 lb

## 2023-08-28 DIAGNOSIS — C3411 Malignant neoplasm of upper lobe, right bronchus or lung: Secondary | ICD-10-CM | POA: Diagnosis not present

## 2023-08-28 DIAGNOSIS — Z79899 Other long term (current) drug therapy: Secondary | ICD-10-CM | POA: Diagnosis not present

## 2023-08-28 NOTE — Progress Notes (Signed)
 [facility] [phone]   [phone]  OFFICE PROGRESS NOTE  [name], MD 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  PRIOR THERAPY: 1) Right VATS with right upper lobectomy with bronchoplasty closure and en bloc resection of a wedge from the superior segment of the right lower lobe in addition to mediastinal lymph node dissection on 07/30/2015 under the care of Dr. Dorris Fetch. 2) Adjuvant systemic chemotherapy with cisplatin 75 MG/M2 and Alimta 500 MG/M2 every 3 weeks. First dose was given on 09/10/2015. Status post 4 cycles. 3) adjuvant radiotherapy under the care of Dr. Kathrynn Running.  CURRENT THERAPY: Tagrisso 80 mg p.o. daily started 12/01/2018.  Status post 57 months of treatment.  INTERVAL HISTORY: [name] West 73 y.o. female returns to the clinic today for 80-month follow-up visit.Discussed the use of AI scribe software for clinical note transcription with the patient, who gave verbal consent to proceed.  History of Present Illness   [name] West is a 73 year old female with adenocarcinoma who presents for follow-up of her treatment with Tagrisso.  She has a history of adenocarcinoma, initially diagnosed in February 2017, for which she underwent a right upper lobectomy followed by adjuvant chemotherapy and radiation therapy. She has an EGFR mutation, specifically exon 19 deletion.  In June 2020, she experienced a recurrence of her cancer and was started on Tagrisso (osimertinib) at a dose of 80 mg once daily. She has been on this treatment for 57 months. No new complaints or side effects such as chest pain, breathing issues,  nausea, vomiting, diarrhea, rash, or changes in fingernails.  Recent imaging showed fibrotic changes and pleural thickening with some distortion on the right side, consistent with her surgical history. Two nodules were noted to be larger, attributed to differences in imaging technique. Tiny nodules on the left are being monitored.  She underwent a CT scan of the abdomen, which included a kidney scan due to protocol for individuals over fifty. Her creatinine levels were slightly elevated but not significantly high. The scan was performed to monitor for potential metastasis given her history of lung cancer.        MEDICAL HISTORY: Past Medical History:  Diagnosis Date   Anxiety    Arthritis    shoulders   Cancer (HCC) 06/2014   Lung Cancer   Complication of anesthesia    nausea, does well with PROPOFOL    Cough 09/17/2015   Encounter for antineoplastic chemotherapy 10/22/2015   Environmental and seasonal allergies    Family history of adverse reaction to anesthesia    Patients grandmother died while having hand surgery; pt unsure of cause   Oral thrush 10/22/2015   Ovarian cyst    PONV (postoperative nausea and vomiting)    Raynaud disease     ALLERGIES:  is allergic to avelox [moxifloxacin hcl in nacl], cephalexin, cephalosporins, ciprofloxacin hcl, clindamycin/lincomycin, shrimp [shellfish allergy], macrobid [nitrofurantoin monohyd macro], omnipaque [iohexol], azithromycin, fluconazole, moxifloxacin, adhesive [tape], lidoderm [lidocaine], pseudoephedrine, and sulfa antibiotics.  MEDICATIONS:  Current Outpatient Medications  Medication Sig Dispense Refill   cholecalciferol (VITAMIN D) 1000 UNITS tablet Take 1,000 Units by mouth 3 (three) times a week.     citalopram (CELEXA) 10 MG  tablet Take 10 mg by mouth daily.     clobetasol ointment (TEMOVATE) 0.05 % 1 application     Clobetasol Prop Oint-Coal Tar (CLOBETAPLUS OINTMENT EX) Apply 1 Dose topically daily.     EPINEPHrine 0.3  mg/0.3 mL IJ SOAJ injection Inject into the muscle as directed.     estradiol (ESTRACE) 0.1 MG/GM vaginal cream See admin instructions.     ibuprofen (ADVIL,MOTRIN) 200 MG tablet Take 200 mg by mouth every 6 (six) hours as needed (pain).     levocetirizine (XYZAL) 5 MG tablet Take 2.5 mg by mouth every evening.     LORazepam (ATIVAN) 0.5 MG tablet Take 0.5 mg by mouth at bedtime as needed for anxiety or sleep.      Multiple Vitamin (MULTIVITAMIN WITH MINERALS) TABS Take 1 tablet by mouth daily.     NONFORMULARY OR COMPOUNDED ITEM Place 1 application vaginally 2 (two) times a week. Estradiol 0.02% Cream     osimertinib mesylate (TAGRISSO) 80 MG tablet TAKE 1 TABLET (80 MG TOTAL) BY MOUTH DAILY. 60 tablet 2   polyethylene glycol powder (GLYCOLAX/MIRALAX) 17 GM/SCOOP powder See admin instructions.     predniSONE (DELTASONE) 50 MG tablet TAKE 1 TABLET BY MOUTH 13 HOURS PRIOR TO CT, 1 TABLET 7 HOURS PRIOR & 1 TABLET 1 HOUR PRIOR TO CT 3 tablet 3   Probiotic Product (ALIGN) 4 MG CAPS Take 1 capsule by mouth daily.     rosuvastatin (CRESTOR) 5 MG tablet TAKE 1 TABLET (5 MG TOTAL) BY MOUTH DAILY. 90 tablet 3   Wheat Dextrin (BENEFIBER) POWD See admin instructions.     No current facility-administered medications for this visit.    SURGICAL HISTORY:  Past Surgical History:  Procedure Laterality Date   BREAST EXCISIONAL BIOPSY Left    1996   CESAREAN SECTION  1984   COLONOSCOPY WITH PROPOFOL N/A 09/27/2012   Procedure: COLONOSCOPY WITH PROPOFOL;  Surgeon: Charolett Bumpers, MD;  Location: [facility];  Service: Endoscopy;  Laterality: N/A;   Fibroid Removed     LAPAROTOMY Left 07/20/2016   Procedure: MINI LAPAROTOMY, REMOVAL OF LEFT OVARIAN CYST, LEFT SALPINGO-OOPHORECTOMY;  Surgeon: Marcelle Overlie, MD;  Location: [facility];  Service: Gynecology;  Laterality: Left;  please moved to 7:30am if spot opens   Right Rotator Cuff Right    TOTAL SHOULDER ARTHROPLASTY Left 06/14/2018   Procedure: TOTAL SHOULDER  ARTHROPLASTY;  Surgeon: Francena Hanly, MD;  Location: [facility];  Service: Orthopedics;  Laterality: Left;    VIDEO ASSISTED THORACOSCOPY (VATS)/ LOBECTOMY Right 07/30/2015   Procedure: RIGHT VIDEO ASSISTED THORACOSCOPY (VATS)/RIGHT UPPER LOBECTOMY;  Surgeon: Loreli Slot, MD;  Location: [facility];  Service: Thoracic;  Laterality: Right;   VIDEO BRONCHOSCOPY WITH ENDOBRONCHIAL NAVIGATION N/A 07/15/2015   Procedure: VIDEO BRONCHOSCOPY WITH ENDOBRONCHIAL NAVIGATION with Biopsy;  Surgeon: Leslye Peer, MD;  Location: [facility];  Service: Thoracic;  Laterality: N/A;    REVIEW OF SYSTEMS:  Constitutional: negative Eyes: negative Ears, nose, mouth, throat, and face: negative Respiratory: negative Cardiovascular: negative Gastrointestinal: negative Genitourinary:negative Integument/breast: negative Hematologic/lymphatic: negative Musculoskeletal:negative Neurological: negative Behavioral/Psych: negative Endocrine: negative Allergic/Immunologic: negative   PHYSICAL EXAMINATION: General appearance: alert, cooperative, and no distress Head: Normocephalic, without obvious abnormality, atraumatic Neck: no adenopathy, no JVD, supple, symmetrical, trachea midline, and thyroid not enlarged, symmetric, no tenderness/mass/nodules Lymph nodes: Cervical, supraclavicular, and axillary nodes normal. Resp: clear to auscultation bilaterally Back: symmetric, no curvature. ROM normal. No CVA tenderness. Cardio: regular rate and rhythm, S1, S2 normal, no murmur,  click, rub or gallop GI: soft, non-tender; bowel sounds normal; no masses,  no organomegaly Extremities: extremities normal, atraumatic, no cyanosis or edema Neurologic: Alert and oriented X 3, normal strength and tone. Normal symmetric reflexes. Normal coordination and gait  ECOG PERFORMANCE STATUS: 0 - Asymptomatic  Blood pressure 106/62, pulse 86, temperature 97.8 F (36.6 C), temperature source Temporal, resp. rate 16, height 4\' 11"  (1.499  m), weight 91 lb 12.8 oz (41.6 kg), SpO2 100%.   LABORATORY DATA: Lab Results  Component Value Date   WBC 5.2 08/21/2023   HGB 14.5 08/21/2023   HCT 43.9 08/21/2023   MCV 93.2 08/21/2023   PLT 211 08/21/2023      Chemistry      Component Value Date/Time   NA 135 08/21/2023 0911   NA 139 06/16/2017 1132   K 4.3 08/21/2023 0911   K 4.7 06/16/2017 1132   CL 99 08/21/2023 0911   CO2 27 08/21/2023 0911   CO2 28 06/16/2017 1132   BUN 28 (H) 08/21/2023 0911   BUN 19.5 06/16/2017 1132   CREATININE 1.07 (H) 08/21/2023 0911   CREATININE 0.8 06/16/2017 1132      Component Value Date/Time   CALCIUM 9.4 08/21/2023 0911   CALCIUM 10.0 06/16/2017 1132   ALKPHOS 57 08/21/2023 0911   ALKPHOS 68 06/16/2017 1132   AST 25 08/21/2023 0911   AST 21 06/16/2017 1132   ALT 21 08/21/2023 0911   ALT 18 06/16/2017 1132   BILITOT 0.4 08/21/2023 0911   BILITOT 0.50 06/16/2017 1132       RADIOGRAPHIC STUDIES: CT CHEST ABDOMEN PELVIS W CONTRAST Result Date: 08/25/2023 CLINICAL DATA:  Staging non-small-cell lung cancer. Chemotherapy and radiation. * Tracking Code: BO * EXAM: CT CHEST, ABDOMEN, AND PELVIS WITH CONTRAST TECHNIQUE: Multidetector CT imaging of the chest, abdomen and pelvis was performed following the standard protocol during bolus administration of intravenous contrast. RADIATION DOSE REDUCTION: This exam was performed according to the departmental dose-optimization program which includes automated exposure control, adjustment of the mA and/or kV according to patient size and/or use of iterative reconstruction technique. CONTRAST:  80mL OMNIPAQUE IOHEXOL 300 MG/ML  SOLN COMPARISON:  02/27/2023 CT scan and older FINDINGS: CT CHEST FINDINGS Cardiovascular: The thoracic aorta has a normal course and caliber with slight atherosclerotic plaque. Pulsation artifact along the ascending aorta. Stable cardiac size. Trace pericardial fluid Mediastinum/Nodes: Slightly patulous thoracic esophagus. Small  thyroid gland. No specific abnormal lymph node enlargement identified in the axillary regions, hilum or mediastinum Lungs/Pleura: Breathing motion. There are postsurgical changes identified along the right upper lung with scarring, fibrotic changes and pleural thickening. Some distortion. Previous right upper lobectomy. Extent and distribution is similar to previous. There is a stable juxtapleural nodule along the right hemithorax posterolaterally measuring 6 mm on series 4, image 75 and previously 5 mm, not significantly changed when adjusted for technique. Similar posterior focus measuring 5 mm on series 4, image 65 is stable as well. Additional stable small right-sided lung nodules such as right upper lobe image 41. No consolidation, pneumothorax or effusion on the right. The left lung is without consolidation, pneumothorax or effusion. Once again there are some tiny nodules identified the left upper lobe such as juxtapleural lateral series 4, image 58 measuring 3 mm. Additional foci image 52, 46 are stable. Musculoskeletal: Streak artifact related to the patient's left shoulder arthroplasty. Degenerative changes once again seen along the spine. CT ABDOMEN PELVIS FINDINGS Hepatobiliary: Fatty liver infiltration identified. Gallbladder is nondilated. Patent portal vein.  Pancreas: Unremarkable. No pancreatic ductal dilatation or surrounding inflammatory changes. Spleen: Normal in size without focal abnormality. Adrenals/Urinary Tract: Adrenal glands are preserved. No enhancing renal mass or collecting system dilatation. Extrarenal pelvis seen each kidney. Underdistended urinary bladder. Stomach/Bowel: Oral contrast was administered. Stomach is distended with contrast and debris. Small bowel is nondilated. Large amount of diffuse colonic stool. No bowel obstruction. Evaluation somewhat limited by lack of intra-abdominal fat. Normal appendix Vascular/Lymphatic: Aortic atherosclerosis. No enlarged abdominal or pelvic  lymph nodes. Reproductive: Uterus and bilateral adnexa are unremarkable. Other: No free air or free fluid. Musculoskeletal: Degenerative changes seen of the spine and pelvis. Once again there are pars defects at L5. Trace listhesis. IMPRESSION: No significant interval change. Stable postsurgical changes of prior right lobectomy. Scarring and fibrotic changes. No developing new mass lesion, fluid collection or lymph node enlargement. No bowel obstruction.  Significant colonic stool. Fatty liver infiltration Electronically Signed   By: Karen Kays M.D.   On: 08/25/2023 12:23    ASSESSMENT AND PLAN:  This is a very pleasant 73 years old white female with recurrent non-small cell lung cancer initially diagnosed as stage IIIA non-small cell lung cancer, adenocarcinoma with positive EGFR mutation in exon 19 status post right upper lobectomy with lymph node dissection followed by adjuvant systemic chemotherapy as well as adjuvant radiation. The patient has been on observation for almost 3 years.   Unfortunately her scan in June 2020 showed multiple tiny pulmonary nodules scattered throughout both lungs which many of them have increased in size and some new nodule suspected the previous scan.  The patient started treatment with Tagrisso 80 mg p.o. daily status post 57 months of treatment.   She had repeat CT scan of the chest, abdomen and pelvis performed recently.  I personally independently reviewed the scan and discussed the result with the patient today.  Her scan showed no concerning findings for disease progression.    Non-small cell lung cancer with EGFR mutation Diagnosed in February 2017 with an EGFR exon 19 mutation, she underwent a right upper lobectomy followed by adjuvant chemotherapy and radiation therapy. Recurrence occurred in June 2020, and she has been on Tagrisso (osimertinib) 80 mg daily for 57 months. Recent imaging shows fibrotic changes and pleural thickening on the right side, consistent  with post-surgical changes, with no significant changes in nodule size when adjusted for technique. She reports no new symptoms or side effects from Tagrisso, except for issues with fingernails. The decision to continue Tagrisso is based on the well-managed condition and absence of significant side effects. - Continue Tagrisso 80 mg daily - Monitor for side effects of Tagrisso - Schedule follow-up in 3 months with repeat blood work, no scan  Renal function monitoring A CT scan of the abdomen, including a kidney scan, was performed due to protocol for patients over 50, despite creatinine levels not being significantly high. The abdomen was included to monitor for potential metastasis to the liver or bones, given her lung cancer history, and past concerns in the adnexal area. - Monitor renal function as part of routine blood work   The patient was advised to call immediately if she has any concerning symptoms in the interval. The patient voices understanding of current disease status and treatment options and is in agreement with the current care plan. All questions were answered. The patient knows to call the clinic with any problems, questions or concerns. We can certainly see the patient much sooner if necessary. The total time spent in  the appointment was 30 minutes.  Disclaimer: This note was dictated with voice recognition software. Similar sounding words can inadvertently be transcribed and may not be corrected upon review.

## 2023-08-29 DIAGNOSIS — M5412 Radiculopathy, cervical region: Secondary | ICD-10-CM | POA: Diagnosis not present

## 2023-08-29 DIAGNOSIS — C3491 Malignant neoplasm of unspecified part of right bronchus or lung: Secondary | ICD-10-CM | POA: Diagnosis not present

## 2023-08-29 DIAGNOSIS — E785 Hyperlipidemia, unspecified: Secondary | ICD-10-CM | POA: Diagnosis not present

## 2023-08-29 DIAGNOSIS — J310 Chronic rhinitis: Secondary | ICD-10-CM | POA: Diagnosis not present

## 2023-08-29 DIAGNOSIS — Z23 Encounter for immunization: Secondary | ICD-10-CM | POA: Diagnosis not present

## 2023-08-29 DIAGNOSIS — M8580 Other specified disorders of bone density and structure, unspecified site: Secondary | ICD-10-CM | POA: Diagnosis not present

## 2023-08-31 ENCOUNTER — Ambulatory Visit: Payer: Medicare Other | Attending: Internal Medicine | Admitting: Internal Medicine

## 2023-08-31 ENCOUNTER — Other Ambulatory Visit (HOSPITAL_COMMUNITY): Payer: Self-pay

## 2023-08-31 ENCOUNTER — Encounter: Payer: Self-pay | Admitting: Internal Medicine

## 2023-08-31 ENCOUNTER — Other Ambulatory Visit: Payer: Self-pay

## 2023-08-31 VITALS — BP 100/60 | HR 80 | Ht 59.5 in | Wt 91.6 lb

## 2023-08-31 DIAGNOSIS — I2510 Atherosclerotic heart disease of native coronary artery without angina pectoris: Secondary | ICD-10-CM | POA: Insufficient documentation

## 2023-08-31 DIAGNOSIS — E785 Hyperlipidemia, unspecified: Secondary | ICD-10-CM | POA: Insufficient documentation

## 2023-08-31 NOTE — Patient Instructions (Signed)
 Medication Instructions:  NO CHANGES  *If you need a refill on your cardiac medications before your next appointment, please call your pharmacy*  Follow-Up: At [facility], you and your health needs are our priority.  As part of our continuing mission to provide you with exceptional heart care, we have created designated Provider Care Teams.  These Care Teams include your primary Cardiologist (physician) and Advanced Practice Providers (APPs -  Physician Assistants and Nurse Practitioners) who all work together to provide you with the care you need, when you need it.  We recommend signing up for the patient portal called "MyChart".  Sign up information is provided on this After Visit Summary.  MyChart is used to connect with patients for Virtual Visits (Telemedicine).  Patients are able to view lab/test results, encounter notes, upcoming appointments, etc.  Non-urgent messages can be sent to your provider as well.   To learn more about what you can do with MyChart, go to [email or web address].    Your next appointment:   AS NEEDED with Dr. Rennis Golden  Other Instructions   1st Floor: - Lobby - Registration  - Pharmacy  - Lab - Cafe  2nd Floor: - PV Lab - Diagnostic Testing (echo, CT, nuclear med)  3rd Floor: - Vacant  4th Floor: - TCTS (cardiothoracic surgery) - AFib Clinic - Structural Heart Clinic - Vascular Surgery  - Vascular Ultrasound  5th Floor: - HeartCare Cardiology (general and EP) - Clinical Pharmacy for coumadin, hypertension, lipid, weight-loss medications, and med management appointments    Valet parking services will be available as well.

## 2023-08-31 NOTE — Progress Notes (Signed)
 OFFICE CONSULT NOTE  Chief Complaint:  Follow-up dyslipidemia  Primary Care Physician: [name], MD  HPI:  [name] West is a 73 y.o. female who is being seen today for the evaluation of coronary artery calcification at the request of [name], MD.  This is a pleasant 73 year old female with unfortunate history of non-small cell lung cancer.  She underwent lobectomy by Dr. Dorris Fetch in 2017 with radiation chemotherapy.  Fortunately she seems to be cancer free with some recurrent scans.  Recently she is developed some inguinal lymphadenopathy.  She is on antibiotics for this however her is concerned about a neoplastic process.  Her most recent screening CT scan of the chest did demonstrate coronary artery calcification both in the LAD as well as aortic atherosclerosis which was calcified.  Family history is significant for a brother 2 years older who has coronary artery disease and has had an MI and CABG (however she notes he is live with HIV disease on HAART for more than 30 years).  She denies any history of diabetes or hypertension in fact does not take any daily medications.  Her last lipid profile was November 2018 which showed total cholesterol 210, HDL 81, triglycerides 65 and LDL 116.  She is asymptomatic from an anginal standpoint, but does report shortness of breath with exertion.  She felt like this may mostly be related to her lung cancer and the fact that she has had partial lung removal.  12/22/2017  [name] West returns today for follow-up of her stress echocardiogram.  Fortunately this was a negative study without any exercise-induced wall motion abnormalities and normal LV function.  Overall she feels well except for ongoing shoulder problems.  She is scheduled for sold shoulder surgery in January.  She is supposed to have another CT scan of her chest in December.  We discussed the fact that she does have coronary disease as evidenced by her coronary artery  calcifications.  Although her stress test was low risk, her goal LDL is less than 70.  Currently her LDL is 116.  Given her body habitus, thin weight and fairly healthy diet, is unlikely she is going get a significant increased improvement in her numbers.  I did repeat a lipid profile which was an NMR.  Her LDL-C is 110, and her LDL-P is 1151.  Total cholesterol is 219 and LPA was negative at 21.   03/14/2019  [name] West is seen today in follow-up.  She did undergo successful left shoulder replacement.  Overall she is pleased with that.  She is scheduled to have another CT scan in November of her chest for follow-up of cancer.  This had shown coronary calcification and I recommended statin therapy.  She has been somewhat hesitant to do that.  Recently we repeated an NMR lipid profile.  Her total particle number went from 1151 down to 854.  LDL still remains elevated 102 and HDL was 100, triglycerides 57.  03/25/2020  [name] West returns today for annual follow-up.  Overall she continues to do well.  Denies any chest pain or shortness of breath.  She has had no recurrence of her lung cancer as of recent CT scan.  She does have some LAD atherosclerosis.  Her NMR LDL-P was 844, total cholesterol 197, triglycerides 92, HDL 85 and LDL of 96.  Overall representing good control over her lipids.  She is not currently on any therapy.  06/28/2021  [name] West returns today for follow-up.  She reported  she had a viral illness over the holidays and had lost significant amount of weight.  She was actually underweight and weight 85 pounds.  She then intentionally ate a lot of potentially fattening foods in order to get her weight up to now 91 pounds with a BMI of 18.  She thinks this is responsible for an increase in her lipids which were measured on January 5.  Total cholesterol 197, triglycerides 101, HDL 71 and LDL 108.  8/112023  [name] West returns today for follow-up.  She has had further improvement in her lipids with  particle number of 880 down to 545, LDL cholesterol was 113, now 70.  HDL remains high at 90.  Triglyceride 94 and small LDL particles less than 90.  Overall very favorable lipid profile.  Blood pressure is well controlled today.  09/01/2022  [name] West returns today for follow-up.  She has been tolerating rosuvastatin seemingly much better.  She has had some hot flashes which she wondered might be related to the statin however this would be fairly rare in my experience.  Her cholesterol has improved substantially.  LDL particle number down to 519 with LDL-C of 63, HDL-C 94, and triglycerides of 54.  08/31/2023  Returns today for follow-up.  She continues to have pretty good control of her lipids.  Although there is been a small increase in her cholesterol which might be attributable she says to eating in a dining hall at [employer] [facility] where they are now residing due to her husband's health issues, her cholesterol remains still pretty well-controlled.  LDL particle #742 with an LDL of 78, HDL 94 triglycerides 82.  He remains asymptomatic denying chest pain or shortness of breath with exertion.  PMHx:  Past Medical History:  Diagnosis Date   Anxiety    Arthritis    shoulders   Cancer (HCC) 06/2014   Lung Cancer   Complication of anesthesia    nausea, does well with PROPOFOL    Cough 09/17/2015   Encounter for antineoplastic chemotherapy 10/22/2015   Environmental and seasonal allergies    Family history of adverse reaction to anesthesia    Patients grandmother died while having hand surgery; pt unsure of cause   Oral thrush 10/22/2015   Ovarian cyst    PONV (postoperative nausea and vomiting)    Raynaud disease     Past Surgical History:  Procedure Laterality Date   BREAST EXCISIONAL BIOPSY Left    1996   CESAREAN SECTION  1984   COLONOSCOPY WITH PROPOFOL N/A 09/27/2012   Procedure: COLONOSCOPY WITH PROPOFOL;  Surgeon: Charolett Bumpers, MD;  Location: [facility];  Service: Endoscopy;   Laterality: N/A;   Fibroid Removed     LAPAROTOMY Left 07/20/2016   Procedure: MINI LAPAROTOMY, REMOVAL OF LEFT OVARIAN CYST, LEFT SALPINGO-OOPHORECTOMY;  Surgeon: Marcelle Overlie, MD;  Location: [facility];  Service: Gynecology;  Laterality: Left;  please moved to 7:30am if spot opens   Right Rotator Cuff Right    TOTAL SHOULDER ARTHROPLASTY Left 06/14/2018   Procedure: TOTAL SHOULDER ARTHROPLASTY;  Surgeon: Francena Hanly, MD;  Location: [facility];  Service: Orthopedics;  Laterality: Left;    VIDEO ASSISTED THORACOSCOPY (VATS)/ LOBECTOMY Right 07/30/2015   Procedure: RIGHT VIDEO ASSISTED THORACOSCOPY (VATS)/RIGHT UPPER LOBECTOMY;  Surgeon: Loreli Slot, MD;  Location: [facility];  Service: Thoracic;  Laterality: Right;   VIDEO BRONCHOSCOPY WITH ENDOBRONCHIAL NAVIGATION N/A 07/15/2015   Procedure: VIDEO BRONCHOSCOPY WITH ENDOBRONCHIAL NAVIGATION with Biopsy;  Surgeon: Leslye Peer, MD;  Location: [facility];  Service: Thoracic;  Laterality: N/A;    FAMHx:  Family History  Problem Relation Age of Onset   Heart disease Brother    Heart attack Brother    Heart disease Brother 73       elevated CAC score    SOCHx:   reports that she has never smoked. She has never used smokeless tobacco. She reports current alcohol use. She reports that she does not use drugs.  ALLERGIES:  Allergies  Allergen Reactions   Avelox [Moxifloxacin Hcl In Nacl] Anaphylaxis   Cephalexin Anaphylaxis   Cephalosporins Anaphylaxis   Ciprofloxacin Hcl Shortness Of Breath   Clindamycin/Lincomycin Anaphylaxis   Shrimp [Shellfish Allergy] Diarrhea    "violent reaction to :shrimp and scallops but not other shell fish-vomiting and diarrhea"   Macrobid [Nitrofurantoin Monohyd Macro] Other (See Comments)    CHEST TIGHTNESS...Marland KitchenHAD USED BEFORE WITH NO REACTION   Omnipaque [Iohexol] Hives   Azithromycin Other (See Comments)   Fluconazole Nausea And Vomiting   Moxifloxacin Other (See Comments)   Adhesive [Tape] Rash    Lidoderm [Lidocaine] Rash   Pseudoephedrine Palpitations   Sulfa Antibiotics Rash    ROS: Pertinent items noted in HPI and remainder of comprehensive ROS otherwise negative.  HOME MEDS: Current Outpatient Medications on File Prior to Visit  Medication Sig Dispense Refill   cholecalciferol (VITAMIN D) 1000 UNITS tablet Take 1,000 Units by mouth 3 (three) times a week.     citalopram (CELEXA) 10 MG tablet Take 10 mg by mouth daily.     clobetasol ointment (TEMOVATE) 0.05 % 1 application     Clobetasol Prop Oint-Coal Tar (CLOBETAPLUS OINTMENT EX) Apply 1 Dose topically daily.     estradiol (ESTRACE) 0.1 MG/GM vaginal cream Place 1 Applicatorful vaginally 2 (two) times a week.     levocetirizine (XYZAL) 5 MG tablet Take 2.5 mg by mouth every evening.     Multiple Vitamin (MULTIVITAMIN WITH MINERALS) TABS Take 1 tablet by mouth daily.     NONFORMULARY OR COMPOUNDED ITEM Place 1 application vaginally 2 (two) times a week. Estradiol 0.02% Cream     osimertinib mesylate (TAGRISSO) 80 MG tablet TAKE 1 TABLET (80 MG TOTAL) BY MOUTH DAILY. 60 tablet 2   polyethylene glycol powder (GLYCOLAX/MIRALAX) 17 GM/SCOOP powder Take by mouth as needed.     Probiotic Product (ALIGN) 4 MG CAPS Take 1 capsule by mouth daily.     rosuvastatin (CRESTOR) 5 MG tablet TAKE 1 TABLET (5 MG TOTAL) BY MOUTH DAILY. 90 tablet 3   Wheat Dextrin (BENEFIBER) POWD Take by mouth daily at 6 (six) AM.     EPINEPHrine 0.3 mg/0.3 mL IJ SOAJ injection Inject into the muscle as directed. (Patient not taking: Reported on 08/31/2023)     ibuprofen (ADVIL,MOTRIN) 200 MG tablet Take 200 mg by mouth every 6 (six) hours as needed (pain). (Patient not taking: Reported on 08/31/2023)     LORazepam (ATIVAN) 0.5 MG tablet Take 0.5 mg by mouth at bedtime as needed for anxiety or sleep.  (Patient not taking: Reported on 08/31/2023)     predniSONE (DELTASONE) 50 MG tablet TAKE 1 TABLET BY MOUTH 13 HOURS PRIOR TO CT, 1 TABLET 7 HOURS PRIOR & 1 TABLET 1  HOUR PRIOR TO CT (Patient not taking: Reported on 08/31/2023) 3 tablet 3   No current facility-administered medications on file prior to visit.    LABS/IMAGING: No results found for this or any previous visit (from the past 48 hours).  No  results found.   LIPID PANEL: No results found for: "CHOL", "TRIG", "HDL", "CHOLHDL", "VLDL", "LDLCALC", "LDLDIRECT"  WEIGHTS: Wt Readings from Last 3 Encounters:  08/31/23 91 lb 9.6 oz (41.5 kg)  08/28/23 91 lb 12.8 oz (41.6 kg)  05/29/23 91 lb 12.8 oz (41.6 kg)    VITALS: BP 100/60 (BP Location: Left Arm, Patient Position: Sitting, Cuff Size: Small)   Pulse 80   Ht 4' 11.5" (1.511 m)   Wt 91 lb 9.6 oz (41.5 kg)   SpO2 97%   BMI 18.19 kg/m   EXAM: Deferred  EKG: Deferred  ASSESSMENT: Coronary artery calcification-LAD and aortic atherosclerosis (negative stress echocardiogram (03/2018)) History of non-small cell adenocarcinoma (status post lobectomy-2017) Family history of coronary artery disease Dyslipidemia-goal LDL less than 70  PLAN: 1.   [name] West is doing well without chest pain or shortness of breath.  Her lipids are generally well-controlled although have gone up a little bit.  She will continue to be mindful of her diet and lifestyle as factors in this.  I would not recommend any changes to her medicines at this time.  Her PCP had recommended that she could follow-up likely with her for management of her dyslipidemia since she is only on low-dose rosuvastatin.  I think this is a good idea and certainly happy to see her back as needed.  Chrystie Nose, MD, [facility], FACP  [place]  [facility]  Medical Director of the [facility]  [facility] Diplomate of the American Board of Clinical Lipidology Attending Cardiologist  Direct Dial: [phone]  Fax: [phone]  Website:  [email or web address] 08/31/2023, 9:55 AM

## 2023-09-19 ENCOUNTER — Other Ambulatory Visit: Payer: Self-pay

## 2023-09-19 ENCOUNTER — Other Ambulatory Visit (HOSPITAL_COMMUNITY): Payer: Self-pay

## 2023-09-19 NOTE — Progress Notes (Signed)
 Specialty Pharmacy Refill Coordination Note  Diany Vester Titsworth is a 73 y.o. female contacted today regarding refills of specialty medication(s) Tagrisso.  Patient requested (Patient-Rptd) Delivery   Delivery date: (Patient-Rptd) 10/03/23   [place]ified [address] [place], [place] [address]   Medication will be filled on 10/02/23.

## 2023-09-25 ENCOUNTER — Other Ambulatory Visit: Payer: Self-pay

## 2023-09-25 NOTE — Progress Notes (Signed)
 Clinical Intervention Note  Clinical Intervention Notes: Patient discontinued Xyzal and started Allegra. No DDIs identified with Tagrisso.   Clinical Intervention Outcomes: Prevention of an adverse drug event   Rena Carnes Specialty Pharmacist

## 2023-09-27 DIAGNOSIS — M542 Cervicalgia: Secondary | ICD-10-CM | POA: Diagnosis not present

## 2023-09-27 DIAGNOSIS — M5412 Radiculopathy, cervical region: Secondary | ICD-10-CM | POA: Diagnosis not present

## 2023-10-02 ENCOUNTER — Other Ambulatory Visit: Payer: Self-pay

## 2023-10-02 DIAGNOSIS — M5412 Radiculopathy, cervical region: Secondary | ICD-10-CM | POA: Diagnosis not present

## 2023-10-02 DIAGNOSIS — M542 Cervicalgia: Secondary | ICD-10-CM | POA: Diagnosis not present

## 2023-10-05 DIAGNOSIS — M8580 Other specified disorders of bone density and structure, unspecified site: Secondary | ICD-10-CM | POA: Diagnosis not present

## 2023-10-07 DIAGNOSIS — Z23 Encounter for immunization: Secondary | ICD-10-CM | POA: Diagnosis not present

## 2023-10-09 DIAGNOSIS — M542 Cervicalgia: Secondary | ICD-10-CM | POA: Diagnosis not present

## 2023-10-09 DIAGNOSIS — M5412 Radiculopathy, cervical region: Secondary | ICD-10-CM | POA: Diagnosis not present

## 2023-10-19 DIAGNOSIS — M542 Cervicalgia: Secondary | ICD-10-CM | POA: Diagnosis not present

## 2023-10-19 DIAGNOSIS — M5412 Radiculopathy, cervical region: Secondary | ICD-10-CM | POA: Diagnosis not present

## 2023-10-23 ENCOUNTER — Other Ambulatory Visit (HOSPITAL_COMMUNITY): Payer: Self-pay

## 2023-10-23 DIAGNOSIS — M542 Cervicalgia: Secondary | ICD-10-CM | POA: Diagnosis not present

## 2023-10-23 DIAGNOSIS — M5412 Radiculopathy, cervical region: Secondary | ICD-10-CM | POA: Diagnosis not present

## 2023-10-24 ENCOUNTER — Encounter (HOSPITAL_COMMUNITY)
Admission: RE | Admit: 2023-10-24 | Discharge: 2023-10-24 | Disposition: A | Discharge status: Home / Self Care | Source: Ambulatory Visit | Attending: Family Medicine | Admitting: Family Medicine

## 2023-10-24 DIAGNOSIS — M810 Age-related osteoporosis without current pathological fracture: Secondary | ICD-10-CM | POA: Diagnosis not present

## 2023-10-24 MED ORDER — ZOLEDRONIC ACID 5 MG/100ML IV SOLN
5.0000 mg | Freq: Once | INTRAVENOUS | Status: AC
  Administered 2023-10-24: 5 mg via INTRAVENOUS

## 2023-10-24 MED ORDER — ZOLEDRONIC ACID 5 MG/100ML IV SOLN
INTRAVENOUS | Status: AC
  Filled 2023-10-24: qty 100

## 2023-10-26 ENCOUNTER — Other Ambulatory Visit: Payer: Self-pay

## 2023-10-26 ENCOUNTER — Other Ambulatory Visit: Payer: Self-pay | Admitting: Physician Assistant

## 2023-10-26 DIAGNOSIS — C3491 Malignant neoplasm of unspecified part of right bronchus or lung: Secondary | ICD-10-CM

## 2023-10-26 NOTE — Progress Notes (Signed)
 Specialty Pharmacy Refill Coordination Note  [name] West is a 73 y.o. female contacted today regarding refills of specialty medication(s) Osimertinib  Mesylate (TAGRISSO )   Patient requested Delivery   Delivery date: 11/03/23   Verified address: [address], [place] [address]   Medication will be filled on 05.22.25.   This fill date is pending response to refill request from provider. Patient is aware and if they have not received fill by intended date they must follow up with pharmacy.

## 2023-10-27 ENCOUNTER — Other Ambulatory Visit: Payer: Self-pay

## 2023-10-27 ENCOUNTER — Other Ambulatory Visit (HOSPITAL_COMMUNITY): Payer: Self-pay

## 2023-10-27 MED ORDER — OSIMERTINIB MESYLATE 80 MG PO TABS
ORAL_TABLET | Freq: Every day | ORAL | 2 refills | Status: DC
  Filled 2023-10-27: qty 30, 30d supply, fill #0
  Filled 2023-11-27: qty 30, 30d supply, fill #1
  Filled 2023-12-19: qty 30, 30d supply, fill #2
  Filled 2024-01-16: qty 30, 30d supply, fill #3
  Filled 2024-02-27: qty 30, 30d supply, fill #4
  Filled 2024-03-22: qty 30, 30d supply, fill #5

## 2023-10-29 DIAGNOSIS — K5900 Constipation, unspecified: Secondary | ICD-10-CM | POA: Diagnosis not present

## 2023-10-29 DIAGNOSIS — R1013 Epigastric pain: Secondary | ICD-10-CM | POA: Diagnosis not present

## 2023-11-02 ENCOUNTER — Other Ambulatory Visit: Payer: Self-pay

## 2023-11-16 DIAGNOSIS — Z681 Body mass index (BMI) 19 or less, adult: Secondary | ICD-10-CM | POA: Diagnosis not present

## 2023-11-16 DIAGNOSIS — M4126 Other idiopathic scoliosis, lumbar region: Secondary | ICD-10-CM | POA: Diagnosis not present

## 2023-11-16 DIAGNOSIS — K5900 Constipation, unspecified: Secondary | ICD-10-CM | POA: Diagnosis not present

## 2023-11-16 DIAGNOSIS — M25551 Pain in right hip: Secondary | ICD-10-CM | POA: Diagnosis not present

## 2023-11-27 ENCOUNTER — Other Ambulatory Visit: Payer: Self-pay

## 2023-11-27 DIAGNOSIS — M25551 Pain in right hip: Secondary | ICD-10-CM | POA: Diagnosis not present

## 2023-11-27 DIAGNOSIS — Z681 Body mass index (BMI) 19 or less, adult: Secondary | ICD-10-CM | POA: Diagnosis not present

## 2023-11-27 NOTE — Progress Notes (Signed)
 Specialty Pharmacy Refill Coordination Note  [name] West is a 73 y.o. female contacted today regarding refills of specialty medication(s) Osimertinib  Mesylate (TAGRISSO )   Patient requested Delivery   Delivery date: 11/29/23   Verified address: [address] [place] [address]   Medication will be filled on 06.17.25.

## 2023-11-28 ENCOUNTER — Inpatient Hospital Stay: Attending: Internal Medicine

## 2023-11-28 ENCOUNTER — Inpatient Hospital Stay (HOSPITAL_BASED_OUTPATIENT_CLINIC_OR_DEPARTMENT_OTHER): Admitting: Internal Medicine

## 2023-11-28 ENCOUNTER — Other Ambulatory Visit (HOSPITAL_COMMUNITY): Payer: Self-pay

## 2023-11-28 ENCOUNTER — Other Ambulatory Visit: Payer: Self-pay | Admitting: Medical Oncology

## 2023-11-28 VITALS — BP 112/66 | HR 80 | Temp 97.8°F | Resp 16 | Ht 59.5 in | Wt 91.4 lb

## 2023-11-28 DIAGNOSIS — C3411 Malignant neoplasm of upper lobe, right bronchus or lung: Secondary | ICD-10-CM | POA: Diagnosis present

## 2023-11-28 DIAGNOSIS — Z91041 Radiographic dye allergy status: Secondary | ICD-10-CM

## 2023-11-28 DIAGNOSIS — Z79899 Other long term (current) drug therapy: Secondary | ICD-10-CM | POA: Insufficient documentation

## 2023-11-28 DIAGNOSIS — C3490 Malignant neoplasm of unspecified part of unspecified bronchus or lung: Secondary | ICD-10-CM

## 2023-11-28 LAB — CBC WITH DIFFERENTIAL (CANCER CENTER ONLY)
Abs Immature Granulocytes: 0.02 10*3/uL (ref 0.00–0.07)
Basophils Absolute: 0 10*3/uL (ref 0.0–0.1)
Basophils Relative: 1 %
Eosinophils Absolute: 0.1 10*3/uL (ref 0.0–0.5)
Eosinophils Relative: 1 %
HCT: 40.2 % (ref 36.0–46.0)
Hemoglobin: 13.5 g/dL (ref 12.0–15.0)
Immature Granulocytes: 0 %
Lymphocytes Relative: 19 %
Lymphs Abs: 1.4 10*3/uL (ref 0.7–4.0)
MCH: 30.6 pg (ref 26.0–34.0)
MCHC: 33.6 g/dL (ref 30.0–36.0)
MCV: 91.2 fL (ref 80.0–100.0)
Monocytes Absolute: 0.9 10*3/uL (ref 0.1–1.0)
Monocytes Relative: 13 %
Neutro Abs: 4.8 10*3/uL (ref 1.7–7.7)
Neutrophils Relative %: 66 %
Platelet Count: 178 10*3/uL (ref 150–400)
RBC: 4.41 MIL/uL (ref 3.87–5.11)
RDW: 13.2 % (ref 11.5–15.5)
WBC Count: 7.2 10*3/uL (ref 4.0–10.5)
nRBC: 0 % (ref 0.0–0.2)

## 2023-11-28 LAB — CMP (CANCER CENTER ONLY)
ALT: 17 U/L (ref 0–44)
AST: 25 U/L (ref 15–41)
Albumin: 4.5 g/dL (ref 3.5–5.0)
Alkaline Phosphatase: 43 U/L (ref 38–126)
Anion gap: 6 (ref 5–15)
BUN: 24 mg/dL — ABNORMAL HIGH (ref 8–23)
CO2: 31 mmol/L (ref 22–32)
Calcium: 9.4 mg/dL (ref 8.9–10.3)
Chloride: 101 mmol/L (ref 98–111)
Creatinine: 0.99 mg/dL (ref 0.44–1.00)
GFR, Estimated: 60 mL/min — ABNORMAL LOW (ref >60)
Glucose, Bld: 86 mg/dL (ref 70–99)
Potassium: 4.1 mmol/L (ref 3.5–5.1)
Sodium: 138 mmol/L (ref 135–145)
Total Bilirubin: 0.4 mg/dL (ref 0.0–1.2)
Total Protein: 6.7 g/dL (ref 6.5–8.1)

## 2023-11-28 MED ORDER — PREDNISONE 50 MG PO TABS
ORAL_TABLET | ORAL | 5 refills | Status: DC
Start: 2023-11-28 — End: 2024-02-19

## 2023-11-28 NOTE — Progress Notes (Signed)
 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  PRIOR THERAPY: 1) Right VATS with right upper lobectomy with bronchoplasty closure and en bloc resection of a wedge from the superior segment of the right lower lobe in addition to mediastinal lymph node dissection on 07/30/2015 under the care of Dr. Luna Salinas. 2) Adjuvant systemic chemotherapy with cisplatin  75 MG/M2 and Alimta  500 MG/M2 every 3 weeks. First dose was given on 09/10/2015. Status post 4 cycles. 3) adjuvant radiotherapy under the care of Dr. Lorri Rota.  CURRENT THERAPY: Tagrisso  80 mg p.o. daily started 12/01/2018.  Status post 60 months of treatment.  INTERVAL HISTORY: [name] West 73 y.o. female returns to the clinic today for 25-month follow-up visit. Discussed the use of AI scribe software for clinical note transcription with the patient, who gave verbal consent to proceed.  History of Present Illness   [name] West is a 73 year old female with recurrent non-small cell lung cancer who presents for evaluation and repeat blood work.  She has a history of recurrent non-small cell lung cancer, specifically adenocarcinoma with a positive EGFR mutation and exon 19 deletion. Initially diagnosed with stage IIIA in February 2017, she underwent a right upper lobectomy and wedge resection of the superior segment of the right lower lobe, followed by adjuvant chemotherapy with cisplatin  and Alimta , and adjuvant radiotherapy to the mediastinum.  In June 2020, she experienced disease progression and began  treatment with Tagrisso  (osimertinib ) at a dose of 80 mg orally once daily. She has been on this treatment for 60 months and feels well overall, stating, 'If I didn't have lung cancer, you wouldn't know it.'  No new symptoms since her last visit three months ago. She describes her life as 'boring,' which she considers positive. She continues to volunteer on Wednesdays, although she notes that during the summer, there are many volunteers and student nurses, which sometimes feels like duplicating services.  Her current medication regimen includes Tagrisso  80 mg daily. She mentioned there were a couple of changes in her medications, which she plans to update with the medical staff.         MEDICAL HISTORY: Past Medical History:  Diagnosis Date   Anxiety    Arthritis    shoulders   Cancer (HCC) 06/2014   Lung Cancer   Complication of anesthesia    nausea, does well with PROPOFOL     Cough 09/17/2015   Encounter for antineoplastic chemotherapy 10/22/2015   Environmental and seasonal allergies    Family history of adverse reaction to anesthesia    Patients grandmother died while having hand surgery; pt unsure of cause   Oral thrush 10/22/2015   Ovarian cyst    PONV (postoperative nausea and vomiting)    Raynaud disease     ALLERGIES:  is allergic to avelox [moxifloxacin hcl in nacl], cephalexin, cephalosporins, ciprofloxacin  hcl, clindamycin/lincomycin, shrimp [shellfish allergy], macrobid [nitrofurantoin monohyd macro], omnipaque  [iohexol ], azithromycin , fluconazole, moxifloxacin, adhesive [tape], lidoderm  [lidocaine ], pseudoephedrine, and sulfa antibiotics.  MEDICATIONS:  Current Outpatient Medications  Medication Sig Dispense Refill   cholecalciferol  (VITAMIN D ) 1000 UNITS tablet Take 1,000 Units by mouth  3 (three) times a week.     citalopram  (CELEXA ) 10 MG tablet Take 10 mg by mouth daily.     clobetasol ointment (TEMOVATE) 0.05 % 1 application     Clobetasol Prop Oint-Coal Tar  (CLOBETAPLUS OINTMENT EX) Apply 1 Dose topically daily.     EPINEPHrine 0.3 mg/0.3 mL IJ SOAJ injection Inject into the muscle as directed. (Patient not taking: Reported on 08/31/2023)     estradiol (ESTRACE) 0.1 MG/GM vaginal cream Place 1 Applicatorful vaginally 2 (two) times a week.     ibuprofen  (ADVIL ,MOTRIN ) 200 MG tablet Take 200 mg by mouth every 6 (six) hours as needed (pain). (Patient not taking: Reported on 08/31/2023)     levocetirizine (XYZAL) 5 MG tablet Take 2.5 mg by mouth every evening.     LORazepam  (ATIVAN ) 0.5 MG tablet Take 0.5 mg by mouth at bedtime as needed for anxiety or sleep.  (Patient not taking: Reported on 08/31/2023)     Multiple Vitamin (MULTIVITAMIN WITH MINERALS) TABS Take 1 tablet by mouth daily.     NONFORMULARY OR COMPOUNDED ITEM Place 1 application vaginally 2 (two) times a week. Estradiol 0.02% Cream     osimertinib  mesylate (TAGRISSO ) 80 MG tablet TAKE 1 TABLET (80 MG TOTAL) BY MOUTH DAILY. 60 tablet 2   polyethylene glycol powder (GLYCOLAX /MIRALAX ) 17 GM/SCOOP powder Take by mouth as needed.     predniSONE  (DELTASONE ) 50 MG tablet TAKE 1 TABLET BY MOUTH 13 HOURS PRIOR TO CT, 1 TABLET 7 HOURS PRIOR & 1 TABLET 1 HOUR PRIOR TO CT (Patient not taking: Reported on 08/31/2023) 3 tablet 3   Probiotic Product (ALIGN) 4 MG CAPS Take 1 capsule by mouth daily.     rosuvastatin  (CRESTOR ) 5 MG tablet TAKE 1 TABLET (5 MG TOTAL) BY MOUTH DAILY. 90 tablet 3   Wheat Dextrin (BENEFIBER) POWD Take by mouth daily at 6 (six) AM.     No current facility-administered medications for this visit.    SURGICAL HISTORY:  Past Surgical History:  Procedure Laterality Date   BREAST EXCISIONAL BIOPSY Left    1996   CESAREAN SECTION  1984   COLONOSCOPY WITH PROPOFOL  N/A 09/27/2012   Procedure: COLONOSCOPY WITH PROPOFOL ;  Surgeon: Garrett Kallman, MD;  Location: [facility];  Service: Endoscopy;  Laterality: N/A;   Fibroid Removed     LAPAROTOMY Left 07/20/2016   Procedure: MINI  LAPAROTOMY, REMOVAL OF LEFT OVARIAN CYST, LEFT SALPINGO-OOPHORECTOMY;  Surgeon: Thurman Flores, MD;  Location: [facility];  Service: Gynecology;  Laterality: Left;  please moved to 7:30am if spot opens   Right Rotator Cuff Right    TOTAL SHOULDER ARTHROPLASTY Left 06/14/2018   Procedure: TOTAL SHOULDER ARTHROPLASTY;  Surgeon: Ellard Gunning, MD;  Location: [facility];  Service: Orthopedics;  Laterality: Left;    VIDEO ASSISTED THORACOSCOPY (VATS)/ LOBECTOMY Right 07/30/2015   Procedure: RIGHT VIDEO ASSISTED THORACOSCOPY (VATS)/RIGHT UPPER LOBECTOMY;  Surgeon: Zelphia Higashi, MD;  Location: [facility];  Service: Thoracic;  Laterality: Right;   VIDEO BRONCHOSCOPY WITH ENDOBRONCHIAL NAVIGATION N/A 07/15/2015   Procedure: VIDEO BRONCHOSCOPY WITH ENDOBRONCHIAL NAVIGATION with Biopsy;  Surgeon: Denson Flake, MD;  Location: [facility];  Service: Thoracic;  Laterality: N/A;    REVIEW OF SYSTEMS:  A comprehensive review of systems was negative.   PHYSICAL EXAMINATION: General appearance: alert, cooperative, and no distress Head: Normocephalic, without obvious abnormality, atraumatic Neck: no adenopathy, no JVD, supple, symmetrical, trachea midline, and thyroid  not enlarged, symmetric, no tenderness/mass/nodules Lymph nodes: Cervical, supraclavicular, and axillary  nodes normal. Resp: clear to auscultation bilaterally Back: symmetric, no curvature. ROM normal. No CVA tenderness. Cardio: regular rate and rhythm, S1, S2 normal, no murmur, click, rub or gallop GI: soft, non-tender; bowel sounds normal; no masses,  no organomegaly Extremities: extremities normal, atraumatic, no cyanosis or edema  ECOG PERFORMANCE STATUS: 0 - Asymptomatic  Blood pressure 112/66, pulse 80, temperature 97.8 F (36.6 C), temperature source Oral, resp. rate 16, height 4' 11.5 (1.511 m), weight 91 lb 6.4 oz (41.5 kg), SpO2 100%.   LABORATORY DATA: Lab Results  Component Value Date   WBC 7.2 11/28/2023   HGB 13.5 11/28/2023   HCT  40.2 11/28/2023   MCV 91.2 11/28/2023   PLT 178 11/28/2023      Chemistry      Component Value Date/Time   NA 135 08/21/2023 0911   NA 139 06/16/2017 1132   K 4.3 08/21/2023 0911   K 4.7 06/16/2017 1132   CL 99 08/21/2023 0911   CO2 27 08/21/2023 0911   CO2 28 06/16/2017 1132   BUN 28 (H) 08/21/2023 0911   BUN 19.5 06/16/2017 1132   CREATININE 1.07 (H) 08/21/2023 0911   CREATININE 0.8 06/16/2017 1132      Component Value Date/Time   CALCIUM  9.4 08/21/2023 0911   CALCIUM  10.0 06/16/2017 1132   ALKPHOS 57 08/21/2023 0911   ALKPHOS 68 06/16/2017 1132   AST 25 08/21/2023 0911   AST 21 06/16/2017 1132   ALT 21 08/21/2023 0911   ALT 18 06/16/2017 1132   BILITOT 0.4 08/21/2023 0911   BILITOT 0.50 06/16/2017 1132       RADIOGRAPHIC STUDIES: No results found.   ASSESSMENT AND PLAN:  This is a very pleasant 73 years old white female with recurrent non-small cell lung cancer initially diagnosed as stage IIIA non-small cell lung cancer, adenocarcinoma with positive EGFR mutation in exon 19 status post right upper lobectomy with lymph node dissection followed by adjuvant systemic chemotherapy as well as adjuvant radiation. The patient has been on observation for almost 3 years.   Unfortunately her scan in June 2020 showed multiple tiny pulmonary nodules scattered throughout both lungs which many of them have increased in size and some new nodule suspected the previous scan.  The patient started treatment with Tagrisso  80 mg p.o. daily status post 60 months of treatment.   She has been tolerating this treatment fairly well with no significant adverse effects. Assessment and Plan    Recurrent non-small cell lung cancer with EGFR mutation Recurrent non-small cell lung cancer, adenocarcinoma with positive EGFR mutation and exon 19 deletion. Initially diagnosed as stage IIIA in February 2017. She underwent right upper lobectomy and wedge resection of the superior segment of the right  lower lobe, followed by adjuvant chemotherapy with cisplatin  and pemetrexed , and adjuvant radiotherapy to the mediastinum. Disease progression occurred in June 2020, and she was started on osimertinib  80 mg PO daily. Currently, 60 months post-treatment with osimertinib , she is showing progression-free survival beyond median expectations. Recent study results indicate a median overall survival of 38 months and median progression-free survival of 19 months for osimertinib , but individual variability and her favorable response were emphasized. She reports no new symptoms, and CBC results are normal. - Continue osimertinib  80 mg PO daily - Order imaging one week prior to next appointment - Schedule follow-up appointment in three months   She was advised to call immediately if she has any other concerning symptoms in the interval. The patient voices understanding of  current disease status and treatment options and is in agreement with the current care plan. All questions were answered. The patient knows to call the clinic with any problems, questions or concerns. We can certainly see the patient much sooner if necessary. The total time spent in the appointment was 20 minutes.  Disclaimer: This note was dictated with voice recognition software. Similar sounding words can inadvertently be transcribed and may not be corrected upon review.

## 2023-12-19 ENCOUNTER — Other Ambulatory Visit: Payer: Self-pay

## 2023-12-19 NOTE — Progress Notes (Signed)
 Specialty Pharmacy Refill Coordination Note  [name] West is a 73 y.o. female contacted today regarding refills of specialty medication(s) Osimertinib  Mesylate (TAGRISSO )   Patient requested Delivery   Delivery date: 01/01/24   Verified address: [address] [place] [address]   Medication will be filled on 12/29/23.

## 2023-12-19 NOTE — Progress Notes (Signed)
 Specialty Pharmacy Ongoing Clinical Assessment Note  [name] West is a 73 y.o. female who is being followed by the specialty pharmacy service for RxSp Oncology   Patient's specialty medication(s) reviewed today: Osimertinib  Mesylate (TAGRISSO )   Missed doses in the last 4 weeks: 0   Patient/Caregiver did not have any additional questions or concerns.   Therapeutic benefit summary: Patient is achieving benefit   Adverse events/side effects summary: Experienced adverse events/side effects (brittle nails, tolerable at this time)   Patient's therapy is appropriate to: Continue    Goals Addressed             This Visit's Progress    Slow Disease Progression   On track    Patient is on track. Patient will maintain adherence. Patient remains stable at this time.          Follow up: 6 months  [facility]

## 2023-12-21 DIAGNOSIS — R262 Difficulty in walking, not elsewhere classified: Secondary | ICD-10-CM | POA: Diagnosis not present

## 2023-12-21 DIAGNOSIS — M545 Low back pain, unspecified: Secondary | ICD-10-CM | POA: Diagnosis not present

## 2023-12-29 ENCOUNTER — Other Ambulatory Visit: Payer: Self-pay

## 2024-01-01 DIAGNOSIS — M545 Low back pain, unspecified: Secondary | ICD-10-CM | POA: Diagnosis not present

## 2024-01-01 DIAGNOSIS — R262 Difficulty in walking, not elsewhere classified: Secondary | ICD-10-CM | POA: Diagnosis not present

## 2024-01-03 DIAGNOSIS — M7062 Trochanteric bursitis, left hip: Secondary | ICD-10-CM | POA: Diagnosis not present

## 2024-01-03 DIAGNOSIS — M25552 Pain in left hip: Secondary | ICD-10-CM | POA: Diagnosis not present

## 2024-01-03 DIAGNOSIS — R262 Difficulty in walking, not elsewhere classified: Secondary | ICD-10-CM | POA: Diagnosis not present

## 2024-01-03 DIAGNOSIS — M545 Low back pain, unspecified: Secondary | ICD-10-CM | POA: Diagnosis not present

## 2024-01-03 DIAGNOSIS — M7061 Trochanteric bursitis, right hip: Secondary | ICD-10-CM | POA: Diagnosis not present

## 2024-01-03 DIAGNOSIS — M25551 Pain in right hip: Secondary | ICD-10-CM | POA: Diagnosis not present

## 2024-01-04 DIAGNOSIS — R262 Difficulty in walking, not elsewhere classified: Secondary | ICD-10-CM | POA: Diagnosis not present

## 2024-01-04 DIAGNOSIS — M545 Low back pain, unspecified: Secondary | ICD-10-CM | POA: Diagnosis not present

## 2024-01-08 DIAGNOSIS — R262 Difficulty in walking, not elsewhere classified: Secondary | ICD-10-CM | POA: Diagnosis not present

## 2024-01-08 DIAGNOSIS — M545 Low back pain, unspecified: Secondary | ICD-10-CM | POA: Diagnosis not present

## 2024-01-09 ENCOUNTER — Other Ambulatory Visit: Payer: Self-pay

## 2024-01-09 DIAGNOSIS — M7061 Trochanteric bursitis, right hip: Secondary | ICD-10-CM | POA: Diagnosis not present

## 2024-01-09 DIAGNOSIS — M7062 Trochanteric bursitis, left hip: Secondary | ICD-10-CM | POA: Diagnosis not present

## 2024-01-09 DIAGNOSIS — Z681 Body mass index (BMI) 19 or less, adult: Secondary | ICD-10-CM | POA: Diagnosis not present

## 2024-01-09 DIAGNOSIS — M25562 Pain in left knee: Secondary | ICD-10-CM | POA: Diagnosis not present

## 2024-01-09 DIAGNOSIS — R252 Cramp and spasm: Secondary | ICD-10-CM | POA: Diagnosis not present

## 2024-01-11 ENCOUNTER — Other Ambulatory Visit: Payer: Self-pay | Admitting: Internal Medicine

## 2024-01-12 DIAGNOSIS — R262 Difficulty in walking, not elsewhere classified: Secondary | ICD-10-CM | POA: Diagnosis not present

## 2024-01-12 DIAGNOSIS — M545 Low back pain, unspecified: Secondary | ICD-10-CM | POA: Diagnosis not present

## 2024-01-16 ENCOUNTER — Encounter (INDEPENDENT_AMBULATORY_CARE_PROVIDER_SITE_OTHER): Payer: Self-pay

## 2024-01-16 ENCOUNTER — Other Ambulatory Visit: Payer: Self-pay | Admitting: Pharmacy Technician

## 2024-01-16 ENCOUNTER — Other Ambulatory Visit: Payer: Self-pay

## 2024-01-16 DIAGNOSIS — M545 Low back pain, unspecified: Secondary | ICD-10-CM | POA: Diagnosis not present

## 2024-01-16 DIAGNOSIS — R262 Difficulty in walking, not elsewhere classified: Secondary | ICD-10-CM | POA: Diagnosis not present

## 2024-01-16 NOTE — Progress Notes (Signed)
 Specialty Pharmacy Refill Coordination Note  [name] West is a 73 y.o. female contacted today regarding refills of specialty medication(s) Osimertinib  Mesylate (TAGRISSO )   Patient requested (Patient-Rptd) Delivery   Delivery date: 01/30/24 Verified address: (Patient-Rptd) [phone] [place]   Medication will be filled on 01/29/24.

## 2024-01-19 DIAGNOSIS — M545 Low back pain, unspecified: Secondary | ICD-10-CM | POA: Diagnosis not present

## 2024-01-19 DIAGNOSIS — R262 Difficulty in walking, not elsewhere classified: Secondary | ICD-10-CM | POA: Diagnosis not present

## 2024-01-22 ENCOUNTER — Encounter: Payer: Self-pay | Admitting: Internal Medicine

## 2024-01-23 ENCOUNTER — Other Ambulatory Visit: Payer: Self-pay

## 2024-01-23 DIAGNOSIS — M545 Low back pain, unspecified: Secondary | ICD-10-CM | POA: Diagnosis not present

## 2024-01-23 DIAGNOSIS — R262 Difficulty in walking, not elsewhere classified: Secondary | ICD-10-CM | POA: Diagnosis not present

## 2024-01-25 DIAGNOSIS — M545 Low back pain, unspecified: Secondary | ICD-10-CM | POA: Diagnosis not present

## 2024-01-25 DIAGNOSIS — R262 Difficulty in walking, not elsewhere classified: Secondary | ICD-10-CM | POA: Diagnosis not present

## 2024-01-29 ENCOUNTER — Other Ambulatory Visit: Payer: Self-pay

## 2024-01-30 DIAGNOSIS — R262 Difficulty in walking, not elsewhere classified: Secondary | ICD-10-CM | POA: Diagnosis not present

## 2024-01-30 DIAGNOSIS — M545 Low back pain, unspecified: Secondary | ICD-10-CM | POA: Diagnosis not present

## 2024-02-02 DIAGNOSIS — R262 Difficulty in walking, not elsewhere classified: Secondary | ICD-10-CM | POA: Diagnosis not present

## 2024-02-02 DIAGNOSIS — M545 Low back pain, unspecified: Secondary | ICD-10-CM | POA: Diagnosis not present

## 2024-02-05 DIAGNOSIS — R262 Difficulty in walking, not elsewhere classified: Secondary | ICD-10-CM | POA: Diagnosis not present

## 2024-02-05 DIAGNOSIS — M545 Low back pain, unspecified: Secondary | ICD-10-CM | POA: Diagnosis not present

## 2024-02-07 DIAGNOSIS — R262 Difficulty in walking, not elsewhere classified: Secondary | ICD-10-CM | POA: Diagnosis not present

## 2024-02-07 DIAGNOSIS — M545 Low back pain, unspecified: Secondary | ICD-10-CM | POA: Diagnosis not present

## 2024-02-14 ENCOUNTER — Other Ambulatory Visit: Payer: Self-pay | Admitting: Family Medicine

## 2024-02-14 DIAGNOSIS — R262 Difficulty in walking, not elsewhere classified: Secondary | ICD-10-CM | POA: Diagnosis not present

## 2024-02-14 DIAGNOSIS — Z1231 Encounter for screening mammogram for malignant neoplasm of breast: Secondary | ICD-10-CM

## 2024-02-14 DIAGNOSIS — M545 Low back pain, unspecified: Secondary | ICD-10-CM | POA: Diagnosis not present

## 2024-02-15 DIAGNOSIS — M545 Low back pain, unspecified: Secondary | ICD-10-CM | POA: Diagnosis not present

## 2024-02-15 DIAGNOSIS — R262 Difficulty in walking, not elsewhere classified: Secondary | ICD-10-CM | POA: Diagnosis not present

## 2024-02-19 ENCOUNTER — Encounter (HOSPITAL_COMMUNITY): Payer: Self-pay

## 2024-02-19 ENCOUNTER — Inpatient Hospital Stay: Attending: Internal Medicine

## 2024-02-19 ENCOUNTER — Other Ambulatory Visit: Payer: Self-pay | Admitting: Internal Medicine

## 2024-02-19 ENCOUNTER — Ambulatory Visit (HOSPITAL_COMMUNITY)

## 2024-02-19 DIAGNOSIS — M7071 Other bursitis of hip, right hip: Secondary | ICD-10-CM | POA: Diagnosis not present

## 2024-02-19 DIAGNOSIS — M7918 Myalgia, other site: Secondary | ICD-10-CM | POA: Diagnosis not present

## 2024-02-19 DIAGNOSIS — K5909 Other constipation: Secondary | ICD-10-CM | POA: Insufficient documentation

## 2024-02-19 DIAGNOSIS — Z79899 Other long term (current) drug therapy: Secondary | ICD-10-CM | POA: Insufficient documentation

## 2024-02-19 DIAGNOSIS — R918 Other nonspecific abnormal finding of lung field: Secondary | ICD-10-CM | POA: Diagnosis not present

## 2024-02-19 DIAGNOSIS — M7072 Other bursitis of hip, left hip: Secondary | ICD-10-CM | POA: Insufficient documentation

## 2024-02-19 DIAGNOSIS — E7800 Pure hypercholesterolemia, unspecified: Secondary | ICD-10-CM | POA: Insufficient documentation

## 2024-02-19 DIAGNOSIS — Z902 Acquired absence of lung [part of]: Secondary | ICD-10-CM | POA: Diagnosis not present

## 2024-02-19 DIAGNOSIS — Z91041 Radiographic dye allergy status: Secondary | ICD-10-CM

## 2024-02-19 DIAGNOSIS — C3411 Malignant neoplasm of upper lobe, right bronchus or lung: Secondary | ICD-10-CM | POA: Diagnosis present

## 2024-02-19 DIAGNOSIS — C3490 Malignant neoplasm of unspecified part of unspecified bronchus or lung: Secondary | ICD-10-CM

## 2024-02-19 LAB — CMP (CANCER CENTER ONLY)
ALT: 18 U/L (ref 0–44)
AST: 24 U/L (ref 15–41)
Albumin: 4.4 g/dL (ref 3.5–5.0)
Alkaline Phosphatase: 41 U/L (ref 38–126)
Anion gap: 4 — ABNORMAL LOW (ref 5–15)
BUN: 23 mg/dL (ref 8–23)
CO2: 31 mmol/L (ref 22–32)
Calcium: 9.3 mg/dL (ref 8.9–10.3)
Chloride: 102 mmol/L (ref 98–111)
Creatinine: 0.88 mg/dL (ref 0.44–1.00)
GFR, Estimated: >60 mL/min (ref >60)
Glucose, Bld: 95 mg/dL (ref 70–99)
Potassium: 4.4 mmol/L (ref 3.5–5.1)
Sodium: 137 mmol/L (ref 135–145)
Total Bilirubin: 0.5 mg/dL (ref 0.0–1.2)
Total Protein: 6.5 g/dL (ref 6.5–8.1)

## 2024-02-19 LAB — CBC WITH DIFFERENTIAL (CANCER CENTER ONLY)
Abs Immature Granulocytes: 0.01 K/uL (ref 0.00–0.07)
Basophils Absolute: 0 K/uL (ref 0.0–0.1)
Basophils Relative: 1 %
Eosinophils Absolute: 0.1 K/uL (ref 0.0–0.5)
Eosinophils Relative: 1 %
HCT: 39.8 % (ref 36.0–46.0)
Hemoglobin: 13.2 g/dL (ref 12.0–15.0)
Immature Granulocytes: 0 %
Lymphocytes Relative: 17 %
Lymphs Abs: 1.1 K/uL (ref 0.7–4.0)
MCH: 30.7 pg (ref 26.0–34.0)
MCHC: 33.2 g/dL (ref 30.0–36.0)
MCV: 92.6 fL (ref 80.0–100.0)
Monocytes Absolute: 1 K/uL (ref 0.1–1.0)
Monocytes Relative: 15 %
Neutro Abs: 4.4 K/uL (ref 1.7–7.7)
Neutrophils Relative %: 66 %
Platelet Count: 185 K/uL (ref 150–400)
RBC: 4.3 MIL/uL (ref 3.87–5.11)
RDW: 13.3 % (ref 11.5–15.5)
WBC Count: 6.5 K/uL (ref 4.0–10.5)
nRBC: 0 % (ref 0.0–0.2)

## 2024-02-19 MED ORDER — PREDNISONE 50 MG PO TABS: ORAL_TABLET | ORAL | 5 refills | Status: AC

## 2024-02-21 ENCOUNTER — Other Ambulatory Visit (HOSPITAL_COMMUNITY): Payer: Self-pay

## 2024-02-21 ENCOUNTER — Ambulatory Visit (HOSPITAL_BASED_OUTPATIENT_CLINIC_OR_DEPARTMENT_OTHER)
Admission: RE | Admit: 2024-02-21 | Discharge: 2024-02-21 | Disposition: A | Discharge status: Home / Self Care | Source: Ambulatory Visit | Attending: Internal Medicine | Admitting: Internal Medicine

## 2024-02-21 DIAGNOSIS — C3490 Malignant neoplasm of unspecified part of unspecified bronchus or lung: Secondary | ICD-10-CM | POA: Diagnosis not present

## 2024-02-21 DIAGNOSIS — R918 Other nonspecific abnormal finding of lung field: Secondary | ICD-10-CM | POA: Diagnosis not present

## 2024-02-21 MED ORDER — IOHEXOL 300 MG/ML  SOLN
70.0000 mL | Freq: Once | INTRAMUSCULAR | Status: AC | PRN
  Administered 2024-02-21: 70 mL via INTRAVENOUS

## 2024-02-26 ENCOUNTER — Inpatient Hospital Stay (HOSPITAL_BASED_OUTPATIENT_CLINIC_OR_DEPARTMENT_OTHER): Admitting: Internal Medicine

## 2024-02-26 VITALS — BP 117/71 | HR 84 | Temp 97.2°F | Resp 17 | Ht 59.0 in | Wt 91.1 lb

## 2024-02-26 DIAGNOSIS — M7072 Other bursitis of hip, left hip: Secondary | ICD-10-CM | POA: Diagnosis not present

## 2024-02-26 DIAGNOSIS — E7800 Pure hypercholesterolemia, unspecified: Secondary | ICD-10-CM | POA: Diagnosis not present

## 2024-02-26 DIAGNOSIS — C3491 Malignant neoplasm of unspecified part of right bronchus or lung: Secondary | ICD-10-CM

## 2024-02-26 DIAGNOSIS — C3411 Malignant neoplasm of upper lobe, right bronchus or lung: Secondary | ICD-10-CM | POA: Diagnosis not present

## 2024-02-26 DIAGNOSIS — M7918 Myalgia, other site: Secondary | ICD-10-CM | POA: Diagnosis not present

## 2024-02-26 DIAGNOSIS — R918 Other nonspecific abnormal finding of lung field: Secondary | ICD-10-CM | POA: Diagnosis not present

## 2024-02-26 DIAGNOSIS — M7071 Other bursitis of hip, right hip: Secondary | ICD-10-CM | POA: Diagnosis not present

## 2024-02-26 NOTE — Progress Notes (Signed)
 [facility] [phone]   [phone]  OFFICE PROGRESS NOTE  [name] Harvey, MD [address] 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  PRIOR THERAPY: 1) Right VATS with right upper lobectomy with bronchoplasty closure and en bloc resection of a wedge from the superior segment of the right lower lobe in addition to mediastinal lymph node dissection on 07/30/2015 under the care of Dr. Kerrin. 2) Adjuvant systemic chemotherapy with cisplatin  75 MG/M2 and Alimta  500 MG/M2 every 3 weeks. First dose was given on 09/10/2015. Status post 4 cycles. 3) adjuvant radiotherapy under the care of Dr. Patrcia.  CURRENT THERAPY: Tagrisso  80 mg p.o. daily started 12/01/2018.  Status post 63 months of treatment.  INTERVAL HISTORY: [name] West 73 y.o. female returns to the clinic today for 47-month follow-up visit. Discussed the use of AI scribe software for clinical note transcription with the patient, who gave verbal consent to proceed.  History of Present Illness [name] West is a 73 year old female with recurrent non-small cell lung cancer who presents for evaluation with repeat CT scan for restaging of her disease.  She was diagnosed with non-small cell lung cancer, adenocarcinoma, in February 2017, with a positive EGFR mutation with deletion in exon 19. She has been on Tagrisso  80 mg PO daily since June 2020, marking 63 months of treatment.  She visited urgent care due to bloating, which was attributed to her usual constipation. This episode led to a series of symptoms including bilateral hip bursitis and leg pain. She is concerned about potential bone  involvement.  She experiences persistent calf pain, which she questions might be related to her medication, though it is not a common side effect. She has been active, including playing pickleball, which preceded the onset of symptoms. She uses Tylenol  or ibuprofen  occasionally, but finds them ineffective.  She has a history of high cholesterol and is currently on Crestor . Recent imaging noted mild hepatic steatosis and a prominent right renal pelvis. She inquires about the possibility of these findings affecting her hip pain.  She discusses her physical therapy experiences, noting that her providers are hesitant to use certain modalities due to her cancer history. She has tried dry needling, which she describes as painful but potentially beneficial.    MEDICAL HISTORY: Past Medical History:  Diagnosis Date   Anxiety    Arthritis    shoulders   Cancer (HCC) 06/2014   Lung Cancer   Complication of anesthesia    nausea, does well with PROPOFOL     Cough 09/17/2015   Encounter for antineoplastic chemotherapy 10/22/2015   Environmental and seasonal allergies    Family history of adverse reaction to anesthesia    Patients grandmother died while having hand surgery; pt unsure of cause   Oral thrush 10/22/2015   Ovarian cyst    PONV (postoperative nausea and vomiting)    Raynaud disease     ALLERGIES:  is allergic to avelox [moxifloxacin hcl in nacl], cephalexin, cephalosporins, ciprofloxacin  hcl, clindamycin/lincomycin, shrimp [shellfish allergy], macrobid [nitrofurantoin monohyd macro], omnipaque  [iohexol ], azithromycin , fluconazole, moxifloxacin, adhesive [tape], lidoderm  [lidocaine ], pseudoephedrine, and sulfa antibiotics.  MEDICATIONS:  Current Outpatient Medications  Medication Sig Dispense Refill   cholecalciferol  (VITAMIN D ) 1000  UNITS tablet Take 1,000 Units by mouth 3 (three) times a week.     citalopram  (CELEXA ) 10 MG tablet Take 10 mg by mouth daily.     clobetasol ointment  (TEMOVATE) 0.05 % 1 application     Clobetasol Prop Oint-Coal Tar (CLOBETAPLUS OINTMENT EX) Apply 1 Dose topically daily.     EPINEPHrine 0.3 mg/0.3 mL IJ SOAJ injection Inject into the muscle as directed. (Patient not taking: Reported on 08/31/2023)     estradiol (ESTRACE) 0.1 MG/GM vaginal cream Place 1 Applicatorful vaginally 2 (two) times a week.     ibuprofen  (ADVIL ,MOTRIN ) 200 MG tablet Take 200 mg by mouth every 6 (six) hours as needed (pain). (Patient not taking: Reported on 08/31/2023)     levocetirizine (XYZAL) 5 MG tablet Take 2.5 mg by mouth every evening.     LORazepam  (ATIVAN ) 0.5 MG tablet Take 0.5 mg by mouth at bedtime as needed for anxiety or sleep.  (Patient not taking: Reported on 08/31/2023)     Multiple Vitamin (MULTIVITAMIN WITH MINERALS) TABS Take 1 tablet by mouth daily.     NONFORMULARY OR COMPOUNDED ITEM Place 1 application vaginally 2 (two) times a week. Estradiol 0.02% Cream     osimertinib  mesylate (TAGRISSO ) 80 MG tablet TAKE 1 TABLET (80 MG TOTAL) BY MOUTH DAILY. 60 tablet 2   polyethylene glycol powder (GLYCOLAX /MIRALAX ) 17 GM/SCOOP powder Take by mouth as needed.     predniSONE  (DELTASONE ) 50 MG tablet TAKE 1 TABLET BY MOUTH 13 HOURS PRIOR TO CT, 1 TABLET 7 HOURS PRIOR & 1 TABLET 1 HOUR PRIOR TO CT 3 tablet 5   Probiotic Product (ALIGN) 4 MG CAPS Take 1 capsule by mouth daily.     rosuvastatin  (CRESTOR ) 5 MG tablet TAKE 1 TABLET (5 MG TOTAL) BY MOUTH DAILY. 90 tablet 3   Wheat Dextrin (BENEFIBER) POWD Take by mouth daily at 6 (six) AM.     No current facility-administered medications for this visit.    SURGICAL HISTORY:  Past Surgical History:  Procedure Laterality Date   BREAST EXCISIONAL BIOPSY Left    1996   CESAREAN SECTION  1984   COLONOSCOPY WITH PROPOFOL  N/A 09/27/2012   Procedure: COLONOSCOPY WITH PROPOFOL ;  Surgeon: Gladis MARLA Louder, MD;  Location: [facility];  Service: Endoscopy;  Laterality: N/A;   Fibroid Removed     LAPAROTOMY Left 07/20/2016    Procedure: MINI LAPAROTOMY, REMOVAL OF LEFT OVARIAN CYST, LEFT SALPINGO-OOPHORECTOMY;  Surgeon: Rosaline Cobble, MD;  Location: [facility];  Service: Gynecology;  Laterality: Left;  please moved to 7:30am if spot opens   Right Rotator Cuff Right    TOTAL SHOULDER ARTHROPLASTY Left 06/14/2018   Procedure: TOTAL SHOULDER ARTHROPLASTY;  Surgeon: Melita Drivers, MD;  Location: [facility];  Service: Orthopedics;  Laterality: Left;    VIDEO ASSISTED THORACOSCOPY (VATS)/ LOBECTOMY Right 07/30/2015   Procedure: RIGHT VIDEO ASSISTED THORACOSCOPY (VATS)/RIGHT UPPER LOBECTOMY;  Surgeon: Elspeth JAYSON Millers, MD;  Location: [facility];  Service: Thoracic;  Laterality: Right;   VIDEO BRONCHOSCOPY WITH ENDOBRONCHIAL NAVIGATION N/A 07/15/2015   Procedure: VIDEO BRONCHOSCOPY WITH ENDOBRONCHIAL NAVIGATION with Biopsy;  Surgeon: Lamar GORMAN Chris, MD;  Location: [facility];  Service: Thoracic;  Laterality: N/A;    REVIEW OF SYSTEMS:  Constitutional: negative Eyes: negative Ears, nose, mouth, throat, and face: negative Respiratory: negative Cardiovascular: negative Gastrointestinal: negative Genitourinary:negative Integument/breast: negative Hematologic/lymphatic: negative Musculoskeletal:positive for arthralgias Neurological: positive for paresthesia Behavioral/Psych: negative Endocrine: negative Allergic/Immunologic: negative   PHYSICAL EXAMINATION: General appearance: alert, cooperative, and no  distress Head: Normocephalic, without obvious abnormality, atraumatic Neck: no adenopathy, no JVD, supple, symmetrical, trachea midline, and thyroid  not enlarged, symmetric, no tenderness/mass/nodules Lymph nodes: Cervical, supraclavicular, and axillary nodes normal. Resp: clear to auscultation bilaterally Back: symmetric, no curvature. ROM normal. No CVA tenderness. Cardio: regular rate and rhythm, S1, S2 normal, no murmur, click, rub or gallop GI: soft, non-tender; bowel sounds normal; no masses,  no organomegaly Extremities:  extremities normal, atraumatic, no cyanosis or edema Neurologic: Alert and oriented X 3, normal strength and tone. Normal symmetric reflexes. Normal coordination and gait  ECOG PERFORMANCE STATUS: 0 - Asymptomatic  Blood pressure 117/71, pulse 84, temperature (!) 97.2 F (36.2 C), resp. rate 17, height 4' 11 (1.499 m), weight 91 lb 1.6 oz (41.3 kg), SpO2 98%.   LABORATORY DATA: Lab Results  Component Value Date   WBC 6.5 02/19/2024   HGB 13.2 02/19/2024   HCT 39.8 02/19/2024   MCV 92.6 02/19/2024   PLT 185 02/19/2024      Chemistry      Component Value Date/Time   NA 137 02/19/2024 0942   NA 139 06/16/2017 1132   K 4.4 02/19/2024 0942   K 4.7 06/16/2017 1132   CL 102 02/19/2024 0942   CO2 31 02/19/2024 0942   CO2 28 06/16/2017 1132   BUN 23 02/19/2024 0942   BUN 19.5 06/16/2017 1132   CREATININE 0.88 02/19/2024 0942   CREATININE 0.8 06/16/2017 1132      Component Value Date/Time   CALCIUM  9.3 02/19/2024 0942   CALCIUM  10.0 06/16/2017 1132   ALKPHOS 41 02/19/2024 0942   ALKPHOS 68 06/16/2017 1132   AST 24 02/19/2024 0942   AST 21 06/16/2017 1132   ALT 18 02/19/2024 0942   ALT 18 06/16/2017 1132   BILITOT 0.5 02/19/2024 0942   BILITOT 0.50 06/16/2017 1132       RADIOGRAPHIC STUDIES: CT CHEST ABDOMEN PELVIS W CONTRAST Result Date: 02/23/2024 CLINICAL DATA:  Non-small cell lung cancer staging EXAM: CT CHEST, ABDOMEN, AND PELVIS WITH CONTRAST TECHNIQUE: Multidetector CT imaging of the chest, abdomen and pelvis was performed following the standard protocol during bolus administration of intravenous contrast. RADIATION DOSE REDUCTION: This exam was performed according to the departmental dose-optimization program which includes automated exposure control, adjustment of the mA and/or kV according to patient size and/or use of iterative reconstruction technique. CONTRAST:  70mL OMNIPAQUE  IOHEXOL  300 MG/ML  SOLN COMPARISON:  CT chest abdomen pelvis August 21, 2023 chest CT  May 30, 2016 FINDINGS: CT CHEST FINDINGS Cardiovascular: The heart size is normal. No pericardial effusion. Atherosclerotic calcifications of coronary arteries. Mediastinum/Nodes: No suspicious mediastinal lymphadenopathy. Lungs/Pleura: Right upper lobectomy without suspicious finding at the surgical stump to suggest recurrent malignancy. Expected posttreatment changes in right paramediastinal. Multiple pulmonary nodules are grossly stable to prior. The index nodules in subpleural right lower lobe measure 6 and 5 mm (302/61, 50). These nodules are stable to prior and minimally enlarged to 2017 which measured 5 and 4 mm respectively. Additional scattered bilateral pulmonary nodules up to 4 mm are stable to prior for example in left upper lobe (302/31, 24, 53), right lower lobe (302/29). No new nodules identified. No pleural effusion. Musculoskeletal: No suspicious osseous lesion. CT ABDOMEN PELVIS FINDINGS Hepatobiliary:Mild hepatic steatosis. No focal liver abnormality is seen. No gallstones, gallbladder wall thickening, or biliary dilatation. Pancreas: Unremarkable. No pancreatic ductal dilatation or surrounding inflammatory changes. Spleen: Normal in size without focal abnormality. Adrenals/Urinary Tract: Adrenal glands are unremarkable. Kidneys are normal, without renal  calculi, focal lesion, or hydronephrosis. Bladder is unremarkable. Prominent extrarenal right renal pelvis. Stomach/Bowel: Stomach is within normal limits. No evidence of bowel wall thickening, distention, or inflammatory changes. Large stool throughout the colon. Vascular/Lymphatic: Atherosclerotic calcifications of aorta. No are present. No enlarged abdominal or pelvic lymph nodes. Reproductive: Postmenopausal uterus and bilateral adnexa are unremarkable. Other: No abdominal wall hernia or abnormality. No abdominopelvic ascites. Musculoskeletal: No acute or significant osseous findings. Multilevel degenerative changes of the spine. Status  post left shoulder arthroplasty. IMPRESSION: Right upper lobectomy with expected posttreatment changes in right paramediastinal without suspicious findings to suggest local recurrence at the surgical stump. Stable multiple bilateral pulmonary nodules. Follow-up according to oncologic protocols. No new distant or nodal metastasis. Electronically Signed   By: Megan  Zare M.D.   On: 02/23/2024 14:09     ASSESSMENT AND PLAN:  This is a very pleasant 73 years old white female with recurrent non-small cell lung cancer initially diagnosed as stage IIIA non-small cell lung cancer, adenocarcinoma with positive EGFR mutation in exon 19 status post right upper lobectomy with lymph node dissection followed by adjuvant systemic chemotherapy as well as adjuvant radiation. The patient has been on observation for almost 3 years.   Unfortunately her scan in June 2020 showed multiple tiny pulmonary nodules scattered throughout both lungs which many of them have increased in size and some new nodule suspected the previous scan.  The patient started treatment with Tagrisso  80 mg p.o. daily status post 63 months of treatment.   The patient has been tolerating this treatment fairly well with no concerning adverse effects. She had repeat CT scan of the chest, abdomen and pelvis performed recently.  I personally and independently reviewed the scan and discussed the results with the patient today. Assessment and Plan Assessment & Plan EGFR-mutated non-small cell lung adenocarcinoma on active therapy Recurrent non-small cell lung adenocarcinoma with positive EGFR mutation (exon 19 deletion). Currently on Tagrisso  80 mg PO daily for 63 months. Recent CT scan of chest, abdomen, and pelvis shows no concerning findings. Lab results are unremarkable. No evidence of disease progression. - Continue Tagrisso  80 mg PO daily - Schedule follow-up in 3 months with lab work, no scan next time  Bilateral hip bursitis Bilateral hip pain  attributed to bursitis as per orthopedic evaluation. No evidence from recent imaging to suggest bone metastasis. - Pain management with Tylenol  or ibuprofen  as needed  Chronic constipation Recent episode of bloating evaluated at urgent care, attributed to chronic constipation.  Myalgia of lower extremities Complaints of calf pain and myalgia. Not attributed to Tagrisso  as it is not a common side effect. Low likelihood of blood clots as per previous evaluation. Possible contribution from increased physical activity such as playing pickleball. - Consider dry needling for symptom relief  Mild hepatic steatosis Incidental finding of mild hepatic steatosis on recent imaging. No immediate clinical concern. She is on Crestor  for hyperlipidemia, which may contribute to fatty liver.  Hyperlipidemia On Crestor  for management of hyperlipidemia. Discussed the potential benefit of CT cardiac scoring due to high cholesterol. - Schedule CT cardiac scoring at [facility] She was advised to call immediately if she has any other concerning symptoms in the interval.  The patient voices understanding of current disease status and treatment options and is in agreement with the current care plan. All questions were answered. The patient knows to call the clinic with any problems, questions or concerns. We can certainly see the patient much sooner if necessary. The total time  spent in the appointment was 20 minutes.  Disclaimer: This note was dictated with voice recognition software. Similar sounding words can inadvertently be transcribed and may not be corrected upon review.

## 2024-02-27 ENCOUNTER — Other Ambulatory Visit: Payer: Self-pay

## 2024-02-27 NOTE — Progress Notes (Signed)
 Specialty Pharmacy Refill Coordination Note  [name] West is a 73 y.o. female contacted today regarding refills of specialty medication(s) Osimertinib  Mesylate (TAGRISSO )   Patient requested Delivery   Delivery date: 02/29/24   Verified address: [address] [place], [address]   Medication will be filled on 02/28/24.

## 2024-03-05 DIAGNOSIS — M545 Low back pain, unspecified: Secondary | ICD-10-CM | POA: Diagnosis not present

## 2024-03-05 DIAGNOSIS — R262 Difficulty in walking, not elsewhere classified: Secondary | ICD-10-CM | POA: Diagnosis not present

## 2024-03-06 DIAGNOSIS — H2513 Age-related nuclear cataract, bilateral: Secondary | ICD-10-CM | POA: Diagnosis not present

## 2024-03-06 DIAGNOSIS — H5213 Myopia, bilateral: Secondary | ICD-10-CM | POA: Diagnosis not present

## 2024-03-06 DIAGNOSIS — H25013 Cortical age-related cataract, bilateral: Secondary | ICD-10-CM | POA: Diagnosis not present

## 2024-03-06 DIAGNOSIS — H04123 Dry eye syndrome of bilateral lacrimal glands: Secondary | ICD-10-CM | POA: Diagnosis not present

## 2024-03-07 ENCOUNTER — Encounter: Payer: Self-pay | Admitting: Internal Medicine

## 2024-03-11 DIAGNOSIS — Z23 Encounter for immunization: Secondary | ICD-10-CM | POA: Diagnosis not present

## 2024-03-13 ENCOUNTER — Other Ambulatory Visit (HOSPITAL_BASED_OUTPATIENT_CLINIC_OR_DEPARTMENT_OTHER): Payer: Self-pay | Admitting: Family Medicine

## 2024-03-13 DIAGNOSIS — I2510 Atherosclerotic heart disease of native coronary artery without angina pectoris: Secondary | ICD-10-CM

## 2024-03-16 DIAGNOSIS — Z23 Encounter for immunization: Secondary | ICD-10-CM | POA: Diagnosis not present

## 2024-03-21 ENCOUNTER — Encounter (INDEPENDENT_AMBULATORY_CARE_PROVIDER_SITE_OTHER): Payer: Self-pay

## 2024-03-21 ENCOUNTER — Other Ambulatory Visit: Payer: Self-pay

## 2024-03-22 ENCOUNTER — Other Ambulatory Visit: Payer: Self-pay

## 2024-03-22 ENCOUNTER — Other Ambulatory Visit (HOSPITAL_COMMUNITY): Payer: Self-pay

## 2024-03-22 NOTE — Progress Notes (Signed)
 Specialty Pharmacy Refill Coordination Note  MyChart Questionnaire Submission  [name] West is a 73 y.o. female contacted today regarding refills of specialty medication(s) Tagrisso .  Doses on hand: (Patient-Rptd) 16   Patient requested: (Patient-Rptd) Delivery   Delivery date: 03/26/24  Verified address: [address] [place]  [address]  Medication will be filled on 03/25/24.

## 2024-03-28 ENCOUNTER — Ambulatory Visit

## 2024-03-29 ENCOUNTER — Ambulatory Visit
Admission: RE | Admit: 2024-03-29 | Discharge: 2024-03-29 | Disposition: A | Discharge status: Home / Self Care | Source: Ambulatory Visit | Attending: Family Medicine | Admitting: Family Medicine

## 2024-03-29 DIAGNOSIS — Z1231 Encounter for screening mammogram for malignant neoplasm of breast: Secondary | ICD-10-CM | POA: Diagnosis not present

## 2024-04-01 DIAGNOSIS — R150 Incomplete defecation: Secondary | ICD-10-CM | POA: Diagnosis not present

## 2024-04-01 DIAGNOSIS — Z681 Body mass index (BMI) 19 or less, adult: Secondary | ICD-10-CM | POA: Diagnosis not present

## 2024-04-01 DIAGNOSIS — K5904 Chronic idiopathic constipation: Secondary | ICD-10-CM | POA: Diagnosis not present

## 2024-04-05 ENCOUNTER — Ambulatory Visit (HOSPITAL_BASED_OUTPATIENT_CLINIC_OR_DEPARTMENT_OTHER)
Admission: RE | Admit: 2024-04-05 | Discharge: 2024-04-05 | Disposition: A | Discharge status: Home / Self Care | Payer: Self-pay | Source: Ambulatory Visit | Attending: Family Medicine | Admitting: Family Medicine

## 2024-04-05 DIAGNOSIS — I2510 Atherosclerotic heart disease of native coronary artery without angina pectoris: Secondary | ICD-10-CM | POA: Insufficient documentation

## 2024-04-25 ENCOUNTER — Other Ambulatory Visit: Payer: Self-pay | Admitting: Physician Assistant

## 2024-04-25 ENCOUNTER — Other Ambulatory Visit: Payer: Self-pay

## 2024-04-25 ENCOUNTER — Other Ambulatory Visit (HOSPITAL_COMMUNITY): Payer: Self-pay

## 2024-04-25 DIAGNOSIS — C3491 Malignant neoplasm of unspecified part of right bronchus or lung: Secondary | ICD-10-CM

## 2024-04-25 MED ORDER — OSIMERTINIB MESYLATE 80 MG PO TABS
ORAL_TABLET | Freq: Every day | ORAL | 2 refills | Status: AC
  Filled 2024-04-25: qty 30, 30d supply, fill #0
  Filled 2024-05-23 – 2024-05-24 (×2): qty 30, 30d supply, fill #1
  Filled 2024-06-18: qty 30, 30d supply, fill #2
  Filled 2024-07-19: qty 30, 30d supply, fill #3

## 2024-04-25 NOTE — Progress Notes (Signed)
 Specialty Pharmacy Refill Coordination Note  MyChart Questionnaire Submission  [name] West is a 73 y.o. female contacted today regarding refills of specialty medication(s) Tagrisso .  Doses on hand: (Patient-Rptd) 10   Patient requested: (Patient-Rptd) Delivery   Delivery date: 05/01/24  Verified address: [address] [place]  [address]  Medication will be filled on 04/30/24.   This fill date is pending response to refill request from provider. Patient is aware and if they have not received fill by intended date, they must follow up with pharmacy.

## 2024-04-29 ENCOUNTER — Other Ambulatory Visit: Payer: Self-pay

## 2024-05-23 ENCOUNTER — Other Ambulatory Visit (HOSPITAL_COMMUNITY): Payer: Self-pay

## 2024-05-24 ENCOUNTER — Other Ambulatory Visit: Payer: Self-pay

## 2024-05-24 ENCOUNTER — Other Ambulatory Visit (HOSPITAL_COMMUNITY): Payer: Self-pay

## 2024-05-24 NOTE — Progress Notes (Unsigned)
 [name] West

## 2024-05-24 NOTE — Progress Notes (Signed)
 Specialty Pharmacy Refill Coordination Note  MyChart Questionnaire Submission  [name] West is a 73 y.o. female contacted today regarding refills of specialty medication(s) Tagrisso .  Doses on hand: (Patient-Rptd) 11   Patient requested: (Patient-Rptd) Delivery   Delivery date: 05/31/24  Verified address: [address] [place] [address]  Medication will be filled on 05/30/24

## 2024-05-27 ENCOUNTER — Inpatient Hospital Stay

## 2024-05-27 ENCOUNTER — Inpatient Hospital Stay: Admitting: Physician Assistant

## 2024-05-29 ENCOUNTER — Inpatient Hospital Stay: Admitting: Internal Medicine

## 2024-05-29 ENCOUNTER — Inpatient Hospital Stay: Attending: Internal Medicine

## 2024-05-29 VITALS — BP 119/74 | HR 80 | Temp 97.7°F | Resp 18 | Wt 97.0 lb

## 2024-05-29 DIAGNOSIS — R918 Other nonspecific abnormal finding of lung field: Secondary | ICD-10-CM | POA: Insufficient documentation

## 2024-05-29 DIAGNOSIS — R634 Abnormal weight loss: Secondary | ICD-10-CM | POA: Diagnosis not present

## 2024-05-29 DIAGNOSIS — Z902 Acquired absence of lung [part of]: Secondary | ICD-10-CM | POA: Insufficient documentation

## 2024-05-29 DIAGNOSIS — Z79899 Other long term (current) drug therapy: Secondary | ICD-10-CM | POA: Insufficient documentation

## 2024-05-29 DIAGNOSIS — C3490 Malignant neoplasm of unspecified part of unspecified bronchus or lung: Secondary | ICD-10-CM

## 2024-05-29 DIAGNOSIS — C3411 Malignant neoplasm of upper lobe, right bronchus or lung: Secondary | ICD-10-CM | POA: Insufficient documentation

## 2024-05-29 DIAGNOSIS — C3491 Malignant neoplasm of unspecified part of right bronchus or lung: Secondary | ICD-10-CM

## 2024-05-29 LAB — CBC WITH DIFFERENTIAL (CANCER CENTER ONLY)
Abs Immature Granulocytes: 0.02 K/uL (ref 0.00–0.07)
Basophils Absolute: 0 K/uL (ref 0.0–0.1)
Basophils Relative: 1 %
Eosinophils Absolute: 0.1 K/uL (ref 0.0–0.5)
Eosinophils Relative: 1 %
HCT: 40.3 % (ref 36.0–46.0)
Hemoglobin: 13.4 g/dL (ref 12.0–15.0)
Immature Granulocytes: 0 %
Lymphocytes Relative: 20 %
Lymphs Abs: 1.2 K/uL (ref 0.7–4.0)
MCH: 30.5 pg (ref 26.0–34.0)
MCHC: 33.3 g/dL (ref 30.0–36.0)
MCV: 91.6 fL (ref 80.0–100.0)
Monocytes Absolute: 0.8 K/uL (ref 0.1–1.0)
Monocytes Relative: 13 %
Neutro Abs: 3.9 K/uL (ref 1.7–7.7)
Neutrophils Relative %: 65 %
Platelet Count: 193 K/uL (ref 150–400)
RBC: 4.4 MIL/uL (ref 3.87–5.11)
RDW: 13 % (ref 11.5–15.5)
WBC Count: 6 K/uL (ref 4.0–10.5)
nRBC: 0 % (ref 0.0–0.2)

## 2024-05-29 LAB — CMP (CANCER CENTER ONLY)
ALT: 20 U/L (ref 0–44)
AST: 32 U/L (ref 15–41)
Albumin: 4.5 g/dL (ref 3.5–5.0)
Alkaline Phosphatase: 51 U/L (ref 38–126)
Anion gap: 9 (ref 5–15)
BUN: 25 mg/dL — ABNORMAL HIGH (ref 8–23)
CO2: 29 mmol/L (ref 22–32)
Calcium: 9.4 mg/dL (ref 8.9–10.3)
Chloride: 100 mmol/L (ref 98–111)
Creatinine: 1.02 mg/dL — ABNORMAL HIGH (ref 0.44–1.00)
GFR, Estimated: 57 mL/min — ABNORMAL LOW (ref >60)
Glucose, Bld: 90 mg/dL (ref 70–99)
Potassium: 4.4 mmol/L (ref 3.5–5.1)
Sodium: 137 mmol/L (ref 135–145)
Total Bilirubin: 0.3 mg/dL (ref 0.0–1.2)
Total Protein: 6.6 g/dL (ref 6.5–8.1)

## 2024-05-29 NOTE — Progress Notes (Signed)
 [facility] [phone]) [phone]   [phone]  OFFICE PROGRESS NOTE  [name] Harvey, MD [address] [place] [address]  DIAGNOSIS: Recurrent non-small cell lung cancer initially diagnosed as stage IIIA (T2a, N2, M0) non-small cell lung cancer, adenocarcinoma with positive EGFR mutation with deletion in exon 19, presented with right upper lobe lung mass and mediastinal lymphadenopathy, status post surgical resection February 2017.  PRIOR THERAPY: 1) Right VATS with right upper lobectomy with bronchoplasty closure and en bloc resection of a wedge from the superior segment of the right lower lobe in addition to mediastinal lymph node dissection on 07/30/2015 under the care of Dr. Kerrin. 2) Adjuvant systemic chemotherapy with cisplatin  75 MG/M2 and Alimta  500 MG/M2 every 3 weeks. First dose was given on 09/10/2015. Status post 4 cycles. 3) adjuvant radiotherapy under the care of Dr. Patrcia.  CURRENT THERAPY: Tagrisso  80 mg p.o. daily started 12/01/2018.  Status post 66 months of treatment.  INTERVAL HISTORY: [name] West 73 y.o. female returns to the clinic today for 80-month follow-up visit. Discussed the use of AI scribe software for clinical note transcription with the patient, who gave verbal consent to proceed.  History of Present Illness [name] West is a 73 year old female with stage IV EGFR-mutant lung adenocarcinoma who presents for routine oncology follow-up and repeat blood work.  She has been maintained on osimertinib  80 mg orally once daily for approximately five and a half years, with ongoing disease surveillance.  She reports unintentional weight loss despite preserved appetite and regular dietary intake, stating that she loses weight regardless of consumption. She has increased physical activity, including weekly volunteering and playing pickleball, which may contribute to her weight changes.  She denies chest pain,  dyspnea, nausea, vomiting, diarrhea, or rash. She endorses dry skin and changes to her fingers, describing them as falling apart.  She is aware of mildly elevated serum creatinine and BUN on recent laboratory studies. She expresses interest in long-term data collection regarding osimertinib  use. She has not had recent thyroid  function testing.    MEDICAL HISTORY: Past Medical History:  Diagnosis Date   Anxiety    Arthritis    shoulders   Cancer (HCC) 06/2014   Lung Cancer   Complication of anesthesia    nausea, does well with PROPOFOL     Cough 09/17/2015   Encounter for antineoplastic chemotherapy 10/22/2015   Environmental and seasonal allergies    Family history of adverse reaction to anesthesia    Patients grandmother died while having hand surgery; pt unsure of cause   Oral thrush 10/22/2015   Ovarian cyst    PONV (postoperative nausea and vomiting)    Raynaud disease     ALLERGIES:  is allergic to avelox [moxifloxacin hcl in nacl], cephalexin, cephalosporins, ciprofloxacin  hcl, clindamycin/lincomycin, shrimp [shellfish allergy], macrobid [nitrofurantoin monohyd macro], omnipaque  [iohexol ], azithromycin , fluconazole, moxifloxacin, adhesive [tape], lidoderm  [lidocaine ], pseudoephedrine, and sulfa antibiotics.  MEDICATIONS:  Current Outpatient Medications  Medication Sig Dispense Refill   cholecalciferol  (VITAMIN D ) 1000 UNITS tablet Take 1,000 Units by mouth 3 (three) times a week.     citalopram  (CELEXA ) 10 MG tablet Take 10 mg by mouth daily.     clobetasol ointment (TEMOVATE) 0.05 % 1 application     Clobetasol Prop Oint-Coal Tar (CLOBETAPLUS OINTMENT EX) Apply 1 Dose topically daily.     EPINEPHrine 0.3 mg/0.3 mL IJ SOAJ injection Inject into the muscle as directed. (Patient not taking: Reported on 08/31/2023)  estradiol (ESTRACE) 0.1 MG/GM vaginal cream Place 1 Applicatorful vaginally 2 (two) times a week.     ibuprofen  (ADVIL ,MOTRIN ) 200 MG tablet Take 200 mg by mouth  every 6 (six) hours as needed (pain). (Patient not taking: Reported on 08/31/2023)     levocetirizine (XYZAL) 5 MG tablet Take 2.5 mg by mouth every evening.     LORazepam  (ATIVAN ) 0.5 MG tablet Take 0.5 mg by mouth at bedtime as needed for anxiety or sleep.  (Patient not taking: Reported on 08/31/2023)     Multiple Vitamin (MULTIVITAMIN WITH MINERALS) TABS Take 1 tablet by mouth daily.     NONFORMULARY OR COMPOUNDED ITEM Place 1 application vaginally 2 (two) times a week. Estradiol 0.02% Cream     osimertinib  mesylate (TAGRISSO ) 80 MG tablet TAKE 1 TABLET (80 MG TOTAL) BY MOUTH DAILY. 60 tablet 2   polyethylene glycol powder (GLYCOLAX /MIRALAX ) 17 GM/SCOOP powder Take by mouth as needed.     predniSONE  (DELTASONE ) 50 MG tablet TAKE 1 TABLET BY MOUTH 13 HOURS PRIOR TO CT, 1 TABLET 7 HOURS PRIOR & 1 TABLET 1 HOUR PRIOR TO CT 3 tablet 5   Probiotic Product (ALIGN) 4 MG CAPS Take 1 capsule by mouth daily.     rosuvastatin  (CRESTOR ) 5 MG tablet TAKE 1 TABLET (5 MG TOTAL) BY MOUTH DAILY. 90 tablet 3   Wheat Dextrin (BENEFIBER) POWD Take by mouth daily at 6 (six) AM.     No current facility-administered medications for this visit.    SURGICAL HISTORY:  Past Surgical History:  Procedure Laterality Date   BREAST EXCISIONAL BIOPSY Left    1996   CESAREAN SECTION  1984   COLONOSCOPY WITH PROPOFOL  N/A 09/27/2012   Procedure: COLONOSCOPY WITH PROPOFOL ;  Surgeon: Gladis MARLA Louder, MD;  Location: [facility];  Service: Endoscopy;  Laterality: N/A;   Fibroid Removed     LAPAROTOMY Left 07/20/2016   Procedure: MINI LAPAROTOMY, REMOVAL OF LEFT OVARIAN CYST, LEFT SALPINGO-OOPHORECTOMY;  Surgeon: Rosaline Cobble, MD;  Location: [facility];  Service: Gynecology;  Laterality: Left;  please moved to 7:30am if spot opens   Right Rotator Cuff Right    TOTAL SHOULDER ARTHROPLASTY Left 06/14/2018   Procedure: TOTAL SHOULDER ARTHROPLASTY;  Surgeon: Melita Drivers, MD;  Location: [facility];  Service: Orthopedics;  Laterality:  Left;    VIDEO ASSISTED THORACOSCOPY (VATS)/ LOBECTOMY Right 07/30/2015   Procedure: RIGHT VIDEO ASSISTED THORACOSCOPY (VATS)/RIGHT UPPER LOBECTOMY;  Surgeon: Elspeth JAYSON Millers, MD;  Location: [facility] OR;  Service: Thoracic;  Laterality: Right;   VIDEO BRONCHOSCOPY WITH ENDOBRONCHIAL NAVIGATION N/A 07/15/2015   Procedure: VIDEO BRONCHOSCOPY WITH ENDOBRONCHIAL NAVIGATION with Biopsy;  Surgeon: Lamar GORMAN Chris, MD;  Location: [facility] OR;  Service: Thoracic;  Laterality: N/A;    REVIEW OF SYSTEMS:  A comprehensive review of systems was negative.   PHYSICAL EXAMINATION: General appearance: alert, cooperative, and no distress Head: Normocephalic, without obvious abnormality, atraumatic Neck: no adenopathy, no JVD, supple, symmetrical, trachea midline, and thyroid  not enlarged, symmetric, no tenderness/mass/nodules Lymph nodes: Cervical, supraclavicular, and axillary nodes normal. Resp: clear to auscultation bilaterally Back: symmetric, no curvature. ROM normal. No CVA tenderness. Cardio: regular rate and rhythm, S1, S2 normal, no murmur, click, rub or gallop GI: soft, non-tender; bowel sounds normal; no masses,  no organomegaly Extremities: extremities normal, atraumatic, no cyanosis or edema  ECOG PERFORMANCE STATUS: 0 - Asymptomatic  Pulse 80, temperature 97.7 F (36.5 C), temperature source Oral, resp. rate 18, weight 97 lb 0.2 oz (44 kg), SpO2 99%.  LABORATORY DATA: Lab Results  Component Value Date   WBC 6.0 05/29/2024   HGB 13.4 05/29/2024   HCT 40.3 05/29/2024   MCV 91.6 05/29/2024   PLT 193 05/29/2024      Chemistry      Component Value Date/Time   NA 137 05/29/2024 0848   NA 139 06/16/2017 1132   K 4.4 05/29/2024 0848   K 4.7 06/16/2017 1132   CL 100 05/29/2024 0848   CO2 29 05/29/2024 0848   CO2 28 06/16/2017 1132   BUN 25 (H) 05/29/2024 0848   BUN 19.5 06/16/2017 1132   CREATININE 1.02 (H) 05/29/2024 0848   CREATININE 0.8 06/16/2017 1132      Component Value  Date/Time   CALCIUM  9.4 05/29/2024 0848   CALCIUM  10.0 06/16/2017 1132   ALKPHOS 51 05/29/2024 0848   ALKPHOS 68 06/16/2017 1132   AST 32 05/29/2024 0848   AST 21 06/16/2017 1132   ALT 20 05/29/2024 0848   ALT 18 06/16/2017 1132   BILITOT 0.3 05/29/2024 0848   BILITOT 0.50 06/16/2017 1132       RADIOGRAPHIC STUDIES: No results found.    ASSESSMENT AND PLAN:  This is a very pleasant 73 years old white female with recurrent non-small cell lung cancer initially diagnosed as stage IIIA non-small cell lung cancer, adenocarcinoma with positive EGFR mutation in exon 19 status post right upper lobectomy with lymph node dissection followed by adjuvant systemic chemotherapy as well as adjuvant radiation. The patient has been on observation for almost 3 years.   Unfortunately her scan in June 2020 showed multiple tiny pulmonary nodules scattered throughout both lungs which many of them have increased in size and some new nodule suspected the previous scan.  The patient started treatment with Tagrisso  80 mg p.o. daily status post 66 months of treatment.   She continues to tolerate this treatment fairly well with no concerning adverse effects. 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 She has been maintained on osimertinib  for over five years, remains clinically well without evidence of disease progression, and tolerates therapy with minimal side effects. Continued osimertinib  is indicated due to risk of disease flare upon discontinuation, as supported by prior studies, and absence of significant toxicity. She expressed interest in data collection regarding long-term osimertinib  use but prefers to continue therapy. - Continued osimertinib  80 mg orally once daily. - Scheduled follow-up in three months with  imaging.  Adverse effects of antineoplastic therapy She experiences ongoing unintentional weight loss, xerosis, and digital changes, recognized as potential adverse effects of osimertinib . Despite these, she tolerates therapy well with no new or severe toxicities. - Encouraged hydration to address xerosis and support renal function.  Abnormal blood chemistry findings Mildly elevated serum creatinine and BUN, likely attributable to increased physical activity. No evidence of significant renal dysfunction or other concerning laboratory abnormalities. - Encouraged increased oral hydration. - Planned monitoring of renal function on subsequent laboratory assessments. She was advised to call immediately if she has any other concerning symptoms in the interval.   The patient voices understanding of current disease status and treatment options and is in agreement with the current care plan. All questions were answered. The patient knows to call the clinic with any problems, questions or concerns. We can certainly see the patient much sooner if necessary. The total time spent in the appointment was 20 minutes.  Disclaimer: This note was dictated with voice recognition software. Similar sounding words can inadvertently be transcribed and may not be corrected upon review.

## 2024-06-11 ENCOUNTER — Other Ambulatory Visit: Payer: Self-pay

## 2024-06-14 ENCOUNTER — Other Ambulatory Visit (HOSPITAL_COMMUNITY): Payer: Self-pay

## 2024-06-14 ENCOUNTER — Telehealth: Payer: Self-pay

## 2024-06-14 NOTE — Telephone Encounter (Signed)
 Oral Oncology Patient Advocate Encounter  Was successful in securing patient a $6000 grant from [facility] to provide copayment coverage for Tagrisso .  This will keep the out of pocket expense at $0.     [employer] ID: [id number]   The billing information is as follows and has been shared with [facility].    RxBin: [id number] PCN: [id number] Member ID: [id number] Group ID: [id number] Dates of Eligibility: 05/15/24 through 05/14/25  Fund:  NSCLC  Lucie Lamer, CPhT [place]  [facility] Oncology Pharmacy Patient Advocate Specialist II [facility]ER[place] [place] Phone: [phone]  Fax: [phone] [email or web address]

## 2024-06-17 ENCOUNTER — Encounter: Payer: Self-pay | Admitting: Internal Medicine

## 2024-06-18 ENCOUNTER — Other Ambulatory Visit: Payer: Self-pay

## 2024-06-18 NOTE — Progress Notes (Signed)
 Specialty Pharmacy Ongoing Clinical Assessment Note  [name] West is a 74 y.o. female who is being followed by the specialty pharmacy service for RxSp Oncology   Patient's specialty medication(s) reviewed today: Osimertinib  Mesylate (TAGRISSO )   Missed doses in the last 4 weeks: 0   Patient/Caregiver did not have any additional questions or concerns.   Therapeutic benefit summary: Patient is achieving benefit   Adverse events/side effects summary: Experienced adverse events/side effects (brittle nails but manageable)   Patient's therapy is appropriate to: Continue    Goals Addressed             This Visit's Progress    Slow Disease Progression   On track    Patient is on track. Patient will maintain adherence. Patient remains stable at this time.          Follow up: 6 months  [facility]

## 2024-06-18 NOTE — Progress Notes (Signed)
 Specialty Pharmacy Refill Coordination Note  [name] West is a 74 y.o. female contacted today regarding refills of specialty medication(s) Osimertinib  Mesylate (TAGRISSO )   Patient requested Delivery   Delivery date: 06/24/24   Verified address: [address]   [place] [address]   Medication will be filled on: 06/21/24

## 2024-06-21 ENCOUNTER — Other Ambulatory Visit: Payer: Self-pay

## 2024-07-19 ENCOUNTER — Other Ambulatory Visit: Payer: Self-pay

## 2024-07-19 NOTE — Progress Notes (Signed)
 Specialty Pharmacy Refill Coordination Note  [name] West is a 74 y.o. female contacted today regarding refills of specialty medication(s) Osimertinib  Mesylate (TAGRISSO )   Patient requested Delivery   Delivery date: 07/26/24   Verified address: [address] [place] [address]   Medication will be filled on: 07/25/24

## 2024-08-19 ENCOUNTER — Other Ambulatory Visit (HOSPITAL_COMMUNITY)

## 2024-08-19 ENCOUNTER — Inpatient Hospital Stay: Attending: Internal Medicine

## 2024-08-22 ENCOUNTER — Inpatient Hospital Stay: Admitting: Internal Medicine
